# Patient Record
Sex: Female | Born: 1935 | Race: White | Hispanic: No | Smoking: Former smoker
Health system: Southern US, Community
[De-identification: ages and names within clinical notes are randomized; demographics above are authoritative.]

## PROBLEM LIST (undated history)

## (undated) DIAGNOSIS — J189 Pneumonia, unspecified organism: Secondary | ICD-10-CM

## (undated) DIAGNOSIS — T8859XA Other complications of anesthesia, initial encounter: Secondary | ICD-10-CM

## (undated) DIAGNOSIS — R112 Nausea with vomiting, unspecified: Secondary | ICD-10-CM

## (undated) DIAGNOSIS — E079 Disorder of thyroid, unspecified: Secondary | ICD-10-CM

## (undated) DIAGNOSIS — M51369 Other intervertebral disc degeneration, lumbar region without mention of lumbar back pain or lower extremity pain: Secondary | ICD-10-CM

## (undated) DIAGNOSIS — I499 Cardiac arrhythmia, unspecified: Secondary | ICD-10-CM

## (undated) DIAGNOSIS — J40 Bronchitis, not specified as acute or chronic: Secondary | ICD-10-CM

## (undated) DIAGNOSIS — R609 Edema, unspecified: Secondary | ICD-10-CM

## (undated) DIAGNOSIS — R351 Nocturia: Secondary | ICD-10-CM

## (undated) DIAGNOSIS — Z8719 Personal history of other diseases of the digestive system: Secondary | ICD-10-CM

## (undated) DIAGNOSIS — T4145XA Adverse effect of unspecified anesthetic, initial encounter: Secondary | ICD-10-CM

## (undated) DIAGNOSIS — Z9889 Other specified postprocedural states: Secondary | ICD-10-CM

## (undated) DIAGNOSIS — T7840XA Allergy, unspecified, initial encounter: Secondary | ICD-10-CM

## (undated) DIAGNOSIS — D649 Anemia, unspecified: Secondary | ICD-10-CM

## (undated) DIAGNOSIS — G709 Myoneural disorder, unspecified: Secondary | ICD-10-CM

## (undated) DIAGNOSIS — M62830 Muscle spasm of back: Secondary | ICD-10-CM

## (undated) DIAGNOSIS — I2510 Atherosclerotic heart disease of native coronary artery without angina pectoris: Secondary | ICD-10-CM

## (undated) DIAGNOSIS — E039 Hypothyroidism, unspecified: Secondary | ICD-10-CM

## (undated) DIAGNOSIS — F988 Other specified behavioral and emotional disorders with onset usually occurring in childhood and adolescence: Secondary | ICD-10-CM

## (undated) DIAGNOSIS — Z8489 Family history of other specified conditions: Secondary | ICD-10-CM

## (undated) DIAGNOSIS — M1990 Unspecified osteoarthritis, unspecified site: Secondary | ICD-10-CM

## (undated) DIAGNOSIS — E785 Hyperlipidemia, unspecified: Secondary | ICD-10-CM

## (undated) DIAGNOSIS — M5136 Other intervertebral disc degeneration, lumbar region: Secondary | ICD-10-CM

## (undated) HISTORY — DX: Nocturia: R35.1

## (undated) HISTORY — PX: ABDOMINAL HYSTERECTOMY: SHX81

## (undated) HISTORY — DX: Edema, unspecified: R60.9

## (undated) HISTORY — PX: JOINT REPLACEMENT: SHX530

## (undated) HISTORY — DX: Disorder of thyroid, unspecified: E07.9

## (undated) HISTORY — DX: Myoneural disorder, unspecified: G70.9

## (undated) HISTORY — DX: Allergy, unspecified, initial encounter: T78.40XA

## (undated) HISTORY — DX: Other specified behavioral and emotional disorders with onset usually occurring in childhood and adolescence: F98.8

## (undated) HISTORY — PX: CHOLECYSTECTOMY: SHX55

## (undated) HISTORY — PX: FOOT SURGERY: SHX648

## (undated) HISTORY — PX: PARTIAL HYSTERECTOMY: SHX80

## (undated) HISTORY — PX: EYE SURGERY: SHX253

## (undated) HISTORY — PX: COLONOSCOPY: SHX174

## (undated) HISTORY — PX: HAND SURGERY: SHX662

---

## 2011-02-19 ENCOUNTER — Encounter: Payer: Self-pay | Admitting: Podiatry

## 2011-02-19 DIAGNOSIS — E079 Disorder of thyroid, unspecified: Secondary | ICD-10-CM | POA: Insufficient documentation

## 2011-02-19 DIAGNOSIS — F988 Other specified behavioral and emotional disorders with onset usually occurring in childhood and adolescence: Secondary | ICD-10-CM

## 2011-02-19 DIAGNOSIS — L989 Disorder of the skin and subcutaneous tissue, unspecified: Secondary | ICD-10-CM

## 2011-02-19 DIAGNOSIS — H9190 Unspecified hearing loss, unspecified ear: Secondary | ICD-10-CM | POA: Insufficient documentation

## 2011-12-21 ENCOUNTER — Encounter (HOSPITAL_COMMUNITY): Payer: Self-pay

## 2011-12-21 ENCOUNTER — Emergency Department (HOSPITAL_COMMUNITY)
Admission: EM | Admit: 2011-12-21 | Discharge: 2011-12-21 | Disposition: A | Discharge status: Home / Self Care | Payer: Medicare Other | Attending: Emergency Medicine | Admitting: Emergency Medicine

## 2011-12-21 DIAGNOSIS — R21 Rash and other nonspecific skin eruption: Secondary | ICD-10-CM

## 2011-12-21 DIAGNOSIS — M2550 Pain in unspecified joint: Secondary | ICD-10-CM

## 2011-12-21 DIAGNOSIS — Z79899 Other long term (current) drug therapy: Secondary | ICD-10-CM | POA: Insufficient documentation

## 2011-12-21 DIAGNOSIS — F988 Other specified behavioral and emotional disorders with onset usually occurring in childhood and adolescence: Secondary | ICD-10-CM | POA: Insufficient documentation

## 2011-12-21 DIAGNOSIS — E079 Disorder of thyroid, unspecified: Secondary | ICD-10-CM | POA: Insufficient documentation

## 2011-12-21 DIAGNOSIS — H9190 Unspecified hearing loss, unspecified ear: Secondary | ICD-10-CM | POA: Insufficient documentation

## 2011-12-21 DIAGNOSIS — M25469 Effusion, unspecified knee: Secondary | ICD-10-CM | POA: Insufficient documentation

## 2011-12-21 LAB — DIFFERENTIAL
Basophils Relative: 1 % (ref 0–1)
Eosinophils Relative: 4 % (ref 0–5)
Lymphs Abs: 0.9 10*3/uL (ref 0.7–4.0)
Monocytes Absolute: 0.5 10*3/uL (ref 0.1–1.0)
Monocytes Relative: 9 % (ref 3–12)

## 2011-12-21 LAB — COMPREHENSIVE METABOLIC PANEL
ALT: 12 U/L (ref 0–35)
AST: 16 U/L (ref 0–37)
CO2: 22 mEq/L (ref 19–32)
Calcium: 9 mg/dL (ref 8.4–10.5)
Chloride: 106 mEq/L (ref 96–112)
Creatinine, Ser: 0.81 mg/dL (ref 0.50–1.10)
GFR calc Af Amer: 79 mL/min — ABNORMAL LOW (ref >90)
GFR calc non Af Amer: 68 mL/min — ABNORMAL LOW (ref >90)
Glucose, Bld: 91 mg/dL (ref 70–99)
Total Bilirubin: 0.3 mg/dL (ref 0.3–1.2)

## 2011-12-21 LAB — CBC
HCT: 33.9 % — ABNORMAL LOW (ref 36.0–46.0)
Hemoglobin: 12.2 g/dL (ref 12.0–15.0)
MCH: 31.9 pg (ref 26.0–34.0)
MCV: 88.7 fL (ref 78.0–100.0)
RBC: 3.82 MIL/uL — ABNORMAL LOW (ref 3.87–5.11)
WBC: 5.2 10*3/uL (ref 4.0–10.5)

## 2011-12-21 MED ORDER — DOXYCYCLINE HYCLATE 100 MG PO CAPS: 100.0000 mg | ORAL_CAPSULE | Freq: Two times a day (BID) | ORAL | Status: AC

## 2011-12-21 MED ORDER — HYDROCODONE-ACETAMINOPHEN 5-325 MG PO TABS: 1.0000 | ORAL_TABLET | ORAL | Status: AC | PRN

## 2011-12-21 MED ORDER — DOXYCYCLINE HYCLATE 100 MG IV SOLR
100.0000 mg | Freq: Once | INTRAVENOUS | Status: AC
  Administered 2011-12-21: 100 mg via INTRAVENOUS
  Filled 2011-12-21 (×2): qty 100

## 2011-12-21 MED ORDER — IBUPROFEN 200 MG PO TABS
ORAL_TABLET | ORAL | Status: AC
  Administered 2011-12-21: 600 mg
  Filled 2011-12-21: qty 3

## 2011-12-21 MED ORDER — SODIUM CHLORIDE 0.9 % IV BOLUS (SEPSIS)
500.0000 mL | Freq: Once | INTRAVENOUS | Status: AC
  Administered 2011-12-21: 500 mL via INTRAVENOUS

## 2011-12-21 MED ORDER — MORPHINE SULFATE 4 MG/ML IJ SOLN
4.0000 mg | Freq: Once | INTRAMUSCULAR | Status: DC
  Filled 2011-12-21: qty 1

## 2011-12-21 NOTE — Discharge Instructions (Signed)
FOLLOW UP WITH YOUR DOCTOR FOR RECHECK IN 1-2 DAYS. IF YOU DEVELOP ANY FEVER, HEADACHE, NAUSEA/VOMITING, OR IF THE KNEE BECOMES RED AND WARM TO TOUCH WITH CONTINUED OR WORSE PAIN, RETURN HERE IMMEDIATELY. tAKE MEDICATIONS AS PRESCRIBED.   Arthralgia Your caregiver has diagnosed you as suffering from an arthralgia. Arthralgia means there is pain in a joint. This can come from many reasons including:  Bruising the joint which causes soreness (inflammation) in the joint.   Wear and tear on the joints which occur as we grow older (osteoarthritis).   Overusing the joint.   Various forms of arthritis.   Infections of the joint.  Regardless of the cause of pain in your joint, most of these different pains respond to anti-inflammatory drugs and rest. The exception to this is when a joint is infected, and these cases are treated with antibiotics, if it is a bacterial infection. HOME CARE INSTRUCTIONS   Rest the injured area for as long as directed by your caregiver. Then slowly start using the joint as directed by your caregiver and as the pain allows. Crutches as directed may be useful if the ankles, knees or hips are involved. If the knee was splinted or casted, continue use and care as directed. If an stretchy or elastic wrapping bandage has been applied today, it should be removed and re-applied every 3 to 4 hours. It should not be applied tightly, but firmly enough to keep swelling down. Watch toes and feet for swelling, bluish discoloration, coldness, numbness or excessive pain. If any of these problems (symptoms) occur, remove the ace bandage and re-apply more loosely. If these symptoms persist, contact your caregiver or return to this location.   For the first 24 hours, keep the injured extremity elevated on pillows while lying down.   Apply ice for 15 to 20 minutes to the sore joint every couple hours while awake for the first half day. Then 3 to 4 times per day for the first 48 hours. Put the  ice in a plastic bag and place a towel between the bag of ice and your skin.   Wear any splinting, casting, elastic bandage applications, or slings as instructed.   Only take over-the-counter or prescription medicines for pain, discomfort, or fever as directed by your caregiver. Do not use aspirin immediately after the injury unless instructed by your physician. Aspirin can cause increased bleeding and bruising of the tissues.   If you were given crutches, continue to use them as instructed and do not resume weight bearing on the sore joint until instructed.  Persistent pain and inability to use the sore joint as directed for more than 2 to 3 days are warning signs indicating that you should see a caregiver for a follow-up visit as soon as possible. Initially, a hairline fracture (break in bone) may not be evident on X-rays. Persistent pain and swelling indicate that further evaluation, non-weight bearing or use of the joint (use of crutches or slings as instructed), or further X-rays are indicated. X-rays may sometimes not show a small fracture until a week or 10 days later. Make a follow-up appointment with your own caregiver or one to whom we have referred you. A radiologist (specialist in reading X-rays) may read your X-rays. Make sure you know how you are to obtain your X-ray results. Do not assume everything is normal if you do not hear from [place]. SEEK MEDICAL CARE IF: Bruising, swelling, or pain increases. SEEK IMMEDIATE MEDICAL CARE IF:   Your  fingers or toes are numb or blue.   The pain is not responding to medications and continues to stay the same or get worse.   The pain in your joint becomes severe.   You develop a fever over 102 F (38.9 C).   It becomes impossible to move or use the joint.  MAKE SURE YOU:   Understand these instructions.   Will watch your condition.   Will get help right away if you are not doing well or get worse.  Document Released: 07/08/2005 Document  Revised: 06/27/2011 Document Reviewed: 02/24/2008 [facility] Patient Information 2012 [place], [place].

## 2011-12-21 NOTE — Progress Notes (Signed)
Orthopedic Tech Progress Note Patient Details:  Cheryl Martin 1930-12-24 [id number]  Ortho Devices Type of Ortho Device: Crutches Ortho Device/Splint Location: (L) LE Ortho Device/Splint Interventions: Casandra Doffing 12/21/2011, 5:51 PM

## 2011-12-21 NOTE — ED Notes (Signed)
Pt sts she did take a new probiotic last pm.

## 2011-12-21 NOTE — ED Provider Notes (Signed)
Medical screening examination/treatment/procedure(s) were conducted as a shared visit with non-physician practitioner(s) and myself.  I personally evaluated the patient during the encounter    Nelia Shi, MD 12/21/11 [phone]

## 2011-12-21 NOTE — Progress Notes (Signed)
Orthopedic Tech Progress Note Patient Details:  Cheryl Martin [birth year] [id number]  Ortho Devices Type of Ortho Device: Knee Immobilizer Ortho Device/Splint Location: (L) LE Ortho Device/Splint Interventions: Application   Jennye Moccasin 12/21/2011, 4:38 PM

## 2011-12-21 NOTE — ED Notes (Signed)
Pt complains of joint swelling and raash all over since last pm.

## 2011-12-21 NOTE — ED Notes (Signed)
Pt was received to [facility] 1 with c/i swelling to BLE with rash onset yesterday. Pt  Denies any SOB, no chest pain, no dizziness. Pt denies having long travel.. Pt is also complaining of swelling to both hands. NAD

## 2011-12-21 NOTE — ED Provider Notes (Addendum)
History   This chart was scribed for Cheryl Quarry, MD by Toya Smothers. The patient was seen in room STRE1/STRE1. Patient's care was started at 1209.  CSN: [id number]  Arrival date & time 12/21/11  1209   First MD Initiated Contact with Patient 12/21/11 1221     Chief Complaint  Patient presents with  . Joint Swelling  . Rash   Patient is a 78 y.o. female presenting with rash. The history is provided by the patient. No language interpreter was used.  Rash  This is a new problem. The current episode started 12 to 24 hours ago. The problem has been gradually worsening. The problem is associated with nothing. There has been no fever. Associated symptoms comments: She developed rash "all over" and joint pain last night. No fever, vomiting, headache. She reports she does a lot of gardening but not known tick bites. Cheryl Martin is a 78 y.o. female who presents to the Emergency Department complaining of constant moderate severe sudden onset leg swelling onset 14 hours ago with associate rash denying fever. Pt states that it may be a possible tic bite.  Past Medical History  Diagnosis Date  . ADD (attention deficit disorder)   . Hearing loss   . Thyroid disease     Past Surgical History  Procedure Date  . Colonoscopy   . Eye surgery   . Partial hysterectomy   . Hand surgery    No family history on file.  History  Substance Use Topics  . Smoking status: Unknown If Ever Smoked  . Smokeless tobacco: Not on file  . Alcohol Use: No    Review of Systems  Constitutional: Negative for fever.  HENT: Negative for neck pain.   Respiratory: Negative for cough.   Cardiovascular: Negative for chest pain.  Gastrointestinal: Negative for vomiting and abdominal pain.  Musculoskeletal: Positive for joint swelling.  Skin: Positive for rash.  Neurological: Negative for headaches.  Psychiatric/Behavioral: Negative for confusion.    Allergies  Review of patient's allergies  indicates no known allergies.  Home Medications   Current Outpatient Rx  Name Route Sig Dispense Refill  . KRILL OIL PO Oral Take 1 tablet by mouth daily.    . METHYLPHENIDATE HCL 10 MG PO TABS Oral Take 10 mg by mouth 2 (two) times daily. Take along with 5mg  tablet to equal 15mg  total.    . METHYLPHENIDATE HCL 5 MG PO TABS Oral Take 5 mg by mouth 2 (two) times daily. Take with 10mg  tablet to equal 15mg  total dose.    . ADULT MULTIVITAMIN W/MINERALS CH Oral Take 1 tablet by mouth daily.    Marland Kitchen PROBIOTIC PO Oral Take 1 tablet by mouth daily.      BP 107/54  Pulse 79  Temp(Src) 97.7 F (36.5 C) (Oral)  Resp 16  SpO2 97%  Physical Exam  Constitutional: She appears well-developed and well-nourished. No distress.  HENT:  Head: Normocephalic.  Neck: Normal range of motion.  Cardiovascular: Normal rate.   No murmur heard. Pulmonary/Chest: Effort normal. She has no wheezes. She has no rales.  Abdominal: Soft. There is no tenderness.  Musculoskeletal: Normal range of motion.       Left knee swelling without redness or warmth. FROM. Tender.   Skin: Skin is warm and dry.       Macular rash that blanches and is generalized.   Psychiatric: She has a normal mood and affect.    ED Course  Procedures (including critical care time) Results for orders placed during the hospital encounter of 12/21/11  CBC      Component Value Range   WBC 5.2  4.0 - 10.5 (K/uL)   RBC 3.82 (*) 3.87 - 5.11 (MIL/uL)   Hemoglobin 12.2  12.0 - 15.0 (g/dL)   HCT 16.1 (*) 09.6 - 46.0 (%)   MCV 88.7  78.0 - 100.0 (fL)   MCH 31.9  26.0 - 34.0 (pg)   MCHC 36.0  30.0 - 36.0 (g/dL)   RDW 04.5  40.9 - 81.1 (%)   Platelets 212  150 - 400 (K/uL)  DIFFERENTIAL      Component Value Range   Neutrophils Relative 68  43 - 77 (%)   Lymphocytes Relative 18  12 - 46 (%)   Monocytes Relative 9  3 - 12 (%)   Eosinophils Relative 4  0 - 5 (%)   Basophils Relative 1  0 - 1 (%)   Neutro Abs 3.5  1.7 - 7.7 (K/uL)   Lymphs Abs  0.9  0.7 - 4.0 (K/uL)   Monocytes Absolute 0.5  0.1 - 1.0 (K/uL)   Eosinophils Absolute 0.2  0.0 - 0.7 (K/uL)   Basophils Absolute 0.1  0.0 - 0.1 (K/uL)   Smear Review MORPHOLOGY UNREMARKABLE    COMPREHENSIVE METABOLIC PANEL      Component Value Range   Sodium 138  135 - 145 (mEq/L)   Potassium 3.6  3.5 - 5.1 (mEq/L)   Chloride 106  96 - 112 (mEq/L)   CO2 22  19 - 32 (mEq/L)   Glucose, Bld 91  70 - 99 (mg/dL)   BUN 9  6 - 23 (mg/dL)   Creatinine, Ser 9.14  0.50 - 1.10 (mg/dL)   Calcium 9.0  8.4 - 78.2 (mg/dL)   Total Protein 5.9 (*) 6.0 - 8.3 (g/dL)   Albumin 3.6  3.5 - 5.2 (g/dL)   AST 16  0 - 37 (U/L)   ALT 12  0 - 35 (U/L)   Alkaline Phosphatase 86  39 - 117 (U/L)   Total Bilirubin 0.3  0.3 - 1.2 (mg/dL)   GFR calc non Af Amer 68 (*) >90 (mL/min)   GFR calc Af Amer 79 (*) >90 (mL/min)  .  DIAGNOSTIC STUDIES: Oxygen Saturation is 97% on room air, normal by my interpretation.    COORDINATION OF CARE:  Labs Reviewed - No data to display No results found.  No diagnosis found. 1. Arthralgia 2. Rash  MDM  The presentation of arthralgias with typical macular rash is concerning for tick borne illness, despite no fever, headache or vomiting history. Doxycycline given, pain medication. Lab work is unremarkable and Dr. Radford Pax has co-evaluated patient. Feel she is stable for discharge home and is encouraged to follow up with her doctor for recheck in 2 days.        Rodena Medin, PA-C 12/21/11 1507  Rodena Medin, PA-C 12/21/11 1615

## 2011-12-21 NOTE — ED Notes (Signed)
PA at bedside.

## 2012-06-24 ENCOUNTER — Other Ambulatory Visit: Payer: Self-pay | Admitting: Family Medicine

## 2012-06-24 ENCOUNTER — Ambulatory Visit
Admission: RE | Admit: 2012-06-24 | Discharge: 2012-06-24 | Disposition: A | Discharge status: Home / Self Care | Payer: 59 | Source: Ambulatory Visit | Attending: Family Medicine | Admitting: Family Medicine

## 2012-06-24 ENCOUNTER — Ambulatory Visit
Admission: RE | Admit: 2012-06-24 | Discharge: 2012-06-24 | Disposition: A | Discharge status: Home / Self Care | Payer: Medicare Other | Source: Ambulatory Visit | Attending: Family Medicine | Admitting: Family Medicine

## 2012-06-24 DIAGNOSIS — R102 Pelvic and perineal pain: Secondary | ICD-10-CM

## 2014-07-26 DIAGNOSIS — H9313 Tinnitus, bilateral: Secondary | ICD-10-CM | POA: Diagnosis not present

## 2014-07-26 DIAGNOSIS — L659 Nonscarring hair loss, unspecified: Secondary | ICD-10-CM | POA: Diagnosis not present

## 2014-07-26 DIAGNOSIS — R41840 Attention and concentration deficit: Secondary | ICD-10-CM | POA: Diagnosis not present

## 2014-08-03 DIAGNOSIS — J189 Pneumonia, unspecified organism: Secondary | ICD-10-CM | POA: Diagnosis not present

## 2014-08-03 DIAGNOSIS — F900 Attention-deficit hyperactivity disorder, predominantly inattentive type: Secondary | ICD-10-CM | POA: Diagnosis not present

## 2014-08-03 DIAGNOSIS — J13 Pneumonia due to Streptococcus pneumoniae: Secondary | ICD-10-CM | POA: Diagnosis not present

## 2014-08-03 DIAGNOSIS — R41840 Attention and concentration deficit: Secondary | ICD-10-CM | POA: Diagnosis not present

## 2014-09-02 DIAGNOSIS — H903 Sensorineural hearing loss, bilateral: Secondary | ICD-10-CM | POA: Diagnosis not present

## 2014-09-02 DIAGNOSIS — H9313 Tinnitus, bilateral: Secondary | ICD-10-CM | POA: Diagnosis not present

## 2014-09-02 DIAGNOSIS — H9113 Presbycusis, bilateral: Secondary | ICD-10-CM | POA: Diagnosis not present

## 2014-09-19 DIAGNOSIS — L821 Other seborrheic keratosis: Secondary | ICD-10-CM | POA: Diagnosis not present

## 2014-09-19 DIAGNOSIS — D1801 Hemangioma of skin and subcutaneous tissue: Secondary | ICD-10-CM | POA: Diagnosis not present

## 2014-09-19 DIAGNOSIS — L820 Inflamed seborrheic keratosis: Secondary | ICD-10-CM | POA: Diagnosis not present

## 2014-09-19 DIAGNOSIS — D2262 Melanocytic nevi of left upper limb, including shoulder: Secondary | ICD-10-CM | POA: Diagnosis not present

## 2014-10-24 DIAGNOSIS — H04123 Dry eye syndrome of bilateral lacrimal glands: Secondary | ICD-10-CM | POA: Diagnosis not present

## 2014-10-24 DIAGNOSIS — H3562 Retinal hemorrhage, left eye: Secondary | ICD-10-CM | POA: Diagnosis not present

## 2014-10-24 DIAGNOSIS — H1859 Other hereditary corneal dystrophies: Secondary | ICD-10-CM | POA: Diagnosis not present

## 2014-10-24 DIAGNOSIS — H35372 Puckering of macula, left eye: Secondary | ICD-10-CM | POA: Diagnosis not present

## 2014-11-01 DIAGNOSIS — H35372 Puckering of macula, left eye: Secondary | ICD-10-CM | POA: Diagnosis not present

## 2014-11-02 DIAGNOSIS — H35372 Puckering of macula, left eye: Secondary | ICD-10-CM | POA: Diagnosis not present

## 2014-11-02 DIAGNOSIS — H547 Unspecified visual loss: Secondary | ICD-10-CM | POA: Diagnosis not present

## 2014-11-10 DIAGNOSIS — H35372 Puckering of macula, left eye: Secondary | ICD-10-CM | POA: Diagnosis not present

## 2014-11-14 DIAGNOSIS — F900 Attention-deficit hyperactivity disorder, predominantly inattentive type: Secondary | ICD-10-CM | POA: Diagnosis not present

## 2014-12-22 DIAGNOSIS — H35372 Puckering of macula, left eye: Secondary | ICD-10-CM | POA: Diagnosis not present

## 2014-12-22 DIAGNOSIS — Z09 Encounter for follow-up examination after completed treatment for conditions other than malignant neoplasm: Secondary | ICD-10-CM | POA: Diagnosis not present

## 2015-02-13 DIAGNOSIS — F909 Attention-deficit hyperactivity disorder, unspecified type: Secondary | ICD-10-CM | POA: Diagnosis not present

## 2015-02-13 DIAGNOSIS — E782 Mixed hyperlipidemia: Secondary | ICD-10-CM | POA: Diagnosis not present

## 2015-02-13 DIAGNOSIS — Z5181 Encounter for therapeutic drug level monitoring: Secondary | ICD-10-CM | POA: Diagnosis not present

## 2015-02-13 DIAGNOSIS — I2510 Atherosclerotic heart disease of native coronary artery without angina pectoris: Secondary | ICD-10-CM | POA: Diagnosis not present

## 2015-02-13 DIAGNOSIS — G894 Chronic pain syndrome: Secondary | ICD-10-CM | POA: Diagnosis not present

## 2015-02-16 ENCOUNTER — Emergency Department (INDEPENDENT_AMBULATORY_CARE_PROVIDER_SITE_OTHER)
Admission: EM | Admit: 2015-02-16 | Discharge: 2015-02-16 | Disposition: A | Discharge status: Home / Self Care | Payer: Medicare Other | Source: Home / Self Care

## 2015-02-16 ENCOUNTER — Encounter (HOSPITAL_COMMUNITY): Payer: Self-pay | Admitting: Emergency Medicine

## 2015-02-16 DIAGNOSIS — M1712 Unilateral primary osteoarthritis, left knee: Secondary | ICD-10-CM | POA: Diagnosis not present

## 2015-02-16 MED ORDER — TRAMADOL HCL 50 MG PO TABS: 50.0000 mg | ORAL_TABLET | Freq: Four times a day (QID) | ORAL | Status: DC | PRN

## 2015-02-16 NOTE — ED Provider Notes (Signed)
CSN: [id number]     Arrival date & time 02/16/15  1427 History   None    Chief Complaint  Patient presents with  . Leg Pain   (Consider location/radiation/quality/duration/timing/severity/associated sxs/prior Treatment)  HPI   The patient is an 81 year old female presenting today with complaints of increased L knee pain for the past 2-3 days.  The patient states she has had knee trouble for "years", but that it has been increasingly uncomfortable over the past few days.  Patient denies any known injury. Denies any significant medical history. Her only medications are adderal. No known allergies.  Past Medical History  Diagnosis Date  . ADD (attention deficit disorder)   . Hearing loss   . Thyroid disease    Past Surgical History  Procedure Laterality Date  . Colonoscopy    . Eye surgery    . Partial hysterectomy    . Hand surgery     History reviewed. No pertinent family history. History  Substance Use Topics  . Smoking status: Unknown If Ever Smoked  . Smokeless tobacco: Not on file  . Alcohol Use: No   OB History    No data available     Review of Systems  Constitutional: Negative.  Negative for fever and chills.  HENT: Negative.  Negative for sinus pressure, sneezing and sore throat.   Eyes: Negative.   Respiratory: Negative for cough, chest tightness, shortness of breath and wheezing.   Cardiovascular: Negative.  Negative for chest pain.  Gastrointestinal: Negative.   Endocrine: Negative.   Genitourinary: Negative.   Musculoskeletal: Positive for gait problem. Negative for neck pain and neck stiffness.       L Knee pain.   Skin: Negative.  Negative for pallor and rash.  Allergic/Immunologic: Negative for environmental allergies, food allergies and immunocompromised state.  Neurological: Negative.   Hematological: Negative.   Psychiatric/Behavioral: Negative.     Allergies  Review of patient's allergies indicates no known allergies.  Home Medications    Prior to Admission medications   Medication Sig Start Date End Date Taking? Authorizing Provider  KRILL OIL PO Take 1 tablet by mouth daily.    Historical Provider, MD  methylphenidate (RITALIN) 10 MG tablet Take 10 mg by mouth 2 (two) times daily. Take along with 5mg  tablet to equal 15mg  total.    Historical Provider, MD  methylphenidate (RITALIN) 5 MG tablet Take 5 mg by mouth 2 (two) times daily. Take with 10mg  tablet to equal 15mg  total dose.    Historical Provider, MD  Multiple Vitamin (MULITIVITAMIN WITH MINERALS) TABS Take 1 tablet by mouth daily.    Historical Provider, MD  Probiotic Product (PROBIOTIC PO) Take 1 tablet by mouth daily.    Historical Provider, MD  traMADol (ULTRAM) 50 MG tablet Take 1 tablet (50 mg total) by mouth every 6 (six) hours as needed. 02/16/15   Nehemiah Settle, NP   BP 177/80 mmHg  Pulse 78  Temp(Src) 97.5 F (36.4 C) (Oral)  Resp 16  SpO2 98%   Physical Exam  Constitutional: She appears well-developed and well-nourished. No distress.  Eyes: Conjunctivae are normal. Pupils are equal, round, and reactive to light. Right eye exhibits no discharge. Left eye exhibits no discharge. No scleral icterus.  Neck: Normal range of motion. Neck supple. No thyromegaly present.  Cardiovascular: Normal rate, regular rhythm, normal heart sounds and intact distal pulses.  Exam reveals no gallop and no friction rub.   No murmur heard. Bilateral dependent/pedal edema present.  2+ pedal pulses bilaterally.  CMS intact and cap refill <3 seconds.   Pulmonary/Chest: Effort normal and breath sounds normal. No respiratory distress. She has no wheezes. She has no rales. She exhibits no tenderness.  Musculoskeletal: Normal range of motion. She exhibits edema and tenderness.       Left knee: She exhibits swelling and effusion. She exhibits normal range of motion, no ecchymosis, no deformity, no laceration, no erythema, normal alignment, no LCL laxity, normal patellar mobility and  no bony tenderness. Tenderness found.       Legs: Crepitus noted with flexion and extension.  States history of "popping" knee when she was younger.  Scar noted over knee from fall in the 1950s.  Skin: She is not diaphoretic.  Nursing note and vitals reviewed.   ED Course  Procedures (including critical care time) Labs Review Labs Reviewed - No data to display  Imaging Review No results found.   MDM   1. Primary osteoarthritis of left knee    Meds ordered this encounter  Medications  . traMADol (ULTRAM) 50 MG tablet    Sig: Take 1 tablet (50 mg total) by mouth every 6 (six) hours as needed.    Dispense:  10 tablet    Refill:  0    Plan of care was discussed with Dr. Juventino Slovak.  The patient placed in knee immobilizer and to follow up with [facility] for further evaluation. The patient verbalizes understanding and agrees to plan of care.      Nehemiah Settle, NP 02/16/15 1812

## 2015-02-16 NOTE — ED Notes (Signed)
C/o left leg pain States she has some swelling Denies any injury States she pcp two days ago and was told electrolytes was low to drink Gatorade Does have hx of arthritis

## 2015-02-16 NOTE — Discharge Instructions (Signed)
Arthritis, Nonspecific °Arthritis is inflammation of a joint. This usually means pain, redness, warmth or swelling are present. One or more joints may be involved. There are a number of types of arthritis. Your caregiver may not be able to tell what type of arthritis you have right away. °CAUSES  °The most common cause of arthritis is the wear and tear on the joint (osteoarthritis). This causes damage to the cartilage, which can break down over time. The knees, hips, back and neck are most often affected by this type of arthritis. °Other types of arthritis and common causes of joint pain include: °· Sprains and other injuries near the joint. Sometimes minor sprains and injuries cause pain and swelling that develop hours later. °· Rheumatoid arthritis. This affects hands, feet and knees. It usually affects both sides of your body at the same time. It is often associated with chronic ailments, fever, weight loss and general weakness. °· Crystal arthritis. Gout and pseudo gout can cause occasional acute severe pain, redness and swelling in the foot, ankle, or knee. °· Infectious arthritis. Bacteria can get into a joint through a break in overlying skin. This can cause infection of the joint. Bacteria and viruses can also spread through the blood and affect your joints. °· Drug, infectious and allergy reactions. Sometimes joints can become mildly painful and slightly swollen with these types of illnesses. °SYMPTOMS  °· Pain is the main symptom. °· Your joint or joints can also be red, swollen and warm or hot to the touch. °· You may have a fever with certain types of arthritis, or even feel overall ill. °· The joint with arthritis will hurt with movement. Stiffness is present with some types of arthritis. °DIAGNOSIS  °Your caregiver will suspect arthritis based on your description of your symptoms and on your exam. Testing may be needed to find the type of arthritis: °· Blood and sometimes urine tests. °· X-ray tests  and sometimes CT or MRI scans. °· Removal of fluid from the joint (arthrocentesis) is done to check for bacteria, crystals or other causes. Your caregiver (or a specialist) will numb the area over the joint with a local anesthetic, and use a needle to remove joint fluid for examination. This procedure is only minimally uncomfortable. °· Even with these tests, your caregiver may not be able to tell what kind of arthritis you have. Consultation with a specialist (rheumatologist) may be helpful. °TREATMENT  °Your caregiver will discuss with you treatment specific to your type of arthritis. If the specific type cannot be determined, then the following general recommendations may apply. °Treatment of severe joint pain includes: °· Rest. °· Elevation. °· Anti-inflammatory medication (for example, ibuprofen) may be prescribed. Avoiding activities that cause increased pain. °· Only take over-the-counter or prescription medicines for pain and discomfort as recommended by your caregiver. °· Cold packs over an inflamed joint may be used for 10 to 15 minutes every hour. Hot packs sometimes feel better, but do not use overnight. Do not use hot packs if you are diabetic without your caregiver's permission. °· A cortisone shot into arthritic joints may help reduce pain and swelling. °· Any acute arthritis that gets worse over the next 1 to 2 days needs to be looked at to be sure there is no joint infection. °Long-term arthritis treatment involves modifying activities and lifestyle to reduce joint stress jarring. This can include weight loss. Also, exercise is needed to nourish the joint cartilage and remove waste. This helps keep the muscles   around the joint strong. °HOME CARE INSTRUCTIONS  °· Do not take aspirin to relieve pain if gout is suspected. This elevates uric acid levels. °· Only take over-the-counter or prescription medicines for pain, discomfort or fever as directed by your caregiver. °· Rest the joint as much as  possible. °· If your joint is swollen, keep it elevated. °· Use crutches if the painful joint is in your leg. °· Drinking plenty of fluids may help for certain types of arthritis. °· Follow your caregiver's dietary instructions. °· Try low-impact exercise such as: °¨ Swimming. °¨ Water aerobics. °¨ Biking. °¨ Walking. °· Morning stiffness is often relieved by a warm shower. °· Put your joints through regular range-of-motion. °SEEK MEDICAL CARE IF:  °· You do not feel better in 24 hours or are getting worse. °· You have side effects to medications, or are not getting better with treatment. °SEEK IMMEDIATE MEDICAL CARE IF:  °· You have a fever. °· You develop severe joint pain, swelling or redness. °· Many joints are involved and become painful and swollen. °· There is severe back pain and/or leg weakness. °· You have loss of bowel or bladder control. °Document Released: 08/15/2004 Document Revised: 09/30/2011 Document Reviewed: 08/31/2008 °[employer]® Patient Information ©2015 [employer]. This information is not intended to replace advice given to you by your health care provider. Make sure you discuss any questions you have with your health care provider. ° °

## 2015-02-23 DIAGNOSIS — H35372 Puckering of macula, left eye: Secondary | ICD-10-CM | POA: Diagnosis not present

## 2015-02-23 DIAGNOSIS — M1712 Unilateral primary osteoarthritis, left knee: Secondary | ICD-10-CM | POA: Diagnosis not present

## 2015-03-14 ENCOUNTER — Other Ambulatory Visit: Payer: Self-pay | Admitting: *Deleted

## 2015-03-14 DIAGNOSIS — I872 Venous insufficiency (chronic) (peripheral): Secondary | ICD-10-CM

## 2015-03-14 DIAGNOSIS — I739 Peripheral vascular disease, unspecified: Secondary | ICD-10-CM

## 2015-04-06 DIAGNOSIS — M1712 Unilateral primary osteoarthritis, left knee: Secondary | ICD-10-CM | POA: Diagnosis not present

## 2015-04-17 ENCOUNTER — Encounter: Payer: Self-pay | Admitting: Vascular Surgery

## 2015-04-19 ENCOUNTER — Ambulatory Visit (INDEPENDENT_AMBULATORY_CARE_PROVIDER_SITE_OTHER)
Admission: RE | Admit: 2015-04-19 | Discharge: 2015-04-19 | Disposition: A | Discharge status: Home / Self Care | Payer: Medicare Other | Source: Ambulatory Visit | Attending: Vascular Surgery | Admitting: Vascular Surgery

## 2015-04-19 ENCOUNTER — Ambulatory Visit (HOSPITAL_COMMUNITY)
Admission: RE | Admit: 2015-04-19 | Discharge: 2015-04-19 | Disposition: A | Discharge status: Home / Self Care | Payer: Medicare Other | Source: Ambulatory Visit | Attending: Vascular Surgery | Admitting: Vascular Surgery

## 2015-04-19 ENCOUNTER — Encounter: Payer: Self-pay | Admitting: Vascular Surgery

## 2015-04-19 ENCOUNTER — Ambulatory Visit (INDEPENDENT_AMBULATORY_CARE_PROVIDER_SITE_OTHER): Payer: Medicare Other | Admitting: Vascular Surgery

## 2015-04-19 VITALS — BP 134/67 | HR 72 | Ht 64.0 in | Wt 143.7 lb

## 2015-04-19 DIAGNOSIS — I872 Venous insufficiency (chronic) (peripheral): Secondary | ICD-10-CM | POA: Insufficient documentation

## 2015-04-19 DIAGNOSIS — I739 Peripheral vascular disease, unspecified: Secondary | ICD-10-CM | POA: Insufficient documentation

## 2015-04-19 NOTE — Progress Notes (Signed)
[facility] [place]  Patient name: Cheryl Martin MRN: [id number] DOB: April 02, 1931 Sex: female  REASON FOR CONSULT: Bilateral lower extremity swelling. Referred by Dr. Noemi Chapel.  HPI: Cheryl Martin is a 81 y.o. female, who is Married, and who presents with bilateral lower extremity swelling. This has been present for many months. Her symptoms are aggravated by standing and relieved somewhat with elevation. She is unaware of any history of DVT or phlebitis. She does have osteoarthritis of her left knee and does have pain associated with this. There is no significant pain associated with her lower leg swelling.  I have reviewed the records that were sent from Dr. Archie Endo office. She presented with pain and swelling of her left knee with no injury associated with this. She was felt to have some left knee osteoarthritis. She also had changes bilaterally consistent with venous stasis and was sent for vascular consultation.  Past Medical History  Diagnosis Date  . ADD (attention deficit disorder)   . Hearing loss   . Thyroid disease    Family History  Problem Relation Age of Onset  . Cancer Mother    SOCIAL HISTORY: Social History   Social History  . Marital Status: Married    Spouse Name: N/A  . Number of Children: N/A  . Years of Education: N/A   Occupational History  . Not on file.   Social History Main Topics  . Smoking status: Former Smoker    Quit date: 07/23/1971  . Smokeless tobacco: Not on file  . Alcohol Use: No  . Drug Use: Not on file  . Sexual Activity: Not on file   Other Topics Concern  . Not on file   Social History Narrative   No Known Allergies Current Outpatient Prescriptions  Medication Sig Dispense Refill  . amphetamine-dextroamphetamine (ADDERALL) 20 MG tablet Take 20 mg by mouth daily.    . Ascorbic Acid (VITAMIN C) 1000 MG tablet Take 1,000 mg by mouth daily.    Marland Kitchen FISH OIL-KRILL OIL PO Take by mouth.    . Multiple  Vitamin (MULITIVITAMIN WITH MINERALS) TABS Take 1 tablet by mouth daily.    . VOLTAREN 1 % GEL     . methylphenidate (RITALIN) 10 MG tablet Take 10 mg by mouth 2 (two) times daily. Take along with 5mg  tablet to equal 15mg  total.    . methylphenidate (RITALIN) 5 MG tablet Take 5 mg by mouth 2 (two) times daily. Take with 10mg  tablet to equal 15mg  total dose.    . Probiotic Product (PROBIOTIC PO) Take 1 tablet by mouth daily.    . traMADol (ULTRAM) 50 MG tablet Take 1 tablet (50 mg total) by mouth every 6 (six) hours as needed. (Patient not taking: Reported on 04/19/2015) 10 tablet 0   No current facility-administered medications for this visit.   REVIEW OF SYSTEMS:  [X]  denotes positive finding, [ ]  denotes negative finding Cardiac  Comments:  Chest pain or chest pressure:    Shortness of breath upon exertion:    Short of breath when lying flat:    Irregular heart rhythm:        Vascular    Pain in calf, thigh, or hip brought on by ambulation:    Pain in feet at night that wakes you up from your sleep:   Cramping but no rest pain.  Blood clot in your veins:    Leg swelling:  X       Pulmonary  Oxygen at home:    Productive cough:     Wheezing:         Neurologic    Sudden weakness in arms or legs:     Sudden numbness in arms or legs:     Sudden onset of difficulty speaking or slurred speech:    Temporary loss of vision in one eye:     Problems with dizziness:         Gastrointestinal    Blood in stool:     Vomited blood:         Genitourinary    Burning when urinating:     Blood in urine:        Psychiatric    Major depression:         Hematologic    Bleeding problems:    Problems with blood clotting too easily:        Skin    Rashes or ulcers:        Constitutional    Fever or chills:      PHYSICAL EXAM: Filed Vitals:   04/19/15 1037 04/19/15 1045  BP: 142/67 134/67  Pulse: 72   Height: 5\' 4"  (1.626 m)   Weight: 143 lb 11.2 oz (65.182 kg)   SpO2: 100%     GENERAL: The patient is a well-nourished female, in no acute distress. The vital signs are documented above. CARDIAC: There is a regular rate and rhythm.  VASCULAR: I do not detect carotid bruits. She has palpable femoral pulses and diminished but palpable dorsalis pedis pulses bilaterally. Both feet are warm and well perfused. She has mild bilateral lower extremity swelling. There are no significant varicose veins. PULMONARY: There is good air exchange bilaterally without wheezing or rales. ABDOMEN: Soft and non-tender with normal pitched bowel sounds.  MUSCULOSKELETAL: There are no major deformities or cyanosis. NEUROLOGIC: No focal weakness or paresthesias are detected. SKIN: There are no ulcers or rashes noted. She has mild hyperpigmentation bilaterally consistent with chronic venous insufficiency. PSYCHIATRIC: The patient has a normal affect.  DATA:  LOWER EXTREMITY VENOUS DUPLEX: I have independently interpreted the lower extremely venous duplex scan today.  On the right side, there is no evidence of DVT. There is reflux in the right common femoral vein. There is no reflux in the right great saphenous vein.  On the left side. There is no evidence of DVT. There is reflux in the left common femoral vein and also in the saphenofemoral junction. There is no reflux in the left great saphenous vein.  LOWER EXTREMITY ARTERIAL DOPPLER: I have independently interpreted the lower history arterial Doppler study which was done today and shows triphasic Doppler signals in the posterior tibial and dorsalis pedis positions bilaterally. ABIs are 100% bilaterally.  MEDICAL ISSUES:  CHRONIC VENOUS INSUFFICIENCY: Based on her duplex and exam, she has evidence of mild chronic venous insufficiency bilaterally. We have discussed the importance of intermittent leg elevation in the proper positioning for this. In addition I have given her a prescription for knee-high compression stockings with a gradient of  15-20 mmHg. I've encouraged her to stay active and to avoid prolonged sitting and standing. I'll be happy to see her back at anytime if her swelling progresses or she develops any new vascular issues. I reassured her that she has excellent arterial flow.   Deitra Mayo [facility] [place]: [phone]

## 2015-05-03 DIAGNOSIS — H04123 Dry eye syndrome of bilateral lacrimal glands: Secondary | ICD-10-CM | POA: Diagnosis not present

## 2015-05-03 DIAGNOSIS — Z961 Presence of intraocular lens: Secondary | ICD-10-CM | POA: Diagnosis not present

## 2015-05-03 DIAGNOSIS — H1859 Other hereditary corneal dystrophies: Secondary | ICD-10-CM | POA: Diagnosis not present

## 2015-06-05 DIAGNOSIS — R634 Abnormal weight loss: Secondary | ICD-10-CM | POA: Diagnosis not present

## 2015-06-05 DIAGNOSIS — R05 Cough: Secondary | ICD-10-CM | POA: Diagnosis not present

## 2015-06-09 ENCOUNTER — Ambulatory Visit
Admission: RE | Admit: 2015-06-09 | Discharge: 2015-06-09 | Disposition: A | Discharge status: Home / Self Care | Payer: Medicare Other | Source: Ambulatory Visit | Attending: Family Medicine | Admitting: Family Medicine

## 2015-06-09 ENCOUNTER — Other Ambulatory Visit: Payer: Self-pay | Admitting: Family Medicine

## 2015-06-09 DIAGNOSIS — R634 Abnormal weight loss: Secondary | ICD-10-CM

## 2015-06-09 DIAGNOSIS — J40 Bronchitis, not specified as acute or chronic: Secondary | ICD-10-CM

## 2015-06-09 DIAGNOSIS — R079 Chest pain, unspecified: Secondary | ICD-10-CM | POA: Diagnosis not present

## 2015-06-09 DIAGNOSIS — R0789 Other chest pain: Secondary | ICD-10-CM

## 2015-06-28 DIAGNOSIS — R799 Abnormal finding of blood chemistry, unspecified: Secondary | ICD-10-CM | POA: Diagnosis not present

## 2015-06-28 DIAGNOSIS — K5900 Constipation, unspecified: Secondary | ICD-10-CM | POA: Diagnosis not present

## 2015-06-28 DIAGNOSIS — E039 Hypothyroidism, unspecified: Secondary | ICD-10-CM | POA: Diagnosis not present

## 2015-06-28 DIAGNOSIS — I10 Essential (primary) hypertension: Secondary | ICD-10-CM | POA: Diagnosis not present

## 2015-06-28 DIAGNOSIS — N3281 Overactive bladder: Secondary | ICD-10-CM | POA: Diagnosis not present

## 2015-07-03 DIAGNOSIS — H1859 Other hereditary corneal dystrophies: Secondary | ICD-10-CM | POA: Diagnosis not present

## 2015-07-03 DIAGNOSIS — Z961 Presence of intraocular lens: Secondary | ICD-10-CM | POA: Diagnosis not present

## 2015-07-03 DIAGNOSIS — H04123 Dry eye syndrome of bilateral lacrimal glands: Secondary | ICD-10-CM | POA: Diagnosis not present

## 2015-07-03 DIAGNOSIS — H53001 Unspecified amblyopia, right eye: Secondary | ICD-10-CM | POA: Diagnosis not present

## 2015-07-03 DIAGNOSIS — H35372 Puckering of macula, left eye: Secondary | ICD-10-CM | POA: Diagnosis not present

## 2015-09-18 DIAGNOSIS — H04123 Dry eye syndrome of bilateral lacrimal glands: Secondary | ICD-10-CM | POA: Diagnosis not present

## 2015-09-18 DIAGNOSIS — Z961 Presence of intraocular lens: Secondary | ICD-10-CM | POA: Diagnosis not present

## 2015-09-18 DIAGNOSIS — H1859 Other hereditary corneal dystrophies: Secondary | ICD-10-CM | POA: Diagnosis not present

## 2015-09-18 DIAGNOSIS — H16102 Unspecified superficial keratitis, left eye: Secondary | ICD-10-CM | POA: Diagnosis not present

## 2015-09-18 DIAGNOSIS — H10412 Chronic giant papillary conjunctivitis, left eye: Secondary | ICD-10-CM | POA: Diagnosis not present

## 2015-11-13 DIAGNOSIS — I2510 Atherosclerotic heart disease of native coronary artery without angina pectoris: Secondary | ICD-10-CM | POA: Diagnosis not present

## 2015-11-13 DIAGNOSIS — E782 Mixed hyperlipidemia: Secondary | ICD-10-CM | POA: Diagnosis not present

## 2015-11-13 DIAGNOSIS — F909 Attention-deficit hyperactivity disorder, unspecified type: Secondary | ICD-10-CM | POA: Diagnosis not present

## 2016-02-15 DIAGNOSIS — R41840 Attention and concentration deficit: Secondary | ICD-10-CM | POA: Diagnosis not present

## 2016-02-15 DIAGNOSIS — G459 Transient cerebral ischemic attack, unspecified: Secondary | ICD-10-CM | POA: Diagnosis not present

## 2016-02-17 ENCOUNTER — Other Ambulatory Visit: Payer: Self-pay | Admitting: Family Medicine

## 2016-02-17 DIAGNOSIS — G459 Transient cerebral ischemic attack, unspecified: Secondary | ICD-10-CM

## 2016-02-21 ENCOUNTER — Encounter: Payer: Self-pay | Admitting: Family Medicine

## 2016-02-21 ENCOUNTER — Ambulatory Visit (INDEPENDENT_AMBULATORY_CARE_PROVIDER_SITE_OTHER): Payer: Medicare Other | Admitting: Family Medicine

## 2016-02-21 DIAGNOSIS — F988 Other specified behavioral and emotional disorders with onset usually occurring in childhood and adolescence: Secondary | ICD-10-CM

## 2016-02-21 DIAGNOSIS — E559 Vitamin D deficiency, unspecified: Secondary | ICD-10-CM

## 2016-02-21 DIAGNOSIS — H9193 Unspecified hearing loss, bilateral: Secondary | ICD-10-CM

## 2016-02-21 DIAGNOSIS — F909 Attention-deficit hyperactivity disorder, unspecified type: Secondary | ICD-10-CM | POA: Diagnosis not present

## 2016-02-21 DIAGNOSIS — Z90710 Acquired absence of both cervix and uterus: Secondary | ICD-10-CM | POA: Diagnosis not present

## 2016-02-21 DIAGNOSIS — E785 Hyperlipidemia, unspecified: Secondary | ICD-10-CM

## 2016-02-21 DIAGNOSIS — E079 Disorder of thyroid, unspecified: Secondary | ICD-10-CM

## 2016-02-21 DIAGNOSIS — R5382 Chronic fatigue, unspecified: Secondary | ICD-10-CM

## 2016-02-21 DIAGNOSIS — E538 Deficiency of other specified B group vitamins: Secondary | ICD-10-CM

## 2016-02-21 NOTE — Progress Notes (Signed)
New patient office visit note:  Impression and Recommendations:    1. ADD (attention deficit disorder)   2. Status post hysterectomy    3. thyroid disease- at time did not warrant txmnt   4. Vitamin D deficiency   5. Decreased hearing, bilateral   6. h/o Hyperlipidemia, mild   7. h/o low B12- on supp   8. Chronic fatigue      Since medication is not having any detrimental effects,  and patient claims it makes a significant difference in her ability to focus, complete tasks at home, and just feels overall more in control of her life, explained patient I'm okay with refilling that as needed in the future  Due to age and past medical history, we discussed it is doubtful that she will need any more Pap smears in the future  Check thyroid levels at upcoming lab draw  We'll check vitamin D levels    patient declines referral for hearing eval.  We'll obtain yearly health maintenance blood work in the future and include vitamin D, B12.    Patient's Medications  New Prescriptions   No medications on file  Previous Medications   AMPHETAMINE-DEXTROAMPHETAMINE (ADDERALL) 20 MG TABLET    Take 20 mg by mouth 2 (two) times daily.   ASCORBIC ACID (VITAMIN C) 1000 MG TABLET    Take 1,000 mg by mouth daily.   CARNITINE 250 MG CAPS    Take 2 capsules by mouth daily.   CHOLECALCIFEROL (VITAMIN D3) 2000 UNITS TABS    Take 1 capsule by mouth daily.   FISH OIL-KRILL OIL PO    Take by mouth.   MISC NATURAL PRODUCTS (TART CHERRY ADVANCED PO)    Take 1,200 mg by mouth daily.   MULTIPLE VITAMIN (MULITIVITAMIN WITH MINERALS) TABS    Take 1 tablet by mouth daily.   TURMERIC 500 MG CAPS    Take 1 tablet by mouth daily.  Modified Medications   No medications on file  Discontinued Medications   AMPHETAMINE-DEXTROAMPHETAMINE (ADDERALL) 20 MG TABLET    Take 20 mg by mouth daily.   METHYLPHENIDATE (RITALIN) 10 MG TABLET    Take 10 mg by mouth 2 (two) times daily. Take along with 5mg  tablet to  equal 15mg  total.   METHYLPHENIDATE (RITALIN) 5 MG TABLET    Take 5 mg by mouth 2 (two) times daily. Take with 10mg  tablet to equal 15mg  total dose.   PROBIOTIC PRODUCT (PROBIOTIC PO)    Take 1 tablet by mouth daily.   TRAMADOL (ULTRAM) 50 MG TABLET    Take 1 tablet (50 mg total) by mouth every 6 (six) hours as needed.   VOLTAREN 1 % GEL        Return in about 4 weeks (around 03/20/2016) for Fasting blood work, For wellness exam & health maintenance evaluation.  The patient was counseled, risk factors were discussed, anticipatory guidance given.  Gross side effects, risk and benefits, and alternatives of medications discussed with patient.  Patient is aware that all medications have potential side effects and we are unable to predict every side effect or drug-drug interaction that may occur.  Expresses verbal understanding and consents to current therapy plan and treatment regimen.  Please see AVS handed out to patient at the end of our visit for further patient instructions/ counseling done pertaining to today's office visit.    Note: This document was prepared using Dragon voice recognition software and may include unintentional dictation errors.  ----------------------------------------------------------------------------------------------------------------------  Subjective:    No chief complaint on file.   HPI: Cheryl Martin is an 82 y.o. female who presents to [place] at [facility] today to review their medical history with me and establish care.     Pt of dr little- many yrs.  Looking for a change.    Dr Zadie Rhine- Optho- double vision L eye. Sx repair of "a macular wrinkle."    Last blood work was at least a yr ago.    2015 patient had a normal Pap smear and is not sexually active. Also she had a normal mammogram at that time. She denies any family history of breast cancer or female cancers of any kind.   Patient Active Problem List   Diagnosis  Date Noted  . h/o Hyperlipidemia, mild 02/24/2016  . h/o low B12- on supp 02/24/2016  . Gen chronic Fatigue 02/24/2016  . Vitamin D deficiency 02/22/2016  . Status post hysterectomy  02/21/2016  . ADD (attention deficit disorder) 02/19/2011  . Decreased hearing 02/19/2011  . thyroid disease- at time did not warrant txmnt 02/19/2011     Past Medical History:  Diagnosis Date  . ADD (attention deficit disorder)   . Allergy   . Edema   . Hearing loss   . Neuromuscular disorder ([place])   . Nocturia   . Thyroid disease      Past Surgical History:  Procedure Laterality Date  . ABDOMINAL HYSTERECTOMY    . CHOLECYSTECTOMY    . COLONOSCOPY    . EYE SURGERY    . HAND SURGERY    . PARTIAL HYSTERECTOMY       Family History  Problem Relation Age of Onset  . Cancer Brother     stomach, colon     History  Drug Use No    History  Alcohol Use No    History  Smoking Status  . Former Smoker  . Quit date: 07/23/1971  Smokeless Tobacco  . Never Used    Patient's Medications  New Prescriptions   No medications on file  Previous Medications   AMPHETAMINE-DEXTROAMPHETAMINE (ADDERALL) 20 MG TABLET    Take 20 mg by mouth 2 (two) times daily.   ASCORBIC ACID (VITAMIN C) 1000 MG TABLET    Take 1,000 mg by mouth daily.   CARNITINE 250 MG CAPS    Take 2 capsules by mouth daily.   CHOLECALCIFEROL (VITAMIN D3) 2000 UNITS TABS    Take 1 capsule by mouth daily.   FISH OIL-KRILL OIL PO    Take by mouth.   MISC NATURAL PRODUCTS (TART CHERRY ADVANCED PO)    Take 1,200 mg by mouth daily.   MULTIPLE VITAMIN (MULITIVITAMIN WITH MINERALS) TABS    Take 1 tablet by mouth daily.   TURMERIC 500 MG CAPS    Take 1 tablet by mouth daily.  Modified Medications   No medications on file  Discontinued Medications   AMPHETAMINE-DEXTROAMPHETAMINE (ADDERALL) 20 MG TABLET    Take 20 mg by mouth daily.   METHYLPHENIDATE (RITALIN) 10 MG TABLET    Take 10 mg by mouth 2 (two) times daily. Take along with  5mg  tablet to equal 15mg  total.   METHYLPHENIDATE (RITALIN) 5 MG TABLET    Take 5 mg by mouth 2 (two) times daily. Take with 10mg  tablet to equal 15mg  total dose.   PROBIOTIC PRODUCT (PROBIOTIC PO)    Take 1 tablet by mouth daily.   TRAMADOL (ULTRAM) 50 MG TABLET  Take 1 tablet (50 mg total) by mouth every 6 (six) hours as needed.   VOLTAREN 1 % GEL        Allergies: Review of patient's allergies indicates no known allergies.  Review of Systems:   ( Completed via Adult Medical History Intake form today ) General:  Denies fever, chills, appetite changes, unexplained weight loss.  Optho/Auditory:   Denies visual changes, blurred vision/LOV, ringing in ears/ diff hearing Respiratory:   Denies SOB, DOE, cough, wheezing.  Cardiovascular:   Denies chest pain, palpitations, new onset peripheral edema  Gastrointestinal:   Denies nausea, vomiting, diarrhea.  Genitourinary:    Denies dysuria, increased frequency, flank pain.  Endocrine:     Denies hot or cold intolerance, polyuria, polydipsia. Musculoskeletal:  Denies unexplained myalgias, joint swelling, arthralgias, gait problems.  Skin:  Denies rash, suspicious lesions or new/ changes in moles Neurological:    Denies dizziness, syncope, unexplained weakness, lightheadedness, numbness  Psychiatric/Behavioral:   Denies mood changes, suicidal or homicidal ideations, hallucinations    Objective:   Blood pressure 107/72, pulse 88, height 5' 3.75" (1.619 m), weight 140 lb 14.4 oz (63.9 kg). Body mass index is 24.38 kg/m.   General: Well Developed, well nourished, and in no acute distress.  Neuro: Alert and oriented x3, extra-ocular muscles intact, sensation grossly intact.  HEENT: Normocephalic, atraumatic, pupils equal round reactive to light, neck supple, no gross masses, no carotid bruits, no JVD apprec Skin: no gross suspicious lesions or rashes  Cardiac: Regular rate and rhythm, no murmurs rubs or gallops.  Respiratory: Essentially clear  to auscultation bilaterally. Not using accessory muscles, speaking in full sentences.  Abdominal: Soft, not grossly distended Musculoskeletal: Ambulates w/o diff, FROM * 4 ext.  Vasc: less 2 sec cap RF, warm and pink  Psych:  No HI/SI, judgement and insight good.

## 2016-02-21 NOTE — Patient Instructions (Signed)
  Try to walk to a ultimate goal of 30 minutes at least for 5 days a week or more.   Even if you start at 5 minutes twice daily and over a month's time worked her way up to 10 minutes twice daily etc.   Top Ten Foods for Health  1. Water Drink at least 8 to 12 cups of water daily. Consume half of your body weight in pounds, is the amount of water in ounces to drink daily.  Ie: a 200lb person = 100 oz water daily  2. Dark Green Vegetables Eat dark green vegetables at least three to four times a week. Good options include broccoli, peppers, brussel sprouts and leafy greens like kale and spinach.  3. Whole Grains Whole grains should be included in your diet at least two to three times daily. Look for whole wheat flour, rye, oatmeal, barley, amaranth, quinoa or a multigrain. A good source of fiber includes 3 to 4 grams of fiber per serving. A great source has 5 or more grams of fiber per serving.  4. Beans and Lentils Try to eat a bean-based meal at least once a week. Try to add legumes, including beans and lentils, to soups, stews, casseroles, salads and dips or eat them plain.  5. Fish Try to eat two to three serving of fish a week. A serving consists of 3 to 4 ounces of cooked fish. Good choices are salmon, trout, herring, bluefish, sardines and tuna.  6. Berries Include two to four servings of fruit in your diet each day. Try to eat berries such as raspberries, blueberries, blackberries and strawberries.  7. Winter Squash Eat butternut and acorn squash as well as other richly pigmented dark orange and green colored vegetables like sweet potato, cantaloupe and mango.  8. Soy 25 grams of soy protein a day is recommended as part of a low-fat diet to help lower cholesterol levels. Try tofu, soymilk, edamame soybeans, tempeh and texturized vegetable protein (TVP).  9. Flaxseed, Nuts and Seeds Add 1 to 2 tablespoons of ground flaxseed or other seeds to food each day or include a moderate  amount of nuts - 1/4 cup - in your daily diet.  10. Organic Yogurt Men and women between 82 and 45 years of age need 1000 milligrams of calcium a day and 1200 milligrams if 53 or older. Eat calcium-rich foods such as nonfat or low-fat dairy products three to four times a day. Include organic choices.

## 2016-02-22 ENCOUNTER — Ambulatory Visit
Admission: RE | Admit: 2016-02-22 | Discharge: 2016-02-22 | Disposition: A | Discharge status: Home / Self Care | Payer: Medicare Other | Source: Ambulatory Visit | Attending: Family Medicine | Admitting: Family Medicine

## 2016-02-22 ENCOUNTER — Encounter: Payer: Self-pay | Admitting: Family Medicine

## 2016-02-22 DIAGNOSIS — I6523 Occlusion and stenosis of bilateral carotid arteries: Secondary | ICD-10-CM | POA: Diagnosis not present

## 2016-02-22 DIAGNOSIS — G459 Transient cerebral ischemic attack, unspecified: Secondary | ICD-10-CM

## 2016-02-22 DIAGNOSIS — E559 Vitamin D deficiency, unspecified: Secondary | ICD-10-CM | POA: Insufficient documentation

## 2016-02-22 NOTE — Assessment & Plan Note (Signed)
Apparently diagnosed by Dr. Rex Kras 4 yrs ago.  Been on adderall since then and per patient has made a large difference in her life in terms of focus and energy levels.  Her daughter has ADD as well, and strongly encouraged her mother to seek treatment once she was diagnosed by her doctor.  Patient has tolerated the ED D medicines well. No problems with appetite

## 2016-02-22 NOTE — Assessment & Plan Note (Signed)
Has been on supplementation for many years now. We will check vitamin D at next blood draw.

## 2016-02-22 NOTE — Assessment & Plan Note (Addendum)
Patient was sent to an endocrinologist by her PCP several years ago. Per patient, she had brittle nails, thinning of her hair and was excessively cold. The endocrinologist she saw said it did not warrant treatment, thus patient has never been on any Synthroid or other medicines.  We will check her TSH in the near future

## 2016-02-22 NOTE — Assessment & Plan Note (Signed)
Patient states her daughter thinks that she has hearing difficulties. Patient denies this. She does occasionally have ringing in her ears. She denies it is bothersome and declines further workup.

## 2016-02-24 DIAGNOSIS — E785 Hyperlipidemia, unspecified: Secondary | ICD-10-CM | POA: Insufficient documentation

## 2016-02-24 DIAGNOSIS — R5383 Other fatigue: Secondary | ICD-10-CM | POA: Insufficient documentation

## 2016-02-24 DIAGNOSIS — E538 Deficiency of other specified B group vitamins: Secondary | ICD-10-CM | POA: Insufficient documentation

## 2016-03-04 DIAGNOSIS — H35372 Puckering of macula, left eye: Secondary | ICD-10-CM | POA: Diagnosis not present

## 2016-03-14 ENCOUNTER — Encounter: Payer: Self-pay | Admitting: Family Medicine

## 2016-03-14 ENCOUNTER — Ambulatory Visit (INDEPENDENT_AMBULATORY_CARE_PROVIDER_SITE_OTHER): Payer: Medicare Other | Admitting: Family Medicine

## 2016-03-14 VITALS — BP 127/79 | HR 81 | Ht 63.75 in | Wt 140.7 lb

## 2016-03-14 DIAGNOSIS — E559 Vitamin D deficiency, unspecified: Secondary | ICD-10-CM

## 2016-03-14 DIAGNOSIS — Z1211 Encounter for screening for malignant neoplasm of colon: Secondary | ICD-10-CM

## 2016-03-14 DIAGNOSIS — F988 Other specified behavioral and emotional disorders with onset usually occurring in childhood and adolescence: Secondary | ICD-10-CM

## 2016-03-14 DIAGNOSIS — H6980 Other specified disorders of Eustachian tube, unspecified ear: Secondary | ICD-10-CM | POA: Insufficient documentation

## 2016-03-14 DIAGNOSIS — H9193 Unspecified hearing loss, bilateral: Secondary | ICD-10-CM | POA: Diagnosis not present

## 2016-03-14 DIAGNOSIS — E785 Hyperlipidemia, unspecified: Secondary | ICD-10-CM | POA: Diagnosis not present

## 2016-03-14 DIAGNOSIS — X32XXXA Exposure to sunlight, initial encounter: Secondary | ICD-10-CM

## 2016-03-14 DIAGNOSIS — E538 Deficiency of other specified B group vitamins: Secondary | ICD-10-CM

## 2016-03-14 DIAGNOSIS — L570 Actinic keratosis: Secondary | ICD-10-CM | POA: Insufficient documentation

## 2016-03-14 DIAGNOSIS — Z23 Encounter for immunization: Secondary | ICD-10-CM | POA: Diagnosis not present

## 2016-03-14 DIAGNOSIS — E079 Disorder of thyroid, unspecified: Secondary | ICD-10-CM | POA: Diagnosis not present

## 2016-03-14 DIAGNOSIS — F909 Attention-deficit hyperactivity disorder, unspecified type: Secondary | ICD-10-CM | POA: Diagnosis not present

## 2016-03-14 DIAGNOSIS — R609 Edema, unspecified: Secondary | ICD-10-CM | POA: Diagnosis not present

## 2016-03-14 DIAGNOSIS — R5382 Chronic fatigue, unspecified: Secondary | ICD-10-CM | POA: Diagnosis not present

## 2016-03-14 DIAGNOSIS — R600 Localized edema: Secondary | ICD-10-CM | POA: Insufficient documentation

## 2016-03-14 NOTE — Assessment & Plan Note (Signed)
Again discussed with patient that I do not recommend starting Adderall on a 82 year old woman, and continuing it.     I recommend she have a neuropsychiatric evaluation.     We will discuss this next office visit

## 2016-03-14 NOTE — Assessment & Plan Note (Signed)
We'll obtain TSH level

## 2016-03-14 NOTE — Assessment & Plan Note (Signed)
We'll obtain labs today

## 2016-03-14 NOTE — Assessment & Plan Note (Signed)
Patient has seen [facility] dermatology-Dr. Elvera Lennox in recent past for skin lesions. I recommend she go for a yearly skin screening evaluation and treatment.

## 2016-03-14 NOTE — Assessment & Plan Note (Signed)
Explained to patient that starting an exercise regimen of walking even 10:15 minutes a day would be very helpful for her energy levels, concentration and cardiovascular health and well-being

## 2016-03-14 NOTE — Assessment & Plan Note (Signed)
-   Health counseling performed- re: condition, various txmnt options  - Dietary and lifestyle modifications discussed and strongly encouraged- low salt, as I reminded patient this is the first line treatment for most disease processes.     - AHA guidelines for exercise discussed.  Handouts provided.  Regular aerobic activity of walking Compression stockings  handouts provided

## 2016-03-14 NOTE — Progress Notes (Signed)
Impression and Recommendations:    1. Peripheral edema- bilateral lower extremities   2. h/o Hyperlipidemia, mild   3. ADD (attention deficit disorder)   4. Screening for colon cancer   5. thyroid disease- at time did not warrant txmnt   6. h/o low B12- on supp   7. Chronic fatigue   8. Vitamin D deficiency   9. Senile solar keratosis   10. Need for Tdap vaccination   11. ETD (eustachian tube dysfunction), unspecified laterality    ---> Asked patient to make a follow-up office visit to discuss her chronic concerns about her knee  ADD (attention deficit disorder) Again discussed with patient that I do not recommend starting Adderall on a 82 year old woman, and continuing it.     I recommend she have a neuropsychiatric evaluation.     We will discuss this next office visit    Gen chronic Fatigue Explained to patient that starting an exercise regimen of walking even 10:15 minutes a day would be very helpful for her energy levels, concentration and cardiovascular health and well-being  Senile solar keratosis Patient has seen [facility] dermatology-Dr. Elvera Lennox in recent past for skin lesions. I recommend she go for a yearly skin screening evaluation and treatment.  Peripheral edema- bilateral lower extremities  - Health counseling performed- re: condition, various txmnt options  - Dietary and lifestyle modifications discussed and strongly encouraged- low salt, as I reminded patient this is the first line treatment for most disease processes.     - AHA guidelines for exercise discussed.  Handouts provided.  Regular aerobic activity of walking Compression stockings  handouts provided   h/o Hyperlipidemia, mild We'll obtain labs today  Vitamin D deficiency We'll obtain labs today; patient recently had a DEXA scan\bone density scan from the commercially driven screening company [employer]. Asked her to get me those records.  thyroid disease- at time did not warrant  txmnt We'll obtain TSH level  h/o low B12- on supp We'll obtain labs today  ETD- Sx of L ear Encouraged patient to use sinus rinses such as neil med sinus rinse, twice daily.  -  follow-up 1 month approximately to see if symptoms improved. If not we can consider Flonase and the like   Please see orders section below for further details of actions taken during this office visit.  Gross side effects, risk and benefits, and alternatives of medications discussed with patient.  Patient is aware that all medications have potential side effects and we are unable to predict every side effect or drug-drug interaction that may occur.  Expresses verbal understanding and consents to current therapy plan and treatment regiment.  1) Anticipatory Guidance: Discussed sunscreen when outside along with yearly skin surveillance; eating a well balanced and modest diet; physical activity at least 25 minutes per day or 150 min/ week of moderate to intense activity strongly encouraged  2) Immunizations / Screenings / Labs:  All immunizations and screenings that patient agrees to, are up-to-date per recommendations or will be updated today.  Patient understands the needs for q 59mo dental and yearly vision screens which pt will schedule independently. Obtain fasting lab work today.   3) Weight:   BMI does indicate normal wt.  Improve nutrient density of diet through increasing intake of fruits and vegetables and decreasing saturated/trans fats, white flour products and refined sugar products.   F-up preventative CPE in 1 year. F/up sooner for chronic care management as discussed and/or prn.  Please see orders placed and  AVS handed out to patient at the end of our visit for further patient instructions/ counseling done pertaining to today's office visit.     Subjective:    Chief Complaint  Patient presents with  . Annual Exam   CC: Complains of Chronic left knee pain, Chronic gurgling in her left ear,  Chronic swelling in her bilateral lower extremities  HPI: Cheryl Martin is a 82 y.o. female who presents to [facility] today a yearly health maintenance exam.  Health Maintenance Summary Reviewed and updated, unless pt declines services.  History  Smoking Status  . Former Smoker  . Quit date: 07/23/1971  Smokeless Tobacco  . Never Used   Alcohol:        No concerns, no  use Exercise Habits:     Active lifestyle but no aerobic routine exercise STD concerns:     None PAP: Had 1 2015 which was normal because of acute complaints she was having but has always had normal Pap smears. [employer] does not recommend repeat greater than 62 years old. Drug Use:     None Birth control method:    none Menses regular:      n/a Lumps or breast concerns:      No  Patient's last mammogram was normal in 2015;   last colonoscopy was normal in 2008.   Had a DEXA scan and a carotid ultrasound of her bilateral carotids through "[facility]" recently- asked patient to bring me in copies of the results when she gets them.      Wt Readings from Last 3 Encounters:  03/14/16 140 lb 11.2 oz (63.8 kg)  02/21/16 140 lb 14.4 oz (63.9 kg)  04/19/15 143 lb 11.2 oz (65.2 kg)   BP Readings from Last 3 Encounters:  03/14/16 127/79  02/21/16 107/72  04/19/15 134/67   Pulse Readings from Last 3 Encounters:  03/14/16 81  02/21/16 88  04/19/15 72      Past Medical History:  Diagnosis Date  . ADD (attention deficit disorder)   . Allergy   . Edema   . Hearing loss   . Neuromuscular disorder ([place])   . Nocturia   . Thyroid disease       Past Surgical History:  Procedure Laterality Date  . ABDOMINAL HYSTERECTOMY    . CHOLECYSTECTOMY    . COLONOSCOPY    . EYE SURGERY    . HAND SURGERY    . PARTIAL HYSTERECTOMY        Family History  Problem Relation Age of Onset  . Cancer Brother     stomach, colon      History  Drug Use No  ,   History  Alcohol Use No   ,   History  Smoking Status  . Former Smoker  . Quit date: 07/23/1971  Smokeless Tobacco  . Never Used  ,   History  Sexual Activity  . Sexual activity: No    Current Outpatient Prescriptions on File Prior to Visit  Medication Sig Dispense Refill  . amphetamine-dextroamphetamine (ADDERALL) 20 MG tablet Take 20 mg by mouth 2 (two) times daily.    . Ascorbic Acid (VITAMIN C) 1000 MG tablet Take 1,000 mg by mouth daily.    . Carnitine 250 MG CAPS Take 2 capsules by mouth daily.    . Cholecalciferol (VITAMIN D3) 2000 units TABS Take 1 capsule by mouth daily.    Marland Kitchen FISH OIL-KRILL OIL PO Take by mouth.    Marland Kitchen  Misc Natural Products (TART CHERRY ADVANCED PO) Take 1,200 mg by mouth daily.    . Multiple Vitamin (MULITIVITAMIN WITH MINERALS) TABS Take 1 tablet by mouth daily.    . Turmeric 500 MG CAPS Take 1 tablet by mouth daily.     No current facility-administered medications on file prior to visit.      Allergies: Review of patient's allergies indicates no known allergies.    Review of Systems:  ( Completed via her adult medical history intake form today ) General:  Denies fever, chills, appetite changes, unexplained weight loss.  Respiratory: Denies SOB, DOE, cough, wheezing.  Cardiovascular: Denies chest pain, palpitations.  Gastrointestinal: Denies nausea, vomiting, diarrhea, abdominal pain.  Genitourinary: Denies dysuria, increased frequency, flank pain. Endocrine: Denies hot or cold intolerance, polyuria, polydipsia. Musculoskeletal: Denies myalgias, back pain, joint swelling, + arthralgias, - gait problems.  Skin: Denies pallor, rash, multiple age-related lesions- non-changing in size, colorand none  symptomatic.  Neurological: Denies dizziness, seizures, syncope, unexplained weakness, lightheadedness, numbness and headaches.  Psychiatric/Behavioral: Denies mood changes, suicidal or homicidal ideations, hallucinations, sleep disturbances.   Objective:    Blood pressure  127/79, pulse 81, height 5' 3.75" (1.619 m), weight 140 lb 11.2 oz (63.8 kg). Body mass index is 24.34 kg/m. General Appearance:    Alert, cooperative, no distress, appears stated age  Head:    Normocephalic, without obvious abnormality, atraumatic  Eyes:    PERRL, conjunctiva/corneas clear, EOM's intact both eyes  Ears:    Normal TM's and external ear canals, both ears; no dry or flaky skin of EAC. No bulging TM's. No erythema.   Nose:   Nares normal, septum midline, mucosa normal, no drainage    or sinus tenderness  Throat:   Lips w/o lesion, mucosa moist, and tongue normal; teeth and   gums - age-appropriate changes   Neck:   Supple, symmetrical, trachea midline, no adenopathy;    thyroid:  no enlargement/tenderness/nodules; no carotid   bruit or JVD  Back:     Symmetric, no curvature, ROM normal, no CVA tenderness  Lungs:     Clear to auscultation bilaterally, respirations unlabored, no       Wh/ R/ R  Chest Wall:    No tenderness or gross deformity; normal excursion   Heart:    Regular rate and rhythm, S1 and S2 normal, no murmur, rub   or gallop  Breast Exam:    No tenderness, masses, or nipple abnormality b/l; no d/c  Abdomen:     Soft, non-tender, bowel sounds active all four quadrants, NO   G/R/R, no masses, no organomegaly  Genitalia:  Deferred  Rectal:  Deferred   Extremities:   Extremities normal, atraumatic, no cyanosis, 2+ non-pit gross edema bilateral lower extremities with chronic venous stasis changes on medial aspects of bilateral lower legs.   Pulses:   2+ and symmetric all extremities  Skin:   Warm, dry, Skin color, texture, turgor normal, Many solar and actinic keratosis lesions over chest, shoulders and back  Neurologic:   CNII-XII intact, normal strength, sensation and reflexes    Throughout     No results found for this or any previous visit (from the past 2160 hour(s)).

## 2016-03-14 NOTE — Assessment & Plan Note (Addendum)
We'll obtain labs today; patient recently had a DEXA scan\bone density scan from the commercially driven screening company [employer]. Asked her to get me those records.

## 2016-03-14 NOTE — Assessment & Plan Note (Signed)
Encouraged patient to use sinus rinses such as neil med sinus rinse, twice daily.  -  follow-up 1 month approximately to see if symptoms improved. If not we can consider Flonase and the like

## 2016-03-14 NOTE — Patient Instructions (Signed)
Peripheral Edema °You have swelling in your legs (peripheral edema). This swelling is due to excess accumulation of salt and water in your body. Edema may be a sign of heart, kidney or liver disease, or a side effect of a medication. It may also be due to problems in the leg veins. Elevating your legs and using special support stockings may be very helpful, if the cause of the swelling is due to poor venous circulation. Avoid long periods of standing, whatever the cause. °Treatment of edema depends on identifying the cause. Chips, pretzels, pickles and other salty foods should be avoided. Restricting salt in your diet is almost always needed. Water pills (diuretics) are often used to remove the excess salt and water from your body via urine. These medicines prevent the kidney from reabsorbing sodium. This increases urine flow. °Diuretic treatment may also result in lowering of potassium levels in your body. Potassium supplements may be needed if you have to use diuretics daily. Daily weights can help you keep track of your progress in clearing your edema. You should call your caregiver for follow up care as recommended. °SEEK IMMEDIATE MEDICAL CARE IF:  °· You have increased swelling, pain, redness, or heat in your legs. °· You develop shortness of breath, especially when lying down. °· You develop chest or abdominal pain, weakness, or fainting. °· You have a fever. °  °This information is not intended to replace advice given to you by your health care provider. Make sure you discuss any questions you have with your health care provider. °  °Document Released: 08/15/2004 Document Revised: 09/30/2011 Document Reviewed: 01/18/2015 °Elsevier Interactive Patient Education ©2016 Elsevier Inc. ° °Osteoarthritis °Osteoarthritis is a disease that causes soreness and inflammation of a joint. It occurs when the cartilage at the affected joint wears down. Cartilage acts as a cushion, covering the ends of bones where they meet  to form a joint. Osteoarthritis is the most common form of arthritis. It often occurs in older people. The joints affected most often by this condition include those in the: °· Ends of the fingers. °· Thumbs. °· Neck. °· Lower back. °· Knees. °· Hips. °CAUSES  °Over time, the cartilage that covers the ends of bones begins to wear away. This causes bone to rub on bone, producing pain and stiffness in the affected joints.  °RISK FACTORS °Certain factors can increase your chances of having osteoarthritis, including: °· Older age. °· Excessive body weight. °· Overuse of joints. °· Previous joint injury. °SIGNS AND SYMPTOMS  °· Pain, swelling, and stiffness in the joint. °· Over time, the joint may lose its normal shape. °· Small deposits of bone (osteophytes) may grow on the edges of the joint. °· Bits of bone or cartilage can break off and float inside the joint space. This may cause more pain and damage. °DIAGNOSIS  °Your health care provider will do a physical exam and ask about your symptoms. Various tests may be ordered, such as: °· X-rays of the affected joint. °· Blood tests to rule out other types of arthritis. °Additional tests may be used to diagnose your condition. °TREATMENT  °Goals of treatment are to control pain and improve joint function. Treatment plans may include: °· A prescribed exercise program that allows for rest and joint relief. °· A weight control plan. °· Pain relief techniques, such as: °¨ Properly applied heat and cold. °¨ Electric pulses delivered to nerve endings under the skin (transcutaneous electrical nerve stimulation [TENS]). °¨ Massage. °¨ Certain nutritional   supplements. °· Medicines to control pain, such as: °¨ Acetaminophen. °¨ Nonsteroidal anti-inflammatory drugs (NSAIDs), such as naproxen. °¨ Narcotic or central-acting agents, such as tramadol. °¨ Corticosteroids. These can be given orally or as an injection. °· Surgery to reposition the bones and relieve pain (osteotomy) or to  remove loose pieces of bone and cartilage. Joint replacement may be needed in advanced states of osteoarthritis. °HOME CARE INSTRUCTIONS  °· Take medicines only as directed by your health care provider. °· Maintain a healthy weight. Follow your health care provider's instructions for weight control. This may include dietary instructions. °· Exercise as directed. Your health care provider can recommend specific types of exercise. These may include: °¨ Strengthening exercises. These are done to strengthen the muscles that support joints affected by arthritis. They can be performed with weights or with exercise bands to add resistance. °¨ Aerobic activities. These are exercises, such as brisk walking or low-impact aerobics, that get your heart pumping. °¨ Range-of-motion activities. These keep your joints limber. °¨ Balance and agility exercises. These help you maintain daily living skills. °· Rest your affected joints as directed by your health care provider. °· Keep all follow-up visits as directed by your health care provider. °SEEK MEDICAL CARE IF:  °· Your skin turns red. °· You develop a rash in addition to your joint pain. °· You have worsening joint pain. °· You have a fever along with joint or muscle aches. °SEEK IMMEDIATE MEDICAL CARE IF: °· You have a significant loss of weight or appetite. °· You have night sweats. °FOR MORE INFORMATION  °· National Institute of Arthritis and Musculoskeletal and Skin Diseases: www.niams.nih.gov °· National Institute on Aging: www.nia.nih.gov °· American College of Rheumatology: www.rheumatology.org °  °This information is not intended to replace advice given to you by your health care provider. Make sure you discuss any questions you have with your health care provider. °  °Document Released: 07/08/2005 Document Revised: 07/29/2014 Document Reviewed: 03/15/2013 °Elsevier Interactive Patient Education ©2016 Elsevier Inc. ° °

## 2016-03-15 LAB — LIPID PANEL
Cholesterol: 231 mg/dL — ABNORMAL HIGH (ref 125–200)
HDL: 80 mg/dL (ref >=46)
LDL CALC: 134 mg/dL — AB (ref <130)
TRIGLYCERIDES: 87 mg/dL (ref <150)
Total CHOL/HDL Ratio: 2.9 Ratio (ref <=5.0)
VLDL: 17 mg/dL (ref <30)

## 2016-03-15 LAB — COMPLETE METABOLIC PANEL WITH GFR
ALT: 17 U/L (ref 6–29)
AST: 19 U/L (ref 10–35)
Albumin: 4.2 g/dL (ref 3.6–5.1)
Alkaline Phosphatase: 99 U/L (ref 33–130)
BUN: 13 mg/dL (ref 7–25)
CHLORIDE: 106 mmol/L (ref 98–110)
CO2: 27 mmol/L (ref 20–31)
CREATININE: 0.84 mg/dL (ref 0.60–0.88)
Calcium: 9.6 mg/dL (ref 8.6–10.4)
GFR, EST AFRICAN AMERICAN: 75 mL/min (ref >=60)
GFR, EST NON AFRICAN AMERICAN: 65 mL/min (ref >=60)
GLUCOSE: 85 mg/dL (ref 65–99)
Potassium: 4.2 mmol/L (ref 3.5–5.3)
SODIUM: 140 mmol/L (ref 135–146)
Total Bilirubin: 0.8 mg/dL (ref 0.2–1.2)
Total Protein: 6.1 g/dL (ref 6.1–8.1)

## 2016-03-15 LAB — CBC WITH DIFFERENTIAL/PLATELET
BASOS PCT: 1 %
Basophils Absolute: 41 cells/uL (ref 0–200)
EOS ABS: 123 {cells}/uL (ref 15–500)
EOS PCT: 3 %
HCT: 41.6 % (ref 35.0–45.0)
Hemoglobin: 13.7 g/dL (ref 11.7–15.5)
LYMPHS PCT: 26 %
Lymphs Abs: 1066 cells/uL (ref 850–3900)
MCH: 30.1 pg (ref 27.0–33.0)
MCHC: 32.9 g/dL (ref 32.0–36.0)
MCV: 91.4 fL (ref 80.0–100.0)
MONOS PCT: 13 %
MPV: 9.8 fL (ref 7.5–12.5)
Monocytes Absolute: 533 cells/uL (ref 200–950)
NEUTROS ABS: 2337 {cells}/uL (ref 1500–7800)
Neutrophils Relative %: 57 %
PLATELETS: 250 10*3/uL (ref 140–400)
RBC: 4.55 MIL/uL (ref 3.80–5.10)
RDW: 13.3 % (ref 11.0–15.0)
WBC: 4.1 10*3/uL (ref 3.8–10.8)

## 2016-03-15 LAB — TSH: TSH: 1.76 mIU/L

## 2016-03-15 LAB — VITAMIN B12

## 2016-03-15 LAB — VITAMIN D 25 HYDROXY (VIT D DEFICIENCY, FRACTURES): Vit D, 25-Hydroxy: 47 ng/mL (ref 30–100)

## 2016-03-26 ENCOUNTER — Other Ambulatory Visit (INDEPENDENT_AMBULATORY_CARE_PROVIDER_SITE_OTHER): Payer: Medicare Other

## 2016-03-26 DIAGNOSIS — Z1211 Encounter for screening for malignant neoplasm of colon: Secondary | ICD-10-CM | POA: Diagnosis not present

## 2016-03-26 LAB — IFOBT (OCCULT BLOOD)
IFOBT: NEGATIVE
IMMUNOLOGICAL FECAL OCCULT BLOOD TEST: NEGATIVE
IMMUNOLOGICAL FECAL OCCULT BLOOD TEST: NEGATIVE

## 2016-04-11 ENCOUNTER — Encounter: Payer: Self-pay | Admitting: Family Medicine

## 2016-04-11 ENCOUNTER — Ambulatory Visit (INDEPENDENT_AMBULATORY_CARE_PROVIDER_SITE_OTHER): Payer: Medicare Other | Admitting: Family Medicine

## 2016-04-11 ENCOUNTER — Ambulatory Visit
Admission: RE | Admit: 2016-04-11 | Discharge: 2016-04-11 | Disposition: A | Discharge status: Home / Self Care | Payer: Medicare Other | Source: Ambulatory Visit | Attending: Family Medicine | Admitting: Family Medicine

## 2016-04-11 DIAGNOSIS — M179 Osteoarthritis of knee, unspecified: Secondary | ICD-10-CM | POA: Diagnosis not present

## 2016-04-11 DIAGNOSIS — G8929 Other chronic pain: Secondary | ICD-10-CM | POA: Insufficient documentation

## 2016-04-11 DIAGNOSIS — M25562 Pain in left knee: Principal | ICD-10-CM

## 2016-04-11 MED ORDER — DICLOFENAC SODIUM 1 % TD GEL: 2.0000 g | Freq: Four times a day (QID) | TRANSDERMAL | 11 refills | Status: DC

## 2016-04-11 NOTE — Progress Notes (Signed)
Impression and Recommendations:    The patient was counselled, risk factors were discussed, anticipatory guidance given.   1. Chronic pain of left knee    L KNEE PAIN:  - PT referral placed. Discussed the risks benefits of this with patient and importance of strengthening muscles around the knee area regardless of pathology. - X-ray R knee- orders placed and instructions given to patient - NSAIDS ( Voltaren gel or icey hot - which pt prefers)  when necessary; risk benefits discussed with patient. - ICE for 15-20 minutes, 3-4 times per day. - pt understands may need MRI if knee continues to be unstable/ does not improve with our conservative measures   Patient's Medications  New Prescriptions   DICLOFENAC SODIUM (VOLTAREN) 1 % GEL    Apply 2 g topically 4 (four) times daily. To affected joint.  Previous Medications   AMINO ACIDS (AMINO ACID PO)    Take 2 tablets by mouth daily.   ASCORBIC ACID (VITAMIN C) 1000 MG TABLET    Take 1,000 mg by mouth daily.   ASTAXANTHIN 4 MG CAPS    Take 2 tablets by mouth daily.   CARNITINE 250 MG CAPS    Take 2 capsules by mouth daily.   CHOLECALCIFEROL (VITAMIN D3) 2000 UNITS TABS    Take 1 capsule by mouth daily.   CYANOCOBALAMIN ([facility] B-12) 1500 MCG TBDP    Take 1 tablet by mouth daily.   FISH OIL-KRILL OIL PO    Take by mouth.   FOLIC ACID (FOLVITE) A999333 MCG TABLET    Take 400 mcg by mouth daily.   MISC NATURAL PRODUCTS (TART CHERRY ADVANCED PO)    Take 1,200 mg by mouth daily.   MULTIPLE VITAMIN (MULITIVITAMIN WITH MINERALS) TABS    Take 1 tablet by mouth daily.   TURMERIC 500 MG CAPS    Take 1 tablet by mouth daily.  Modified Medications   No medications on file  Discontinued Medications   AMPHETAMINE-DEXTROAMPHETAMINE (ADDERALL) 20 MG TABLET    Take 20 mg by mouth 2 (two) times daily.    New Prescriptions   DICLOFENAC SODIUM (VOLTAREN) 1 % GEL    Apply 2 g topically 4 (four) times daily. To affected joint.    Return in about 5  days (around 04/16/2016) for Follow-up of x-ray of her knee for possible injection to alleviate pain.Marland Kitchen  Please see AVS handed out to patient at the end of our visit for further patient instructions/ counseling done pertaining to today's office visit.  Note: This document was prepared using Dragon voice recognition software and may include unintentional dictation errors.    Subjective: Cheryl Martin is a very pleasant 82 y.o. female who complains of left knee pain and swelling.       hurting for yrs and yrs now W more recently,   can't bend it or kneel on it- and patient loves to garden.    Swells easily- esp with activity. Very stiff after prolonged rest and then she goes to move    Mechanism of injury: N/A.     Symptoms worsening with time.   Pain all over and behind knee- more swelling on front per pt.    Had a sensation it will give out due to sharp pain- with squatting esp with weeding garden.   Dr Scot Dock vasc guy told her no blood clots etc which patient is primarily concerned about today.  Tried- elevated it, iced it in past.  No other txmnts     Past Medical History:  Diagnosis Date  . ADD (attention deficit disorder)   . Allergy   . Edema   . Hearing loss   . Neuromuscular disorder ([place])   . Nocturia   . Thyroid disease     Past Surgical History:  Procedure Laterality Date  . ABDOMINAL HYSTERECTOMY    . CHOLECYSTECTOMY    . COLONOSCOPY    . EYE SURGERY    . HAND SURGERY    . PARTIAL HYSTERECTOMY      History  Drug Use No  ,  History  Alcohol Use No  ,  History  Smoking Status  . Former Smoker  . Quit date: 07/23/1971  Smokeless Tobacco  . Never Used  ,  History  Sexual Activity  . Sexual activity: No    Patient's Medications  New Prescriptions   DICLOFENAC SODIUM (VOLTAREN) 1 % GEL    Apply 2 g topically 4 (four) times daily. To affected joint.  Previous Medications   AMINO ACIDS (AMINO ACID PO)    Take 2 tablets by mouth daily.    ASCORBIC ACID (VITAMIN C) 1000 MG TABLET    Take 1,000 mg by mouth daily.   ASTAXANTHIN 4 MG CAPS    Take 2 tablets by mouth daily.   CARNITINE 250 MG CAPS    Take 2 capsules by mouth daily.   CHOLECALCIFEROL (VITAMIN D3) 2000 UNITS TABS    Take 1 capsule by mouth daily.   CYANOCOBALAMIN ([employer] B-12) 1500 MCG TBDP    Take 1 tablet by mouth daily.   FISH OIL-KRILL OIL PO    Take by mouth.   FOLIC ACID (FOLVITE) A999333 MCG TABLET    Take 400 mcg by mouth daily.   MISC NATURAL PRODUCTS (TART CHERRY ADVANCED PO)    Take 1,200 mg by mouth daily.   MULTIPLE VITAMIN (MULITIVITAMIN WITH MINERALS) TABS    Take 1 tablet by mouth daily.   TURMERIC 500 MG CAPS    Take 1 tablet by mouth daily.  Modified Medications   No medications on file  Discontinued Medications   AMPHETAMINE-DEXTROAMPHETAMINE (ADDERALL) 20 MG TABLET    Take 20 mg by mouth 2 (two) times daily.    Review of patient's allergies indicates no known allergies.  Review of Systems  Constitutional: Negative.  Negative for chills and fever.  HENT: Negative.   Eyes: Negative.   Respiratory: Negative.   Cardiovascular: Negative.   Gastrointestinal: Negative.   Genitourinary: Negative.   Musculoskeletal: Positive for back pain and joint pain. Negative for falls and myalgias.       L Knee pain x MANY yrs  Skin: Negative.  Negative for rash.  Neurological: Negative.  Negative for dizziness, sensory change and focal weakness.  Endo/Heme/Allergies: Negative.  Does not bruise/bleed easily.     Objective:    Blood pressure 124/72, pulse 67, height 5' 3.75" (1.619 m), weight 143 lb 11.2 oz (65.2 kg).  Body mass index is 24.86 kg/m. General: Well Developed, well nourished, and in no acute distress.  Neuro: Alert and oriented x3  HEENT: Normocephalic, atraumatic Skin: no gross suspicious lesions or rashes, No ecchymosis Respiratory: Not using accessory muscles, speaking in full sentences.  Abdominal: Soft, not grossly  distended Musculoskeletal: Ambulates w/o diff, FROM * 4 ext.  Knee:  Scant amount peripatellar swelling L knee; no gross instability to posterior or anterior drawer test; negative varus stress test;  discomfort on valgus stress test. Valgus  deformity of left leg, Pain with end ranges of flexion; Ess FROM with mild crepitus, mild soft tissue swelling posterior knee, no ecchymosis, normal contralateral knee exam. Neurovascularly intact distally. No pain upon quick palpation of the ipsilateral hip and ankle joints. Vasc: less 2 sec cap RF, warm and pink  Psych: No HI/SI, judgement and insight good, Euthymic mood. Full Affect.

## 2016-04-11 NOTE — Patient Instructions (Addendum)
Use something like this to do your gardening -  "[employer] ".  Printed off picture from website for patient.   No squatting or kneeling or deep bending. Try to avoid going from sitting to standing as much as possible   Knee Pain Knee pain is a very common symptom and can have many causes. Knee pain often goes away when you follow your health care provider's instructions for relieving pain and discomfort at home. However, knee pain can develop into a condition that needs treatment. Some conditions may include:  Arthritis caused by wear and tear (osteoarthritis).  Arthritis caused by swelling and irritation (rheumatoid arthritis or gout).  A cyst or growth in your knee.  An infection in your knee joint.  An injury that will not heal.  Damage, swelling, or irritation of the tissues that support your knee (torn ligaments or tendinitis). If your knee pain continues, additional tests may be ordered to diagnose your condition. Tests may include X-rays or other imaging studies of your knee. You may also need to have fluid removed from your knee. Treatment for ongoing knee pain depends on the cause, but treatment may include:  Medicines to relieve pain or swelling.  Steroid injections in your knee.  Physical therapy.  Surgery. HOME CARE INSTRUCTIONS  Take medicines only as directed by your health care provider.  Rest your knee and keep it raised (elevated) while you are resting.  Do not do things that cause or worsen pain.  Avoid high-impact activities or exercises, such as running, jumping rope, or doing jumping jacks.  Apply ice to the knee area:  Put ice in a plastic bag.  Place a towel between your skin and the bag.  Leave the ice on for 20 minutes, 2-3 times a day.  Ask your health care provider if you should wear an elastic knee support.  Keep a pillow under your knee when you  sleep.  Lose weight if you are overweight. Extra weight can put pressure on your knee.  Do not use any tobacco products, including cigarettes, chewing tobacco, or electronic cigarettes. If you need help quitting, ask your health care provider. Smoking may slow the healing of any bone and joint problems that you may have. SEEK MEDICAL CARE IF:  Your knee pain continues, changes, or gets worse.  You have a fever along with knee pain.  Your knee buckles or locks up.  Your knee becomes more swollen. SEEK IMMEDIATE MEDICAL CARE IF:   Your knee joint feels hot to the touch.  You have chest pain or trouble breathing.   This information is not intended to replace advice given to you by your health care provider. Make sure you discuss any questions you have with your health care provider.   Document Released: 05/05/2007 Document Revised: 07/29/2014 Document Reviewed: 02/21/2014 Elsevier Interactive Patient Education [employer].

## 2016-04-12 ENCOUNTER — Telehealth: Payer: Self-pay

## 2016-04-12 NOTE — Telephone Encounter (Signed)
Received fax from pharmacy stating that diclofenac required PA.  Per Dr. Raliegh Scarlet, LVM informing pt to use Aspercreme instead of diclofenac.  Charyl Bigger, CMA

## 2016-04-15 ENCOUNTER — Ambulatory Visit (INDEPENDENT_AMBULATORY_CARE_PROVIDER_SITE_OTHER): Payer: Medicare Other | Admitting: Family Medicine

## 2016-04-15 ENCOUNTER — Encounter: Payer: Self-pay | Admitting: Family Medicine

## 2016-04-15 VITALS — Ht 63.75 in | Wt 145.9 lb

## 2016-04-15 DIAGNOSIS — G8929 Other chronic pain: Secondary | ICD-10-CM

## 2016-04-15 DIAGNOSIS — M25562 Pain in left knee: Secondary | ICD-10-CM | POA: Diagnosis not present

## 2016-04-15 DIAGNOSIS — R350 Frequency of micturition: Secondary | ICD-10-CM | POA: Diagnosis not present

## 2016-04-15 DIAGNOSIS — R3915 Urgency of urination: Secondary | ICD-10-CM | POA: Diagnosis not present

## 2016-04-15 DIAGNOSIS — M1710 Unilateral primary osteoarthritis, unspecified knee: Secondary | ICD-10-CM | POA: Insufficient documentation

## 2016-04-15 DIAGNOSIS — M179 Osteoarthritis of knee, unspecified: Secondary | ICD-10-CM | POA: Insufficient documentation

## 2016-04-15 DIAGNOSIS — M1712 Unilateral primary osteoarthritis, left knee: Secondary | ICD-10-CM | POA: Diagnosis not present

## 2016-04-15 LAB — POCT URINALYSIS DIPSTICK
Bilirubin, UA: NEGATIVE
GLUCOSE UA: NEGATIVE
Leukocytes, UA: NEGATIVE
NITRITE UA: NEGATIVE
Protein, UA: NEGATIVE
RBC UA: NEGATIVE
Spec Grav, UA: 1.025
UROBILINOGEN UA: 0.2
pH, UA: 7

## 2016-04-15 NOTE — Patient Instructions (Addendum)
15 min - 20 every couple hours- ice knee.  Let us know if you have any questions/ concerns  If you haven't heard from anyone for over a week about the urology referral, please call us.   Knee Pain Knee pain is a very common symptom and can have many causes. Knee pain often goes away when you follow your health care provider's instructions for relieving pain and discomfort at home. However, knee pain can develop into a condition that needs treatment. Some conditions may include:  Arthritis caused by wear and tear (osteoarthritis).  Arthritis caused by swelling and irritation (rheumatoid arthritis or gout).  A cyst or growth in your knee.  An infection in your knee joint.  An injury that will not heal.  Damage, swelling, or irritation of the tissues that support your knee (torn ligaments or tendinitis). If your knee pain continues, additional tests may be ordered to diagnose your condition. Tests may include X-rays or other imaging studies of your knee. You may also need to have fluid removed from your knee. Treatment for ongoing knee pain depends on the cause, but treatment may include:  Medicines to relieve pain or swelling.  Steroid injections in your knee.  Physical therapy.  Surgery. HOME CARE INSTRUCTIONS  Take medicines only as directed by your health care provider.  Rest your knee and keep it raised (elevated) while you are resting.  Do not do things that cause or worsen pain.  Avoid high-impact activities or exercises, such as running, jumping rope, or doing jumping jacks.  Apply ice to the knee area:  Put ice in a plastic bag.  Place a towel between your skin and the bag.  Leave the ice on for 20 minutes, 2-3 times a day.  Ask your health care provider if you should wear an elastic knee support.  Keep a pillow under your knee when you sleep.  Lose weight if you are overweight. Extra weight can put pressure on your knee.  Do not use any tobacco products,  including cigarettes, chewing tobacco, or electronic cigarettes. If you need help quitting, ask your health care provider. Smoking may slow the healing of any bone and joint problems that you may have. SEEK MEDICAL CARE IF:  Your knee pain continues, changes, or gets worse.  You have a fever along with knee pain.  Your knee buckles or locks up.  Your knee becomes more swollen. SEEK IMMEDIATE MEDICAL CARE IF:   Your knee joint feels hot to the touch.  You have chest pain or trouble breathing.   This information is not intended to replace advice given to you by your health care provider. Make sure you discuss any questions you have with your health care provider.   Document Released: 05/05/2007 Document Revised: 07/29/2014 Document Reviewed: 02/21/2014 Elsevier Interactive Patient Education [employer].

## 2016-04-15 NOTE — Progress Notes (Signed)
Subjective:    HPI: Cheryl Martin is a 82 y.o. female who presents to [facility] today for  1)  c/o dysuria. 2) Left knee pain and swelling  1)  Sx for 58months- leaking urine-  Now worse    C/O: no dysuria y - increased frequency y - increased urgency y- couple months malodorous urination n - past couple days-  Nausea No -just chilled past couple days Fever/ Chills No -new Back Pain  + vaginal discharge no ABX usage past 30 d   2)  complains of left knee pain and swelling on and off for yrs and yrs,now W  more recently, can't bend it or kneel on it- and patient loves to garden.    Swells easily- esp with activity. Very stiff after prolonged rest and then she goes to move- takes a while to get moving in AM's   Mechanism of injury: N/A.     Symptoms worsening with time.   Pain all over and behind knee- more swelling on front per pt.    Had a sensation it will give out due to sharp pain- with squatting esp with weeding garden.    Dr Scot Dock vasc guy told her no blood clots etc which patient is primarily concerned about   Tried- elevated it, iced it in past. No other txmnts/ no previous OV's PAIN LEVEL:  4-5/10 TODAY pre-procedure    Urinalysis    Component Value Date/Time   BILIRUBINUR negative 04/15/2016 1523   PROTEINUR negative 04/15/2016 1523   UROBILINOGEN 0.2 04/15/2016 1523   NITRITE negative 04/15/2016 1523   LEUKOCYTESUR Negative 04/15/2016 1523    Wt Readings from Last 3 Encounters:  04/15/16 145 lb 14.4 oz (66.2 kg)  04/11/16 143 lb 11.2 oz (65.2 kg)  03/14/16 140 lb 11.2 oz (63.8 kg)   BP Readings from Last 3 Encounters:  04/11/16 124/72  03/14/16 127/79  02/21/16 107/72   Pulse Readings from Last 3 Encounters:  04/11/16 67  03/14/16 81  02/21/16 88   BMI Readings from Last 3 Encounters:  04/15/16 25.24 kg/m  04/11/16 24.86 kg/m  03/14/16 24.34 kg/m     Patient Active Problem List   Diagnosis  Date Noted  . OA (osteoarthritis) of knee 04/15/2016  . Chronic pain of left knee 04/11/2016  . Senile solar keratosis 03/14/2016  . Peripheral edema- bilateral lower extremities 03/14/2016  . ETD- Sx of L ear 03/14/2016  . h/o Hyperlipidemia, mild 02/24/2016  . h/o low B12- on supp 02/24/2016  . Gen chronic Fatigue 02/24/2016  . Vitamin D deficiency 02/22/2016  . Status post hysterectomy  02/21/2016  . ADD (attention deficit disorder) 02/19/2011  . Decreased hearing 02/19/2011  . thyroid disease- at time did not warrant txmnt 02/19/2011    Past Surgical History:  Procedure Laterality Date  . ABDOMINAL HYSTERECTOMY    . CHOLECYSTECTOMY    . COLONOSCOPY    . EYE SURGERY    . HAND SURGERY    . PARTIAL HYSTERECTOMY      Family History  Problem Relation Age of Onset  . Cancer Brother     stomach, colon    History  Drug Use No  ,  History  Alcohol Use No  ,  History  Smoking Status  . Former Smoker  . Quit date: 07/23/1971  Smokeless Tobacco  . Never Used  ,  History  Sexual Activity  . Sexual activity: No  Patient's Medications  New Prescriptions   No medications on file  Previous Medications   AMINO ACIDS (AMINO ACID PO)    Take 2 tablets by mouth daily.   ASCORBIC ACID (VITAMIN C) 1000 MG TABLET    Take 1,000 mg by mouth daily.   ASTAXANTHIN 4 MG CAPS    Take 2 tablets by mouth daily.   CARNITINE 250 MG CAPS    Take 2 capsules by mouth daily.   CHOLECALCIFEROL (VITAMIN D3) 2000 UNITS TABS    Take 1 capsule by mouth daily.   CYANOCOBALAMIN ([facility] B-12) 1500 MCG TBDP    Take 1 tablet by mouth daily.   DICLOFENAC SODIUM (VOLTAREN) 1 % GEL    Apply 2 g topically 4 (four) times daily. To affected joint.   FISH OIL-KRILL OIL PO    Take by mouth.   FOLIC ACID (FOLVITE) A999333 MCG TABLET    Take 400 mcg by mouth daily.   MISC NATURAL PRODUCTS (TART CHERRY ADVANCED PO)    Take 1,200 mg by mouth daily.   MULTIPLE VITAMIN (MULITIVITAMIN WITH MINERALS) TABS    Take 1  tablet by mouth daily.   TURMERIC 500 MG CAPS    Take 1 tablet by mouth daily.  Modified Medications   No medications on file  Discontinued Medications   No medications on file    Review of patient's allergies indicates no known allergies.  Current Meds  Medication Sig  . Amino Acids (AMINO ACID PO) Take 2 tablets by mouth daily.  . Ascorbic Acid (VITAMIN C) 1000 MG tablet Take 1,000 mg by mouth daily.  . Astaxanthin 4 MG CAPS Take 2 tablets by mouth daily.  . Carnitine 250 MG CAPS Take 2 capsules by mouth daily.  . Cholecalciferol (VITAMIN D3) 2000 units TABS Take 1 capsule by mouth daily.  . Cyanocobalamin ([facility] B-12) 1500 MCG TBDP Take 1 tablet by mouth daily.  . diclofenac sodium (VOLTAREN) 1 % GEL Apply 2 g topically 4 (four) times daily. To affected joint.  Marland Kitchen FISH OIL-KRILL OIL PO Take by mouth.  . folic acid (FOLVITE) A999333 MCG tablet Take 400 mcg by mouth daily.  . Misc Natural Products (TART CHERRY ADVANCED PO) Take 1,200 mg by mouth daily.  . Multiple Vitamin (MULITIVITAMIN WITH MINERALS) TABS Take 1 tablet by mouth daily.  . Turmeric 500 MG CAPS Take 1 tablet by mouth daily.   Review of Systems  Constitutional: Positive for malaise/fatigue.  HENT: Positive for tinnitus.        Chronic problem  Eyes: Negative.   Respiratory: Negative.   Cardiovascular: Negative.   Gastrointestinal: Negative.   Genitourinary: Positive for frequency and urgency.  Musculoskeletal: Positive for joint pain.        Left knee pain, possible joint injection today  Skin: Negative.   Neurological: Negative.   Endo/Heme/Allergies: Negative.   Psychiatric/Behavioral: Negative.     Objective:  Height 5' 3.75" (1.619 m), weight 145 lb 14.4 oz (66.2 kg). Body mass index is 25.24 kg/m.  General: Well Developed, well nourished, and in no acute distress.  HEENT: Normocephalic, atraumatic Skin: Warm and dry, cap RF less 2 sec, good turgor CV: +S1, S2 Respiratory: ECTA B/L; speaking in full  sentences, no conversational dyspnea Abd: Soft, NT, ND, No G/R/R, no SPT, No flank pain Neuro:  A and O *3 M-Sk:  Ambulates w/o assistance, L Knee: Normal to inspection with no erythema or effusion + significant valgus deformity L leg only.  No inc  warmth, swelling vs R appreciated.   + joint line tenderness- lat and medial,  NO patellar tenderness-neg compression test and glide without crepitus, no condyle tenderness. - Full range of motion with pain at end degrees of flexion Ligaments with solid endpoint * 4 + Mcmurray's Psych: mood/ affect appropriate.    Impression and Recommendations:    1. Urinary frequency   2. Urinary urgency   3. Primary osteoarthritis of left knee     1. UA reassuring and no signs UTI; supportive care d./c pt.   - Needs urology eval; referral placed.  2.  Procedure:   -Injection of L knee -Performed by [name], D.O. -Consent obtained after discussion of risks benefits and alternatives of procedure; patient wished to proceed. -Skin prepped in a sterile fashion. -Topical analgesic spray: Ethyl chloride/ FreezEase. - Medial anterior approach used Completed without complication or difficulty. Patient tolerated well. EBL: Less than 1 mL Meds: 2cc 2% lidocaine, 2 cc 0.5% Marcaine ;1 mL Depo-Medrol 40 mg per mL Injection site dressed with sterile bandage. -Pain immediately improved after injection. 0\10 pain scale, suggesting accurate placement of the medication. - Ice 15-20 minutes 3-4 times per day for the next 48 hours and any time after repetitive activity thereafter Advised to call if fevers/chills, erythema, induration, drainage, or bleeding.   Follow-up as discussed  The patient was counseled, risk factors were discussed, anticipatory guidance given.  Gross side effects, risk and benefits, and alternatives of medications discussed with patient.  Patient is aware that all medications have potential side effects and we are unable to  predict every side effect or drug-drug interaction that may occur.  Expresses verbal understanding and consents to current therapy plan and treatment regimen.  Orders Placed This Encounter  Procedures  . Ambulatory referral to Urology  . POCT urinalysis dipstick   New Prescriptions   No medications on file    No Follow-up on file.

## 2016-04-17 DIAGNOSIS — M1712 Unilateral primary osteoarthritis, left knee: Secondary | ICD-10-CM | POA: Diagnosis not present

## 2016-05-01 DIAGNOSIS — M1712 Unilateral primary osteoarthritis, left knee: Secondary | ICD-10-CM | POA: Diagnosis not present

## 2016-05-02 ENCOUNTER — Ambulatory Visit (INDEPENDENT_AMBULATORY_CARE_PROVIDER_SITE_OTHER): Payer: Medicare Other | Admitting: Family Medicine

## 2016-05-02 ENCOUNTER — Encounter: Payer: Self-pay | Admitting: Family Medicine

## 2016-05-02 VITALS — BP 122/76 | HR 66 | Temp 98.2°F | Ht 63.75 in | Wt 147.6 lb

## 2016-05-02 DIAGNOSIS — R0982 Postnasal drip: Secondary | ICD-10-CM

## 2016-05-02 DIAGNOSIS — R05 Cough: Secondary | ICD-10-CM

## 2016-05-02 DIAGNOSIS — B9689 Other specified bacterial agents as the cause of diseases classified elsewhere: Secondary | ICD-10-CM

## 2016-05-02 DIAGNOSIS — J209 Acute bronchitis, unspecified: Secondary | ICD-10-CM | POA: Diagnosis not present

## 2016-05-02 DIAGNOSIS — R059 Cough, unspecified: Secondary | ICD-10-CM

## 2016-05-02 DIAGNOSIS — J019 Acute sinusitis, unspecified: Secondary | ICD-10-CM | POA: Diagnosis not present

## 2016-05-02 MED ORDER — AMOXICILLIN-POT CLAVULANATE 875-125 MG PO TABS: 1.0000 | ORAL_TABLET | Freq: Two times a day (BID) | ORAL | 0 refills | Status: AC

## 2016-05-02 MED ORDER — FLUCONAZOLE 150 MG PO TABS: ORAL_TABLET | ORAL | 1 refills | Status: DC

## 2016-05-02 NOTE — Patient Instructions (Addendum)
AYR or Milta Deiters med sinus rinses twice daily and as needed--- use distilled water  You can use over-the-counter afrin nasal spray for up to 3 days (NO longer than that) which will help acutely with nasal drainage/congestion short term.   Also, sterile saline nasal rinses, such as Milta Deiters med or AYR sinus rinses, can be very helpful and should be done twice daily- even throughout the allergy season.  I would also like you to use an over the counter cold and flu medication such as Tylenol Severe Cold and Sinus or Dayquil/ Nyquil and the like which will help with cough, congestion, pain, and fevers/chills etc.   Wash your hands frequently, as you did not want to get those around you sick as well. Never sneeze or cough on others.  Drink plenty of fluids and stay hydrated, especially if you are running fevers.  We don't know why, but chicken soup also helps, try it! :)

## 2016-05-02 NOTE — Progress Notes (Signed)
Assessment and plan:  1. Acute bacterial rhinosinusitis   2. Early Acute bronchitis   3. Cough   4. Post-nasal drainage     1.  Anticipatory guidance and routine counseling done re: condition, txmnt options and need for follow up. All questions of patient's were answered. - Viral vs Allergic vs Bacterial causes for pt's symptoms reveiwed.    - Supportive care and various OTC medications discussed in addition to any prescribed. - Call or RTC if new symptoms, or if no improvement or worse over next couple days.   - Consider starting ABX at that time if sx continue past 5-7 days and worsening.    New Prescriptions   AMOXICILLIN-CLAVULANATE (AUGMENTIN) 875-125 MG TABLET    Take 1 tablet by mouth 2 (two) times daily.   FLUCONAZOLE (DIFLUCAN) 150 MG TABLET    Take 1 tablet 5 days into treatment then the second tablet at the end of antibiotic treatment   Gross side effects, risk and benefits, and alternatives of medications discussed with patient.  Patient is aware that all medications have potential side effects and we are unable to predict every sideeffect or drug-drug interaction that may occur.  Expresses verbal understanding and consents to current therapy plan and treatment regiment.  Return if symptoms worsen or fail to improve.  Please see AVS handed out to patient at the end of our visit for additional patient instructions/ counseling done pertaining to today's office visit.  Note: This document was prepared using Dragon voice recognition software and may include unintentional dictation errors.    Subjective:    CC: URI  HPI:   "I have bronchitis."  Had cold--> congestion.    Sx started 5 days ago.  [place] and slept in bad conditions- not well rested and got run down.  Chills, no F, occ productive cough, mild sob/ wh but not too bad.  Sx worse in the AM- coughing due to PND. No Pleuritic chest pain..    Patient Active Problem List   Diagnosis Date Noted  . OA  (osteoarthritis) of knee 04/15/2016  . Chronic pain of left knee 04/11/2016  . Senile solar keratosis 03/14/2016  . Peripheral edema- bilateral lower extremities 03/14/2016  . ETD- Sx of L ear 03/14/2016  . h/o Hyperlipidemia, mild 02/24/2016  . h/o low B12- on supp 02/24/2016  . Gen chronic Fatigue 02/24/2016  . Vitamin D deficiency 02/22/2016  . Status post hysterectomy  02/21/2016  . ADD (attention deficit disorder) 02/19/2011  . Decreased hearing 02/19/2011  . thyroid disease- at time did not warrant txmnt 02/19/2011    Past medical history, Surgical history, Family history reviewed and noted below, Social history, Allergies, and Medications have been entered into the medical record, reviewed and changed as needed.   No Known Allergies  Review of Systems  Constitutional: Positive for chills. Negative for diaphoresis, fever, malaise/fatigue and weight loss.  HENT: Positive for congestion and tinnitus.   Eyes: Negative.  Negative for blurred vision, double vision and photophobia.  Respiratory: Positive for cough, sputum production, shortness of breath and wheezing.   Cardiovascular: Negative.  Negative for chest pain and palpitations.  Gastrointestinal: Positive for constipation. Negative for blood in stool, diarrhea, nausea and vomiting.  Genitourinary: Negative.  Negative for dysuria, frequency and urgency.  Musculoskeletal: Negative.  Negative for joint pain and myalgias.  Skin: Negative.  Negative for itching and rash.  Neurological: Negative.  Negative for dizziness, focal weakness, weakness and headaches.  Endo/Heme/Allergies: Positive  for environmental allergies. Negative for polydipsia. Does not bruise/bleed easily.  Psychiatric/Behavioral: Negative for depression and memory loss. The patient has insomnia. The patient is not nervous/anxious.     Objective:   Blood pressure 122/76, pulse 66, temperature 98.2 F (36.8 C), temperature source Oral, height 5' 3.75" (1.619 m),  weight 147 lb 9.6 oz (67 kg). Body mass index is 25.53 kg/m. General: Well Developed, well nourished, appropriate for stated age.  Neuro: Alert and oriented x3, extra-ocular muscles intact, sensation grossly intact.  HEENT: Normocephalic, atraumatic, pupils equal round reactive to light, neck supple, no masses, no painful lymphadenopathy, TM's intact B/L, no acute findings. Nares- patent, clear d/c, OP- clear, mild erythema, No TTP sinuses Skin: Warm and dry, no gross rash. Cardiac: RRR, S1 S2,  no murmurs rubs or gallops.  Respiratory: ECTA B/L and A/P, Not using accessory muscles, speaking in full sentences- unlabored. Vascular:  No gross lower ext edema, cap RF less 2 sec. Psych: No HI/SI, judgement and insight good, Euthymic mood. Full Affect.   Patient Care Team    Relationship Specialty Notifications Start End  [name], DO PCP - General Family Medicine  02/20/16   Elsie Saas, MD Consulting Physician Orthopedic Surgery  04/19/15   Foye Spurling, MD Consulting Physician Endocrinology  02/21/16   Hurman Horn, MD Consulting Physician Ophthalmology  02/21/16   Harriett Sine, MD Consulting Physician Dermatology  03/14/16   Angelia Mould, MD Consulting Physician Vascular Surgery  04/11/16

## 2016-05-22 DIAGNOSIS — N3941 Urge incontinence: Secondary | ICD-10-CM | POA: Diagnosis not present

## 2016-05-22 DIAGNOSIS — R350 Frequency of micturition: Secondary | ICD-10-CM | POA: Diagnosis not present

## 2016-05-22 DIAGNOSIS — R351 Nocturia: Secondary | ICD-10-CM | POA: Diagnosis not present

## 2016-05-24 ENCOUNTER — Encounter: Payer: Self-pay | Admitting: Family Medicine

## 2016-05-24 DIAGNOSIS — R32 Unspecified urinary incontinence: Secondary | ICD-10-CM | POA: Insufficient documentation

## 2016-05-24 DIAGNOSIS — N3941 Urge incontinence: Secondary | ICD-10-CM | POA: Insufficient documentation

## 2016-06-17 DIAGNOSIS — N3941 Urge incontinence: Secondary | ICD-10-CM | POA: Diagnosis not present

## 2016-06-17 DIAGNOSIS — R351 Nocturia: Secondary | ICD-10-CM | POA: Diagnosis not present

## 2016-06-20 DIAGNOSIS — M1712 Unilateral primary osteoarthritis, left knee: Secondary | ICD-10-CM | POA: Diagnosis not present

## 2016-06-24 DIAGNOSIS — S92515A Nondisplaced fracture of proximal phalanx of left lesser toe(s), initial encounter for closed fracture: Secondary | ICD-10-CM | POA: Diagnosis not present

## 2016-06-26 DIAGNOSIS — E039 Hypothyroidism, unspecified: Secondary | ICD-10-CM | POA: Diagnosis not present

## 2016-07-08 DIAGNOSIS — H52213 Irregular astigmatism, bilateral: Secondary | ICD-10-CM | POA: Diagnosis not present

## 2016-07-08 DIAGNOSIS — H1859 Other hereditary corneal dystrophies: Secondary | ICD-10-CM | POA: Diagnosis not present

## 2016-07-08 DIAGNOSIS — H04123 Dry eye syndrome of bilateral lacrimal glands: Secondary | ICD-10-CM | POA: Diagnosis not present

## 2016-07-08 DIAGNOSIS — H35371 Puckering of macula, right eye: Secondary | ICD-10-CM | POA: Diagnosis not present

## 2016-07-08 DIAGNOSIS — Z961 Presence of intraocular lens: Secondary | ICD-10-CM | POA: Diagnosis not present

## 2016-07-17 DIAGNOSIS — S92515D Nondisplaced fracture of proximal phalanx of left lesser toe(s), subsequent encounter for fracture with routine healing: Secondary | ICD-10-CM | POA: Diagnosis not present

## 2016-07-19 ENCOUNTER — Encounter: Payer: Self-pay | Admitting: Family Medicine

## 2016-07-19 ENCOUNTER — Ambulatory Visit (INDEPENDENT_AMBULATORY_CARE_PROVIDER_SITE_OTHER): Payer: Medicare Other | Admitting: Family Medicine

## 2016-07-19 VITALS — BP 127/73 | HR 65 | Ht 63.75 in | Wt 149.3 lb

## 2016-07-19 DIAGNOSIS — Z01810 Encounter for preprocedural cardiovascular examination: Secondary | ICD-10-CM

## 2016-07-19 DIAGNOSIS — Z01818 Encounter for other preprocedural examination: Secondary | ICD-10-CM

## 2016-07-19 DIAGNOSIS — Z789 Other specified health status: Secondary | ICD-10-CM | POA: Diagnosis not present

## 2016-07-19 DIAGNOSIS — Z01811 Encounter for preprocedural respiratory examination: Secondary | ICD-10-CM | POA: Diagnosis not present

## 2016-07-19 DIAGNOSIS — E538 Deficiency of other specified B group vitamins: Secondary | ICD-10-CM

## 2016-07-19 DIAGNOSIS — E559 Vitamin D deficiency, unspecified: Secondary | ICD-10-CM

## 2016-07-19 NOTE — Progress Notes (Signed)
Pre-Op clearance:   - A copy of this will be sent to pt's surgeon Dr. Hector Shade.     - Thank you so much Dr Maureen Ralphs for your consultation and allowing me to participate in the care of this patient.  I would be happy to provide my expertise in the future for similar situations.     Impression and Recommendations:    1. Preoperative general physical examination   2. Pre-operative cardiovascular examination   3. Pre-operative respiratory examination   4. Patient has healthcare proxy and living will   5. h/o low B12- on supp   6. Vitamin D deficiency     Preoperative general physical examination A)  Healthy than average 82 yo caucasian female- w/o h/o HTN, CV dx, smoking/ copd/ breathing issues.  Good exercise tolerance.  No recent pulm issues/ infections etc.  No recent CV issues. Lower risk surgery w/o major fluid shifts anticipated.   P)   -EKG done WNL's  -advanced directives d/c pt.  Bring [place] a copy of them in future for her records.   Did d/c pt that when she goes for procedure they will ask her DNI/ DNR status.  We discussed this today.  - labs Ess WNL's- reviewed today, UTD   - other tests recently done including CXR and Carotid US reviewed- see below  Please follow recommendations put on your preop clearance sheet and sent to your surgeon as well. This includes: 1)  taking adequate amounts of daily water at least 5 bottles daily. Adequate hydration is important peri-operatively 2)  increase cardiovascular fitness to a goal of 30 minutes of moderate intensity aerobic activity daily-walking. Ensure you slowly work your way up to this.  ( Told pt to use a level, forgiving ground like an indoor track only. ) 3) tell anesthesiologist about your "delayed awakening" or "prolonged effects of anestheia" - that "it takes a while for it to wear off".   They can use a short acting Flourane for the general anesthesia and take other precautions.     CHEST  2  VIEW  COMPARISON:  06/24/2012  FINDINGS: Lungs are clear.  No pleural effusion or pneumothorax. The heart is normal in size. Visualized osseous structures are within normal limits. IMPRESSION: Normal chest radiographs.  Electronically Signed   By: Julian Hy M.D.   On: 06/09/2015  CAROTID US:   RIGHT CAROTID ARTERY: Minor echogenic shadowing plaque formation. No hemodynamically significant right ICA stenosis, velocity elevation, or turbulent flow. Degree of narrowing less than 50%. RIGHT VERTEBRAL ARTERY:  Antegrade LEFT CAROTID ARTERY: Similar scattered minor echogenic plaque formation. No hemodynamically significant left ICA stenosis, velocity elevation, or turbulent flow. LEFT VERTEBRAL ARTERY:  Antegrade  IMPRESSION: Minor carotid atherosclerosis. No hemodynamically significant ICA stenosis. Degree of narrowing less than 50% bilaterally. Patent antegrade vertebral flow bilaterally.  Electronically Signed   By: Jerilynn Mages.  Shick M.D.   On: 02/22/2016    - A copy of this will be sent to her physician Dr. Hector Shade.   Thank you so much Dr Maureen Ralphs for your consultation and allowing me to participate in the care of our mutual patient.  I would be happy to provide my expertise in the future for similar situations.     New Prescriptions   No medications on file    Modified Medications   No medications on file    Discontinued Medications   FLUCONAZOLE (DIFLUCAN) 150 MG TABLET    Take 1 tablet 5 days  into treatment then the second tablet at the end of antibiotic treatment   MISC NATURAL PRODUCTS (TART CHERRY ADVANCED PO)    Take 1,200 mg by mouth daily.    The patient was counseled, risk factors were discussed, anticipatory guidance given.  Gross side effects, risk and benefits, and alternatives of medications and treatment plan in general discussed with patient.  Patient is aware that all medications have potential side effects and we are unable to predict  every side effect or drug-drug interaction that may occur.   Patient will call with any questions prior to using medication if they have concerns.  Expresses verbal understanding and consents to current therapy and treatment regimen.  No barriers to understanding were identified.  Red flag symptoms and signs discussed in detail.  Patient expressed understanding regarding what to do in case of emergency\urgent symptoms  Return for as discussed prior for chronic medicla care - q 4-28months. Reminded pt she needs CPE also.  Please see AVS handed out to patient at the end of our visit for further patient instructions/ counseling done pertaining to today's office visit.    Note: This document was prepared using Dragon voice recognition software and may include unintentional dictation errors.   --------------------------------------------------------------------------------------------------------------------------------------------------------------------------------------------------------------------------------------------    Subjective:    CC:  Chief Complaint  Patient presents with  . Pre-op Exam    Seen in consultation for preop clearance at the written request of Dr. Maureen Ralphs- [place] [facility]    HPI: Cheryl Martin is a 82 y.o. healthy female with a history of OA/ degerative jt dx;  without a history of smoking, hypertension, diabetes, or COPD who presents to [facility] today for issues as discussed below.    -  Patient is here today for preop clearance AT THE WRITTEN REQUEST of DR Maureen Ralphs of [facility] orthopedics.  The surgery date is scheduled for 3\5\18.  Having left TKA-medial and lateral with\without patellar resurfacing.   -  EKG was done today and was within normal limits with no acute abnormalities or ST changes rate was around 63 bpm. Normal sinus rhythm.   -  METS:   Pt has good exercise tolerance and can walk up and down stairs without chest pain,  shortness of breath etc.   She can carry grocery bags in and out of the house without CP, SOB, DOE or symptoms.  The only limiting factor per patient is her arthritis in her knee.     - She is a vegetarian and has been for many yrs, eats and lives very healthy; never been on chol or BP meds etc.    - she has her Healthcare POA and living will papers completed and I asked her to bring in a copy for our records.     SHe introduced me to her daughter- who lives in [place] actually but who is here visiting for the holidays who will be her healthcare POA.   Chantra made it very clear to me that her wishes in her living will is that the healthcare providers decide if there is hope for her or not, and we will decide end of life issues for her.    She told me she did not want to leave this burden on the shoulders of her daughter.   Recent blood work end of August showed:  CBC    Component Value Date/Time   WBC 4.1 03/14/2016 1020   RBC 4.55 03/14/2016 1020   HGB 13.7 03/14/2016  1020   HCT 41.6 03/14/2016 1020   PLT 250 03/14/2016 1020   MCV 91.4 03/14/2016 1020   MCH 30.1 03/14/2016 1020   MCHC 32.9 03/14/2016 1020   RDW 13.3 03/14/2016 1020   LYMPHSABS 1,066 03/14/2016 1020   MONOABS 533 03/14/2016 1020   EOSABS 123 03/14/2016 1020   BASOSABS 41 03/14/2016 1020   CMP     Component Value Date/Time   NA 140 03/14/2016 1020   K 4.2 03/14/2016 1020   CL 106 03/14/2016 1020   CO2 27 03/14/2016 1020   GLUCOSE 85 03/14/2016 1020   BUN 13 03/14/2016 1020   CREATININE 0.84 03/14/2016 1020   CALCIUM 9.6 03/14/2016 1020   PROT 6.1 03/14/2016 1020   ALBUMIN 4.2 03/14/2016 1020   AST 19 03/14/2016 1020   ALT 17 03/14/2016 1020   ALKPHOS 99 03/14/2016 1020   BILITOT 0.8 03/14/2016 1020   GFRNONAA 65 03/14/2016 1020   GFRAA 75 03/14/2016 1020   Lipid Panel     Component Value Date/Time   CHOL 231 (H) 03/14/2016 1020   TRIG 87 03/14/2016 1020   HDL 80 03/14/2016 1020   CHOLHDL 2.9  03/14/2016 1020   VLDL 17 03/14/2016 1020   LDLCALC 134 (H) 03/14/2016 1020   Also on 03/14/16:  Vit D:  47  TSH:   1.76  Vit B-12:   >2000, which she has cut back on her supplements/ B12 dose since then be at least half      Wt Readings from Last 3 Encounters:  07/19/16 149 lb 4.8 oz (67.7 kg)  05/02/16 147 lb 9.6 oz (67 kg)  04/15/16 145 lb 14.4 oz (66.2 kg)   BP Readings from Last 3 Encounters:  07/19/16 127/73  05/02/16 122/76  04/11/16 124/72   Pulse Readings from Last 3 Encounters:  07/19/16 65  05/02/16 66  04/11/16 67   BMI Readings from Last 3 Encounters:  07/19/16 25.83 kg/m  05/02/16 25.53 kg/m  04/15/16 25.24 kg/m     Patient Care Team    Relationship Specialty Notifications Start End  [name], DO PCP - General Family Medicine  02/20/16   Elsie Saas, MD Consulting Physician Orthopedic Surgery  04/19/15   Foye Spurling, MD Consulting Physician Endocrinology  02/21/16   Hurman Horn, MD Consulting Physician Ophthalmology  02/21/16   Harriett Sine, MD Consulting Physician Dermatology  03/14/16   Angelia Mould, MD Consulting Physician Vascular Surgery  04/11/16   [name], MD Consulting Physician Orthopedic Surgery  05/20/16     Patient Active Problem List   Diagnosis Date Noted  . Urgency incontinence 05/24/2016    Priority: High  . h/o Hyperlipidemia, mild 02/24/2016    Priority: High  . Vitamin D deficiency 02/22/2016    Priority: High  . ADD (attention deficit disorder) 02/19/2011    Priority: High  . h/o low B12- on supp 02/24/2016    Priority: Medium  . thyroid disease- at time did not warrant txmnt 02/19/2011    Priority: Medium  . Patient has healthcare proxy and living will 07/20/2016    Priority: Low  . OA (osteoarthritis) of knee 04/15/2016    Priority: Low  . Status post hysterectomy  02/21/2016    Priority: Low  . Preoperative general physical examination 07/20/2016  . Chronic pain of left knee  04/11/2016  . Senile solar keratosis 03/14/2016  . Peripheral edema- bilateral lower extremities 03/14/2016  . ETD- Sx of L ear 03/14/2016  .  Gen chronic Fatigue 02/24/2016  . Decreased hearing 02/19/2011    Past Medical history, Surgical history, Family history, Social history, Allergies and Medications have been entered into the medical record, reviewed and changed as needed.   Allergies:  No Known Allergies  Review of Systems  Constitutional: Negative for chills and fever.  HENT: Negative for congestion and sinus pain.   Eyes: Negative for blurred vision and double vision.  Respiratory: Negative for shortness of breath and wheezing.   Cardiovascular: Negative for chest pain, palpitations and orthopnea.  Gastrointestinal: Positive for constipation. Negative for diarrhea, heartburn, nausea and vomiting.       Chronic problem   Genitourinary: Negative for frequency and hematuria.  Musculoskeletal: Positive for joint pain. Negative for falls.       Chronic jt pains in left knee  Skin: Negative for rash.  Neurological: Negative for dizziness and focal weakness.  Endo/Heme/Allergies: Positive for environmental allergies. Negative for polydipsia.  Psychiatric/Behavioral: The patient is not nervous/anxious.      Objective:   Blood pressure 127/73, pulse 65, height 5' 3.75" (1.619 m), weight 149 lb 4.8 oz (67.7 kg). Body mass index is 25.83 kg/m.  Comprehensive physical exam: General: Well Developed, well nourished, appropriate for stated age.  Neuro: Alert and oriented x3, extra-ocular muscles intact, sensation grossly intact.  HEENT: Normocephalic, atraumatic, neck supple, no carotid bruits or JVD appreciated  Skin: no gross rash. Cardiac: RRR, S1 S2, No m Respiratory: ECTA B/L, Not using accessory muscles, speaking in full sentences-unlabored. Vascular:  Ext warm, dry, pink; cap RF less 2 sec.  Non-pit edema 2+b/l lext- equal b/l Psych: No HI/SI, judgement and insight good,  Euthymic mood. Full Affect.    No results found for this or any previous visit (from the past 2160 hour(s)).

## 2016-07-19 NOTE — Patient Instructions (Signed)
Please follow recommendations put on your preop clearance sheet. This includes one taking adequate amounts of daily water at least 5 bottles daily number to increase cardio vascular fitness to a goal of 30 minutes of moderate intensity aerobic activity daily-he slowly work her way up to this. And #3 tell anesthesiologist you're about your delayed awakening so they can use a short acting Florien for the general anesthesia.

## 2016-07-20 DIAGNOSIS — Z01818 Encounter for other preprocedural examination: Secondary | ICD-10-CM | POA: Insufficient documentation

## 2016-07-20 DIAGNOSIS — Z789 Other specified health status: Secondary | ICD-10-CM | POA: Insufficient documentation

## 2016-07-20 NOTE — Assessment & Plan Note (Signed)
   On 03/14/16:  Vit D:  47  - Patient also recently had a bone scan through a commercially driven [place]. Reminded to get me results of this for our records.

## 2016-07-20 NOTE — Assessment & Plan Note (Signed)
   Also on 03/14/16:  Vit B-12:   >2000, which she has cut back on her supplements/ B12 dose since then be at least half

## 2016-07-20 NOTE — Assessment & Plan Note (Addendum)
A)  Healthy than average 82 yo caucasian female- w/o h/o HTN, CV dx, smoking/ copd/ breathing issues.  Good exercise tolerance.  No recent pulm issues/ infections etc.  No recent CV issues. Lower risk surgery w/o major fluid shifts anticipated.   P)   -EKG done WNL's  -advanced directives d/c pt.  Bring [place] a copy of them in future for her records.   Did d/c pt that when she goes for procedure they will ask her DNI/ DNR status.  We discussed this today.  - labs Ess WNL's- reviewed today, UTD   - other tests recently done including CXR and Carotid US reviewed- see below  - Patient was also seen more recently in consultation for her mild venous insufficiency of her bilateral lower extremities on 9\28\16 by Dr. Gae Gallop of vascular surgery.  These were his recommendations: Based on her duplex and exam, she has evidence of mild chronic venous insufficiency bilaterally. We have discussed the importance of intermittent leg elevation in the proper positioning for this. In addition I have given her a prescription for knee-high compression stockings with a gradient of 15-20 mmHg. I've encouraged her to stay active and to avoid prolonged sitting and standing. I'll be happy to see her back at anytime if her swelling progresses or she develops any new vascular issues. I reassured her that she has excellent arterial flow.  Please follow recommendations put on your preop clearance sheet and sent to your surgeon as well. This includes: 1)  taking adequate amounts of daily water at least 5 bottles daily. Adequate hydration is important peri-operatively 2)  increase cardiovascular fitness to a goal of 30 minutes of moderate intensity aerobic activity daily-walking. Ensure you slowly work your way up to this.  ( Told pt to use a level, forgiving ground like an indoor track only. ) 3) tell anesthesiologist about your "delayed awakening" or "prolonged effects of anestheia" - that "it takes a while for it to wear  off".   They can use a short acting Flourane for the general anesthesia and take other precautions.     CHEST  2 VIEW  COMPARISON:  06/24/2012  FINDINGS: Lungs are clear.  No pleural effusion or pneumothorax. The heart is normal in size. Visualized osseous structures are within normal limits. IMPRESSION: Normal chest radiographs.  Electronically Signed   By: Julian Hy M.D.   On: 06/09/2015  CAROTID US:   RIGHT CAROTID ARTERY: Minor echogenic shadowing plaque formation. No hemodynamically significant right ICA stenosis, velocity elevation, or turbulent flow. Degree of narrowing less than 50%. RIGHT VERTEBRAL ARTERY:  Antegrade LEFT CAROTID ARTERY: Similar scattered minor echogenic plaque formation. No hemodynamically significant left ICA stenosis, velocity elevation, or turbulent flow. LEFT VERTEBRAL ARTERY:  Antegrade  IMPRESSION: Minor carotid atherosclerosis. No hemodynamically significant ICA stenosis. Degree of narrowing less than 50% bilaterally. Patent antegrade vertebral flow bilaterally.  Electronically Signed   By: Jerilynn Mages.  Shick M.D.   On: 02/22/2016    - A copy of this will be sent to her physician Dr. Hector Shade.   Thank you so much Dr Maureen Ralphs for your consultation and allowing me to participate in the care of our mutual patient.  I would be happy to provide my expertise in the future for similar situations.

## 2016-08-12 DIAGNOSIS — N3941 Urge incontinence: Secondary | ICD-10-CM | POA: Diagnosis not present

## 2016-08-12 DIAGNOSIS — R351 Nocturia: Secondary | ICD-10-CM | POA: Diagnosis not present

## 2016-08-27 DIAGNOSIS — M1712 Unilateral primary osteoarthritis, left knee: Secondary | ICD-10-CM | POA: Diagnosis not present

## 2016-08-29 ENCOUNTER — Ambulatory Visit: Payer: Self-pay | Admitting: Orthopedic Surgery

## 2016-08-29 NOTE — H&P (Signed)
Cheryl Martin DOB: 08-21-1930 Widowed / Language: Cleophus Molt / Race: White Female Date of Admission:  09/16/2016 CC:  Left Knee Pain History of Present Illness The patient is a 83 year old female who comes in for a preoperative History and Physical. The patient is scheduled for a left total knee arthroplasty to be performed by Dr. Dione Plover. [name], MD at [facility] on 09-16-2016. The patient is a 83 year old female who presented with knee complaints. The patient reports left knee symptoms including: pain (constant), giving way, weakness, stiffness, soreness, grinding and cramping which began 6 month(s) ago without any known injury. The patient describes their pain as aching.The patient feels that the symptoms are worsening. The patient has the current diagnosis of knee osteoarthritis. Previous work-up for this problem has included knee x-rays. Past treatment for this problem has included intra-articular injection of corticosteroids (cortisone injection Aug. 2017 relief for 2 days) and physical therapy (2 days worth of PT GOC w/ Selinda Eon: did help). Symptoms are reported to be located in the left knee and include knee pain and decreased range of motion. Current treatment includes application of heat and application of ice. Cheryl Martin had problems with her knee for quite a while now. It has gotten progressively worse in the past six months to year. Initially, she would have just activity related pain, but now she has got pain in activity and at rest. She has got a progressive valgus deformity and now the knee is starting to give out on her at times. She said the knee has essentially taken over her life and she is at a stage where she needs to do something about it. She is ready to proceed with surgery. They have been treated conservatively in the past for the above stated problem and despite conservative measures, they continue to have progressive pain and severe functional limitations and  dysfunction. They have failed non-operative management including home exercise, medications, and injections. It is felt that they would benefit from undergoing total joint replacement. Risks and benefits of the procedure have been discussed with the patient and they elect to proceed with surgery. There are no active contraindications to surgery such as ongoing infection or rapidly progressive neurological disease.  Problem List/Past Medical Chronic Pain  Hypothyroidism  Ulcer disease  Primary osteoarthritis of left knee (M17.12)  Tinnitus  Bronchitis  Pneumonia  Hypercholesterolemia  Hiatal Hernia  Gastric Ulcer  Hemorrhoids  Urinary Incontinence  Urinary Tract Infection  Degenerative Disc Disease  Measles  Mumps  Menopause  No Known Drug Allergies  Adhesive Tape  She is able to use Paper Tape.  Family History  Cancer  Brother. child Cerebrovascular Accident  Maternal Grandmother. First Degree Relatives  reported Heart Disease  Father.  Social History  Children  5 or more Current work status  retired Furniture conservator/restorer weekly; does other Former drinker  06/20/2016: In the past drank wine Living situation  live with caregiver -Daughter, Arbie Cookey Marital status  widowed No history of drug/alcohol rehab  Not under pain contract  Number of flights of stairs before winded  2-3 Tobacco / smoke exposure  06/20/2016: no Tobacco use  Former smoker. 06/20/2016: smoke(d) less than 1/2 pack(s) per day  Medication History  Oxybutynin Chloride (5MG  Tablet, Oral) Active. Vitamin D3 (1000UNIT Tablet, Oral) Active. Vitamin C (100MG  Tablet, Oral) Active. Vitamin B-12 (1000MCG Tablet, Oral) Active. Multivitamin Active. Omega Q Plus Active. Resveratrol Active. Vitamin B6 Active. Folic Acid Active. I-Cap Active. Beet Root Active.  Turmeric Active. Chloroxygen Active. Palmitoleic Acid Active. Stool Softner Active. Myrbetrig  Active. Miralax Active.   Past Surgical History Appendectomy  Cataract Surgery  bilateral Dilation and Curettage of Uterus  Gallbladder Surgery  Date: 1988. laporoscopic Hysterectomy  Date: 77. partial (non-cancerous) Tonsillectomy  Bladder Tack Procedure  Date: 1995. Hand Surgery  Date: 38.   Review of Systems  General Not Present- Chills, Fatigue, Fever, Memory Loss, Night Sweats, Weight Gain and Weight Loss. Skin Not Present- Eczema, Hives, Itching, Lesions and Rash. HEENT Present- Tinnitus. Not Present- Dentures, Double Vision, Headache, Hearing Loss and Visual Loss. Respiratory Not Present- Allergies, Chronic Cough, Coughing up blood, Shortness of breath at rest and Shortness of breath with exertion. Cardiovascular Present- Swelling. Not Present- Chest Pain, Difficulty Breathing Lying Down, Murmur, Palpitations and Racing/skipping heartbeats. Gastrointestinal Present- Constipation. Not Present- Abdominal Pain, Bloody Stool, Diarrhea, Difficulty Swallowing, Heartburn, Jaundice, Loss of appetitie, Nausea and Vomiting. Female Genitourinary Present- Urinary frequency and Urinating at Night. Not Present- Blood in Urine, Discharge, Flank Pain, Incontinence, Painful Urination, Urgency, Urinary Retention and Weak urinary stream. Musculoskeletal Present- Back Pain, Joint Pain and Joint Swelling. Not Present- Morning Stiffness, Muscle Pain, Muscle Weakness and Spasms. Neurological Not Present- Blackout spells, Difficulty with balance, Dizziness, Paralysis, Tremor and Weakness. Psychiatric Not Present- Insomnia.  Vitals  Weight: 145 lb Height: 63.75in Weight was reported by patient. Height was reported by patient. Body Surface Area: 1.7 m Body Mass Index: 25.08 kg/m  Pulse: 68 (Regular)  BP: 126/62 (Sitting, Right Arm, Standard)   Physical Exam  General Mental Status -Alert, cooperative and good historian. General Appearance-pleasant, Not in acute  distress. Orientation-Oriented X3. Build & Nutrition-Well nourished and Well developed.  Head and Neck Head-normocephalic, atraumatic . Neck Global Assessment - supple, no bruit auscultated on the right, no bruit auscultated on the left.  Eye Vision-Wears corrective lenses. Pupil - Bilateral-Regular and Round. Motion - Bilateral-EOMI.  ENMT Note: upper and lower denture plates   Chest and Lung Exam Auscultation Breath sounds - clear at anterior chest wall and clear at posterior chest wall. Adventitious sounds - No Adventitious sounds.  Cardiovascular Auscultation Rhythm - Regular rate and rhythm. Heart Sounds - S1 WNL and S2 WNL. Murmurs & Other Heart Sounds - Auscultation of the heart reveals - No Murmurs.  Abdomen Palpation/Percussion Tenderness - Abdomen is non-tender to palpation. Rigidity (guarding) - Abdomen is soft. Auscultation Auscultation of the abdomen reveals - Bowel sounds normal.  Female Genitourinary Note: Not done, not pertinent to present illness   Musculoskeletal Note: On exam, she is alert and oriented, in no apparent distress. Evaluation of her hip show normal range of motion with no discomfort. Evaluation of her right knee shows no effusion. Her range of motion is 0 to 130. She has slight crepitus on range of motion. No tenderness or instability. Left knee valgus deformity. Range about 5 to 125. Marked crepitus on range of motion with tenderness lateral greater than medial, no instability noted. Pulse, sensation, motor intact both lower extremities.  RADIOGRAPHS AP and lateral of both knees show that she has got bone-on-bone arthritis, lateral and patellofemoral compartments of the left knee with valgus deformity of about 15 degrees. Right knee shows some slight degenerative change.   Assessment & Plan Primary osteoarthritis of left knee (M17.12)  Note:Surgical Plans: Left Total Knee Replacement  Disposition: Home with Daughter,  [facility]Home [facility]  PCP: Dr. Raliegh Scarlet - Patient has been seen preoperatively and felt to be stable for surgery. "Drink adequate water daily 5  bottles QD, Increase CV fitness to goal of 30 daily, work slowly up to this, tell anesthesiologist about your delayed awakening so they can use short acting flouranes for their gen. anesthesia."  IV TXA  Anesthesia Issues: None excpet slow to wake up and sensitive  Patient was instructed on what medications to stop prior to surgery.  Signed electronically by Ok Edwards, III PA-C

## 2016-08-30 ENCOUNTER — Ambulatory Visit: Payer: Self-pay | Admitting: Orthopedic Surgery

## 2016-09-02 DIAGNOSIS — N3941 Urge incontinence: Secondary | ICD-10-CM | POA: Diagnosis not present

## 2016-09-02 DIAGNOSIS — R351 Nocturia: Secondary | ICD-10-CM | POA: Diagnosis not present

## 2016-09-05 NOTE — Patient Instructions (Signed)
Cheryl Martin  09/05/2016   Your procedure is scheduled on: 09/16/16  Report to [facility] Main  Entrance take [place]  elevators to 3rd floor to  [place] at    [phone] AM.  Call this number if you have problems the morning of surgery [phone]   Remember: ONLY 1 PERSON MAY GO WITH YOU TO SHORT STAY TO GET  READY MORNING OF [place].  Do not eat food or drink liquids :After Midnight.     Take these medicines the morning of surgery with A SIP OF WATER: NONE                                You may not have any metal on your body including hair pins and              piercings  Do not wear jewelry, make-up, lotions, powders or perfumes, deodorant             Do not wear nail polish.  Do not shave  48 hours prior to surgery.                Do not bring valuables to the hospital. [place].  Contacts, dentures or bridgework may not be worn into surgery.  Leave suitcase in the car. After surgery it may be brought to your room.                  Please read over the following fact sheets you were given: _____________________________________________________________________             [facility] - Preparing for Surgery Before surgery, you can play an important role.  Because skin is not sterile, your skin needs to be as free of germs as possible.  You can reduce the number of germs on your skin by washing with CHG (chlorahexidine gluconate) soap before surgery.  CHG is an antiseptic cleaner which kills germs and bonds with the skin to continue killing germs even after washing. Please DO NOT use if you have an allergy to CHG or antibacterial soaps.  If your skin becomes reddened/irritated stop using the CHG and inform your nurse when you arrive at Short Stay. Do not shave (including legs and underarms) for at least 48 hours prior to the first CHG shower.  You may shave your face/neck. Please follow these  instructions carefully:  1.  Shower with CHG Soap the night before surgery and the  morning of Surgery.  2.  If you choose to wash your hair, wash your hair first as usual with your  normal  shampoo.  3.  After you shampoo, rinse your hair and body thoroughly to remove the  shampoo.                           4.  Use CHG as you would any other liquid soap.  You can apply chg directly  to the skin and wash                       Gently with a scrungie or clean washcloth.  5.  Apply the CHG Soap to your body  ONLY FROM THE NECK DOWN.   Do not use on face/ open                           Wound or open sores. Avoid contact with eyes, ears mouth and genitals (private parts).                       Wash face,  Genitals (private parts) with your normal soap.             6.  Wash thoroughly, paying special attention to the area where your surgery  will be performed.  7.  Thoroughly rinse your body with warm water from the neck down.  8.  DO NOT shower/wash with your normal soap after using and rinsing off  the CHG Soap.                9.  Pat yourself dry with a clean towel.            10.  Wear clean pajamas.            11.  Place clean sheets on your bed the night of your first shower and do not  sleep with pets. Day of Surgery : Do not apply any lotions/deodorants the morning of surgery.  Please wear clean clothes to the hospital/surgery center.  FAILURE TO FOLLOW THESE INSTRUCTIONS MAY RESULT IN THE CANCELLATION OF YOUR SURGERY PATIENT SIGNATURE_________________________________  NURSE SIGNATURE__________________________________  ________________________________________________________________________  WHAT IS A BLOOD TRANSFUSION? Blood Transfusion Information  A transfusion is the replacement of blood or some of its parts. Blood is made up of multiple cells which provide different functions.  Red blood cells carry oxygen and are used for blood loss replacement.  White blood cells fight against  infection.  Platelets control bleeding.  Plasma helps clot blood.  Other blood products are available for specialized needs, such as hemophilia or other clotting disorders. BEFORE THE TRANSFUSION  Who gives blood for transfusions?   Healthy volunteers who are fully evaluated to make sure their blood is safe. This is blood bank blood. Transfusion therapy is the safest it has ever been in the practice of medicine. Before blood is taken from a donor, a complete history is taken to make sure that person has no history of diseases nor engages in risky social behavior (examples are intravenous drug use or sexual activity with multiple partners). The donor's travel history is screened to minimize risk of transmitting infections, such as malaria. The donated blood is tested for signs of infectious diseases, such as HIV and hepatitis. The blood is then tested to be sure it is compatible with you in order to minimize the chance of a transfusion reaction. If you or a relative donates blood, this is often done in anticipation of surgery and is not appropriate for emergency situations. It takes many days to process the donated blood. RISKS AND COMPLICATIONS Although transfusion therapy is very safe and saves many lives, the main dangers of transfusion include:   Getting an infectious disease.  Developing a transfusion reaction. This is an allergic reaction to something in the blood you were given. Every precaution is taken to prevent this. The decision to have a blood transfusion has been considered carefully by your caregiver before blood is given. Blood is not given unless the benefits outweigh the risks. AFTER THE TRANSFUSION  Right after receiving a blood transfusion, you will usually feel much  better and more energetic. This is especially true if your red blood cells have gotten low (anemic). The transfusion raises the level of the red blood cells which carry oxygen, and this usually causes an energy  increase.  The nurse administering the transfusion will monitor you carefully for complications. HOME CARE INSTRUCTIONS  No special instructions are needed after a transfusion. You may find your energy is better. Speak with your caregiver about any limitations on activity for underlying diseases you may have. SEEK MEDICAL CARE IF:   Your condition is not improving after your transfusion.  You develop redness or irritation at the intravenous (IV) site. SEEK IMMEDIATE MEDICAL CARE IF:  Any of the following symptoms occur over the next 12 hours:  Shaking chills.  You have a temperature by mouth above 102 F (38.9 C), not controlled by medicine.  Chest, back, or muscle pain.  People around you feel you are not acting correctly or are confused.  Shortness of breath or difficulty breathing.  Dizziness and fainting.  You get a rash or develop hives.  You have a decrease in urine output.  Your urine turns a dark color or changes to pink, red, or brown. Any of the following symptoms occur over the next 10 days:  You have a temperature by mouth above 102 F (38.9 C), not controlled by medicine.  Shortness of breath.  Weakness after normal activity.  The white part of the eye turns yellow (jaundice).  You have a decrease in the amount of urine or are urinating less often.  Your urine turns a dark color or changes to pink, red, or brown. Document Released: 07/05/2000 Document Revised: 09/30/2011 Document Reviewed: 02/22/2008 ExitCare Patient Information 2014 [place].  _______________________________________________________________________  Incentive Spirometer  An incentive spirometer is a tool that can help keep your lungs clear and active. This tool measures how well you are filling your lungs with each breath. Taking long deep breaths may help reverse or decrease the chance of developing breathing (pulmonary) problems (especially infection) following:  A long  period of time when you are unable to move or be active. BEFORE THE PROCEDURE   If the spirometer includes an indicator to show your best effort, your nurse or respiratory therapist will set it to a desired goal.  If possible, sit up straight or lean slightly forward. Try not to slouch.  Hold the incentive spirometer in an upright position. INSTRUCTIONS FOR USE  1. Sit on the edge of your bed if possible, or sit up as far as you can in bed or on a chair. 2. Hold the incentive spirometer in an upright position. 3. Breathe out normally. 4. Place the mouthpiece in your mouth and seal your lips tightly around it. 5. Breathe in slowly and as deeply as possible, raising the piston or the ball toward the top of the column. 6. Hold your breath for 3-5 seconds or for as long as possible. Allow the piston or ball to fall to the bottom of the column. 7. Remove the mouthpiece from your mouth and breathe out normally. 8. Rest for a few seconds and repeat Steps 1 through 7 at least 10 times every 1-2 hours when you are awake. Take your time and take a few normal breaths between deep breaths. 9. The spirometer may include an indicator to show your best effort. Use the indicator as a goal to work toward during each repetition. 10. After each set of 10 deep breaths, practice coughing to be sure  your lungs are clear. If you have an incision (the cut made at the time of surgery), support your incision when coughing by placing a pillow or rolled up towels firmly against it. Once you are able to get out of bed, walk around indoors and cough well. You may stop using the incentive spirometer when instructed by your caregiver.  RISKS AND COMPLICATIONS  Take your time so you do not get dizzy or light-headed.  If you are in pain, you may need to take or ask for pain medication before doing incentive spirometry. It is harder to take a deep breath if you are having pain. AFTER USE  Rest and breathe slowly and  easily.  It can be helpful to keep track of a log of your progress. Your caregiver can provide you with a simple table to help with this. If you are using the spirometer at home, follow these instructions: [place] IF:   You are having difficultly using the spirometer.  You have trouble using the spirometer as often as instructed.  Your pain medication is not giving enough relief while using the spirometer.  You develop fever of 100.5 F (38.1 C) or higher. SEEK IMMEDIATE MEDICAL CARE IF:   You cough up bloody sputum that had not been present before.  You develop fever of 102 F (38.9 C) or greater.  You develop worsening pain at or near the incision site. MAKE SURE YOU:   Understand these instructions.  Will watch your condition.  Will get help right away if you are not doing well or get worse. Document Released: 11/18/2006 Document Revised: 09/30/2011 Document Reviewed: 01/19/2007 [facility] : [facility] Patient Information 2014 [place], [place].   ________________________________________________________________________

## 2016-09-09 ENCOUNTER — Encounter (HOSPITAL_COMMUNITY): Payer: Self-pay

## 2016-09-09 ENCOUNTER — Encounter (HOSPITAL_COMMUNITY)
Admission: RE | Admit: 2016-09-09 | Discharge: 2016-09-09 | Disposition: A | Discharge status: Home / Self Care | Payer: Medicare Other | Source: Ambulatory Visit | Attending: Orthopedic Surgery | Admitting: Orthopedic Surgery

## 2016-09-09 DIAGNOSIS — M1712 Unilateral primary osteoarthritis, left knee: Secondary | ICD-10-CM | POA: Diagnosis not present

## 2016-09-09 DIAGNOSIS — Z0183 Encounter for blood typing: Secondary | ICD-10-CM | POA: Insufficient documentation

## 2016-09-09 DIAGNOSIS — Z01812 Encounter for preprocedural laboratory examination: Secondary | ICD-10-CM | POA: Insufficient documentation

## 2016-09-09 HISTORY — DX: Other complications of anesthesia, initial encounter: T88.59XA

## 2016-09-09 HISTORY — DX: Anemia, unspecified: D64.9

## 2016-09-09 HISTORY — DX: Hypothyroidism, unspecified: E03.9

## 2016-09-09 HISTORY — DX: Personal history of other diseases of the digestive system: Z87.19

## 2016-09-09 HISTORY — DX: Nausea with vomiting, unspecified: Z98.890

## 2016-09-09 HISTORY — DX: Nausea with vomiting, unspecified: R11.2

## 2016-09-09 HISTORY — DX: Unspecified osteoarthritis, unspecified site: M19.90

## 2016-09-09 HISTORY — DX: Other specified postprocedural states: Z98.890

## 2016-09-09 HISTORY — DX: Pneumonia, unspecified organism: J18.9

## 2016-09-09 HISTORY — DX: Adverse effect of unspecified anesthetic, initial encounter: T41.45XA

## 2016-09-09 LAB — COMPREHENSIVE METABOLIC PANEL
ALT: 17 U/L (ref 14–54)
AST: 22 U/L (ref 15–41)
Albumin: 4.1 g/dL (ref 3.5–5.0)
Alkaline Phosphatase: 79 U/L (ref 38–126)
Anion gap: 5 (ref 5–15)
BUN: 24 mg/dL — AB (ref 6–20)
CHLORIDE: 108 mmol/L (ref 101–111)
CO2: 27 mmol/L (ref 22–32)
CREATININE: 0.74 mg/dL (ref 0.44–1.00)
Calcium: 9.6 mg/dL (ref 8.9–10.3)
GFR calc Af Amer: >60 mL/min (ref >60)
Glucose, Bld: 90 mg/dL (ref 65–99)
Potassium: 4.9 mmol/L (ref 3.5–5.1)
SODIUM: 140 mmol/L (ref 135–145)
Total Bilirubin: 0.3 mg/dL (ref 0.3–1.2)
Total Protein: 6.7 g/dL (ref 6.5–8.1)

## 2016-09-09 LAB — PROTIME-INR
INR: 1.02
Prothrombin Time: 13.4 seconds (ref 11.4–15.2)

## 2016-09-09 LAB — SURGICAL PCR SCREEN
MRSA, PCR: NEGATIVE
STAPHYLOCOCCUS AUREUS: NEGATIVE

## 2016-09-09 LAB — CBC
HCT: 38.3 % (ref 36.0–46.0)
Hemoglobin: 13.2 g/dL (ref 12.0–15.0)
MCH: 31.1 pg (ref 26.0–34.0)
MCHC: 34.5 g/dL (ref 30.0–36.0)
MCV: 90.1 fL (ref 78.0–100.0)
Platelets: 209 10*3/uL (ref 150–400)
RBC: 4.25 MIL/uL (ref 3.87–5.11)
RDW: 12.5 % (ref 11.5–15.5)
WBC: 5 10*3/uL (ref 4.0–10.5)

## 2016-09-09 LAB — ABO/RH: ABO/RH(D): A POS

## 2016-09-09 LAB — APTT: aPTT: 28 seconds (ref 24–36)

## 2016-09-09 NOTE — Progress Notes (Signed)
CMP done 09/09/16 routed to Dr. Alvan Dame via epic

## 2016-09-09 NOTE — Progress Notes (Signed)
EKG 06/29/16  Carotids 02/22/16 LOV 07/20/16  All in epic Clearance 07/19/16 on chart  Living will HCPOA on chart

## 2016-09-16 ENCOUNTER — Inpatient Hospital Stay (HOSPITAL_COMMUNITY)
Admission: RE | Admit: 2016-09-16 | Discharge: 2016-09-18 | DRG: 470 | Disposition: A | Discharge status: Home / Self Care | Payer: Medicare Other | Source: Ambulatory Visit | Attending: Orthopedic Surgery | Admitting: Orthopedic Surgery

## 2016-09-16 ENCOUNTER — Inpatient Hospital Stay (HOSPITAL_COMMUNITY): Payer: Medicare Other | Admitting: Registered Nurse

## 2016-09-16 ENCOUNTER — Encounter (HOSPITAL_COMMUNITY)
Admission: RE | Disposition: A | Discharge status: Home / Self Care | Payer: Self-pay | Source: Ambulatory Visit | Attending: Orthopedic Surgery

## 2016-09-16 ENCOUNTER — Encounter (HOSPITAL_COMMUNITY): Payer: Self-pay | Admitting: *Deleted

## 2016-09-16 DIAGNOSIS — F909 Attention-deficit hyperactivity disorder, unspecified type: Secondary | ICD-10-CM | POA: Diagnosis not present

## 2016-09-16 DIAGNOSIS — M179 Osteoarthritis of knee, unspecified: Secondary | ICD-10-CM

## 2016-09-16 DIAGNOSIS — Z87891 Personal history of nicotine dependence: Secondary | ICD-10-CM | POA: Diagnosis not present

## 2016-09-16 DIAGNOSIS — G8918 Other acute postprocedural pain: Secondary | ICD-10-CM | POA: Diagnosis not present

## 2016-09-16 DIAGNOSIS — Z91048 Other nonmedicinal substance allergy status: Secondary | ICD-10-CM | POA: Diagnosis not present

## 2016-09-16 DIAGNOSIS — M1712 Unilateral primary osteoarthritis, left knee: Secondary | ICD-10-CM | POA: Diagnosis not present

## 2016-09-16 DIAGNOSIS — E7800 Pure hypercholesterolemia, unspecified: Secondary | ICD-10-CM | POA: Diagnosis not present

## 2016-09-16 DIAGNOSIS — M25562 Pain in left knee: Secondary | ICD-10-CM | POA: Diagnosis not present

## 2016-09-16 DIAGNOSIS — M1710 Unilateral primary osteoarthritis, unspecified knee: Secondary | ICD-10-CM

## 2016-09-16 HISTORY — PX: TOTAL KNEE ARTHROPLASTY: SHX125

## 2016-09-16 HISTORY — PX: JOINT REPLACEMENT: SHX530

## 2016-09-16 LAB — TYPE AND SCREEN
ABO/RH(D): A POS
Antibody Screen: NEGATIVE

## 2016-09-16 SURGERY — ARTHROPLASTY, KNEE, TOTAL
Anesthesia: Spinal | Site: Knee | Laterality: Left

## 2016-09-16 MED ORDER — ACETAMINOPHEN 10 MG/ML IV SOLN
1000.0000 mg | Freq: Once | INTRAVENOUS | Status: AC
  Administered 2016-09-16: 1000 mg via INTRAVENOUS
  Filled 2016-09-16: qty 100

## 2016-09-16 MED ORDER — LIDOCAINE 2% (20 MG/ML) 5 ML SYRINGE
INTRAMUSCULAR | Status: DC | PRN
  Administered 2016-09-16: 100 mg via INTRAVENOUS

## 2016-09-16 MED ORDER — LIDOCAINE 2% (20 MG/ML) 5 ML SYRINGE
INTRAMUSCULAR | Status: AC
  Filled 2016-09-16: qty 5

## 2016-09-16 MED ORDER — ONDANSETRON HCL 4 MG/2ML IJ SOLN
INTRAMUSCULAR | Status: DC | PRN
  Administered 2016-09-16: 4 mg via INTRAVENOUS

## 2016-09-16 MED ORDER — CEFAZOLIN SODIUM-DEXTROSE 2-4 GM/100ML-% IV SOLN: 2.0000 g | INTRAVENOUS | Status: DC

## 2016-09-16 MED ORDER — ACETAMINOPHEN 650 MG RE SUPP: 650.0000 mg | Freq: Four times a day (QID) | RECTAL | Status: DC | PRN

## 2016-09-16 MED ORDER — LACTATED RINGERS IV SOLN: INTRAVENOUS | Status: DC

## 2016-09-16 MED ORDER — ONDANSETRON HCL 4 MG PO TABS
4.0000 mg | ORAL_TABLET | Freq: Four times a day (QID) | ORAL | Status: DC | PRN
  Filled 2016-09-16: qty 1

## 2016-09-16 MED ORDER — OXYCODONE HCL 5 MG PO TABS
5.0000 mg | ORAL_TABLET | ORAL | Status: DC | PRN
  Administered 2016-09-16: 5 mg via ORAL
  Administered 2016-09-16 – 2016-09-17 (×7): 10 mg via ORAL
  Administered 2016-09-18: 5 mg via ORAL
  Administered 2016-09-18 (×2): 10 mg via ORAL
  Filled 2016-09-16 (×5): qty 2
  Filled 2016-09-16 (×2): qty 1
  Filled 2016-09-16 (×5): qty 2

## 2016-09-16 MED ORDER — METOCLOPRAMIDE HCL 5 MG/ML IJ SOLN: 5.0000 mg | Freq: Three times a day (TID) | INTRAMUSCULAR | Status: DC | PRN

## 2016-09-16 MED ORDER — METHOCARBAMOL 1000 MG/10ML IJ SOLN
500.0000 mg | Freq: Four times a day (QID) | INTRAVENOUS | Status: DC | PRN
  Administered 2016-09-16: 500 mg via INTRAVENOUS
  Filled 2016-09-16: qty 5
  Filled 2016-09-16: qty 550

## 2016-09-16 MED ORDER — DEXAMETHASONE SODIUM PHOSPHATE 10 MG/ML IJ SOLN
10.0000 mg | Freq: Once | INTRAMUSCULAR | Status: AC
  Administered 2016-09-16: 10 mg via INTRAVENOUS

## 2016-09-16 MED ORDER — TRANEXAMIC ACID 1000 MG/10ML IV SOLN
1000.0000 mg | Freq: Once | INTRAVENOUS | Status: AC
  Administered 2016-09-16: 1000 mg via INTRAVENOUS
  Filled 2016-09-16: qty 1100

## 2016-09-16 MED ORDER — BUPIVACAINE IN DEXTROSE 0.75-8.25 % IT SOLN
INTRATHECAL | Status: DC | PRN
  Administered 2016-09-16: 1.6 mL via INTRATHECAL

## 2016-09-16 MED ORDER — DEXAMETHASONE SODIUM PHOSPHATE 10 MG/ML IJ SOLN: 10.0000 mg | Freq: Once | INTRAMUSCULAR | Status: DC

## 2016-09-16 MED ORDER — EPHEDRINE SULFATE-NACL 50-0.9 MG/10ML-% IV SOSY
PREFILLED_SYRINGE | INTRAVENOUS | Status: DC | PRN
  Administered 2016-09-16 (×3): 5 mg via INTRAVENOUS

## 2016-09-16 MED ORDER — ONDANSETRON HCL 4 MG/2ML IJ SOLN
INTRAMUSCULAR | Status: AC
Start: 2016-09-16 — End: 2016-09-16
  Filled 2016-09-16: qty 2

## 2016-09-16 MED ORDER — DEXAMETHASONE SODIUM PHOSPHATE 10 MG/ML IJ SOLN
INTRAMUSCULAR | Status: AC
  Filled 2016-09-16: qty 1

## 2016-09-16 MED ORDER — MIDAZOLAM HCL 2 MG/2ML IJ SOLN
INTRAMUSCULAR | Status: AC
  Filled 2016-09-16: qty 2

## 2016-09-16 MED ORDER — SODIUM CHLORIDE 0.9 % IJ SOLN
INTRAMUSCULAR | Status: DC | PRN
  Administered 2016-09-16: 30 mL

## 2016-09-16 MED ORDER — ACETAMINOPHEN 500 MG PO TABS
1000.0000 mg | ORAL_TABLET | Freq: Four times a day (QID) | ORAL | Status: AC
  Administered 2016-09-16 – 2016-09-17 (×4): 1000 mg via ORAL
  Filled 2016-09-16 (×4): qty 2

## 2016-09-16 MED ORDER — CEFAZOLIN SODIUM-DEXTROSE 2-4 GM/100ML-% IV SOLN
2.0000 g | INTRAVENOUS | Status: AC
  Administered 2016-09-16: 2 g via INTRAVENOUS
  Filled 2016-09-16: qty 100

## 2016-09-16 MED ORDER — BUPIVACAINE LIPOSOME 1.3 % IJ SUSP: 20.0000 mL | Freq: Once | INTRAMUSCULAR | Status: DC

## 2016-09-16 MED ORDER — CHLORHEXIDINE GLUCONATE 4 % EX LIQD: 60.0000 mL | Freq: Once | CUTANEOUS | Status: DC

## 2016-09-16 MED ORDER — PROPOFOL 500 MG/50ML IV EMUL
INTRAVENOUS | Status: DC | PRN
  Administered 2016-09-16: 35 ug/kg/min via INTRAVENOUS

## 2016-09-16 MED ORDER — MIDAZOLAM HCL 2 MG/2ML IJ SOLN
0.5000 mg | Freq: Once | INTRAMUSCULAR | Status: AC
Start: 2016-09-16 — End: 2016-09-16
  Administered 2016-09-16: 0.5 mg via INTRAVENOUS

## 2016-09-16 MED ORDER — MIRABEGRON ER 50 MG PO TB24
50.0000 mg | ORAL_TABLET | Freq: Every day | ORAL | Status: DC
  Administered 2016-09-17 – 2016-09-18 (×2): 50 mg via ORAL
  Filled 2016-09-16 (×2): qty 1

## 2016-09-16 MED ORDER — DIPHENHYDRAMINE HCL 12.5 MG/5ML PO ELIX
12.5000 mg | ORAL_SOLUTION | ORAL | Status: DC | PRN
  Administered 2016-09-17 – 2016-09-18 (×3): 12.5 mg via ORAL
  Filled 2016-09-16 (×3): qty 5

## 2016-09-16 MED ORDER — ROPIVACAINE HCL 7.5 MG/ML IJ SOLN
INTRAMUSCULAR | Status: DC | PRN
  Administered 2016-09-16: 20 mL via PERINEURAL

## 2016-09-16 MED ORDER — RIVAROXABAN 10 MG PO TABS
10.0000 mg | ORAL_TABLET | Freq: Every day | ORAL | Status: DC
  Administered 2016-09-17 – 2016-09-18 (×2): 10 mg via ORAL
  Filled 2016-09-16 (×2): qty 1

## 2016-09-16 MED ORDER — PROPOFOL 10 MG/ML IV BOLUS
INTRAVENOUS | Status: AC
  Filled 2016-09-16: qty 40

## 2016-09-16 MED ORDER — DEXAMETHASONE SODIUM PHOSPHATE 10 MG/ML IJ SOLN
10.0000 mg | Freq: Once | INTRAMUSCULAR | Status: AC
  Administered 2016-09-17: 10 mg via INTRAVENOUS
  Filled 2016-09-16: qty 1

## 2016-09-16 MED ORDER — HYDROMORPHONE HCL 1 MG/ML IJ SOLN: 0.2500 mg | INTRAMUSCULAR | Status: DC | PRN

## 2016-09-16 MED ORDER — SODIUM CHLORIDE 0.9 % IJ SOLN
INTRAMUSCULAR | Status: AC
  Filled 2016-09-16: qty 50

## 2016-09-16 MED ORDER — FLEET ENEMA 7-19 GM/118ML RE ENEM: 1.0000 | ENEMA | Freq: Once | RECTAL | Status: DC | PRN

## 2016-09-16 MED ORDER — MORPHINE SULFATE (PF) 4 MG/ML IV SOLN
1.0000 mg | INTRAVENOUS | Status: DC | PRN
  Administered 2016-09-16 (×2): 1 mg via INTRAVENOUS
  Filled 2016-09-16 (×2): qty 1

## 2016-09-16 MED ORDER — BISACODYL 10 MG RE SUPP
10.0000 mg | Freq: Every day | RECTAL | Status: DC | PRN
  Filled 2016-09-16: qty 1

## 2016-09-16 MED ORDER — LACTATED RINGERS IV SOLN
INTRAVENOUS | Status: DC
  Administered 2016-09-16: 08:00:00 via INTRAVENOUS

## 2016-09-16 MED ORDER — METOCLOPRAMIDE HCL 5 MG PO TABS: 5.0000 mg | ORAL_TABLET | Freq: Three times a day (TID) | ORAL | Status: DC | PRN

## 2016-09-16 MED ORDER — ACETAMINOPHEN 325 MG PO TABS: 650.0000 mg | ORAL_TABLET | Freq: Four times a day (QID) | ORAL | Status: DC | PRN

## 2016-09-16 MED ORDER — METHOCARBAMOL 500 MG PO TABS
500.0000 mg | ORAL_TABLET | Freq: Four times a day (QID) | ORAL | Status: DC | PRN
  Administered 2016-09-16 – 2016-09-17 (×3): 500 mg via ORAL
  Filled 2016-09-16 (×3): qty 1

## 2016-09-16 MED ORDER — TRANEXAMIC ACID 1000 MG/10ML IV SOLN
1000.0000 mg | INTRAVENOUS | Status: AC
  Administered 2016-09-16: 1000 mg via INTRAVENOUS
  Filled 2016-09-16: qty 1100

## 2016-09-16 MED ORDER — SODIUM CHLORIDE 0.9 % IV SOLN
INTRAVENOUS | Status: DC
  Administered 2016-09-16 – 2016-09-17 (×2): via INTRAVENOUS

## 2016-09-16 MED ORDER — BUPIVACAINE LIPOSOME 1.3 % IJ SUSP
20.0000 mL | Freq: Once | INTRAMUSCULAR | Status: DC
  Filled 2016-09-16: qty 20

## 2016-09-16 MED ORDER — CEFAZOLIN SODIUM-DEXTROSE 2-4 GM/100ML-% IV SOLN
2.0000 g | Freq: Four times a day (QID) | INTRAVENOUS | Status: AC
  Administered 2016-09-16 (×2): 2 g via INTRAVENOUS
  Filled 2016-09-16 (×2): qty 100

## 2016-09-16 MED ORDER — PHENOL 1.4 % MT LIQD
1.0000 | OROMUCOSAL | Status: DC | PRN
  Filled 2016-09-16: qty 177

## 2016-09-16 MED ORDER — POLYETHYLENE GLYCOL 3350 17 G PO PACK
17.0000 g | PACK | Freq: Every day | ORAL | Status: DC | PRN
  Administered 2016-09-16 – 2016-09-18 (×2): 17 g via ORAL
  Filled 2016-09-16 (×2): qty 1

## 2016-09-16 MED ORDER — EPHEDRINE 5 MG/ML INJ
INTRAVENOUS | Status: AC
  Filled 2016-09-16: qty 10

## 2016-09-16 MED ORDER — DOCUSATE SODIUM 100 MG PO CAPS
100.0000 mg | ORAL_CAPSULE | Freq: Two times a day (BID) | ORAL | Status: DC
  Administered 2016-09-16 – 2016-09-18 (×4): 100 mg via ORAL
  Filled 2016-09-16 (×4): qty 1

## 2016-09-16 MED ORDER — BUPIVACAINE LIPOSOME 1.3 % IJ SUSP
INTRAMUSCULAR | Status: DC | PRN
  Administered 2016-09-16: 20 mL

## 2016-09-16 MED ORDER — TRAMADOL HCL 50 MG PO TABS: 50.0000 mg | ORAL_TABLET | Freq: Four times a day (QID) | ORAL | Status: DC | PRN

## 2016-09-16 MED ORDER — MENTHOL 3 MG MT LOZG: 1.0000 | LOZENGE | OROMUCOSAL | Status: DC | PRN

## 2016-09-16 MED ORDER — ACETAMINOPHEN 10 MG/ML IV SOLN: 1000.0000 mg | Freq: Once | INTRAVENOUS | Status: DC

## 2016-09-16 MED ORDER — PROMETHAZINE HCL 25 MG/ML IJ SOLN: 6.2500 mg | INTRAMUSCULAR | Status: DC | PRN

## 2016-09-16 MED ORDER — ONDANSETRON HCL 4 MG/2ML IJ SOLN
4.0000 mg | Freq: Four times a day (QID) | INTRAMUSCULAR | Status: DC | PRN
  Administered 2016-09-17: 4 mg via INTRAVENOUS
  Filled 2016-09-16: qty 2

## 2016-09-16 SURGICAL SUPPLY — 54 items
BAG DECANTER FOR FLEXI CONT (MISCELLANEOUS) ×2 IMPLANT
BAG SPEC THK2 15X12 ZIP CLS (MISCELLANEOUS) ×1
BAG ZIPLOCK 12X15 (MISCELLANEOUS) ×2 IMPLANT
BANDAGE ACE 6X5 VEL STRL LF (GAUZE/BANDAGES/DRESSINGS) ×2 IMPLANT
BANDAGE ELASTIC 6 VELCRO ST LF (GAUZE/BANDAGES/DRESSINGS) ×1 IMPLANT
BLADE SAG 18X100X1.27 (BLADE) ×2 IMPLANT
BLADE SAW SGTL 11.0X1.19X90.0M (BLADE) ×2 IMPLANT
BOWL SMART MIX CTS (DISPOSABLE) ×2 IMPLANT
CAP KNEE TOTAL 3 SIGMA ×1 IMPLANT
CEMENT HV SMART SET (Cement) ×4 IMPLANT
CLOTH BEACON ORANGE TIMEOUT ST (SAFETY) ×2 IMPLANT
CUFF TOURN SGL QUICK 34 (TOURNIQUET CUFF) ×2
CUFF TRNQT CYL 34X4X40X1 (TOURNIQUET CUFF) ×1 IMPLANT
DECANTER SPIKE VIAL GLASS SM (MISCELLANEOUS) ×2 IMPLANT
DRAPE U-SHAPE 47X51 STRL (DRAPES) ×2 IMPLANT
DRSG ADAPTIC 3X8 NADH LF (GAUZE/BANDAGES/DRESSINGS) ×2 IMPLANT
DRSG PAD ABDOMINAL 8X10 ST (GAUZE/BANDAGES/DRESSINGS) ×2 IMPLANT
DURAPREP 26ML APPLICATOR (WOUND CARE) ×2 IMPLANT
ELECT REM PT RETURN 9FT ADLT (ELECTROSURGICAL) ×2
ELECTRODE REM PT RTRN 9FT ADLT (ELECTROSURGICAL) ×1 IMPLANT
EVACUATOR 1/8 PVC DRAIN (DRAIN) ×2 IMPLANT
GAUZE SPONGE 4X4 12PLY STRL (GAUZE/BANDAGES/DRESSINGS) ×2 IMPLANT
GLOVE BIO SURGEON STRL SZ7.5 (GLOVE) IMPLANT
GLOVE BIO SURGEON STRL SZ8 (GLOVE) ×2 IMPLANT
GLOVE BIOGEL PI IND STRL 6.5 (GLOVE) IMPLANT
GLOVE BIOGEL PI IND STRL 8 (GLOVE) ×1 IMPLANT
GLOVE BIOGEL PI INDICATOR 6.5 (GLOVE)
GLOVE BIOGEL PI INDICATOR 8 (GLOVE) ×1
GLOVE SURG SS PI 6.5 STRL IVOR (GLOVE) IMPLANT
GOWN STRL REUS W/TWL LRG LVL3 (GOWN DISPOSABLE) ×2 IMPLANT
GOWN STRL REUS W/TWL XL LVL3 (GOWN DISPOSABLE) IMPLANT
HANDPIECE INTERPULSE COAX TIP (DISPOSABLE) ×2
IMMOBILIZER KNEE 20 (SOFTGOODS) ×2
IMMOBILIZER KNEE 20 THIGH 36 (SOFTGOODS) ×1 IMPLANT
MANIFOLD NEPTUNE II (INSTRUMENTS) ×2 IMPLANT
NS IRRIG 1000ML POUR BTL (IV SOLUTION) ×2 IMPLANT
PACK TOTAL KNEE CUSTOM (KITS) ×2 IMPLANT
PAD ABD 8X10 STRL (GAUZE/BANDAGES/DRESSINGS) ×1 IMPLANT
PADDING CAST COTTON 6X4 STRL (CAST SUPPLIES) ×4 IMPLANT
POSITIONER SURGICAL ARM (MISCELLANEOUS) ×2 IMPLANT
SET HNDPC FAN SPRY TIP SCT (DISPOSABLE) ×1 IMPLANT
STRIP CLOSURE SKIN 1/2X4 (GAUZE/BANDAGES/DRESSINGS) ×3 IMPLANT
SUT MNCRL AB 4-0 PS2 18 (SUTURE) ×2 IMPLANT
SUT STRATAFIX 0 PDS 27 VIOLET (SUTURE) ×2
SUT VIC AB 2-0 CT1 27 (SUTURE) ×6
SUT VIC AB 2-0 CT1 TAPERPNT 27 (SUTURE) ×3 IMPLANT
SUT VLOC 180 0 24IN GS25 (SUTURE) IMPLANT
SUTURE STRATFX 0 PDS 27 VIOLET (SUTURE) ×1 IMPLANT
SYR 50ML LL SCALE MARK (SYRINGE) ×2 IMPLANT
TRAY FOLEY W/METER SILVER 14FR (SET/KITS/TRAYS/PACK) ×1 IMPLANT
TRAY FOLEY W/METER SILVER 16FR (SET/KITS/TRAYS/PACK) ×1 IMPLANT
WATER STERILE IRR 1000ML POUR (IV SOLUTION) ×4 IMPLANT
WRAP KNEE MAXI GEL POST OP (GAUZE/BANDAGES/DRESSINGS) ×2 IMPLANT
YANKAUER SUCT BULB TIP 10FT TU (MISCELLANEOUS) ×2 IMPLANT

## 2016-09-16 NOTE — Anesthesia Postprocedure Evaluation (Signed)
Anesthesia Post Note  Patient: Cheryl Martin  Procedure(s) Performed: Procedure(s) (LRB): LEFT TOTAL KNEE ARTHROPLASTY (Left)  Patient location during evaluation: PACU Anesthesia Type: Spinal Level of consciousness: oriented and awake and alert Pain management: pain level controlled Vital Signs Assessment: post-procedure vital signs reviewed and stable Respiratory status: spontaneous breathing, respiratory function stable and patient connected to nasal cannula oxygen Cardiovascular status: blood pressure returned to baseline and stable Postop Assessment: no headache and no backache Anesthetic complications: no       Last Vitals:  Vitals:   09/16/16 1030 09/16/16 1031  BP:  (!) 112/54  Pulse: 68 63  Resp: 14 14  Temp:      Last Pain:  Vitals:   09/16/16 1031  TempSrc:   PainSc: 0-No pain                 Llewelyn Sheaffer S

## 2016-09-16 NOTE — Interval H&P Note (Signed)
History and Physical Interval Note:  09/16/2016 6:53 AM  Cheryl Martin  has presented today for surgery, with the diagnosis of OA Left knee   The various methods of treatment have been discussed with the patient and family. After consideration of risks, benefits and other options for treatment, the patient has consented to  Procedure(s): LEFT TOTAL KNEE ARTHROPLASTY (Left) as a surgical intervention .  The patient's history has been reviewed, patient examined, no change in status, stable for surgery.  I have reviewed the patient's chart and labs.  Questions were answered to the patient's satisfaction.     [name]

## 2016-09-16 NOTE — Anesthesia Preprocedure Evaluation (Addendum)
Anesthesia Evaluation  Patient identified by MRN, date of birth, ID band Patient awake    Reviewed: Allergy & Precautions, NPO status , Patient's Chart, lab work & pertinent test results  Airway Mallampati: II  TM Distance: >3 FB Neck ROM: Full    Dental no notable dental hx.    Pulmonary neg pulmonary ROS, former smoker,    Pulmonary exam normal breath sounds clear to auscultation       Cardiovascular negative cardio ROS Normal cardiovascular exam Rhythm:Regular Rate:Normal     Neuro/Psych negative neurological ROS  negative psych ROS   GI/Hepatic negative GI ROS, Neg liver ROS,   Endo/Other  negative endocrine ROS  Renal/GU negative Renal ROS  negative genitourinary   Musculoskeletal negative musculoskeletal ROS (+)   Abdominal   Peds negative pediatric ROS (+)  Hematology negative hematology ROS (+)   Anesthesia Other Findings   Reproductive/Obstetrics negative OB ROS                             Anesthesia Physical Anesthesia Plan  ASA: II  Anesthesia Plan: Spinal   Post-op Pain Management:    Induction: Intravenous  Airway Management Planned: Nasal Cannula  Additional Equipment:   Intra-op Plan:   Post-operative Plan:   Informed Consent: I have reviewed the patients History and Physical, chart, labs and discussed the procedure including the risks, benefits and alternatives for the proposed anesthesia with the patient or authorized representative who has indicated his/her understanding and acceptance.   Dental advisory given  Plan Discussed with: CRNA and Surgeon  Anesthesia Plan Comments:         Anesthesia Quick Evaluation

## 2016-09-16 NOTE — Anesthesia Procedure Notes (Signed)
Anesthesia Regional Block: Adductor canal block   Pre-Anesthetic Checklist: ,, timeout performed, Correct Patient, Correct Site, Correct Laterality, Correct Procedure, Correct Position, site marked, Risks and benefits discussed,  Surgical consent,  Pre-op evaluation,  At surgeon's request and post-op pain management  Laterality: Left  Prep: chloraprep       Needles:  Injection technique: Single-shot  Needle Type: Echogenic Needle     Needle Length: 9cm      Additional Needles:   Procedures: ultrasound guided,,,,,,,,  Narrative:  Start time: 09/16/2016 8:35 AM End time: 09/16/2016 8:46 AM Injection made incrementally with aspirations every 5 mL.  Performed by: Personally  Anesthesiologist: Chayce Rullo  Additional Notes: Patient tolerated the procedure well without complications

## 2016-09-16 NOTE — Evaluation (Signed)
Physical Therapy Evaluation Patient Details Name: Cheryl Martin MRN: [id number] DOB: 09-29-1930 Today's Date: 09/16/2016   History of Present Illness  Pt s/p L TKR  and with hx of ADD and hearing deficits  Clinical Impression  Pt s/p L TKR and presents with decreased L LE strength/ROM and post op pain limiting functional mobility.  Pt should progress to dc home with assist of family/friends and virtual HHPT.    Follow Up Recommendations Home health PT (Pt reports having virtual PT at home)    Equipment Recommendations  None recommended by PT    Recommendations for Other Services OT consult     Precautions / Restrictions Precautions Precautions: Fall;Knee Required Braces or Orthoses: Knee Immobilizer - Left Knee Immobilizer - Left: Discontinue once straight leg raise with < 10 degree lag Restrictions Weight Bearing Restrictions: No Other Position/Activity Restrictions: WBAT      Mobility  Bed Mobility Overal bed mobility: Needs Assistance Bed Mobility: Supine to Sit     Supine to sit: Min assist     General bed mobility comments: cues for sequence and use of R LE to self assist  Transfers Overall transfer level: Needs assistance Equipment used: Rolling walker (2 wheeled) Transfers: Sit to/from Stand Sit to Stand: Min assist         General transfer comment: cues for LE management and use of UEs to self assist  Ambulation/Gait Ambulation/Gait assistance: Min assist Ambulation Distance (Feet): 18 Feet Assistive device: Rolling walker (2 wheeled) Gait Pattern/deviations: Step-to pattern;Decreased step length - right;Decreased step length - left;Shuffle;Trunk flexed Gait velocity: decr Gait velocity interpretation: Below normal speed for age/gender General Gait Details: cues for sequence, posture and position from [employer]            Wheelchair Mobility    Modified Rankin (Stroke Patients Only)       Balance                                              Pertinent Vitals/Pain Pain Assessment: 0-10 Pain Score: 8  Pain Location: L knee Pain Descriptors / Indicators: Aching;Sore Pain Intervention(s): Limited activity within patient's tolerance;Monitored during session;Premedicated before session;Patient requesting pain meds-RN notified;Ice applied    Home Living Family/patient expects to be discharged to:: Private residence Living Arrangements: Alone Available Help at Discharge: Family;Friend(s);Available 24 hours/day Type of Home: House Home Access: Stairs to enter Entrance Stairs-Rails: Right Entrance Stairs-Number of Steps: 3 Home Layout: One level Home Equipment: Walker - 2 wheels      Prior Function Level of Independence: Independent               Hand Dominance        Extremity/Trunk Assessment   Upper Extremity Assessment Upper Extremity Assessment: Overall WFL for tasks assessed    Lower Extremity Assessment Lower Extremity Assessment: LLE deficits/detail    Cervical / Trunk Assessment Cervical / Trunk Assessment: Normal  Communication   Communication: No difficulties  Cognition Arousal/Alertness: Awake/alert Behavior During Therapy: WFL for tasks assessed/performed Overall Cognitive Status: Within Functional Limits for tasks assessed                      General Comments      Exercises Total Joint Exercises Ankle Circles/Pumps: AROM;Both;15 reps;Supine   Assessment/Plan    PT Assessment Patient needs continued PT services  PT  Problem List Decreased strength;Decreased range of motion;Decreased activity tolerance;Decreased mobility;Decreased knowledge of use of DME;Pain       PT Treatment Interventions DME instruction;Gait training;Stair training;Functional mobility training;Therapeutic activities;Therapeutic exercise;Patient/family education    PT Goals (Current goals can be found in the Care Plan section)  Acute Rehab PT Goals Patient Stated Goal:  Regain IND  PT Goal Formulation: With patient Time For Goal Achievement: 09/21/16 Potential to Achieve Goals: Good    Frequency 7X/week   Barriers to discharge        Co-evaluation               End of Session Equipment Utilized During Treatment: Gait belt;Left knee immobilizer Activity Tolerance: Patient tolerated treatment well;Patient limited by pain Patient left: in chair;with call bell/phone within reach;with family/visitor present;with chair alarm set Nurse Communication: Mobility status PT Visit Diagnosis: Difficulty in walking, not elsewhere classified (R26.2)         Time: [id number] PT Time Calculation (min) (ACUTE ONLY): 30 min   Charges:   PT Evaluation $PT Eval Low Complexity: 1 Procedure PT Treatments $Gait Training: 8-22 mins   PT G Codes:         Alexius Hangartner 09/16/2016, 4:49 PM

## 2016-09-16 NOTE — Transfer of Care (Signed)
Immediate Anesthesia Transfer of Care Note  Patient: Cheryl Martin  Procedure(s) Performed: Procedure(s): LEFT TOTAL KNEE ARTHROPLASTY (Left)  Patient Location: PACU  Anesthesia Type:Spinal  Level of Consciousness: awake, alert , oriented and patient cooperative  Airway & Oxygen Therapy: Patient Spontanous Breathing and Patient connected to face mask oxygen  Post-op Assessment: Report given to RN and Post -op Vital signs reviewed and stable  Post vital signs: Reviewed and stable  Last Vitals:  Vitals:   09/16/16 0844 09/16/16 0845  BP:    Pulse: (!) 58 64  Resp: 14 11  Temp:      Last Pain:  Vitals:   09/16/16 0635  TempSrc: Oral         Complications: No apparent anesthesia complications

## 2016-09-16 NOTE — H&P (View-Only) (Signed)
[name] DOB: [birth year] Widowed / Language: Cleophus Molt / Race: White Female Date of Admission:  09/16/2016 CC:  Left Knee Pain History of Present Illness The patient is a 83 year old female who comes in for a preoperative History and Physical. The patient is scheduled for a left total knee arthroplasty to be performed by Dr. Dione Plover. [name], MD at [facility] on 09-16-2016. The patient is a 83 year old female who presented with knee complaints. The patient reports left knee symptoms including: pain (constant), giving way, weakness, stiffness, soreness, grinding and cramping which began 6 month(s) ago without any known injury. The patient describes their pain as aching.The patient feels that the symptoms are worsening. The patient has the current diagnosis of knee osteoarthritis. Previous work-up for this problem has included knee x-rays. Past treatment for this problem has included intra-articular injection of corticosteroids (cortisone injection Aug. 2017 relief for 2 days) and physical therapy (2 days worth of PT GOC w/ Selinda Eon: did help). Symptoms are reported to be located in the left knee and include knee pain and decreased range of motion. Current treatment includes application of heat and application of ice. Ms. Ducre had problems with her knee for quite a while now. It has gotten progressively worse in the past six months to year. Initially, she would have just activity related pain, but now she has got pain in activity and at rest. She has got a progressive valgus deformity and now the knee is starting to give out on her at times. She said the knee has essentially taken over her life and she is at a stage where she needs to do something about it. She is ready to proceed with surgery. They have been treated conservatively in the past for the above stated problem and despite conservative measures, they continue to have progressive pain and severe functional limitations and  dysfunction. They have failed non-operative management including home exercise, medications, and injections. It is felt that they would benefit from undergoing total joint replacement. Risks and benefits of the procedure have been discussed with the patient and they elect to proceed with surgery. There are no active contraindications to surgery such as ongoing infection or rapidly progressive neurological disease.  Problem List/Past Medical Chronic Pain  Hypothyroidism  Ulcer disease  Primary osteoarthritis of left knee (M17.12)  Tinnitus  Bronchitis  Pneumonia  Hypercholesterolemia  Hiatal Hernia  Gastric Ulcer  Hemorrhoids  Urinary Incontinence  Urinary Tract Infection  Degenerative Disc Disease  Measles  Mumps  Menopause  No Known Drug Allergies  Adhesive Tape  She is able to use Paper Tape.  Family History  Cancer  Brother. child Cerebrovascular Accident  Maternal Grandmother. First Degree Relatives  reported Heart Disease  Father.  Social History  Children  5 or more Current work status  retired Furniture conservator/restorer weekly; does other Former drinker  06/20/2016: In the past drank wine Living situation  live with caregiver -Daughter, Arbie Cookey Marital status  widowed No history of drug/alcohol rehab  Not under pain contract  Number of flights of stairs before winded  2-3 Tobacco / smoke exposure  06/20/2016: no Tobacco use  Former smoker. 06/20/2016: smoke(d) less than 1/2 pack(s) per day  Medication History  Oxybutynin Chloride (5MG  Tablet, Oral) Active. Vitamin D3 (1000UNIT Tablet, Oral) Active. Vitamin C (100MG  Tablet, Oral) Active. Vitamin B-12 (1000MCG Tablet, Oral) Active. Multivitamin Active. Omega Q Plus Active. Resveratrol Active. Vitamin B6 Active. Folic Acid Active. I-Cap Active. Beet Root Active.  Turmeric Active. Chloroxygen Active. Palmitoleic Acid Active. Stool Softner Active. Myrbetrig  Active. Miralax Active.   Past Surgical History Appendectomy  Cataract Surgery  bilateral Dilation and Curettage of Uterus  Gallbladder Surgery  Date: 1988. laporoscopic Hysterectomy  Date: 62. partial (non-cancerous) Tonsillectomy  Bladder Tack Procedure  Date: 1995. Hand Surgery  Date: 57.   Review of Systems  General Not Present- Chills, Fatigue, Fever, Memory Loss, Night Sweats, Weight Gain and Weight Loss. Skin Not Present- Eczema, Hives, Itching, Lesions and Rash. HEENT Present- Tinnitus. Not Present- Dentures, Double Vision, Headache, Hearing Loss and Visual Loss. Respiratory Not Present- Allergies, Chronic Cough, Coughing up blood, Shortness of breath at rest and Shortness of breath with exertion. Cardiovascular Present- Swelling. Not Present- Chest Pain, Difficulty Breathing Lying Down, Murmur, Palpitations and Racing/skipping heartbeats. Gastrointestinal Present- Constipation. Not Present- Abdominal Pain, Bloody Stool, Diarrhea, Difficulty Swallowing, Heartburn, Jaundice, Loss of appetitie, Nausea and Vomiting. Female Genitourinary Present- Urinary frequency and Urinating at Night. Not Present- Blood in Urine, Discharge, Flank Pain, Incontinence, Painful Urination, Urgency, Urinary Retention and Weak urinary stream. Musculoskeletal Present- Back Pain, Joint Pain and Joint Swelling. Not Present- Morning Stiffness, Muscle Pain, Muscle Weakness and Spasms. Neurological Not Present- Blackout spells, Difficulty with balance, Dizziness, Paralysis, Tremor and Weakness. Psychiatric Not Present- Insomnia.  Vitals  Weight: 145 lb Height: 63.75in Weight was reported by patient. Height was reported by patient. Body Surface Area: 1.7 m Body Mass Index: 25.08 kg/m  Pulse: 68 (Regular)  BP: 126/62 (Sitting, Right Arm, Standard)   Physical Exam  General Mental Status -Alert, cooperative and good historian. General Appearance-pleasant, Not in acute  distress. Orientation-Oriented X3. Build & Nutrition-Well nourished and Well developed.  Head and Neck Head-normocephalic, atraumatic . Neck Global Assessment - supple, no bruit auscultated on the right, no bruit auscultated on the left.  Eye Vision-Wears corrective lenses. Pupil - Bilateral-Regular and Round. Motion - Bilateral-EOMI.  ENMT Note: upper and lower denture plates   Chest and Lung Exam Auscultation Breath sounds - clear at anterior chest wall and clear at posterior chest wall. Adventitious sounds - No Adventitious sounds.  Cardiovascular Auscultation Rhythm - Regular rate and rhythm. Heart Sounds - S1 WNL and S2 WNL. Murmurs & Other Heart Sounds - Auscultation of the heart reveals - No Murmurs.  Abdomen Palpation/Percussion Tenderness - Abdomen is non-tender to palpation. Rigidity (guarding) - Abdomen is soft. Auscultation Auscultation of the abdomen reveals - Bowel sounds normal.  Female Genitourinary Note: Not done, not pertinent to present illness   Musculoskeletal Note: On exam, she is alert and oriented, in no apparent distress. Evaluation of her hip show normal range of motion with no discomfort. Evaluation of her right knee shows no effusion. Her range of motion is 0 to 130. She has slight crepitus on range of motion. No tenderness or instability. Left knee valgus deformity. Range about 5 to 125. Marked crepitus on range of motion with tenderness lateral greater than medial, no instability noted. Pulse, sensation, motor intact both lower extremities.  RADIOGRAPHS AP and lateral of both knees show that she has got bone-on-bone arthritis, lateral and patellofemoral compartments of the left knee with valgus deformity of about 15 degrees. Right knee shows some slight degenerative change.   Assessment & Plan Primary osteoarthritis of left knee (M17.12)  Note:Surgical Plans: Left Total Knee Replacement  Disposition: Home with Daughter,  [facility]-Home Therapy  PCP: Dr. Raliegh Scarlet - Patient has been seen preoperatively and felt to be stable for surgery. "Drink adequate water daily 5  bottles QD, Increase CV fitness to goal of 30 daily, work slowly up to this, tell anesthesiologist about your delayed awakening so they can use short acting flouranes for their gen. anesthesia."  IV TXA  Anesthesia Issues: None excpet slow to wake up and sensitive  Patient was instructed on what medications to stop prior to surgery.  Signed electronically by Ok Edwards, III PA-C

## 2016-09-16 NOTE — Op Note (Signed)
OPERATIVE REPORT-TOTAL KNEE ARTHROPLASTY   Pre-operative diagnosis- Osteoarthritis  Left knee(s)  Post-operative diagnosis- Osteoarthritis Left knee(s)  Procedure-  Left  Total Knee Arthroplasty  Surgeon- Cheryl Plover. Reeder Brisby, MD  Assistant- Ardeen Jourdain, PA-C   Anesthesia-  Adductor canal block and spinal  EBL-* No blood loss amount entered *   Drains Hemovac  Tourniquet time-  Total Tourniquet Time Documented: Thigh (Right) - 29 minutes Total: Thigh (Right) - 29 minutes     Complications- None  Condition-PACU - hemodynamically stable.   Brief Clinical Note  Cheryl Martin is a 83 y.o. year old female with end stage OA of her left knee with progressively worsening pain and dysfunction. She has constant pain, with activity and at rest and significant functional deficits with difficulties even with ADLs. She has had extensive non-op management including analgesics, injections of cortisone and viscosupplements, and home exercise program, but remains in significant pain with significant dysfunction. Radiographs show bone on bone arthritis lateral and patellofemoral with valgus deformity. She presents now for left Total Knee Arthroplasty.    Procedure in detail---   The patient is brought into the operating room and positioned supine on the operating table. After successful administration of  Adductor canal block and spinal,   a tourniquet is placed high on the  Left thigh(s) and the lower extremity is prepped and draped in the usual sterile fashion. Time out is performed by the operating team and then the  Left lower extremity is wrapped in Esmarch, knee flexed and the tourniquet inflated to 300 mmHg.       A midline incision is made with a ten blade through the subcutaneous tissue to the level of the extensor mechanism. A fresh blade is used to make a medial parapatellar arthrotomy. Soft tissue over the proximal medial tibia is subperiosteally elevated to the joint line with a  knife and into the semimembranosus bursa with a Cobb elevator. Soft tissue over the proximal lateral tibia is elevated with attention being paid to avoiding the patellar tendon on the tibial tubercle. The patella is everted, knee flexed 90 degrees and the ACL and PCL are removed. Findings are bone on bone lateral and patellofemoral with large lateral and patellar osteophytes.        The drill is used to create a starting hole in the distal femur and the canal is thoroughly irrigated with sterile saline to remove the fatty contents. The 5 degree Left  valgus alignment guide is placed into the femoral canal and the distal femoral cutting block is pinned to remove 10 mm off the distal femur. Resection is made with an oscillating saw.      The tibia is subluxed forward and the menisci are removed. The extramedullary alignment guide is placed referencing proximally at the medial aspect of the tibial tubercle and distally along the second metatarsal axis and tibial crest. The block is pinned to remove 80mm off the more deficient lateral  side. Resection is made with an oscillating saw. Size 2.5is the most appropriate size for the tibia and the proximal tibia is prepared with the modular drill and keel punch for that size.      The femoral sizing guide is placed and size 3 is most appropriate. Rotation is marked off the epicondylar axis and confirmed by creating a rectangular flexion gap at 90 degrees. The size 3 cutting block is pinned in this rotation and the anterior, posterior and chamfer cuts are made with the oscillating saw. The intercondylar  block is then placed and that cut is made.      Trial size 2.5 tibial component, trial size 3 posterior stabilized femur and a 12.5  mm posterior stabilized rotating platform insert trial is placed. Full extension is achieved with excellent varus/valgus and anterior/posterior balance throughout full range of motion. The patella is everted and thickness measured to be 22  mm.  Free hand resection is taken to 12 mm, a 35 template is placed, lug holes are drilled, trial patella is placed, and it tracks normally. Osteophytes are removed off the posterior femur with the trial in place. All trials are removed and the cut bone surfaces prepared with pulsatile lavage. Cement is mixed and once ready for implantation, the size 2.5 tibial implant, size  3 posterior stabilized femoral component, and the size 35 patella are cemented in place and the patella is held with the clamp. The trial insert is placed and the knee held in full extension. The Exparel (20 ml mixed with 30 ml saline) and .25% Bupivicaine, are injected into the extensor mechanism, posterior capsule, medial and lateral gutters and subcutaneous tissues.  All extruded cement is removed and once the cement is hard the permanent 12.5 mm posterior stabilized rotating platform insert is placed into the tibial tray.      The wound is copiously irrigated with saline solution and the extensor mechanism closed over a hemovac drain with #1 V-loc suture. The tourniquet is released for a total tourniquet time of 29  minutes. Flexion against gravity is 140 degrees and the patella tracks normally. Subcutaneous tissue is closed with 2.0 vicryl and subcuticular with running 4.0 Monocryl. The incision is cleaned and dried and steri-strips and a bulky sterile dressing are applied. The limb is placed into a knee immobilizer and the patient is awakened and transported to recovery in stable condition.      Please note that a surgical assistant was a medical necessity for this procedure in order to perform it in a safe and expeditious manner. Surgical assistant was necessary to retract the ligaments and vital neurovascular structures to prevent injury to them and also necessary for proper positioning of the limb to allow for anatomic placement of the prosthesis.   Cheryl Plover Shannen Flansburg, MD    09/16/2016, 10:17 AM

## 2016-09-16 NOTE — Anesthesia Procedure Notes (Signed)
Spinal  Patient location during procedure: OR Staffing Anesthesiologist: Casee Knepp Performed: anesthesiologist  Preanesthetic Checklist Completed: patient identified, site marked, surgical consent, pre-op evaluation, timeout performed, IV checked, risks and benefits discussed and monitors and equipment checked Spinal Block Patient position: sitting Prep: Betadine Patient monitoring: heart rate, continuous pulse ox and blood pressure Injection technique: single-shot Needle Needle type: Sprotte  Needle gauge: 24 G Needle length: 9 cm Additional Notes Expiration date of kit checked and confirmed. Patient tolerated procedure well, without complications.       

## 2016-09-16 NOTE — Progress Notes (Signed)
Assisted Dr. Rose with left, ultrasound guided, adductor canal block. Side rails up, monitors on throughout procedure. See vital signs in flow sheet. Tolerated Procedure well.  

## 2016-09-17 LAB — BASIC METABOLIC PANEL
ANION GAP: 5 (ref 5–15)
BUN: 13 mg/dL (ref 6–20)
CO2: 26 mmol/L (ref 22–32)
Calcium: 8.8 mg/dL — ABNORMAL LOW (ref 8.9–10.3)
Chloride: 108 mmol/L (ref 101–111)
Creatinine, Ser: 0.82 mg/dL (ref 0.44–1.00)
GLUCOSE: 113 mg/dL — AB (ref 65–99)
POTASSIUM: 3.9 mmol/L (ref 3.5–5.1)
SODIUM: 139 mmol/L (ref 135–145)

## 2016-09-17 LAB — CBC
HCT: 31.5 % — ABNORMAL LOW (ref 36.0–46.0)
Hemoglobin: 10.9 g/dL — ABNORMAL LOW (ref 12.0–15.0)
MCH: 31.1 pg (ref 26.0–34.0)
MCHC: 34.6 g/dL (ref 30.0–36.0)
MCV: 89.7 fL (ref 78.0–100.0)
PLATELETS: 149 10*3/uL — AB (ref 150–400)
RBC: 3.51 MIL/uL — AB (ref 3.87–5.11)
RDW: 12.6 % (ref 11.5–15.5)
WBC: 9.2 10*3/uL (ref 4.0–10.5)

## 2016-09-17 MED ORDER — OXYCODONE HCL 5 MG PO TABS: 5.0000 mg | ORAL_TABLET | ORAL | 0 refills | Status: DC | PRN

## 2016-09-17 MED ORDER — TRAMADOL HCL 50 MG PO TABS: 50.0000 mg | ORAL_TABLET | Freq: Four times a day (QID) | ORAL | 0 refills | Status: DC | PRN

## 2016-09-17 MED ORDER — RIVAROXABAN 10 MG PO TABS: 10.0000 mg | ORAL_TABLET | Freq: Every day | ORAL | 0 refills | Status: DC

## 2016-09-17 MED ORDER — METHOCARBAMOL 500 MG PO TABS: 500.0000 mg | ORAL_TABLET | Freq: Four times a day (QID) | ORAL | 0 refills | Status: DC | PRN

## 2016-09-17 NOTE — Progress Notes (Signed)
Physical Therapy Treatment Patient Details Name: Cheryl Martin MRN: [id number] DOB: 1930/12/09 Today's Date: 09/17/2016    History of Present Illness Pt s/p L TKR  and with hx of ADD and hearing deficits    PT Comments    POD # 1 am session  Applied KI and assisted OOB to amb a limited distance then performed some TE's due to increased pain level.     Follow Up Recommendations  Other (comment) (virtual)     Equipment Recommendations  None recommended by PT    Recommendations for Other Services       Precautions / Restrictions Precautions Precautions: Fall;Knee Precaution Comments: instructed on KI use for amb and proper application Required Braces or Orthoses: Knee Immobilizer - Left Knee Immobilizer - Left: Discontinue once straight leg raise with < 10 degree lag Restrictions Weight Bearing Restrictions: No Other Position/Activity Restrictions: WBAT    Mobility  Bed Mobility Overal bed mobility: Needs Assistance Bed Mobility: Supine to Sit     Supine to sit: Min assist;Min guard     General bed mobility comments: OOB in recliner  Transfers Overall transfer level: Needs assistance Equipment used: Rolling walker (2 wheeled) Transfers: Sit to/from Stand Sit to Stand: Min assist;Min guard         General transfer comment: 25% VC for hand placement  Ambulation/Gait Ambulation/Gait assistance: Min assist Ambulation Distance (Feet): 20 Feet Assistive device: Rolling walker (2 wheeled) Gait Pattern/deviations: Step-to pattern;Decreased step length - right;Decreased step length - left;Shuffle;Trunk flexed Gait velocity: decreased   General Gait Details: 25% cues for sequence, posture and position from [employer]            Wheelchair Mobility    Modified Rankin (Stroke Patients Only)       Balance                                    Cognition Arousal/Alertness: Awake/alert Behavior During Therapy: WFL for tasks  assessed/performed Overall Cognitive Status: Within Functional Limits for tasks assessed                      Exercises   Total Knee Replacement TE's 10 reps B LE ankle pumps 10 reps towel squeezes 10 reps knee presses  5 reps heel slides   5 reps SAQ's 10 reps SLR's 10 reps ABD Followed by ICE    General Comments        Pertinent Vitals/Pain Pain Assessment: 0-10 Pain Score: 4  Pain Location: L knee with TE's Pain Descriptors / Indicators: Aching;Sore;Operative site guarding Pain Intervention(s): Monitored during session;Repositioned;Ice applied    Home Living Family/patient expects to be discharged to:: Private residence Living Arrangements: Alone Available Help at Discharge: Family;Friend(s);Available 24 hours/day Type of Home: House Home Access: Stairs to enter Entrance Stairs-Rails: Right Home Layout: One level Home Equipment: Environmental consultant - 2 wheels      Prior Function Level of Independence: Independent          PT Goals (current goals can now be found in the care plan section) Acute Rehab PT Goals Patient Stated Goal: Regain IND  Progress towards PT goals: Progressing toward goals    Frequency    7X/week      PT Plan Current plan remains appropriate    Co-evaluation             End of Session Equipment Utilized During Treatment:  Gait belt;Left knee immobilizer Activity Tolerance: Patient tolerated treatment well;Patient limited by pain Patient left: in chair;with call bell/phone within reach;with family/visitor present;with chair alarm set Nurse Communication: Mobility status PT Visit Diagnosis: Difficulty in walking, not elsewhere classified (R26.2)     Time: [id number] PT Time Calculation (min) (ACUTE ONLY): 21 min  Charges:  $Gait Training: 8-22 mins                    G Codes:       Rica Koyanagi  PTA [facility]  Acute  Rehab Pager      [phone]

## 2016-09-17 NOTE — Progress Notes (Signed)
   Patient Details Name: Cheryl Martin MRN: [id number] DOB: [birth year]    pm session pt just got back to bed from bathroom and is getting into the CPM.  Moving well with staff.  Will see in am prior to D/C.    Rica Koyanagi  PTA [facility]  Acute  Rehab Pager      [phone]

## 2016-09-17 NOTE — Discharge Summary (Signed)
Physician Discharge Summary   Patient ID: Cheryl Martin MRN: [id number] DOB/AGE: [birth year] 83 y.o.  Admit date: 09/16/2016 Discharge date: 09/18/2016  Primary Diagnosis:  Osteoarthritis  Left knee(s) Admission Diagnoses:  Past Medical History:  Diagnosis Date  . ADD (attention deficit disorder)   . Allergy   . Anemia    as a teenager  . Arthritis   . Complication of anesthesia    slow to wake up after colonoscopy  . Edema   . Hearing loss   . History of hiatal hernia    hx of  . Hypothyroidism    hx of as child and during pregnancy  . Neuromuscular disorder ([place])   . Nocturia   . Pneumonia    hx of after bronchitis  . PONV (postoperative nausea and vomiting)   . Thyroid disease    Discharge Diagnoses:   Principal Problem:   OA (osteoarthritis) of knee  Estimated body mass index is 25.92 kg/m as calculated from the following:   Height as of this encounter: 5' 4"  (1.626 m).   Weight as of this encounter: 68.5 kg (151 lb).  Procedure:  Procedure(s) (LRB): LEFT TOTAL KNEE ARTHROPLASTY (Left)   Consults: None  HPI: Martin Cheryl is a 83 y.o. year old female with end stage OA of her left knee with progressively worsening pain and dysfunction. She has constant pain, with activity and at rest and significant functional deficits with difficulties even with ADLs. She has had extensive non-op management including analgesics, injections of cortisone and viscosupplements, and home exercise program, but remains in significant pain with significant dysfunction. Radiographs show bone on bone arthritis lateral and patellofemoral with valgus deformity. She presents now for left Total Knee Arthroplasty.    Laboratory Data: Admission on 09/16/2016  Component Date Value Ref Range Status  . WBC 09/17/2016 9.2  4.0 - 10.5 K/uL Final  . RBC 09/17/2016 3.51* 3.87 - 5.11 MIL/uL Final  . Hemoglobin 09/17/2016 10.9* 12.0 - 15.0 g/dL Final  . HCT 09/17/2016 31.5* 36.0 - 46.0 % Final   . MCV 09/17/2016 89.7  78.0 - 100.0 fL Final  . MCH 09/17/2016 31.1  26.0 - 34.0 pg Final  . MCHC 09/17/2016 34.6  30.0 - 36.0 g/dL Final  . RDW 09/17/2016 12.6  11.5 - 15.5 % Final  . Platelets 09/17/2016 149* 150 - 400 K/uL Final  . Sodium 09/17/2016 139  135 - 145 mmol/L Final  . Potassium 09/17/2016 3.9  3.5 - 5.1 mmol/L Final  . Chloride 09/17/2016 108  101 - 111 mmol/L Final  . CO2 09/17/2016 26  22 - 32 mmol/L Final  . Glucose, Bld 09/17/2016 113* 65 - 99 mg/dL Final  . BUN 09/17/2016 13  6 - 20 mg/dL Final  . Creatinine, Ser 09/17/2016 0.82  0.44 - 1.00 mg/dL Final  . Calcium 09/17/2016 8.8* 8.9 - 10.3 mg/dL Final  . GFR calc non Af Amer 09/17/2016 >60  >60 mL/min Final  . GFR calc Af Amer 09/17/2016 >60  >60 mL/min Final   Comment: (NOTE) The eGFR has been calculated using the CKD EPI equation. This calculation has not been validated in all clinical situations. eGFR's persistently <60 mL/min signify possible Chronic Kidney Disease.   Georgiann Hahn gap 09/17/2016 5  5 - 15 Final  Hospital Outpatient Visit on 09/09/2016  Component Date Value Ref Range Status  . aPTT 09/09/2016 28  24 - 36 seconds Final  . WBC 09/09/2016 5.0  4.0 - 10.5 K/uL Final  .  RBC 09/09/2016 4.25  3.87 - 5.11 MIL/uL Final  . Hemoglobin 09/09/2016 13.2  12.0 - 15.0 g/dL Final  . HCT 09/09/2016 38.3  36.0 - 46.0 % Final  . MCV 09/09/2016 90.1  78.0 - 100.0 fL Final  . MCH 09/09/2016 31.1  26.0 - 34.0 pg Final  . MCHC 09/09/2016 34.5  30.0 - 36.0 g/dL Final  . RDW 09/09/2016 12.5  11.5 - 15.5 % Final  . Platelets 09/09/2016 209  150 - 400 K/uL Final  . Sodium 09/09/2016 140  135 - 145 mmol/L Final  . Potassium 09/09/2016 4.9  3.5 - 5.1 mmol/L Final  . Chloride 09/09/2016 108  101 - 111 mmol/L Final  . CO2 09/09/2016 27  22 - 32 mmol/L Final  . Glucose, Bld 09/09/2016 90  65 - 99 mg/dL Final  . BUN 09/09/2016 24* 6 - 20 mg/dL Final  . Creatinine, Ser 09/09/2016 0.74  0.44 - 1.00 mg/dL Final  . Calcium  09/09/2016 9.6  8.9 - 10.3 mg/dL Final  . Total Protein 09/09/2016 6.7  6.5 - 8.1 g/dL Final  . Albumin 09/09/2016 4.1  3.5 - 5.0 g/dL Final  . AST 09/09/2016 22  15 - 41 U/L Final  . ALT 09/09/2016 17  14 - 54 U/L Final  . Alkaline Phosphatase 09/09/2016 79  38 - 126 U/L Final  . Total Bilirubin 09/09/2016 0.3  0.3 - 1.2 mg/dL Final  . GFR calc non Af Amer 09/09/2016 >60  >60 mL/min Final  . GFR calc Af Amer 09/09/2016 >60  >60 mL/min Final   Comment: (NOTE) The eGFR has been calculated using the CKD EPI equation. This calculation has not been validated in all clinical situations. eGFR's persistently <60 mL/min signify possible Chronic Kidney Disease.   . Anion gap 09/09/2016 5  5 - 15 Final  . Prothrombin Time 09/09/2016 13.4  11.4 - 15.2 seconds Final  . INR 09/09/2016 1.02   Final  . ABO/RH(D) 09/09/2016 A POS   Final  . Antibody Screen 09/09/2016 NEG   Final  . Sample Expiration 09/09/2016 09/19/2016   Final  . Extend sample reason 09/09/2016 NO TRANSFUSIONS OR PREGNANCY IN THE PAST 3 MONTHS   Final  . MRSA, PCR 09/09/2016 NEGATIVE  NEGATIVE Final  . Staphylococcus aureus 09/09/2016 NEGATIVE  NEGATIVE Final   Comment:        The Xpert SA Assay (FDA approved for NASAL specimens in patients over 27 years of age), is one component of a comprehensive surveillance program.  Test performance has been validated by [facility] for patients greater than or equal to 29 year old. It is not intended to diagnose infection nor to guide or monitor treatment.   . ABO/RH(D) 09/09/2016 A POS   Final     X-Rays:No results found.  EKG: Orders placed or performed in visit on 07/19/16  . EKG 12-Lead     Hospital Course: [name] is a 83 y.o. who was admitted to [facility]. They were brought to the operating room on 09/16/2016 and underwent Procedure(s): LEFT TOTAL KNEE ARTHROPLASTY.  Patient tolerated the procedure well and was later transferred to the recovery room  and then to the orthopaedic floor for postoperative care.  They were given PO and IV analgesics for pain control following their surgery.  They were given 24 hours of postoperative antibiotics of  Anti-infectives    Start     Dose/Rate Route Frequency Ordered Stop   09/16/16 1530  ceFAZolin (  ANCEF) IVPB 2g/100 mL premix     2 g 200 mL/hr over 30 Minutes Intravenous Every 6 hours 09/16/16 1159 09/16/16 2107   09/16/16 0618  ceFAZolin (ANCEF) IVPB 2g/100 mL premix     2 g 200 mL/hr over 30 Minutes Intravenous On call to O.R. 09/16/16 4098 09/16/16 0916   09/16/16 0618  ceFAZolin (ANCEF) IVPB 2g/100 mL premix  Status:  Discontinued     2 g 200 mL/hr over 30 Minutes Intravenous On call to O.R. 09/16/16 1191 09/16/16 4782     and started on DVT prophylaxis in the form of Xarelto.   PT and OT were ordered for total joint protocol.  Discharge planning consulted to help with postop disposition and equipment needs.  Patient had a decent night on the evening of surgery.  They started to get up OOB with therapy on day one. Hemovac drain was pulled without difficulty.   POD 2- Continued to work with therapy into day two.  Dressing was changed on day two and the incision was healing well.  Patient was seen in rounds and was ready to go home later that day.  No HHPT - In-Home VERA Therapy Diet: Regular diet Activity:WBAT Follow-up:in 2 weeks Disposition - Home Discharged Condition: good   Discharge Instructions    Call MD / Call 911    Complete by:  As directed    If you experience chest pain or shortness of breath, CALL 911 and be transported to the hospital emergency room.  If you develope a fever above 101 F, pus (white drainage) or increased drainage or redness at the wound, or calf pain, call your surgeon's office.   Change dressing    Complete by:  As directed    Change dressing daily with sterile 4 x 4 inch gauze dressing and apply TED hose. Do not submerge the incision under water.    Constipation Prevention    Complete by:  As directed    Drink plenty of fluids.  Prune juice may be helpful.  You may use a stool softener, such as Colace (over the counter) 100 mg twice a day.  Use MiraLax (over the counter) for constipation as needed.   Diet - low sodium heart healthy    Complete by:  As directed    Discharge instructions    Complete by:  As directed    Pick up stool softner and laxative for home use following surgery while on pain medications. Do not submerge incision under water. Please use good hand washing techniques while changing dressing each day. May shower starting three days after surgery. Please use a clean towel to pat the incision dry following showers. Continue to use ice for pain and swelling after surgery. Do not use any lotions or creams on the incision until instructed by your surgeon.  Wear both TED hose on both legs during the day every day for three weeks, but may have off at night at home.  Postoperative Constipation Protocol  Constipation - defined medically as fewer than three stools per week and severe constipation as less than one stool per week.  One of the most common issues patients have following surgery is constipation.  Even if you have a regular bowel pattern at home, your normal regimen is likely to be disrupted due to multiple reasons following surgery.  Combination of anesthesia, postoperative narcotics, change in appetite and fluid intake all can affect your bowels.  In order to avoid complications following surgery, here are some  recommendations in order to help you during your recovery period.  Colace (docusate) - Pick up an over-the-counter form of Colace or another stool softener and take twice a day as long as you are requiring postoperative pain medications.  Take with a full glass of water daily.  If you experience loose stools or diarrhea, hold the colace until you stool forms back up.  If your symptoms do not get better within 1  week or if they get worse, check with your doctor.  Dulcolax (bisacodyl) - Pick up over-the-counter and take as directed by the product packaging as needed to assist with the movement of your bowels.  Take with a full glass of water.  Use this product as needed if not relieved by Colace only.   MiraLax (polyethylene glycol) - Pick up over-the-counter to have on hand.  MiraLax is a solution that will increase the amount of water in your bowels to assist with bowel movements.  Take as directed and can mix with a glass of water, juice, soda, coffee, or tea.  Take if you go more than two days without a movement. Do not use MiraLax more than once per day. Call your doctor if you are still constipated or irregular after using this medication for 7 days in a row.  If you continue to have problems with postoperative constipation, please contact the office for further assistance and recommendations.  If you experience "the worst abdominal pain ever" or develop nausea or vomiting, please contact the office immediatly for further recommendations for treatment.   Take Xarelto for two and a half more weeks, then discontinue Xarelto. Once the patient has completed the blood thinner regimen, then take a Baby 81 mg Aspirin daily for three more weeks.   Do not put a pillow under the knee. Place it under the heel.    Complete by:  As directed    Do not sit on low chairs, stoools or toilet seats, as it may be difficult to get up from low surfaces    Complete by:  As directed    Driving restrictions    Complete by:  As directed    No driving until released by the physician.   Increase activity slowly as tolerated    Complete by:  As directed    Lifting restrictions    Complete by:  As directed    No lifting until released by the physician.   Patient may shower    Complete by:  As directed    You may shower without a dressing once there is no drainage.  Do not wash over the wound.  If drainage remains, do not  shower until drainage stops.   TED hose    Complete by:  As directed    Use stockings (TED hose) for 3 weeks on both leg(s).  You may remove them at night for sleeping.   Weight bearing as tolerated    Complete by:  As directed    Laterality:  left   Extremity:  Lower     Allergies as of 09/17/2016      Reactions   Adhesive [tape] Other (See Comments)   SKIN TEARS PATIENT TOLERATES PAPER TAPE   Food    Patient is a vegetarian (does eat FISH)      Medication List    STOP taking these medications   AMINO ACID PO   Astaxanthin 4 MG Caps   Carnitine 250 MG Caps   [employer] B-12 1500 MCG Tbdp  Generic drug:  Cyanocobalamin   FISH OIL-KRILL OIL PO   folic acid 979 MCG tablet Commonly known as:  FOLVITE   multivitamin with minerals Tabs tablet   SYSTANE OP   Turmeric 500 MG Caps   vitamin C 1000 MG tablet   Vitamin D3 2000 units Tabs     TAKE these medications   methocarbamol 500 MG tablet Commonly known as:  ROBAXIN Take 1 tablet (500 mg total) by mouth every 6 (six) hours as needed for muscle spasms.   mirabegron ER 50 MG Tb24 tablet Commonly known as:  MYRBETRIQ Take 50 mg by mouth daily.   oxyCODONE 5 MG immediate release tablet Commonly known as:  Oxy IR/ROXICODONE Take 1-2 tablets (5-10 mg total) by mouth every 4 (four) hours as needed for moderate pain or severe pain.   rivaroxaban 10 MG Tabs tablet Commonly known as:  XARELTO Take 1 tablet (10 mg total) by mouth daily with breakfast. Take Xarelto for two and a half more weeks following discharge from the hospital, then discontinue Xarelto. Once the patient has completed the blood thinner regimen, then take a Baby 81 mg Aspirin daily for three more weeks. Start taking on:  09/18/2016   traMADol 50 MG tablet Commonly known as:  ULTRAM Take 1-2 tablets (50-100 mg total) by mouth every 6 (six) hours as needed for moderate pain.      Follow-up Information    [name], MD. Schedule an appointment as  soon as possible for a visit on 10/01/2016.   Specialty:  Orthopedic Surgery Contact information: [address] [place] [address] [phone]           Signed: Arlee Muslim, PA-C Orthopaedic Surgery 09/17/2016, 10:23 PM

## 2016-09-17 NOTE — Discharge Instructions (Addendum)
° °Dr. Frank [name] °Total Joint Specialist °[facility] °[address] °[place], [place] [address] °[phone] ° °TOTAL KNEE REPLACEMENT POSTOPERATIVE DIRECTIONS ° °Knee Rehabilitation, Guidelines Following Surgery  °Results after knee surgery are often greatly improved when you follow the exercise, range of motion and muscle strengthening exercises prescribed by your doctor. Safety measures are also important to protect the knee from further injury. Any time any of these exercises cause you to have increased pain or swelling in your knee joint, decrease the amount until you are comfortable again and slowly increase them. If you have problems or questions, call your caregiver or physical therapist for advice.  ° °HOME CARE INSTRUCTIONS  °Remove items at home which could result in a fall. This includes throw rugs or furniture in walking pathways.  °· ICE to the affected knee every three hours for 30 minutes at a time and then as needed for pain and swelling.  Continue to use ice on the knee for pain and swelling from surgery. You may notice swelling that will progress down to the foot and ankle.  This is normal after surgery.  Elevate the leg when you are not up walking on it.   °· Continue to use the breathing machine which will help keep your temperature down.  It is common for your temperature to cycle up and down following surgery, especially at night when you are not up moving around and exerting yourself.  The breathing machine keeps your lungs expanded and your temperature down. °· Do not place pillow under knee, focus on keeping the knee straight while resting ° °DIET °You may resume your previous home diet once your are discharged from the hospital. ° °DRESSING / WOUND CARE / SHOWERING °You may shower 3 days after surgery, but keep the wounds dry during showering.  You may use an occlusive plastic wrap (Press'n Seal for example), NO SOAKING/SUBMERGING IN THE BATHTUB.  If the  bandage gets wet, change with a clean dry gauze.  If the incision gets wet, pat the wound dry with a clean towel. °You may start showering once you are discharged home but do not submerge the incision under water. Just pat the incision dry and apply a dry gauze dressing on daily. °Change the surgical dressing daily and reapply a dry dressing each time. ° °ACTIVITY °Walk with your walker as instructed. °Use walker as long as suggested by your caregivers. °Avoid periods of inactivity such as sitting longer than an hour when not asleep. This helps prevent blood clots.  °You may resume a sexual relationship in one month or when given the OK by your doctor.  °You may return to work once you are cleared by your doctor.  °Do not drive a car for 6 weeks or until released by you surgeon.  °Do not drive while taking narcotics. ° °WEIGHT BEARING °Weight bearing as tolerated with assist device (walker, cane, etc) as directed, use it as long as suggested by your surgeon or therapist, typically at least 4-6 weeks. ° °POSTOPERATIVE CONSTIPATION PROTOCOL °Constipation - defined medically as fewer than three stools per week and severe constipation as less than one stool per week. ° °One of the most common issues patients have following surgery is constipation.  Even if you have a regular bowel pattern at home, your normal regimen is likely to be disrupted due to multiple reasons following surgery.  Combination of anesthesia, postoperative narcotics, change in appetite and fluid intake all can affect your bowels.    In order to avoid complications following surgery, here are some recommendations in order to help you during your recovery period. ° °Colace (docusate) - Pick up an over-the-counter form of Colace or another stool softener and take twice a day as long as you are requiring postoperative pain medications.  Take with a full glass of water daily.  If you experience loose stools or diarrhea, hold the colace until you stool forms  back up.  If your symptoms do not get better within 1 week or if they get worse, check with your doctor. ° °Dulcolax (bisacodyl) - Pick up over-the-counter and take as directed by the product packaging as needed to assist with the movement of your bowels.  Take with a full glass of water.  Use this product as needed if not relieved by Colace only.  ° °MiraLax (polyethylene glycol) - Pick up over-the-counter to have on hand.  MiraLax is a solution that will increase the amount of water in your bowels to assist with bowel movements.  Take as directed and can mix with a glass of water, juice, soda, coffee, or tea.  Take if you go more than two days without a movement. °Do not use MiraLax more than once per day. Call your doctor if you are still constipated or irregular after using this medication for 7 days in a row. ° °If you continue to have problems with postoperative constipation, please contact the office for further assistance and recommendations.  If you experience "the worst abdominal pain ever" or develop nausea or vomiting, please contact the office immediatly for further recommendations for treatment. ° °ITCHING ° If you experience itching with your medications, try taking only a single pain pill, or even half a pain pill at a time.  You can also use Benadryl over the counter for itching or also to help with sleep.  ° °TED HOSE STOCKINGS °Wear the elastic stockings on both legs for three weeks following surgery during the day but you may remove then at night for sleeping. ° °MEDICATIONS °See your medication summary on the “After Visit Summary” that the nursing staff will review with you prior to discharge.  You may have some home medications which will be placed on hold until you complete the course of blood thinner medication.  It is important for you to complete the blood thinner medication as prescribed by your surgeon.  Continue your approved medications as instructed at time of  discharge. ° °PRECAUTIONS °If you experience chest pain or shortness of breath - call 911 immediately for transfer to the hospital emergency department.  °If you develop a fever greater that 101 F, purulent drainage from wound, increased redness or drainage from wound, foul odor from the wound/dressing, or calf pain - CONTACT YOUR SURGEON.   °                                                °FOLLOW-UP APPOINTMENTS °Make sure you keep all of your appointments after your operation with your surgeon and caregivers. You should call the office at the above phone number and make an appointment for approximately two weeks after the date of your surgery or on the date instructed by your surgeon outlined in the "After Visit Summary". ° ° °RANGE OF MOTION AND STRENGTHENING EXERCISES  °Rehabilitation of the knee is important following a knee injury or   an operation. After just a few days of immobilization, the muscles of the thigh which control the knee become weakened and shrink (atrophy). Knee exercises are designed to build up the tone and strength of the thigh muscles and to improve knee motion. Often times heat used for twenty to thirty minutes before working out will loosen up your tissues and help with improving the range of motion but do not use heat for the first two weeks following surgery. These exercises can be done on a training (exercise) mat, on the floor, on a table or on a bed. Use what ever works the best and is most comfortable for you Knee exercises include:  °Leg Lifts - While your knee is still immobilized in a splint or cast, you can do straight leg raises. Lift the leg to 60 degrees, hold for 3 sec, and slowly lower the leg. Repeat 10-20 times 2-3 times daily. Perform this exercise against resistance later as your knee gets better.  °Quad and Hamstring Sets - Tighten up the muscle on the front of the thigh (Quad) and hold for 5-10 sec. Repeat this 10-20 times hourly. Hamstring sets are done by pushing the  foot backward against an object and holding for 5-10 sec. Repeat as with quad sets.  °· Leg Slides: Lying on your back, slowly slide your foot toward your buttocks, bending your knee up off the floor (only go as far as is comfortable). Then slowly slide your foot back down until your leg is flat on the floor again. °· Angel Wings: Lying on your back spread your legs to the side as far apart as you can without causing discomfort.  °A rehabilitation program following serious knee injuries can speed recovery and prevent re-injury in the future due to weakened muscles. Contact your doctor or a physical therapist for more information on knee rehabilitation.  ° °IF YOU ARE TRANSFERRED TO A SKILLED REHAB FACILITY °If the patient is transferred to a skilled rehab facility following release from the hospital, a list of the current medications will be sent to the facility for the patient to continue.  When discharged from the skilled rehab facility, please have the facility set up the patient's Home Health Physical Therapy prior to being released. Also, the skilled facility will be responsible for providing the patient with their medications at time of release from the facility to include their pain medication, the muscle relaxants, and their blood thinner medication. If the patient is still at the rehab facility at time of the two week follow up appointment, the skilled rehab facility will also need to assist the patient in arranging follow up appointment in our office and any transportation needs. ° °MAKE SURE YOU:  °Understand these instructions.  °Get help right away if you are not doing well or get worse.  ° ° °Pick up stool softner and laxative for home use following surgery while on pain medications. °Do not submerge incision under water. °Please use good hand washing techniques while changing dressing each day. °May shower starting three days after surgery. °Please use a clean towel to pat the incision dry following  showers. °Continue to use ice for pain and swelling after surgery. °Do not use any lotions or creams on the incision until instructed by your surgeon. ° °Take Xarelto for two and a half more weeks following discharge from the hospital, then discontinue Xarelto. °Once the patient has completed the blood thinner regimen, then take a Baby 81 mg Aspirin daily for three   more weeks.    Information on my medicine - XARELTO (Rivaroxaban)  This medication education was reviewed with me or my healthcare representative as part of my discharge preparation.  The pharmacist that spoke with me during my hospital stay was:  Biagio Borg, [facility]  Why was Xarelto prescribed for you? Xarelto was prescribed for you to reduce the risk of blood clots forming after orthopedic surgery. The medical term for these abnormal blood clots is venous thromboembolism (VTE).  What do you need to know about xarelto ? Take your Xarelto ONCE DAILY at the same time every day. You may take it either with or without food.  If you have difficulty swallowing the tablet whole, you may crush it and mix in applesauce just prior to taking your dose.  Take Xarelto exactly as prescribed by your doctor and DO NOT stop taking Xarelto without talking to the doctor who prescribed the medication.  Stopping without other VTE prevention medication to take the place of Xarelto may increase your risk of developing a clot.  After discharge, you should have regular check-up appointments with your healthcare provider that is prescribing your Xarelto.    What do you do if you miss a dose? If you miss a dose, take it as soon as you remember on the same day then continue your regularly scheduled once daily regimen the next day. Do not take two doses of Xarelto on the same day.   Important Safety Information A possible side effect of Xarelto is bleeding. You should call your healthcare provider right away if you experience any of  the following: ? Bleeding from an injury or your nose that does not stop. ? Unusual colored urine (red or dark brown) or unusual colored stools (red or black). ? Unusual bruising for unknown reasons. ? A serious fall or if you hit your head (even if there is no bleeding).  Some medicines may interact with Xarelto and might increase your risk of bleeding while on Xarelto. To help avoid this, consult your healthcare provider or pharmacist prior to using any new prescription or non-prescription medications, including herbals, vitamins, non-steroidal anti-inflammatory drugs (NSAIDs) and supplements.  This website has more information on Xarelto: [email or web address]

## 2016-09-17 NOTE — Progress Notes (Signed)
   Subjective: 1 Day Post-Op Procedure(s) (LRB): LEFT TOTAL KNEE ARTHROPLASTY (Left) Patient reports pain as mild and moderate.   Patient seen in rounds for Dr. Wynelle Link. Patient is well, but has had some minor complaints of pain in the knee, requiring pain medications We will resume therapy today.  She walked 18 feet yesterday Plan is to go Home after hospital stay.  Objective: Vital signs in last 24 hours: Temp:  [97.3 F (36.3 C)-98.7 F (37.1 C)] 97.4 F (36.3 C) (02/27 0642) Pulse Rate:  [55-72] 66 (02/27 0642) Resp:  [12-16] 16 (02/27 0642) BP: (109-128)/(50-71) 115/53 (02/27 0642) SpO2:  [95 %-100 %] 95 % (02/27 0642)  Intake/Output from previous day:  Intake/Output Summary (Last 24 hours) at 09/17/16 0848 Last data filed at 09/17/16 0837  Gross per 24 hour  Intake             6603 ml  Output             3625 ml  Net             2978 ml    Intake/Output this shift: Total I/O In: 240 [P.O.:240] Out: 400 [Urine:400]  Labs:  Recent Labs  09/17/16 0406  HGB 10.9*    Recent Labs  09/17/16 0406  WBC 9.2  RBC 3.51*  HCT 31.5*  PLT 149*    Recent Labs  09/17/16 0406  NA 139  K 3.9  CL 108  CO2 26  BUN 13  CREATININE 0.82  GLUCOSE 113*  CALCIUM 8.8*   No results for input(s): LABPT, INR in the last 72 hours.  EXAM General - Patient is Alert, Appropriate and Oriented Extremity - Neurovascular intact Sensation intact distally Intact pulses distally Dorsiflexion/Plantar flexion intact Dressing - dressing C/D/I Motor Function - intact, moving foot and toes well on exam.  Hemovac pulled without difficulty.  Past Medical History:  Diagnosis Date  . ADD (attention deficit disorder)   . Allergy   . Anemia    as a teenager  . Arthritis   . Complication of anesthesia    slow to wake up after colonoscopy  . Edema   . Hearing loss   . History of hiatal hernia    hx of  . Hypothyroidism    hx of as child and during pregnancy  . Neuromuscular  disorder ([place])   . Nocturia   . Pneumonia    hx of after bronchitis  . PONV (postoperative nausea and vomiting)   . Thyroid disease     Assessment/Plan: 1 Day Post-Op Procedure(s) (LRB): LEFT TOTAL KNEE ARTHROPLASTY (Left) Principal Problem:   OA (osteoarthritis) of knee  Estimated body mass index is 25.92 kg/m as calculated from the following:   Height as of this encounter: 5\' 4"  (1.626 m).   Weight as of this encounter: 68.5 kg (151 lb). Plan for discharge tomorrow  No HHPT - In-Home VERA Therapy  DVT Prophylaxis - Xarelto Weight-Bearing as tolerated to left leg D/C O2 and Pulse OX and try on Room Air  Arlee Muslim, PA-C Orthopaedic Surgery 09/17/2016, 8:48 AM

## 2016-09-17 NOTE — Evaluation (Signed)
Occupational Therapy Evaluation Patient Details Name: HAMNAH MCGURK MRN: [id number] DOB: 1931/03/31 Today's Date: 09/17/2016    History of Present Illness Pt s/p L TKR  and with hx of ADD and hearing deficits   Clinical Impression   Pt is s/p TKA resulting in the deficits listed below (see OT Problem List). Pt will benefit from skilled OT to increase their safety and independence with ADL and functional mobility for ADL to facilitate discharge to venue listed below.        Follow Up Recommendations  No OT follow up    Equipment Recommendations  None recommended by OT       Precautions / Restrictions Precautions Precautions: Fall;Knee Required Braces or Orthoses: Knee Immobilizer - Left Knee Immobilizer - Left: Discontinue once straight leg raise with < 10 degree lag Restrictions Weight Bearing Restrictions: No Other Position/Activity Restrictions: WBAT      Mobility Bed Mobility Overal bed mobility: Needs Assistance Bed Mobility: Supine to Sit     Supine to sit: Min assist;Min guard        Transfers Overall transfer level: Needs assistance Equipment used: Rolling walker (2 wheeled) Transfers: Sit to/from Stand Sit to Stand: Min assist;Min guard         General transfer comment: VC for hand placement         ADL Overall ADL's : Needs assistance/impaired Eating/Feeding: Set up;Sitting   Grooming: Set up;Sitting   Upper Body Bathing: Set up;Sitting   Lower Body Bathing: Minimal assistance;Sit to/from stand;Cueing for safety;Cueing for compensatory techniques   Upper Body Dressing : Set up;Sitting   Lower Body Dressing: Moderate assistance;Sit to/from stand;Cueing for safety;Cueing for sequencing;Cueing for compensatory techniques   Toilet Transfer: Minimal assistance;RW;Ambulation;Cueing for sequencing;Cueing for safety;Stand-pivot;BSC   Toileting- Clothing Manipulation and Hygiene: Minimal assistance;Sit to/from stand;Cueing for safety;Cueing  for sequencing               Vision Patient Visual Report: No change from baseline              Pertinent Vitals/Pain Pain Score: 4  Pain Location: L knee Pain Descriptors / Indicators: Aching;Sore Pain Intervention(s): Monitored during session;Repositioned;Ice applied     Hand Dominance     Extremity/Trunk Assessment Upper Extremity Assessment Upper Extremity Assessment: Overall WFL for tasks assessed           Communication Communication Communication: No difficulties   Cognition Arousal/Alertness: Awake/alert Behavior During Therapy: WFL for tasks assessed/performed Overall Cognitive Status: Within Functional Limits for tasks assessed                                Home Living Family/patient expects to be discharged to:: Private residence Living Arrangements: Alone Available Help at Discharge: Family;Friend(s);Available 24 hours/day Type of Home: House Home Access: Stairs to enter [employer] of Steps: 3 Entrance Stairs-Rails: Right Home Layout: One level     Bathroom Shower/Tub: Teacher, early years/pre: Standard     Home Equipment: Environmental consultant - 2 wheels          Prior Functioning/Environment Level of Independence: Independent                 OT Problem List: Decreased strength;Pain;Decreased knowledge of use of DME or AE      OT Treatment/Interventions: Self-care/ADL training;DME and/or AE instruction;Patient/family education    OT Goals(Current goals can be found in the care plan section) Acute Rehab OT Goals Patient  Stated Goal: Regain IND  OT Goal Formulation: With patient Time For Goal Achievement: 09/17/16  OT Frequency: Min 2X/week   Barriers to D/C:               End of Session CPM Left Knee CPM Left Knee: Off Nurse Communication: Mobility status  Activity Tolerance: Patient tolerated treatment well Patient left: in chair;with call bell/phone within reach  OT Visit Diagnosis:  Unsteadiness on feet (R26.81);Pain                ADL either performed or assessed with clinical judgement  Time: 1024-1049 OT Time Calculation (min): 25 min Charges:  OT General Charges $OT Visit: 1 Procedure OT Evaluation $OT Eval Moderate Complexity: 1 Procedure OT Treatments $Self Care/Home Management : 8-22 mins G-Codes:     Kari Baars, [place]  Payton Mccallum D 09/17/2016, 10:58 AM

## 2016-09-18 LAB — CBC
HCT: 31.8 % — ABNORMAL LOW (ref 36.0–46.0)
Hemoglobin: 11.1 g/dL — ABNORMAL LOW (ref 12.0–15.0)
MCH: 31.2 pg (ref 26.0–34.0)
MCHC: 34.9 g/dL (ref 30.0–36.0)
MCV: 89.3 fL (ref 78.0–100.0)
PLATELETS: 158 10*3/uL (ref 150–400)
RBC: 3.56 MIL/uL — ABNORMAL LOW (ref 3.87–5.11)
RDW: 12.6 % (ref 11.5–15.5)
WBC: 7.8 10*3/uL (ref 4.0–10.5)

## 2016-09-18 LAB — BASIC METABOLIC PANEL
Anion gap: 6 (ref 5–15)
BUN: 11 mg/dL (ref 6–20)
CALCIUM: 8.8 mg/dL — AB (ref 8.9–10.3)
CO2: 26 mmol/L (ref 22–32)
CREATININE: 0.66 mg/dL (ref 0.44–1.00)
Chloride: 104 mmol/L (ref 101–111)
GFR calc non Af Amer: >60 mL/min (ref >60)
Glucose, Bld: 117 mg/dL — ABNORMAL HIGH (ref 65–99)
Potassium: 3.8 mmol/L (ref 3.5–5.1)
SODIUM: 136 mmol/L (ref 135–145)

## 2016-09-18 MED ORDER — HYDROCORTISONE 1 % EX LOTN
TOPICAL_LOTION | Freq: Two times a day (BID) | CUTANEOUS | Status: DC | PRN
  Filled 2016-09-18: qty 118

## 2016-09-18 NOTE — Progress Notes (Signed)
Occupational Therapy Treatment Patient Details Name: Cheryl Martin MRN: [id number] DOB: 25-Nov-1930 Today's Date: 09/18/2016    History of present illness Pt s/p L TKR  and with hx of ADD and hearing deficits   OT comments  Pt much improved this afternoon!  OT education complete with pt.  Daughter at 50.   Follow Up Recommendations  No OT follow up;Supervision/Assistance - 24 hour    Equipment Recommendations  None recommended by OT       Precautions / Restrictions Precautions Precautions: Fall;Knee Precaution Comments: instructed on KI use for amb and proper application Required Braces or Orthoses: Knee Immobilizer - Left Knee Immobilizer - Left: Discontinue once straight leg raise with < 10 degree lag Restrictions Weight Bearing Restrictions: No Other Position/Activity Restrictions: WBAT       Mobility Bed Mobility Overal bed mobility: Needs Assistance Bed Mobility: Supine to Sit     Supine to sit: Supervision     General bed mobility comments: assist L LE  Transfers Overall transfer level: Needs assistance Equipment used: Rolling walker (2 wheeled) Transfers: Sit to/from Stand Sit to Stand: Supervision         General transfer comment: VC for hand placement        ADL Overall ADL's : Needs assistance/impaired                 Upper Body Dressing : Set up;Sitting   Lower Body Dressing: Supervision/safety;Sit to/from stand;Cueing for safety;Cueing for sequencing;Cueing for compensatory techniques   Toilet Transfer: RW;Ambulation;Cueing for sequencing;Cueing for safety;Stand-pivot;Supervision/safety   Toileting- Clothing Manipulation and Hygiene: Sit to/from stand;Cueing for safety;Cueing for sequencing;Supervision/safety     Tub/Shower Transfer Details (indicate cue type and reason): verbalized and simulated safety - daughter will A and be with pt when she does task Functional mobility during ADLs: Rolling walker;Cueing for safety;Cueing for  sequencing;Min guard General ADL Comments: pt much improved this afternoon.                  Cognition   Behavior During Therapy: WFL for tasks assessed/performed Overall Cognitive Status: Within Functional Limits for tasks assessed                                    Pertinent Vitals/ Pain       Pain Assessment: 0-10 Pain Score: 4  Pain Location: L knee Pain Descriptors / Indicators: Sore;Discomfort Pain Intervention(s): Monitored during session         Frequency  Min 2X/week        Progress Toward Goals  OT Goals(current goals can now be found in the care plan section)  Progress towards OT goals: Progressing toward goals  Acute Rehab OT Goals Patient Stated Goal: Regain IND  OT Goal Formulation: With patient Time For Goal Achievement: 09/25/16  Plan Discharge plan remains appropriate       End of Session Equipment Utilized During Treatment: Rolling walker  OT Visit Diagnosis: Unsteadiness on feet (R26.81);Pain   Activity Tolerance Patient tolerated treatment well   Patient Left in chair;with call bell/phone within reach   Nurse Communication Mobility status        Time: 1450-1510 OT Time Calculation (min): 20 min  Charges: OT General Charges $OT Visit: 1 Procedure OT Treatments $Self Care/Home Management : 8-22 mins  [place], [place]   Betsy Pries 09/18/2016, 3:23 PM

## 2016-09-18 NOTE — Progress Notes (Signed)
Physical Therapy Treatment Patient Details Name: Cheryl Martin MRN: [id number] DOB: June 21, 1931 Today's Date: 09/18/2016    History of Present Illness Pt s/p L TKR  and with hx of ADD and hearing deficits    PT Comments    POD # 2  Assisted OOB to amb to bathroom and back.  Session limited by fatigue.  Performed some TKR TE's followed by ICE.   Follow Up Recommendations   (virtual PT)     Equipment Recommendations       Recommendations for Other Services       Precautions / Restrictions Precautions Precautions: Fall;Knee Precaution Comments: instructed on KI use for amb and proper application Required Braces or Orthoses: Knee Immobilizer - Left Knee Immobilizer - Left: Discontinue once straight leg raise with < 10 degree lag Restrictions Weight Bearing Restrictions: No Other Position/Activity Restrictions: WBAT    Mobility  Bed Mobility Overal bed mobility: Needs Assistance Bed Mobility: Supine to Sit     Supine to sit: Min guard     General bed mobility comments: assist L LE  Transfers Overall transfer level: Needs assistance Equipment used: Rolling walker (2 wheeled) Transfers: Sit to/from Stand Sit to Stand: Min guard         General transfer comment: VC for hand placement  Ambulation/Gait Ambulation/Gait assistance: Min guard Ambulation Distance (Feet): 25 Feet (110 feet to and from bathroom5', ) Assistive device: Rolling walker (2 wheeled) Gait Pattern/deviations: Step-to pattern;Decreased step length - right;Decreased step length - left;Shuffle;Trunk flexed Gait velocity: decreased   General Gait Details: 25% cues for sequence, posture and position from [employer]            Wheelchair Mobility    Modified Rankin (Stroke Patients Only)       Balance                                    Cognition Arousal/Alertness: Awake/alert Behavior During Therapy: WFL for tasks assessed/performed Overall Cognitive  Status: Within Functional Limits for tasks assessed                      Exercises   Total Knee Replacement TE's 10 reps B LE ankle pumps 10 reps towel squeezes   5 reps knee presses   5  reps heel slides   5   reps SAQ's 10 reps SLR's 10 reps ABD Followed by ICE    General Comments        Pertinent Vitals/Pain Pain Assessment: 0-10 Pain Score: 4  Pain Location: L knee Pain Descriptors / Indicators: Sore;Operative site guarding Pain Intervention(s): Monitored during session;Repositioned;Ice applied    Home Living                      Prior Function            PT Goals (current goals can now be found in the care plan section) Progress towards PT goals: Progressing toward goals    Frequency    7X/week      PT Plan Current plan remains appropriate    Co-evaluation             End of Session Equipment Utilized During Treatment: Gait belt;Left knee immobilizer Activity Tolerance: Patient tolerated treatment well;Patient limited by pain Patient left: in chair;with call bell/phone within reach;with family/visitor present;with chair alarm set Nurse Communication: Mobility status PT Visit  Diagnosis: Difficulty in walking, not elsewhere classified (R26.2)     Time: 1020-1050 PT Time Calculation (min) (ACUTE ONLY): 30 min  Charges:  $Gait Training: 8-22 mins $Therapeutic Activity: 8-22 mins                    G Codes:       Rica Koyanagi  PTA [facility]  Acute  Rehab Pager      [phone]

## 2016-09-18 NOTE — Progress Notes (Signed)
Occupational Therapy Treatment Patient Details Name: Cheryl Martin MRN: [id number] DOB: June 08, 1931 Today's Date: 09/18/2016    History of present illness Pt s/p L TKR  and with hx of ADD and hearing deficits   OT comments  Pt doing well but a bit sleepy this morning.  Follow Up Recommendations  No OT follow up;Supervision/Assistance - 24 hour    Equipment Recommendations  None recommended by OT    Recommendations for Other Services      Precautions / Restrictions Precautions Precautions: Fall;Knee Required Braces or Orthoses: Knee Immobilizer - Left Knee Immobilizer - Left: Discontinue once straight leg raise with < 10 degree lag Restrictions Weight Bearing Restrictions: No Other Position/Activity Restrictions: WBAT       Mobility Bed Mobility Overal bed mobility: Needs Assistance Bed Mobility: Supine to Sit     Supine to sit: Min guard        Transfers Overall transfer level: Needs assistance Equipment used: Rolling walker (2 wheeled) Transfers: Sit to/from Stand Sit to Stand: Min guard         General transfer comment: VC for hand placement    Balance                                   ADL Overall ADL's : Needs assistance/impaired Eating/Feeding: Set up;Sitting   Grooming: Set up;Sitting   Upper Body Bathing: Set up;Sitting   Lower Body Bathing: Sit to/from stand;Cueing for safety;Cueing for compensatory techniques;Min guard   Upper Body Dressing : Set up;Sitting   Lower Body Dressing: Sit to/from stand;Cueing for safety;Cueing for sequencing;Cueing for compensatory techniques;Minimal assistance   Toilet Transfer: RW;Ambulation;Cueing for sequencing;Cueing for safety;Stand-pivot;BSC;Min guard   Toileting- Clothing Manipulation and Hygiene: Minimal assistance;Sit to/from stand;Cueing for safety;Cueing for sequencing     Tub/Shower Transfer Details (indicate cue type and reason): verbalized safety   General ADL Comments: pt  sleepy initially as she has meds for itching earlier. After getting up - pt more awake.  RN aware         Perception     Praxis      Cognition   Behavior During Therapy: WFL for tasks assessed/performed Overall Cognitive Status: Within Functional Limits for tasks assessed                                    Pertinent Vitals/ Pain       Pain Score: 3  Pain Location: L knee Pain Descriptors / Indicators: Sore Pain Intervention(s): Monitored during session         Frequency  Min 2X/week        Progress Toward Goals  OT Goals(current goals can now be found in the care plan section)  Progress towards OT goals: Progressing toward goals     Plan Discharge plan remains appropriate       End of Session Equipment Utilized During Treatment: Rolling walker  OT Visit Diagnosis: Unsteadiness on feet (R26.81);Pain   Activity Tolerance Patient limited by fatigue   Patient Left in chair;with call bell/phone within reach   Nurse Communication Mobility status        Time: [id number] OT Time Calculation (min): 18 min  Charges: OT General Charges $OT Visit: 1 Procedure OT Treatments $Self Care/Home Management : 8-22 mins  Kari Baars, [place]   Payton Mccallum D 09/18/2016, 9:58 AM

## 2016-09-18 NOTE — Progress Notes (Signed)
Physical Therapy Treatment Patient Details Name: Cheryl Martin MRN: [id number] DOB: 24-Feb-1931 Today's Date: 09/18/2016    History of Present Illness Pt s/p L TKR  and with hx of ADD and hearing deficits    PT Comments    POD # 2 pm session Practiced stairs then assisted back to bed per pt request to rest.   Pt plans to D/C around 4 pm when daughter gets off work.   Follow Up Recommendations   (virtual PT)     Equipment Recommendations       Recommendations for Other Services       Precautions / Restrictions Precautions Precautions: Fall;Knee Precaution Comments: instructed on KI use for amb and proper application Required Braces or Orthoses: Knee Immobilizer - Left Knee Immobilizer - Left: Discontinue once straight leg raise with < 10 degree lag Restrictions Weight Bearing Restrictions: No Other Position/Activity Restrictions: WBAT    Mobility  Bed Mobility Overal bed mobility: Needs Assistance Bed Mobility: Supine to Sit     Supine to sit: Min guard     General bed mobility comments: assist L LE  Transfers Overall transfer level: Needs assistance Equipment used: Rolling walker (2 wheeled) Transfers: Sit to/from Stand Sit to Stand: Min guard         General transfer comment: VC for hand placement  Ambulation/Gait Ambulation/Gait assistance: Min guard Ambulation Distance (Feet): 25 Feet (110 feet to and from bathroom5', ) Assistive device: Rolling walker (2 wheeled) Gait Pattern/deviations: Step-to pattern;Decreased step length - right;Decreased step length - left;Shuffle;Trunk flexed Gait velocity: decreased   General Gait Details: 25% cues for sequence, posture and position from RW   Stairs Stairs: Yes   Stair Management: One rail Left;Step to pattern;Forwards Number of Stairs: 2 General stair comments: 25% VC's on proper sequencing and safety   handout also given  Wheelchair Mobility    Modified Rankin (Stroke Patients Only)        Balance                                    Cognition Arousal/Alertness: Awake/alert Behavior During Therapy: WFL for tasks assessed/performed Overall Cognitive Status: Within Functional Limits for tasks assessed                      Exercises      General Comments        Pertinent Vitals/Pain Pain Assessment: 0-10 Pain Score: 4  Pain Location: L knee Pain Descriptors / Indicators: Sore;Operative site guarding Pain Intervention(s): Monitored during session;Repositioned;Ice applied    Home Living                      Prior Function            PT Goals (current goals can now be found in the care plan section) Progress towards PT goals: Progressing toward goals    Frequency    7X/week      PT Plan Current plan remains appropriate    Co-evaluation             End of Session Equipment Utilized During Treatment: Gait belt;Left knee immobilizer Activity Tolerance: Patient tolerated treatment well;Patient limited by pain Patient left: in chair;with call bell/phone within reach;with family/visitor present;with chair alarm set Nurse Communication: Mobility status PT Visit Diagnosis: Difficulty in walking, not elsewhere classified (R26.2)     Time: [id number] PT Time Calculation (  min) (ACUTE ONLY): 20 min  Charges:  $Gait Training: 8-22 mins $Therapeutic Activity: 8-22 mins                    G Codes:       Rica Koyanagi  PTA [facility]  Acute  Rehab Pager      [phone]

## 2016-09-20 ENCOUNTER — Emergency Department (HOSPITAL_COMMUNITY)
Admission: EM | Admit: 2016-09-20 | Discharge: 2016-09-20 | Disposition: A | Discharge status: Home / Self Care | Payer: Medicare Other | Attending: Emergency Medicine | Admitting: Emergency Medicine

## 2016-09-20 ENCOUNTER — Emergency Department (HOSPITAL_BASED_OUTPATIENT_CLINIC_OR_DEPARTMENT_OTHER): Admit: 2016-09-20 | Discharge: 2016-09-20 | Disposition: A | Discharge status: Home / Self Care | Payer: Medicare Other

## 2016-09-20 ENCOUNTER — Encounter (HOSPITAL_COMMUNITY): Payer: Self-pay | Admitting: Emergency Medicine

## 2016-09-20 ENCOUNTER — Emergency Department (HOSPITAL_COMMUNITY): Payer: Medicare Other

## 2016-09-20 DIAGNOSIS — M7989 Other specified soft tissue disorders: Secondary | ICD-10-CM

## 2016-09-20 DIAGNOSIS — M25562 Pain in left knee: Secondary | ICD-10-CM | POA: Diagnosis not present

## 2016-09-20 DIAGNOSIS — R51 Headache: Secondary | ICD-10-CM | POA: Insufficient documentation

## 2016-09-20 DIAGNOSIS — Z96652 Presence of left artificial knee joint: Secondary | ICD-10-CM | POA: Diagnosis not present

## 2016-09-20 DIAGNOSIS — E039 Hypothyroidism, unspecified: Secondary | ICD-10-CM | POA: Insufficient documentation

## 2016-09-20 DIAGNOSIS — Z7902 Long term (current) use of antithrombotics/antiplatelets: Secondary | ICD-10-CM | POA: Insufficient documentation

## 2016-09-20 DIAGNOSIS — Z87891 Personal history of nicotine dependence: Secondary | ICD-10-CM | POA: Diagnosis not present

## 2016-09-20 DIAGNOSIS — R509 Fever, unspecified: Secondary | ICD-10-CM | POA: Diagnosis not present

## 2016-09-20 DIAGNOSIS — R05 Cough: Secondary | ICD-10-CM | POA: Diagnosis not present

## 2016-09-20 DIAGNOSIS — F909 Attention-deficit hyperactivity disorder, unspecified type: Secondary | ICD-10-CM | POA: Insufficient documentation

## 2016-09-20 DIAGNOSIS — R519 Headache, unspecified: Secondary | ICD-10-CM

## 2016-09-20 LAB — CBC WITH DIFFERENTIAL/PLATELET
Basophils Absolute: 0 10*3/uL (ref 0.0–0.1)
Basophils Relative: 0 %
EOS ABS: 0.1 10*3/uL (ref 0.0–0.7)
EOS PCT: 1 %
HCT: 31.3 % — ABNORMAL LOW (ref 36.0–46.0)
Hemoglobin: 10.8 g/dL — ABNORMAL LOW (ref 12.0–15.0)
LYMPHS ABS: 0.6 10*3/uL — AB (ref 0.7–4.0)
LYMPHS PCT: 11 %
MCH: 30.9 pg (ref 26.0–34.0)
MCHC: 34.5 g/dL (ref 30.0–36.0)
MCV: 89.7 fL (ref 78.0–100.0)
Monocytes Absolute: 0.6 10*3/uL (ref 0.1–1.0)
Monocytes Relative: 10 %
Neutro Abs: 4.6 10*3/uL (ref 1.7–7.7)
Neutrophils Relative %: 78 %
PLATELETS: 152 10*3/uL (ref 150–400)
RBC: 3.49 MIL/uL — ABNORMAL LOW (ref 3.87–5.11)
RDW: 12.5 % (ref 11.5–15.5)
WBC: 5.9 10*3/uL (ref 4.0–10.5)

## 2016-09-20 LAB — COMPREHENSIVE METABOLIC PANEL
ALT: 15 U/L (ref 14–54)
ANION GAP: 5 (ref 5–15)
AST: 24 U/L (ref 15–41)
Albumin: 3 g/dL — ABNORMAL LOW (ref 3.5–5.0)
Alkaline Phosphatase: 64 U/L (ref 38–126)
BUN: 15 mg/dL (ref 6–20)
CHLORIDE: 106 mmol/L (ref 101–111)
CO2: 24 mmol/L (ref 22–32)
CREATININE: 0.57 mg/dL (ref 0.44–1.00)
Calcium: 8.1 mg/dL — ABNORMAL LOW (ref 8.9–10.3)
Glucose, Bld: 102 mg/dL — ABNORMAL HIGH (ref 65–99)
Potassium: 3.7 mmol/L (ref 3.5–5.1)
Sodium: 135 mmol/L (ref 135–145)
Total Bilirubin: 0.8 mg/dL (ref 0.3–1.2)
Total Protein: 5.4 g/dL — ABNORMAL LOW (ref 6.5–8.1)

## 2016-09-20 LAB — URINALYSIS, ROUTINE W REFLEX MICROSCOPIC
Bilirubin Urine: NEGATIVE
Glucose, UA: NEGATIVE mg/dL
HGB URINE DIPSTICK: NEGATIVE
Ketones, ur: 20 mg/dL — AB
Leukocytes, UA: NEGATIVE
NITRITE: NEGATIVE
Protein, ur: NEGATIVE mg/dL
SPECIFIC GRAVITY, URINE: 1.014 (ref 1.005–1.030)
pH: 7 (ref 5.0–8.0)

## 2016-09-20 MED ORDER — KETOROLAC TROMETHAMINE 30 MG/ML IJ SOLN
30.0000 mg | Freq: Once | INTRAMUSCULAR | Status: AC
Start: 2016-09-20 — End: 2016-09-20
  Administered 2016-09-20: 30 mg via INTRAVENOUS
  Filled 2016-09-20: qty 1

## 2016-09-20 MED ORDER — FENTANYL CITRATE (PF) 100 MCG/2ML IJ SOLN: 50.0000 ug | INTRAMUSCULAR | Status: DC | PRN

## 2016-09-20 MED ORDER — MORPHINE SULFATE (PF) 4 MG/ML IV SOLN: 4.0000 mg | Freq: Once | INTRAVENOUS | Status: DC

## 2016-09-20 MED ORDER — MORPHINE SULFATE (PF) 4 MG/ML IV SOLN
2.0000 mg | Freq: Once | INTRAVENOUS | Status: AC
  Administered 2016-09-20: 2 mg via INTRAVENOUS
  Filled 2016-09-20: qty 1

## 2016-09-20 MED ORDER — SODIUM CHLORIDE 0.9 % IV BOLUS (SEPSIS)
1000.0000 mL | Freq: Once | INTRAVENOUS | Status: AC
  Administered 2016-09-20: 1000 mL via INTRAVENOUS
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
## 2016-09-20 NOTE — ED Notes (Signed)
Daughter called. Gave update. Plan to d/c pt once she is more awake. Daughter reports pt did not sleep well last night. Daughter has neighbor who has volunteered to pick pt up at 1400.

## 2016-09-20 NOTE — ED Triage Notes (Signed)
Patient BIB EMS from home for headache, n/v, and left knee pain starting 3/1. Patient had left knee replacement Monday. Patient states she did new exercises that were too hard and knee has been hurting since. Pt has also had headache yesterday afternoon that has progressively gotten worse. Patient also reports vomiting 3 times. Temp per EMS 101.5, pt given 1000 mg tylenol in route to hospital. EMS reports no obvious infection at surgical site.

## 2016-09-20 NOTE — Discharge Instructions (Signed)
Take medication as directed.  See Dr. Maureen Ralphs as scheduled

## 2016-09-20 NOTE — Progress Notes (Signed)
*  Preliminary Results* Left lower extremity venous duplex completed. Left lower extremity is negative for deep vein thrombosis. There is no evidence of left Baker's cyst.  09/20/2016 9:40 AM  Maudry Mayhew, BS, RVT, RDCS, RDMS

## 2016-09-20 NOTE — ED Notes (Signed)
Bed: [id number] Expected date:  Expected time:  Means of arrival:  Comments: 83 yo F  Fever, vomiting, etc

## 2016-09-20 NOTE — ED Notes (Signed)
Patient transported to X-ray 

## 2016-09-23 DIAGNOSIS — M25662 Stiffness of left knee, not elsewhere classified: Secondary | ICD-10-CM | POA: Diagnosis not present

## 2016-09-25 LAB — CULTURE, BLOOD (ROUTINE X 2)
CULTURE: NO GROWTH
Culture: NO GROWTH

## 2016-10-01 DIAGNOSIS — Z96652 Presence of left artificial knee joint: Secondary | ICD-10-CM | POA: Diagnosis not present

## 2016-10-01 DIAGNOSIS — Z471 Aftercare following joint replacement surgery: Secondary | ICD-10-CM | POA: Diagnosis not present

## 2016-10-01 DIAGNOSIS — M1712 Unilateral primary osteoarthritis, left knee: Secondary | ICD-10-CM | POA: Diagnosis not present

## 2016-10-08 DIAGNOSIS — Z96652 Presence of left artificial knee joint: Secondary | ICD-10-CM | POA: Diagnosis not present

## 2016-10-08 DIAGNOSIS — Z471 Aftercare following joint replacement surgery: Secondary | ICD-10-CM | POA: Diagnosis not present

## 2016-10-22 DIAGNOSIS — Z471 Aftercare following joint replacement surgery: Secondary | ICD-10-CM | POA: Diagnosis not present

## 2016-10-22 DIAGNOSIS — Z96652 Presence of left artificial knee joint: Secondary | ICD-10-CM | POA: Diagnosis not present

## 2016-12-05 DIAGNOSIS — Z471 Aftercare following joint replacement surgery: Secondary | ICD-10-CM | POA: Diagnosis not present

## 2016-12-05 DIAGNOSIS — Z96652 Presence of left artificial knee joint: Secondary | ICD-10-CM | POA: Diagnosis not present

## 2016-12-23 NOTE — Anesthesia Postprocedure Evaluation (Signed)
Anesthesia Post Note  Patient: Cheryl Martin  Procedure(s) Performed: Procedure(s) (LRB): LEFT TOTAL KNEE ARTHROPLASTY (Left)     Anesthesia Post Evaluation  Last Vitals:  Vitals:   09/18/16 0557 09/18/16 1341  BP: (!) 127/49 (!) 149/51  Pulse: 73 78  Resp: 16 18  Temp: 36.8 C 36.6 C    Last Pain:  Vitals:   09/18/16 1645  TempSrc:   PainSc: 3                  Jahnavi Muratore S

## 2016-12-23 NOTE — Addendum Note (Signed)
Addendum  created 12/23/16 1133 by Myrtie Soman, MD   Sign clinical note

## 2017-02-12 DIAGNOSIS — N3941 Urge incontinence: Secondary | ICD-10-CM | POA: Diagnosis not present

## 2017-02-12 DIAGNOSIS — R351 Nocturia: Secondary | ICD-10-CM | POA: Diagnosis not present

## 2017-02-25 ENCOUNTER — Telehealth: Payer: Self-pay | Admitting: Family Medicine

## 2017-02-25 NOTE — Telephone Encounter (Signed)
Pt called states going for dental appt & just had surgery 10 days ago, she says previously she was given an antibiotic by previous provider when in similar Situations and request doctor Opalski call in just 1 dose of antibiotics for her-- Please contact patient if there are any questions. --glh

## 2017-02-27 NOTE — Telephone Encounter (Signed)
Spoke to patient, she had talked with her orthopedic about the issue and was told that she did not need antibiotic. MPulliam, CMA/RT(R)

## 2017-05-20 ENCOUNTER — Encounter (HOSPITAL_COMMUNITY): Payer: Self-pay | Admitting: Emergency Medicine

## 2017-05-20 ENCOUNTER — Emergency Department (HOSPITAL_COMMUNITY)
Admission: EM | Admit: 2017-05-20 | Discharge: 2017-05-21 | Disposition: A | Discharge status: Home / Self Care | Payer: Medicare Other | Attending: Emergency Medicine | Admitting: Emergency Medicine

## 2017-05-20 ENCOUNTER — Emergency Department (HOSPITAL_COMMUNITY): Payer: Medicare Other

## 2017-05-20 DIAGNOSIS — Z7901 Long term (current) use of anticoagulants: Secondary | ICD-10-CM | POA: Diagnosis not present

## 2017-05-20 DIAGNOSIS — Z79899 Other long term (current) drug therapy: Secondary | ICD-10-CM | POA: Diagnosis not present

## 2017-05-20 DIAGNOSIS — K529 Noninfective gastroenteritis and colitis, unspecified: Secondary | ICD-10-CM | POA: Insufficient documentation

## 2017-05-20 DIAGNOSIS — M545 Low back pain: Secondary | ICD-10-CM | POA: Insufficient documentation

## 2017-05-20 DIAGNOSIS — R197 Diarrhea, unspecified: Secondary | ICD-10-CM | POA: Diagnosis present

## 2017-05-20 DIAGNOSIS — K2970 Gastritis, unspecified, without bleeding: Secondary | ICD-10-CM | POA: Diagnosis not present

## 2017-05-20 DIAGNOSIS — Z87891 Personal history of nicotine dependence: Secondary | ICD-10-CM | POA: Diagnosis not present

## 2017-05-20 DIAGNOSIS — R112 Nausea with vomiting, unspecified: Secondary | ICD-10-CM | POA: Diagnosis not present

## 2017-05-20 DIAGNOSIS — E039 Hypothyroidism, unspecified: Secondary | ICD-10-CM | POA: Diagnosis not present

## 2017-05-20 DIAGNOSIS — K5730 Diverticulosis of large intestine without perforation or abscess without bleeding: Secondary | ICD-10-CM | POA: Diagnosis not present

## 2017-05-20 LAB — CBC
HCT: 38.6 % (ref 36.0–46.0)
Hemoglobin: 13.1 g/dL (ref 12.0–15.0)
MCH: 30.8 pg (ref 26.0–34.0)
MCHC: 33.9 g/dL (ref 30.0–36.0)
MCV: 90.6 fL (ref 78.0–100.0)
PLATELETS: 219 10*3/uL (ref 150–400)
RBC: 4.26 MIL/uL (ref 3.87–5.11)
RDW: 12.8 % (ref 11.5–15.5)
WBC: 8.5 10*3/uL (ref 4.0–10.5)

## 2017-05-20 LAB — COMPREHENSIVE METABOLIC PANEL
ALT: 16 U/L (ref 14–54)
AST: 25 U/L (ref 15–41)
Albumin: 4.4 g/dL (ref 3.5–5.0)
Alkaline Phosphatase: 93 U/L (ref 38–126)
Anion gap: 8 (ref 5–15)
BUN: 19 mg/dL (ref 6–20)
CHLORIDE: 104 mmol/L (ref 101–111)
CO2: 26 mmol/L (ref 22–32)
CREATININE: 0.88 mg/dL (ref 0.44–1.00)
Calcium: 9.5 mg/dL (ref 8.9–10.3)
GFR calc Af Amer: >60 mL/min (ref >60)
GFR, EST NON AFRICAN AMERICAN: 59 mL/min — AB (ref >60)
Glucose, Bld: 105 mg/dL — ABNORMAL HIGH (ref 65–99)
Potassium: 3.8 mmol/L (ref 3.5–5.1)
SODIUM: 138 mmol/L (ref 135–145)
Total Bilirubin: 0.9 mg/dL (ref 0.3–1.2)
Total Protein: 6.6 g/dL (ref 6.5–8.1)

## 2017-05-20 LAB — URINALYSIS, ROUTINE W REFLEX MICROSCOPIC
Bilirubin Urine: NEGATIVE
GLUCOSE, UA: NEGATIVE mg/dL
HGB URINE DIPSTICK: NEGATIVE
KETONES UR: 5 mg/dL — AB
Leukocytes, UA: NEGATIVE
Nitrite: NEGATIVE
PROTEIN: NEGATIVE mg/dL
Specific Gravity, Urine: 1.016 (ref 1.005–1.030)
pH: 6 (ref 5.0–8.0)

## 2017-05-20 LAB — LIPASE, BLOOD: LIPASE: 26 U/L (ref 11–51)

## 2017-05-20 MED ORDER — ONDANSETRON HCL 4 MG/2ML IJ SOLN
4.0000 mg | Freq: Once | INTRAMUSCULAR | Status: AC
  Administered 2017-05-20: 4 mg via INTRAVENOUS
  Filled 2017-05-20: qty 2

## 2017-05-20 MED ORDER — SODIUM CHLORIDE 0.9 % IV BOLUS (SEPSIS)
1000.0000 mL | Freq: Once | INTRAVENOUS | Status: AC
  Administered 2017-05-20: 1000 mL via INTRAVENOUS

## 2017-05-20 MED ORDER — CIPROFLOXACIN HCL 500 MG PO TABS: 500.0000 mg | ORAL_TABLET | Freq: Two times a day (BID) | ORAL | 0 refills | Status: AC

## 2017-05-20 MED ORDER — METRONIDAZOLE 500 MG PO TABS
500.0000 mg | ORAL_TABLET | Freq: Once | ORAL | Status: AC
  Administered 2017-05-20: 500 mg via ORAL
  Filled 2017-05-20: qty 1

## 2017-05-20 MED ORDER — TRAMADOL HCL 50 MG PO TABS
50.0000 mg | ORAL_TABLET | Freq: Once | ORAL | Status: DC
  Filled 2017-05-20: qty 1

## 2017-05-20 MED ORDER — MORPHINE SULFATE (PF) 4 MG/ML IV SOLN
4.0000 mg | Freq: Once | INTRAVENOUS | Status: AC
  Administered 2017-05-20: 4 mg via INTRAVENOUS
  Filled 2017-05-20: qty 1

## 2017-05-20 MED ORDER — DICYCLOMINE HCL 20 MG PO TABS: 20.0000 mg | ORAL_TABLET | Freq: Two times a day (BID) | ORAL | 0 refills | Status: DC

## 2017-05-20 MED ORDER — IOPAMIDOL (ISOVUE-300) INJECTION 61%
INTRAVENOUS | Status: AC
  Filled 2017-05-20: qty 100

## 2017-05-20 MED ORDER — IOPAMIDOL (ISOVUE-300) INJECTION 61%
100.0000 mL | Freq: Once | INTRAVENOUS | Status: AC | PRN
  Administered 2017-05-20: 100 mL via INTRAVENOUS

## 2017-05-20 MED ORDER — METRONIDAZOLE 500 MG PO TABS: 500.0000 mg | ORAL_TABLET | Freq: Three times a day (TID) | ORAL | 0 refills | Status: AC

## 2017-05-20 MED ORDER — CIPROFLOXACIN HCL 500 MG PO TABS
500.0000 mg | ORAL_TABLET | Freq: Once | ORAL | Status: AC
  Administered 2017-05-20: 500 mg via ORAL
  Filled 2017-05-20: qty 1

## 2017-05-20 MED ORDER — DICYCLOMINE HCL 10 MG PO CAPS
20.0000 mg | ORAL_CAPSULE | Freq: Once | ORAL | Status: AC
  Administered 2017-05-20: 20 mg via ORAL
  Filled 2017-05-20: qty 2

## 2017-05-20 NOTE — ED Notes (Signed)
Pt. Made aware for the need of urine specimen. 

## 2017-05-20 NOTE — ED Triage Notes (Signed)
Per pt, states she started having abdominal cramping and diarrhea around 5 pm-has history of constipation and had same symptoms last week

## 2017-05-20 NOTE — Discharge Instructions (Signed)
Call Dr. Raliegh Scarlet tomorrow to discuss your ER visit and to send STOOL STUDIES for further work-up  Return to the ER with worsening pain, bleeding, nausea/vomiting ,fever, or other concerning symptoms
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
## 2017-05-21 NOTE — ED Notes (Addendum)
Pt. Attempted to use bathroom to be able to collect stool sample for lab, unsuccessful. To ff up with her PCP.

## 2017-05-22 ENCOUNTER — Encounter: Payer: Self-pay | Admitting: Family Medicine

## 2017-05-22 ENCOUNTER — Ambulatory Visit (INDEPENDENT_AMBULATORY_CARE_PROVIDER_SITE_OTHER): Payer: Medicare Other | Admitting: Family Medicine

## 2017-05-22 VITALS — BP 112/66 | HR 52 | Ht 63.75 in | Wt 149.0 lb

## 2017-05-22 DIAGNOSIS — R32 Unspecified urinary incontinence: Secondary | ICD-10-CM | POA: Diagnosis not present

## 2017-05-22 DIAGNOSIS — K529 Noninfective gastroenteritis and colitis, unspecified: Secondary | ICD-10-CM | POA: Diagnosis not present

## 2017-05-22 DIAGNOSIS — R1032 Left lower quadrant pain: Secondary | ICD-10-CM

## 2017-05-22 DIAGNOSIS — R195 Other fecal abnormalities: Secondary | ICD-10-CM | POA: Diagnosis not present

## 2017-05-22 DIAGNOSIS — K921 Melena: Secondary | ICD-10-CM

## 2017-05-22 NOTE — Progress Notes (Signed)
Pt here for an acute care OV today   Impression and Recommendations:    1. Colitis presumed to be due to infection   2. Abnormal stool caliber   3. Blood clots in stool   4. Left lower quadrant pain   5. Urinary incontinence, unspecified type    -Continue Cipro, Flagyl and use Bentyl as needed. -Referral to gastroenterology for repeat colonoscopy.  Patient prefers/requests to be sent to a female provider.   -Asked patient to get her old colonoscopy records for me as well as she will need this for the gastroenterologist cc in the near future. -Advised patient to eat multiple small meals a day;  avoid trigger foods.  Would avoid spicy, fried, citrusy, rich dishes and dairy and creams etc.  Advised to follow a brat diet until seen by gastroenterology.  Try to identify trigger foods.  Adequate hydration discussed.    -Refilled her Ditropan XL   The patient was counseled, risk factors were discussed, anticipatory guidance given.   Meds ordered this encounter  Medications  . oxybutynin (DITROPAN-XL) 10 MG 24 hr tablet    Sig: Take 1 tablet by mouth daily.  Marland Kitchen aspirin EC 81 MG tablet    Sig: Take 81 mg by mouth daily.    Orders Placed This Encounter  Procedures  . Ambulatory referral to Gastroenterology     Gross side effects, risk and benefits, and alternatives of medications and treatment plan in general discussed with patient.  Patient is aware that all medications have potential side effects and we are unable to predict every side effect or drug-drug interaction that may occur.   Patient will call with any questions prior to using medication if they have concerns.  Expresses verbal understanding and consents to current therapy and treatment regimen.  No barriers to understanding were identified.  Red flag symptoms and signs discussed in detail.  Patient expressed understanding regarding what to do in case of emergency\urgent symptoms  Please see AVS handed out to patient at  the end of our visit for further patient instructions/ counseling done pertaining to today's office visit.   Return for 1-2 wks- f/up with me re: stool and abd pain/ diarh.     Note: This document was prepared using Dragon voice recognition software and may include unintentional dictation errors.  Cheryl Martin 1:01 PM --------------------------------------------------------------------------------------------------------------------------------------------------------------------------------------------------------------------------------------------    Subjective:    CC:  Chief Complaint  Patient presents with  . Abdominal Pain    abd pains and diarrhea 2 days ago - patient went to the ED    HPI: Cheryl Martin is a 83 y.o. female who presents to [place] [facility] today for issues as discussed below.  Patient had a normal colonoscopy in 2008, beginning of the year in January or February-patient cannot recall who did it.    Recently seen in the ER for abdominal pain, bloody diarrhea- see notes below which I copied and pasted as well as reviewed in total.  CT scan showed colitis and thickening at the splenic flexure flexure of her colon.  Also had some diverticula but no diverticulitis.  She was treated for the colitis with Cipro, Flagyl and Bentyl and was told to follow-up for colonoscopy\ GI referral in 1 week.  -Patient has had very good resolution of the symptoms with this treatment regimen.    She has been able to eat and drink without difficulty.  No complaints today.  However, she is very worried  that there might be a mass or something in there and would like to follow-up with gastroenterology as instructed as soon as possible. ED notes from this visit reviewed by myself: See below-->    " [facility]PT Provider Note   CSN: [id number] Arrival date & time: 05/20/17  [place]     History              Chief  Complaint    Chief Complaint  Patient presents with  . Diarrhea    HPI Cheryl Martin is a 83 y.o. female.  HPI   83 year old female with past medical history as below who presents with diarrhea.  The patient reports that over the last several months, she has had ongoing, recurrent episodes of painful abdominal pain followed by solid and loose stools.  The stools occasionally blood-tinged.  She had an episode last week that seemed to resolve over 24 hours so she did not seek medical treatment.  She returns today due to ongoing cramp-like, severe abdominal pain.  She has had associated loose stools. Her abdominal pain is aching, cramp like, worse before having a BM then improves.  --- --- CLINICAL DATA:  83 year old female with left lower quadrant abdominal pain.  EXAM: CT ABDOMEN AND PELVIS WITH CONTRAST  TECHNIQUE: Multidetector CT imaging of the abdomen and pelvis was performed using the standard protocol following bolus administration of intravenous contrast.  CONTRAST:  139mL ISOVUE-300 IOPAMIDOL (ISOVUE-300) INJECTION 61%  COMPARISON:  None.  FINDINGS: Lower chest: Minimal bibasilar dependent atelectatic changes of the lungs. There is a 7 mm right middle lobe subpleural nodule (series 3 image 12). There is coronary vascular calcification.  There is no intra-abdominal free air or free fluid.  Hepatobiliary: Cholecystectomy. There is mild biliary ductal dilatation, likely post cholecystectomy. The liver is unremarkable.  Pancreas: Unremarkable. No pancreatic ductal dilatation or surrounding inflammatory changes.  Spleen: Normal in size without focal abnormality.  Adrenals/Urinary Tract: The adrenal glands are unremarkable. There is an extrarenal pelvis on the right with mild pelviectasis. There is no hydronephrosis on either side. There is symmetric enhancement and excretion of contrast by both kidneys. The visualized ureters and urinary bladder  appear unremarkable.  Stomach/Bowel: There is sigmoid diverticulosis without active inflammatory changes. There is mild thickened appearance of the colon primarily involving the splenic flexure concerning for colitis. Correlation with clinical exam and stool cultures recommended. There is no evidence of bowel obstruction. There is a moderate size hiatal hernia. The appendix is unremarkable. Normal caliber fecalized loops of distal small bowel may represent increased transit time versus small intestinal bacterial overgrowth. Clinical correlation is recommended.  Vascular/Lymphatic: There is advanced aortoiliac atherosclerotic disease. The origins of the celiac axis, SMA, and IMA remain patent. No portal venous gas identified. There is no adenopathy.  Reproductive: Hysterectomy.  No pelvic mass.  Other: None  Musculoskeletal: There is osteopenia with scoliosis and degenerative changes of the spine. No acute osseous pathology.  IMPRESSION: 1. Thickened appearance of the splenic flexure concerning for colitis. Correlation with clinical exam and stool cultures recommended. No bowel obstruction. Normal appendix. 2. Sigmoid diverticulosis without active inflammatory changes. 3.  Aortic Atherosclerosis (ICD10-I70.0).   Electronically Signed   By: Anner Crete M.D.   On: 05/20/2017 22:01     --- ---  Initial Impression / Assessment and Plan / ED Course  I have reviewed the triage vital signs and the nursing notes.  Pertinent labs & imaging results that were available during my  care of the patient were reviewed by me and considered in my medical decision making (see chart for details).   83 year old female here with intermittent, cramp-like abdominal pain.  Lab work is overall very reassuring with normal white blood cell count.  CT abdomen and pelvis shows colitis without obstruction or perforation.  Patient does report intermittent blood-tinged diarrhea but this  is been ongoing for several months and her hemoglobin is actually above its baseline.  She is not on blood thinners.  Discussed case and care with the patient and daughter in detail.  The patient feels improved in the ED, is tolerating p.o., and would like to attempt outpatient management.  I feel this is reasonable.  Given her recurrent colitis and reported change in stool caliber, I am concerned about possible underlying colonic abnormality.  I discussed the need for close outpatient follow-up for stool studies as well as referral for colonoscopy once colitis is resolved.  Normal white count, no recent antibiotic use to suggest C. difficile.  This note was prepared with assistance of Systems analyst. Occasional wrong-word or sound-a-like substitutions may have occurred due to the inherent limitations of voice recognition software.  Final Clinical Impressions(s) / ED Diagnoses   Final diagnoses:  Colitis    New Prescriptions    Discharge Medication List as of 05/20/2017 11:43 PM       START taking these medications   Details  ciprofloxacin (CIPRO) 500 MG tablet Take 1 tablet (500 mg total) by mouth 2 (two) times daily., Starting Tue 05/20/2017, Until Fri 05/30/2017, Print    dicyclomine (BENTYL) 20 MG tablet Take 1 tablet (20 mg total) by mouth 2 (two) times daily., Starting Tue 05/20/2017, Until Tue 05/27/2017, Print    metroNIDAZOLE (FLAGYL) 500 MG tablet Take 1 tablet (500 mg total) by mouth 3 (three) times daily., Starting Tue 05/20/2017, Until Fri 05/30/2017, Print         Duffy Bruce, MD 05/21/17 0147"    Wt Readings from Last 3 Encounters:  05/22/17 149 lb (67.6 kg)  05/20/17 140 lb (63.5 kg)  09/20/16 145 lb (65.8 kg)   BP Readings from Last 3 Encounters:  05/22/17 112/66  05/21/17 118/64  09/20/16 116/55   BMI Readings from Last 3 Encounters:  05/22/17 25.78 kg/m  05/20/17 24.03 kg/m  09/20/16 24.89 kg/m     Patient Care  Team    Relationship Specialty Notifications Start End  [name], DO PCP - General Family Medicine  02/20/16   Elsie Saas, MD Consulting Physician Orthopedic Surgery  04/19/15   Foye Spurling, MD Consulting Physician Endocrinology  02/21/16   Zadie Rhine Clent Demark, MD Consulting Physician Ophthalmology  02/21/16   Harriett Sine, MD Consulting Physician Dermatology  03/14/16   Angelia Mould, MD Consulting Physician Vascular Surgery  04/11/16   [name], MD Consulting Physician Orthopedic Surgery  05/20/16      Patient Active Problem List   Diagnosis Date Noted  . Urgency incontinence 05/24/2016    Priority: High  . h/o Hyperlipidemia, mild 02/24/2016    Priority: High  . Vitamin D deficiency 02/22/2016    Priority: High  . ADD (attention deficit disorder) 02/19/2011    Priority: High  . h/o low B12- on supp 02/24/2016    Priority: Medium  . thyroid disease- at time did not warrant txmnt 02/19/2011    Priority: Medium  . Patient has healthcare proxy and living will 07/20/2016    Priority: Low  . OA (osteoarthritis) of  knee 04/15/2016    Priority: Low  . Status post hysterectomy  02/21/2016    Priority: Low  . Urinary incontinence 05/23/2017  . Preoperative general physical examination 07/20/2016  . Chronic pain of left knee 04/11/2016  . Senile solar keratosis 03/14/2016  . Peripheral edema- bilateral lower extremities 03/14/2016  . ETD- Sx of L ear 03/14/2016  . Gen chronic Fatigue 02/24/2016  . Decreased hearing 02/19/2011    Past Medical history, Surgical history, Family history, Social history, Allergies and Medications have been entered into the medical record, reviewed and changed as needed.    Current Meds  Medication Sig  . acetaminophen (TYLENOL) 500 MG tablet Take 1,000 mg by mouth every 6 (six) hours as needed for mild pain.  Marland Kitchen aspirin EC 81 MG tablet Take 81 mg by mouth daily.  . ciprofloxacin (CIPRO) 500 MG tablet Take 1 tablet (500 mg  total) by mouth 2 (two) times daily.  Marland Kitchen dicyclomine (BENTYL) 20 MG tablet Take 1 tablet (20 mg total) by mouth 2 (two) times daily.  . metroNIDAZOLE (FLAGYL) 500 MG tablet Take 1 tablet (500 mg total) by mouth 3 (three) times daily.    Allergies:  Allergies  Allergen Reactions  . Adhesive [Tape] Other (See Comments)    SKIN TEARS PATIENT TOLERATES PAPER TAPE  . Food     Patient is a vegetarian (does eat FISH)     Review of Systems: General:   Denies fever, chills, unexplained weight loss.  Optho/Auditory:   Denies visual changes, blurred vision/LOV Respiratory:   Denies wheeze, DOE more than baseline levels.  Cardiovascular:   Denies chest pain, palpitations, new onset peripheral edema  Gastrointestinal:   Denies nausea, vomiting, diarrhea, abd pain.  Genitourinary: Denies dysuria, freq/ urgency, flank pain or discharge from genitals.  Endocrine:     Denies hot or cold intolerance, polyuria, polydipsia. Musculoskeletal:   Denies unexplained myalgias, joint swelling, unexplained arthralgias, gait problems.  Skin:  Denies new onset rash, suspicious lesions Neurological:     Denies dizziness, unexplained weakness, numbness  Psychiatric/Behavioral:   Denies mood changes, suicidal or homicidal ideations, hallucinations    Objective:   Blood pressure 112/66, pulse (!) 52, height 5' 3.75" (1.619 m), weight 149 lb (67.6 kg). Body mass index is 25.78 kg/m. General:  Well Developed, well nourished, appropriate for stated age.  Neuro:  Alert and oriented,  extra-ocular muscles intact  HEENT:  Normocephalic, atraumatic, neck supple Skin:  no gross rash, warm, pink. Cardiac:  RRR, S1 S2 Respiratory:  ECTA B/L and A/P, Not using accessory muscles, speaking in full sentences- unlabored. ABD:  Bowel sounds x4, slightly hypoactive.  No guarding rigidity rebound.  Mild distention globally.  No organomegaly / mass appreciated Vascular:  Ext warm, no cyanosis apprec.; cap RF less 2 sec. Psych:   No HI/SI, judgement and insight good, Euthymic mood. Full Affect.

## 2017-05-23 ENCOUNTER — Ambulatory Visit: Payer: Medicare Other | Admitting: Family Medicine

## 2017-05-23 DIAGNOSIS — R32 Unspecified urinary incontinence: Secondary | ICD-10-CM | POA: Insufficient documentation

## 2017-05-23 NOTE — Patient Instructions (Addendum)
Had discussion with patient eat multiple small meals a day, avoid trigger foods.  Would avoid spicy, fried, citrusy, rich dishes and dairy and creams etc.  Advised to follow a brat diet until seen by gastroenterology.  Try to identify trigger foods.  Adequate hydration discussed.       Colitis  Colitis is inflammation of the colon. Colitis may last a short time (acute) or it may last a long time (chronic). What are the causes? This condition may be caused by:  Viruses.  Bacteria.  Reactions to medicine.  Certain autoimmune diseases, such as Crohn disease or ulcerative colitis.  What are the signs or symptoms? Symptoms of this condition include:  Diarrhea.  Passing bloody or tarry stool.  Pain.  Fever.  Vomiting.  Tiredness (fatigue).  Weight loss.  Bloating.  Sudden increase in abdominal pain.  Having fewer bowel movements than usual.  How is this diagnosed? This condition is diagnosed with a stool test or a blood test. You may also have other tests, including X-rays, a CT scan, or a colonoscopy. How is this treated? Treatment may include:  Resting the bowel. This involves not eating or drinking for a period of time.  Fluids that are given through an IV tube.  Medicine for pain and diarrhea.  Antibiotic medicines.  Cortisone medicines.  Surgery.  Follow these instructions at home: Eating and drinking  Follow instructions from your health care provider about eating or drinking restrictions.  Drink enough fluid to keep your urine clear or pale yellow.  Work with a dietitian to determine which foods cause your condition to flare up.  Avoid foods that cause flare-ups.  Eat a well-balanced diet. Medicines  Take over-the-counter and prescription medicines only as told by your health care provider.  If you were prescribed an antibiotic medicine, take it as told by your health care provider. Do not stop taking the antibiotic even if you start to feel  better. General instructions  Keep all follow-up visits as told by your health care provider. This is important. Contact a health care provider if:  Your symptoms do not go away.  You develop new symptoms. Get help right away if:  You have a fever that does not go away with treatment.  You develop chills.  You have extreme weakness, fainting, or dehydration.  You have repeated vomiting.  You develop severe pain in your abdomen.  You pass bloody or tarry stool. This information is not intended to replace advice given to you by your health care provider. Make sure you discuss any questions you have with your health care provider. Document Released: 08/15/2004 Document Revised: 12/14/2015 Document Reviewed: 10/31/2014 Elsevier Interactive Patient Education  [employer].

## 2017-05-27 ENCOUNTER — Other Ambulatory Visit: Payer: Self-pay

## 2017-05-27 DIAGNOSIS — R1032 Left lower quadrant pain: Secondary | ICD-10-CM

## 2017-05-27 DIAGNOSIS — K921 Melena: Secondary | ICD-10-CM

## 2017-05-27 DIAGNOSIS — R195 Other fecal abnormalities: Secondary | ICD-10-CM

## 2017-05-27 DIAGNOSIS — K529 Noninfective gastroenteritis and colitis, unspecified: Secondary | ICD-10-CM

## 2017-05-28 ENCOUNTER — Encounter: Payer: Self-pay | Admitting: Nurse Practitioner

## 2017-06-03 ENCOUNTER — Encounter: Payer: Self-pay | Admitting: Family Medicine

## 2017-06-03 ENCOUNTER — Ambulatory Visit (INDEPENDENT_AMBULATORY_CARE_PROVIDER_SITE_OTHER): Payer: Medicare Other | Admitting: Family Medicine

## 2017-06-03 VITALS — BP 123/74 | HR 64 | Ht 63.75 in | Wt 148.9 lb

## 2017-06-03 DIAGNOSIS — R911 Solitary pulmonary nodule: Secondary | ICD-10-CM

## 2017-06-03 DIAGNOSIS — K5909 Other constipation: Secondary | ICD-10-CM

## 2017-06-03 DIAGNOSIS — K529 Noninfective gastroenteritis and colitis, unspecified: Secondary | ICD-10-CM

## 2017-06-03 DIAGNOSIS — K921 Melena: Secondary | ICD-10-CM

## 2017-06-03 LAB — POC HEMOCCULT BLD/STL (HOME/3-CARD/SCREEN)
Card #3 Fecal Occult Blood, POC: NEGATIVE
FECAL OCCULT BLD: NEGATIVE
Fecal Occult Blood, POC: POSITIVE — AB

## 2017-06-03 NOTE — Progress Notes (Signed)
Hospital/ED follow-up Note   Impression and Recommendations:    1. Colitis   2. Blood in the stool   3. Chronic constipation   4. Incidental lung nodule-7 mm right middle lobe subpleural nodule    - reassurring exam today.  - Run Hemoccult on stool today.   She had collected stool from 11 /1, 11 -5 and 11- 7.  The first 1 was mildly positive and the one on the fifth and seventh were negative for blood.  This was very reassuring. - Continue oral Flagyl till gone, As appears likely colitis  - Incidental finding on CT of 7 mm right lobe nodule.  We will follow-up with low-dose CT scan in 6-12 months for this. - Follow-up with gastroenterology in 2 days.   Education and routine counseling performed. Handouts provided.  Please see AVS handed out to patient at the end of our visit for further patient instructions/ counseling done pertaining to today's office visit.   Return for To 3 months to see how she is doing with her GI symptoms.     Note: This note was prepared with assistance of Dragon voice recognition software. Occasional wrong-word or sound-a-like substitutions may have occurred due to the inherent limitations of voice recognition software.  [name] 9:18 AM --------------------------------------------------------------------------------------------------------------------------------------------------------------------------------------------------------------------------------------------    Subjective:    CC:  Chief Complaint  Patient presents with  . Follow-up    HPI: Cheryl Martin is a 83 y.o. female who presents to [place] at [facility] today for issues as discussed below.  Seen in the ED on 05/20/2017 she was diagnosed with colitis.  I reviewed the entire note and the labs. Saw her in f/up couple days later in an OV.  She is doing well. Her symptoms have essentially subsided.  She has finished the Cipro and And is still  continuing the Flagyl.  She has not needed the Bentyl.  She denies any abdominal pain or cramping.  She does have chronic constipation but has been taking MiraLAX daily and has been moving her bowels on a daily basis.  She has appointment to see [facility] gastroenterology on the 15th in 2 days.  Patient did bring a stool sample in today.  We will do a Hemoccult test on it  We also discussed her CT abdomen pelvis results and there was an into the incidental 7 mm right middle lobe subpleural nodule found on series 3 image 12.  I recommended the patient we do a 6-89-month follow-up repeat low-dose CT scan for follow-up of this lung nodule in her right middle lobe.  -------------------------------------------------------------------------------  83 year old female with past medical history as below who presents with diarrhea.  The patient reports that over the last several months, she has had ongoing, recurrent episodes of painful abdominal pain followed by solid and loose stools.  The stools occasionally blood-tinged.  She had an episode last week that seemed to resolve over 24 hours so she did not seek medical treatment.  She returns today due to ongoing cramp-like, severe abdominal pain.  She has had associated loose stools. Her abdominal pain is aching, cramp like, worse before having a BM then improves   Initial Impression / Assessment and Plan / ED Course  I have reviewed the triage vital signs and the nursing notes.  Pertinent labs & imaging results that were available during my care of the patient were reviewed by me and considered in my medical decision making (see chart for details).  83 year old female here with intermittent, cramp-like abdominal pain.  Lab work is overall very reassuring with normal white blood cell count.  CT abdomen and pelvis shows colitis without obstruction or perforation.  Patient does report intermittent blood-tinged diarrhea but this is been ongoing for several  months and her hemoglobin is actually above its baseline.  She is not on blood thinners.  Discussed case and care with the patient and daughter in detail.  The patient feels improved in the ED, is tolerating p.o., and would like to attempt outpatient management.  I feel this is reasonable.  Given her recurrent colitis and reported change in stool caliber, I am concerned about possible underlying colonic abnormality.  I discussed the need for close outpatient follow-up for stool studies as well as referral for colonoscopy once colitis is resolved.  Normal white count, no recent antibiotic use to suggest C. difficile.  Problem  Blood in The Stool  Chronic Constipation  Incidental lung nodule-7 mm right middle lobe subpleural nodule     Wt Readings from Last 3 Encounters:  06/03/17 148 lb 14.4 oz (67.5 kg)  05/22/17 149 lb (67.6 kg)  05/20/17 140 lb (63.5 kg)   BP Readings from Last 3 Encounters:  06/03/17 123/74  05/22/17 112/66  05/21/17 118/64   Pulse Readings from Last 3 Encounters:  06/03/17 64  05/22/17 (!) 52  05/21/17 66   BMI Readings from Last 3 Encounters:  06/03/17 25.76 kg/m  05/22/17 25.78 kg/m  05/20/17 24.03 kg/m     Patient Care Team    Relationship Specialty Notifications Start End  [name], DO PCP - General Family Medicine  02/20/16   Elsie Saas, MD Consulting Physician Orthopedic Surgery  04/19/15   Foye Spurling, MD Consulting Physician Endocrinology  02/21/16   Hurman Horn, MD Consulting Physician Ophthalmology  02/21/16   Harriett Sine, MD Consulting Physician Dermatology  03/14/16   Angelia Mould, MD Consulting Physician Vascular Surgery  04/11/16   [name], MD Consulting Physician Orthopedic Surgery  05/20/16      Patient Active Problem List   Diagnosis Date Noted  . Urgency incontinence 05/24/2016    Priority: High  . h/o Hyperlipidemia, mild 02/24/2016    Priority: High  . Vitamin D deficiency 02/22/2016     Priority: High  . ADD (attention deficit disorder) 02/19/2011    Priority: High  . h/o low B12- on supp 02/24/2016    Priority: Medium  . thyroid disease- at time did not warrant txmnt 02/19/2011    Priority: Medium  . Patient has healthcare proxy and living will 07/20/2016    Priority: Low  . OA (osteoarthritis) of knee 04/15/2016    Priority: Low  . Status post hysterectomy  02/21/2016    Priority: Low  . Blood in the stool 06/03/2017  . Chronic constipation 06/03/2017  . Incidental lung nodule-7 mm right middle lobe subpleural nodule 06/03/2017  . Urinary incontinence 05/23/2017  . Preoperative general physical examination 07/20/2016  . Chronic pain of left knee 04/11/2016  . Senile solar keratosis 03/14/2016  . Peripheral edema- bilateral lower extremities 03/14/2016  . ETD- Sx of L ear 03/14/2016  . Gen chronic Fatigue 02/24/2016  . Decreased hearing 02/19/2011    Past Medical history, Surgical history, Family history, Social history, Allergies and Medications have been entered into the medical record, reviewed and changed as needed.    Current Meds  Medication Sig  . acetaminophen (TYLENOL) 500 MG tablet Take 1,000 mg by mouth  every 6 (six) hours as needed for mild pain.  Marland Kitchen aspirin EC 81 MG tablet Take 81 mg by mouth daily.  . Cholecalciferol (VITAMIN D PO) Take 1 capsule daily by mouth.  . oxybutynin (DITROPAN-XL) 10 MG 24 hr tablet Take 1 tablet by mouth daily.  . Polyethylene Glycol 3350 (MIRALAX PO) Take daily by mouth.    Allergies:  Allergies  Allergen Reactions  . Adhesive [Tape] Other (See Comments)    SKIN TEARS PATIENT TOLERATES PAPER TAPE  . Food     Patient is a vegetarian (does eat FISH)     Review of Systems: General:   Denies fever, chills, unexplained weight loss.  Optho/Auditory:   Denies visual changes, blurred vision/LOV Respiratory:   Denies wheeze, DOE more than baseline levels.  Cardiovascular:   Denies chest pain, palpitations, new  onset peripheral edema  Gastrointestinal:   Denies nausea, vomiting, diarrhea, abd pain.  Genitourinary: Denies dysuria, freq/ urgency, flank pain or discharge from genitals.  Endocrine:     Denies hot or cold intolerance, polyuria, polydipsia. Musculoskeletal:   Denies unexplained myalgias, joint swelling, unexplained arthralgias, gait problems.  Skin:  Denies new onset rash, suspicious lesions Neurological:     Denies dizziness, unexplained weakness, numbness  Psychiatric/Behavioral:   Denies mood changes, suicidal or homicidal ideations, hallucinations    Objective:   Blood pressure 123/74, pulse 64, height 5' 3.75" (1.619 m), weight 148 lb 14.4 oz (67.5 kg). Body mass index is 25.76 kg/m. General:  Well Developed, well nourished, appropriate for stated age.  Neuro:  Alert and oriented,  extra-ocular muscles intact  HEENT:  Normocephalic, atraumatic, neck supple, no carotid bruits appreciated  Skin:  no gross rash, warm, pink. Cardiac:  RRR, S1 S2 Respiratory:  ECTA B/L and A/P, Not using accessory muscles, speaking in full sentences- unlabored. Abd: No guarding rigidity rebound.  Mild tenderness to deep palpation epigastric region only.  Bowel sounds x4. Vascular:  Ext warm, no cyanosis apprec.; cap RF less 2 sec. Psych:  No HI/SI, judgement and insight good, Euthymic mood. Full Affect.

## 2017-06-03 NOTE — Patient Instructions (Signed)
In 2 days please follow-up with GI as scheduled.   Make sure you mention to GI your epigastric pain you are having as well.  In 6-12 months we will do a low-dose CT scan to follow-up on that 7 mm lung nodule in your right middle lobe.  -Continue the Flagyl antibiotics until gone.

## 2017-06-05 ENCOUNTER — Encounter: Payer: Self-pay | Admitting: Nurse Practitioner

## 2017-06-05 ENCOUNTER — Ambulatory Visit (INDEPENDENT_AMBULATORY_CARE_PROVIDER_SITE_OTHER): Payer: Medicare Other | Admitting: Nurse Practitioner

## 2017-06-05 VITALS — Ht 64.0 in | Wt 151.4 lb

## 2017-06-05 DIAGNOSIS — R933 Abnormal findings on diagnostic imaging of other parts of digestive tract: Secondary | ICD-10-CM | POA: Diagnosis not present

## 2017-06-05 DIAGNOSIS — K219 Gastro-esophageal reflux disease without esophagitis: Secondary | ICD-10-CM | POA: Diagnosis not present

## 2017-06-05 DIAGNOSIS — K625 Hemorrhage of anus and rectum: Secondary | ICD-10-CM | POA: Diagnosis not present

## 2017-06-05 MED ORDER — OMEPRAZOLE 20 MG PO CPDR: 20.0000 mg | DELAYED_RELEASE_CAPSULE | ORAL | 3 refills | Status: DC

## 2017-06-05 MED ORDER — NA SULFATE-K SULFATE-MG SULF 17.5-3.13-1.6 GM/177ML PO SOLN: ORAL | 0 refills | Status: DC
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
## 2017-06-05 NOTE — Patient Instructions (Addendum)
If you are age 81 or older, your body mass index should be between 23-30. Your Body mass index is 25.98 kg/m. If this is out of the aforementioned range listed, please consider follow up with your Primary Care Provider.  If you are age 35 or younger, your body mass index should be between 19-25. Your Body mass index is 25.98 kg/m. If this is out of the aformentioned range listed, please consider follow up with your Primary Care Provider.   You have been scheduled for a colonoscopy. Please follow written instructions given to you at your visit today.  Please pick up your prep supplies at the pharmacy within the next 1-3 days. If you use inhalers (even only as needed), please bring them with you on the day of your procedure. Your physician has requested that you go to www.startemmi.com and enter the access code given to you at your visit today. This web site gives a general overview about your procedure. However, you should still follow specific instructions given to you by our office regarding your preparation for the procedure.  We have sent the following medications to your pharmacy for you to pick up at your convenience: Suprep Prilosec 20 mg  Thank you for choosing me and [facility].   Tye Savoy, NP

## 2017-06-08 ENCOUNTER — Encounter: Payer: Self-pay | Admitting: Nurse Practitioner

## 2017-06-09 NOTE — Progress Notes (Signed)
Agree with assessment and plan as outlined.  

## 2017-06-10 ENCOUNTER — Ambulatory Visit (AMBULATORY_SURGERY_CENTER): Payer: Medicare Other | Admitting: Gastroenterology

## 2017-06-10 ENCOUNTER — Other Ambulatory Visit: Payer: Self-pay

## 2017-06-10 ENCOUNTER — Encounter: Payer: Self-pay | Admitting: Gastroenterology

## 2017-06-10 VITALS — BP 135/50 | HR 58 | Temp 97.0°F | Resp 12 | Ht 64.0 in | Wt 151.0 lb

## 2017-06-10 DIAGNOSIS — R933 Abnormal findings on diagnostic imaging of other parts of digestive tract: Secondary | ICD-10-CM | POA: Diagnosis not present

## 2017-06-10 DIAGNOSIS — R935 Abnormal findings on diagnostic imaging of other abdominal regions, including retroperitoneum: Secondary | ICD-10-CM | POA: Diagnosis not present

## 2017-06-10 DIAGNOSIS — K625 Hemorrhage of anus and rectum: Secondary | ICD-10-CM | POA: Diagnosis not present

## 2017-06-10 DIAGNOSIS — D123 Benign neoplasm of transverse colon: Secondary | ICD-10-CM

## 2017-06-10 DIAGNOSIS — E039 Hypothyroidism, unspecified: Secondary | ICD-10-CM | POA: Diagnosis not present

## 2017-06-10 DIAGNOSIS — K633 Ulcer of intestine: Secondary | ICD-10-CM | POA: Diagnosis not present

## 2017-06-10 MED ORDER — SODIUM CHLORIDE 0.9 % IV SOLN: 500.0000 mL | INTRAVENOUS | Status: DC

## 2017-06-10 NOTE — Progress Notes (Signed)
Pt's states no medical or surgical changes since previsit or office visit. 

## 2017-06-10 NOTE — Progress Notes (Signed)
Called to room to assist during endoscopic procedure.  Patient ID and intended procedure confirmed with present staff. Received instructions for my participation in the procedure from the performing physician.  

## 2017-06-10 NOTE — Op Note (Signed)
[place] Patient Name: Cheryl Martin Procedure Date: 06/10/2017 11:01 AM MRN: [id number] Endoscopist: Remo Lipps P. Armbruster MD, MD Age: 83 Referring MD:  Date of Birth: 1931/06/14 Gender: Female Account #: [id number] Procedure:                Colonoscopy Indications:              Abnormal CT of the GI tract (thickening of splenic                            flexure), history of abdominal pain / diarrhea                            associated with CT changes, now improved following                            course with antibiotics Medicines:                Monitored Anesthesia Care Procedure:                Pre-Anesthesia Assessment:                           - Prior to the procedure, a History and Physical                            was performed, and patient medications and                            allergies were reviewed. The patient's tolerance of                            previous anesthesia was also reviewed. The risks                            and benefits of the procedure and the sedation                            options and risks were discussed with the patient.                            All questions were answered, and informed consent                            was obtained. Prior Anticoagulants: The patient has                            taken no previous anticoagulant or antiplatelet                            agents. ASA Grade Assessment: II - A patient with                            mild systemic disease. After reviewing the risks  and benefits, the patient was deemed in                            satisfactory condition to undergo the procedure.                           After obtaining informed consent, the colonoscope                            was passed under direct vision. Throughout the                            procedure, the patient's blood pressure, pulse, and                            oxygen saturations were  monitored continuously. The                            Model PCF-H190DL [id number]) scope was introduced                            through the anus and advanced to the the terminal                            ileum, with identification of the appendiceal                            orifice and IC valve. The colonoscopy was performed                            without difficulty. The patient tolerated the                            procedure well. The quality of the bowel                            preparation was adequate. The terminal ileum,                            ileocecal valve, appendiceal orifice, and rectum                            were photographed. Scope In: 11:12:04 AM Scope Out: 11:28:05 AM Scope Withdrawal Time: 0 hours 11 minutes 51 seconds  Total Procedure Duration: 0 hours 16 minutes 1 second  Findings:                 The perianal and digital rectal examinations were                            normal.                           The terminal ileum appeared normal.  A 3 mm polyp was found in the transverse colon. The                            polyp was flat. The polyp was removed with a cold                            biopsy forceps. Resection and retrieval were                            complete.                           Patchy mild inflammation characterized by erosions                            and erythema was found at the splenic flexure and                            in the distal transverse colon. Biopsies were taken                            with a cold forceps for histology.                           Many medium-mouthed diverticula were found in the                            sigmoid colon.                           The exam was otherwise without abnormality on                            direct and retroflexion views. Complications:            No immediate complications. Estimated blood loss:                             Minimal. Estimated Blood Loss:     Estimated blood loss was minimal. Impression:               - The examined portion of the ileum was normal.                           - One 3 mm polyp in the transverse colon, removed                            with a cold biopsy forceps. Resected and retrieved.                           - Patchy mild inflammation was found at the splenic                            flexure and in the distal transverse colon.  Biopsied.                           - Diverticulosis in the sigmoid colon.                           - The examination was otherwise normal on direct                            and retroflexion views.                           Suspect inflammatory changes of the splenic flexure                            / transverse colon are likely due to self limited                            infection, no evidence of IBD or colon mass.                            Patient's symptoms have improved / resolved. Recommendation:           - Patient has a contact number available for                            emergencies. The signs and symptoms of potential                            delayed complications were discussed with the                            patient. Return to normal activities tomorrow.                            Written discharge instructions were provided to the                            patient.                           - Resume previous diet.                           - Continue present medications.                           - Await pathology results.                           - No further surveillance colonoscopy is likely                            warranted given the patient's age Cheryl Martin. Armbruster MD, MD 06/10/2017 11:34:17 AM This report has been signed electronically.

## 2017-06-10 NOTE — Patient Instructions (Signed)
   Information on polyps & diverticulosis  given to you today  Await pathology results on polyp removed today and on biopsies done today   YOU HAD AN ENDOSCOPIC PROCEDURE TODAY AT [facility]:   Refer to the procedure report that was given to you for any specific questions about what was found during the examination.  If the procedure report does not answer your questions, please call your gastroenterologist to clarify.  If you requested that your care partner not be given the details of your procedure findings, then the procedure report has been included in a sealed envelope for you to review at your convenience later.  YOU SHOULD EXPECT: Some feelings of bloating in the abdomen. Passage of more gas than usual.  Walking can help get rid of the air that was put into your GI tract during the procedure and reduce the bloating. If you had a lower endoscopy (such as a colonoscopy or flexible sigmoidoscopy) you may notice spotting of blood in your stool or on the toilet paper. If you underwent a bowel prep for your procedure, you may not have a normal bowel movement for a few days.  Please Note:  You might notice some irritation and congestion in your nose or some drainage.  This is from the oxygen used during your procedure.  There is no need for concern and it should clear up in a day or so.  SYMPTOMS TO REPORT IMMEDIATELY:   Following lower endoscopy (colonoscopy or flexible sigmoidoscopy):  Excessive amounts of blood in the stool  Significant tenderness or worsening of abdominal pains  Swelling of the abdomen that is new, acute  Fever of 100F or higher    For urgent or emergent issues, a gastroenterologist can be reached at any hour by calling [phone].   DIET:  We do recommend a small meal at first, but then you may proceed to your regular diet.  Drink plenty of fluids but you should avoid alcoholic beverages for 24 hours.  ACTIVITY:  You should plan to take it  easy for the rest of today and you should NOT DRIVE or use heavy machinery until tomorrow (because of the sedation medicines used during the test).    FOLLOW UP: Our staff will call the number listed on your records the next business day following your procedure to check on you and address any questions or concerns that you may have regarding the information given to you following your procedure. If we do not reach you, we will leave a message.  However, if you are feeling well and you are not experiencing any problems, there is no need to return our call.  We will assume that you have returned to your regular daily activities without incident.  If any biopsies were taken you will be contacted by phone or by letter within the next 1-3 weeks.  Please call us at [phone] if you have not heard about the biopsies in 3 weeks.    SIGNATURES/CONFIDENTIALITY: You and/or your care partner have signed paperwork which will be entered into your electronic medical record.  These signatures attest to the fact that that the information above on your After Visit Summary has been reviewed and is understood.  Full responsibility of the confidentiality of this discharge information lies with you and/or your care-partner.

## 2017-06-10 NOTE — Progress Notes (Signed)
Report given to PACU, vss 

## 2017-06-11 ENCOUNTER — Telehealth: Payer: Self-pay | Admitting: *Deleted

## 2017-06-11 NOTE — Telephone Encounter (Signed)
  Follow up Call-  Call back number 06/10/2017  Post procedure Call Back phone  # [phone]  Permission to leave phone message Yes  Some recent data might be hidden     Patient questions:  Do you have a fever, pain , or abdominal swelling? No. Pain Score  0 *  Have you tolerated food without any problems? Yes.    Have you been able to return to your normal activities? Yes.    Do you have any questions about your discharge instructions: Diet   No. Medications  No. Follow up visit  No.  Do you have questions or concerns about your Care? No.  Actions: * If pain score is 4 or above: No action needed, pain <4.

## 2017-06-19 ENCOUNTER — Encounter: Payer: Self-pay | Admitting: Gastroenterology

## 2017-06-23 DIAGNOSIS — Z961 Presence of intraocular lens: Secondary | ICD-10-CM | POA: Diagnosis not present

## 2017-06-23 DIAGNOSIS — H35371 Puckering of macula, right eye: Secondary | ICD-10-CM | POA: Diagnosis not present

## 2017-06-23 DIAGNOSIS — H1859 Other hereditary corneal dystrophies: Secondary | ICD-10-CM | POA: Diagnosis not present

## 2017-06-23 DIAGNOSIS — H04123 Dry eye syndrome of bilateral lacrimal glands: Secondary | ICD-10-CM | POA: Diagnosis not present

## 2017-06-23 DIAGNOSIS — H52213 Irregular astigmatism, bilateral: Secondary | ICD-10-CM | POA: Diagnosis not present

## 2017-06-24 ENCOUNTER — Telehealth: Payer: Self-pay | Admitting: Gastroenterology

## 2017-06-24 NOTE — Telephone Encounter (Signed)
Patient wanting to know when she should hear back about path results from colon on 11.20.18

## 2017-06-24 NOTE — Telephone Encounter (Signed)
Let patient know about letter mailed out yesterday.

## 2017-07-25 ENCOUNTER — Encounter: Payer: Self-pay | Admitting: Gastroenterology

## 2017-07-25 ENCOUNTER — Ambulatory Visit (INDEPENDENT_AMBULATORY_CARE_PROVIDER_SITE_OTHER): Payer: Medicare Other | Admitting: Gastroenterology

## 2017-07-25 VITALS — BP 114/60 | HR 69 | Ht 64.0 in | Wt 151.0 lb

## 2017-07-25 DIAGNOSIS — R1310 Dysphagia, unspecified: Secondary | ICD-10-CM | POA: Diagnosis not present

## 2017-07-25 DIAGNOSIS — R499 Unspecified voice and resonance disorder: Secondary | ICD-10-CM

## 2017-07-25 DIAGNOSIS — K219 Gastro-esophageal reflux disease without esophagitis: Secondary | ICD-10-CM | POA: Diagnosis not present

## 2017-07-25 DIAGNOSIS — Z8719 Personal history of other diseases of the digestive system: Secondary | ICD-10-CM

## 2017-07-25 MED ORDER — OMEPRAZOLE 20 MG PO CPDR: 20.0000 mg | DELAYED_RELEASE_CAPSULE | Freq: Two times a day (BID) | ORAL | 1 refills | Status: DC
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
## 2017-07-25 NOTE — Patient Instructions (Addendum)
If you are age 82 or older, your body mass index should be between 23-30. Your Body mass index is 25.92 kg/m. If this is out of the aforementioned range listed, please consider follow up with your Primary Care Provider.  If you are age 58 or younger, your body mass index should be between 19-25. Your Body mass index is 25.92 kg/m. If this is out of the aformentioned range listed, please consider follow up with your Primary Care Provider.   You have been scheduled for an endoscopy. Please follow written instructions given to you at your visit today. If you use inhalers (even only as needed), please bring them with you on the day of your procedure. Your physician has requested that you go to www.startemmi.com and enter the access code given to you at your visit today. This web site gives a general overview about your procedure. However, you should still follow specific instructions given to you by our office regarding your preparation for the procedure.  We have given you a printed prescription to take with you today for: Prilosec 20mg , twice a day  Thank you for entrusting me with your care and for [employer], Dr. [place] Cellar

## 2017-07-30 ENCOUNTER — Encounter: Payer: Self-pay | Admitting: Gastroenterology

## 2017-07-30 ENCOUNTER — Ambulatory Visit (AMBULATORY_SURGERY_CENTER): Payer: Medicare Other | Admitting: Gastroenterology

## 2017-07-30 ENCOUNTER — Other Ambulatory Visit: Payer: Self-pay

## 2017-07-30 VITALS — BP 144/64 | HR 58 | Temp 96.6°F | Resp 11 | Ht 64.0 in | Wt 151.0 lb

## 2017-07-30 DIAGNOSIS — K219 Gastro-esophageal reflux disease without esophagitis: Secondary | ICD-10-CM

## 2017-07-30 DIAGNOSIS — R1310 Dysphagia, unspecified: Secondary | ICD-10-CM

## 2017-07-30 DIAGNOSIS — K222 Esophageal obstruction: Secondary | ICD-10-CM

## 2017-07-30 MED ORDER — SODIUM CHLORIDE 0.9 % IV SOLN: 500.0000 mL | Freq: Once | INTRAVENOUS | Status: DC

## 2017-07-30 NOTE — Progress Notes (Signed)
Pt's states no medical or surgical changes since previsit or office visit.Pt's states no medical or surgical changes since previsit or office visit.Pt states no allergy to soy or eggs.

## 2017-07-30 NOTE — Patient Instructions (Signed)
**  Handouts given on hiatal hernia and post dilation diet** YOU HAD AN ENDOSCOPIC PROCEDURE TODAY AT THE [facility]:   Refer to the procedure report that was given to you for any specific questions about what was found during the examination.  If the procedure report does not answer your questions, please call your gastroenterologist to clarify.  If you requested that your care partner not be given the details of your procedure findings, then the procedure report has been included in a sealed envelope for you to review at your convenience later.  YOU SHOULD EXPECT: Some feelings of bloating in the abdomen. Passage of more gas than usual.  Walking can help get rid of the air that was put into your GI tract during the procedure and reduce the bloating. If you had a lower endoscopy (such as a colonoscopy or flexible sigmoidoscopy) you may notice spotting of blood in your stool or on the toilet paper. If you underwent a bowel prep for your procedure, you may not have a normal bowel movement for a few days.  Please Note:  You might notice some irritation and congestion in your nose or some drainage.  This is from the oxygen used during your procedure.  There is no need for concern and it should clear up in a day or so.  SYMPTOMS TO REPORT IMMEDIATELY:    Following upper endoscopy (EGD)  Vomiting of blood or coffee ground material  New chest pain or pain under the shoulder blades  Painful or persistently difficult swallowing  New shortness of breath  Fever of 100F or higher  Black, tarry-looking stools  For urgent or emergent issues, a gastroenterologist can be reached at any hour by calling [phone].   DIET:  We do recommend a small meal at first, but then you may proceed to your regular diet.  Drink plenty of fluids but you should avoid alcoholic beverages for 24 hours.  ACTIVITY:  You should plan to take it easy for the rest of today and you should NOT DRIVE or use heavy  machinery until tomorrow (because of the sedation medicines used during the test).    FOLLOW UP: Our staff will call the number listed on your records the next business day following your procedure to check on you and address any questions or concerns that you may have regarding the information given to you following your procedure. If we do not reach you, we will leave a message.  However, if you are feeling well and you are not experiencing any problems, there is no need to return our call.  We will assume that you have returned to your regular daily activities without incident.  If any biopsies were taken you will be contacted by phone or by letter within the next 1-3 weeks.  Please call us at [phone] if you have not heard about the biopsies in 3 weeks.    SIGNATURES/CONFIDENTIALITY: You and/or your care partner have signed paperwork which will be entered into your electronic medical record.  These signatures attest to the fact that that the information above on your After Visit Summary has been reviewed and is understood.  Full responsibility of the confidentiality of this discharge information lies with you and/or your care-partner.

## 2017-07-30 NOTE — Op Note (Signed)
[facility] Patient Name: Cheryl Martin Procedure Date: 07/30/2017 7:53 AM MRN: [id number] Endoscopist: Remo Lipps P. Ernie Sagrero MD, MD Age: 84 Referring MD:  Date of Birth: [birth year] Gender: Female Account #: [id number] Procedure:                Upper GI endoscopy Indications:              Dysphagia, Heartburn, voice changes Medicines:                Monitored Anesthesia Care Procedure:                Pre-Anesthesia Assessment:                           - Prior to the procedure, a History and Physical                            was performed, and patient medications and                            allergies were reviewed. The patient's tolerance of                            previous anesthesia was also reviewed. The risks                            and benefits of the procedure and the sedation                            options and risks were discussed with the patient.                            All questions were answered, and informed consent                            was obtained. Prior Anticoagulants: The patient has                            taken no previous anticoagulant or antiplatelet                            agents. ASA Grade Assessment: II - A patient with                            mild systemic disease. After reviewing the risks                            and benefits, the patient was deemed in                            satisfactory condition to undergo the procedure.                           After obtaining informed consent, the endoscope was  passed under direct vision. Throughout the                            procedure, the patient's blood pressure, pulse, and                            oxygen saturations were monitored continuously. The                            Endoscope was introduced through the mouth, and                            advanced to the second part of duodenum. The upper                            GI  endoscopy was accomplished without difficulty.                            The patient tolerated the procedure well. Scope In: Scope Out: Findings:                 Esophagogastric landmarks were identified: the                            Z-line was found at 32 cm, the gastroesophageal                            junction was found at 32 cm and the upper extent of                            the gastric folds was found at 40 cm from the                            incisors.                           An 8 cm hiatal hernia was present.                           One mild benign-appearing, intrinsic stenosis was                            found 32 cm from the incisors. This measured less                            than one cm (in length). A TTS dilator was passed                            through the scope. Dilation with an 18-19-20 mm                            balloon dilator was performed to 18 mm and 19 mm  after which a small mucosal wrent was noted. The                            patient hiccuped during dilation, infection of the                            hernia sac post dilation showed a superficial wrent                            within the hernia sac with some oozing. The                            bleeding stopped on its own with observation, and                            while the wrent appeared superficial I elected to                            close it with two hemostatic clips. There was no                            bleeding at the end of the procedure.                           The esophagus was tortous. The exam of the                            esophagus was otherwise normal. No inflammatory                            changes noted.                           The entire examined stomach was normal.                           The duodenal bulb and second portion of the                            duodenum were normal. Complications:            No  immediate complications. Estimated blood loss:                            Minimal. Estimated Blood Loss:     Estimated blood loss was minimal. Impression:               - Esophagogastric landmarks identified.                           - 8 cm hiatal hernia.                           - Tortous esophagus                           -  Benign-appearing esophageal stenosis at the GEJ.                            Dilated to 73mm with good result. Superficial                            mucosal wrent from balloon tip within hernia sac as                            outlined above, did not appear significant but 2                            clips were placed.                           - Normal stomach.                           - Normal duodenal bulb and second portion of the                            duodenum.                           Suspect dysphagia due to mild stenosis in the                            setting of very large hiatal hernia, while                            dysmotility is also possible. No obvious reflux                            esophagitis in light of voice changes. Recommendation:           - Patient has a contact number available for                            emergencies. The signs and symptoms of potential                            delayed complications were discussed with the                            patient. Return to normal activities tomorrow.                            Written discharge instructions were provided to the                            patient.                           - Resume previous diet.                           -  Continue present medications.                           - Await course following dilation                           - Continue trial of omeprazole twice daily                           - If voice changes persist, recommend referral to                            ENT for further evaluation. Remo Lipps P. Victor Langenbach MD, MD 07/30/2017 8:30:58  AM This report has been signed electronically.

## 2017-07-30 NOTE — Progress Notes (Signed)
A/ox3 pleased with MAC, report to RN 

## 2017-07-30 NOTE — Progress Notes (Signed)
Called to room to assist during endoscopic procedure.  Patient ID and intended procedure confirmed with present staff. Received instructions for my participation in the procedure from the performing physician.  

## 2017-07-31 ENCOUNTER — Telehealth: Payer: Self-pay

## 2017-07-31 ENCOUNTER — Telehealth: Payer: Self-pay | Admitting: *Deleted

## 2017-07-31 NOTE — Telephone Encounter (Signed)
Left message

## 2017-07-31 NOTE — Telephone Encounter (Signed)
  Follow up Call-  Call back number 06/10/2017  Post procedure Call Back phone  # [phone]  Permission to leave phone message Yes  Some recent data might be hidden     Patient questions:  Do you have a fever, pain , or abdominal swelling? No. Pain Score  0 *  Have you tolerated food without any problems? Yes.    Have you been able to return to your normal activities? Yes.    Do you have any questions about your discharge instructions: Diet   No. Medications  No. Follow up visit  Yes.  answered  Do you have questions or concerns about your Care? No.  Actions: * If pain score is 4 or above: No action needed, pain <4.

## 2017-08-01 DIAGNOSIS — Z961 Presence of intraocular lens: Secondary | ICD-10-CM | POA: Diagnosis not present

## 2017-08-01 DIAGNOSIS — H35371 Puckering of macula, right eye: Secondary | ICD-10-CM | POA: Diagnosis not present

## 2017-08-01 DIAGNOSIS — R0683 Snoring: Secondary | ICD-10-CM | POA: Diagnosis not present

## 2017-08-01 DIAGNOSIS — H35372 Puckering of macula, left eye: Secondary | ICD-10-CM | POA: Diagnosis not present

## 2017-08-06 ENCOUNTER — Telehealth: Payer: Self-pay | Admitting: Family Medicine

## 2017-08-06 NOTE — Telephone Encounter (Signed)
Patient called states she had Endo & Colonoscopy w/ Dr. Havery Moros who suggested PCP refer her to a vocal chord doctor for throat issue---  ---Patient also says had several visit to eye doctor who states she needed a sleep study for possible Sleep Apnea dx. --Forwarding message to provider for review of office notes from both providers & advised medical assistant or provider would contact patient .   -glh

## 2017-08-07 NOTE — Telephone Encounter (Signed)
Please advise if referrals are appropriate or if patient needs an office visit first.  Thank you. MPulliam, CMA/RT(R)

## 2017-08-12 NOTE — Telephone Encounter (Signed)
Cheryl Martin,  Tell her Dr. Havery Moros can refer her to another doctor if he feels that is necessary as well as her eye doctor can refer her to one as well otherwise, I will need to see the patient and discuss her symptoms and why she may need these referrals.

## 2017-08-13 NOTE — Telephone Encounter (Signed)
Called patient - Appt made for the patient to come in and see Dr. Raliegh Scarlet. MPulliam, CMA/RT(R)

## 2017-08-18 ENCOUNTER — Encounter: Payer: Self-pay | Admitting: Family Medicine

## 2017-08-18 ENCOUNTER — Ambulatory Visit (INDEPENDENT_AMBULATORY_CARE_PROVIDER_SITE_OTHER): Payer: Medicare Other | Admitting: Family Medicine

## 2017-08-18 VITALS — BP 114/73 | HR 61 | Ht 64.0 in | Wt 147.7 lb

## 2017-08-18 DIAGNOSIS — R911 Solitary pulmonary nodule: Secondary | ICD-10-CM | POA: Diagnosis not present

## 2017-08-18 DIAGNOSIS — R490 Dysphonia: Secondary | ICD-10-CM | POA: Insufficient documentation

## 2017-08-18 DIAGNOSIS — R918 Other nonspecific abnormal finding of lung field: Secondary | ICD-10-CM | POA: Diagnosis not present

## 2017-08-18 NOTE — Patient Instructions (Signed)
Patient will follow-up in February as instructed for chronic care.  We referred her to ear nose and throat for her hoarseness.  Await further input from them.    Hoarseness  Hoarseness is any abnormal change in your voice.Hoarseness can make it difficult to speak. Your voice may sound raspy, breathy, or strained. Hoarseness is caused by a problem with the vocal cords. The vocal cords are two bands of tissue inside your voice box (larynx). When you speak, your vocal cords move back and forth to create sound. The surfaces of your vocal cords need to be smooth for your voice to sound clear. Swelling or lumps on the vocal cords can cause hoarseness. Common causes of vocal cord problems include:  Upper airway infection.  A long-term cough.  Straining or overusing your voice.  Smoking.  Allergies.  Vocal cord growths.  Stomach acids that flow up from your stomach and irritate your vocal cords (gastroesophageal reflux).  Follow these instructions at home: Watch your condition for any changes. To ease any discomfort that you feel:  Rest your voice. Do not whisper. Whispering can cause muscle strain.  Do not speak in a loud or harsh voice that makes your hoarseness worse.  Do not use any tobacco products, including cigarettes, chewing tobacco, or electronic cigarettes. If you need help quitting, ask your health care provider.  Avoid secondhand smoke.  Do not eat foods that give you heartburn. Heartburn can make gastroesophageal reflux worse.  Do not drink coffee.  Do not drink alcohol.  Drink enough fluids to keep your urine clear or pale yellow.  Use a humidifier if the air in your home is dry.  Contact a health care provider if:  You have hoarseness that lasts longer than 3 weeks.  You almost lose or completelylose your voice for longer than 3 days.  You have pain when you swallow or try to talk.  You feel a lump in your neck. Get help right away if:  You have  trouble swallowing.  You feel as though you are choking when you swallow.  You cough up blood or vomit blood.  You have trouble breathing. This information is not intended to replace advice given to you by your health care provider. Make sure you discuss any questions you have with your health care provider. Document Released: 06/21/2005 Document Revised: 12/14/2015 Document Reviewed: 06/29/2014 Elsevier Interactive Patient Education  [employer].

## 2017-08-18 NOTE — Progress Notes (Signed)
Impression and Recommendations:    1. Hoarseness of voice   2. Pulmonary nodule-incidentally found on CT abdomen pelvis from October 2018 7 mm right middle lobe subpleural nodule    3. Abnormal findings on diagnostic imaging of lung     1. Hoarseness of voice - Pt is here to follow up from recent endoscopy on 07-30-17.  Her findings were benign and it did not find reflux as an etiology for her hoarseness and voice changes.  She will be referred to ENT to further assess voice change.   2. Pulmonary nodule incidentally found on CT abd/pelvis which was done on 05/2017- Repeat CT abdomen/pelvis in 6-12 months after she has a h/o finding a pulmonary nodule incidentally.  Order placed and pt reminded this needs to be done  Abnormal findings found incidentally, will follow nodule per radiologist recommendations.  Eye- pt is requesting referral for pulmonology after her ophthalmologist recommended her to for potential oxygen therapy. STOP bang questionnaire was negative and she had only one positive result. We contacted via email Dr. Zadie Rhine to discuss future plan of care.  Will await response from him to further discuss oxygen treatment of her ocular condition  Follow up on 09-03-17 for chronic care and routine health management.   Orders Placed This Encounter  Procedures  . CT CHEST NODULE FOLLOW UP LOW DOSE W/O  . CT Chest Wo Contrast  . Ambulatory referral to ENT    Gross side effects, risk and benefits, and alternatives of medications and treatment plan in general discussed with patient.  Patient is aware that all medications have potential side effects and we are unable to predict every side effect or drug-drug interaction that may occur.   Patient will call with any questions prior to using medication if they have concerns.  Expresses verbal understanding and consents to current therapy and treatment regimen.  No barriers to understanding were identified.  Red flag symptoms and signs  discussed in detail.  Patient expressed understanding regarding what to do in case of emergency\urgent symptoms  Please see AVS handed out to patient at the end of our visit for further patient instructions/ counseling done pertaining to today's office visit.   Return for Follow-up as planned in the near future.  We will wait to hear from Dr. Zadie Rhine.    Note: This note was prepared with assistance of Dragon voice recognition software. Occasional wrong-word or sound-a-like substitutions may have occurred due to the inherent limitations of voice recognition software.  This document serves as a record of services personally performed by [name], DO. It was created on her behalf by Mayer Masker, a trained medical scribe. The creation of this record is based on the scribe's personal observations and the provider's statements to them.   I have reviewed the above medical documentation for accuracy and completeness and I concur.  [name] 08/18/17 1:04 PM  --------------------------------------------------------------------------------------------------------------------------------------------------------------------------------------------------------------------------------------------    Subjective:     HPI: BINA VEENSTRA is a 84 y.o. female who presents to [place] [facility] today for issues as discussed below. Pt is requesting a referral to several specialists regarding her other health problems. Pt is not exercising but she states she is busy throughout the day.  Knee: She states her left knee is improving after physical therapy. She has another appointment next month to follow up and address her condition.   Colonoscopy Pt had a colonoscopy 06-10-17 and they found a 65mm polyp that  was precancerous. She is UTD for colonoscopy and per GI, nothing else needs to be done in the future due to pt's age.  Endoscopy: Pt has problems with her voice  changing and hoarseness that has caused her to stop singing in her church choir, which is very distressing to patient and something she really enjoys and is an important part of her quality of life for her. She has been taking omeprazole OTC-patient has only been taking it once daily.  Denies that it is helping at all with her hoarseness.  Pt had an EGD done on 07-30-17 for dysphasia which showed a tortuous esophagus, but no erosive esophagitis or reflux damage.. She had no complications.  They did however dilate her esophagus they found a mild hernia but results were WNL.  Her dilated esophagus has improved her swallowing difficulties. Her stenosis was improved after esophagus was dilated.  He states her symptoms of voice change and hoarseness were not caused by reflux or irritation of the lining of esophagus.  Dr. Havery Moros referred her to PCP to address her hoarseness-which is why she is here today.  Abdomen- Pt had abd cat scan in Fall 2018 and an incidentaloma found in R middle lobe.   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 Dr. Deloria Lair, ophthalmology, has discussed with her a potential oxygen therapy for improving her ophthalmologic conditions-- he referred her to PCP -back to [place] to discuss this today.    Dr Zadie Rhine suggested that she possibly has obstructive sleep apnea and wanted me to assess her to see if it was appropriate to send her to pulmonology.    Pt does not think she has sleep apnea.     She states he discussed with her a potential referral to pulmonology to address this condition.   **  Stop bang questionnaire was performed with patient in the office today.  Her score was 1 due to her age over 55    Wt Readings from Last 3 Encounters:  08/18/17 147 lb 11.2 oz (67 kg)  07/30/17 151 lb (68.5 kg)  07/25/17 151 lb (68.5 kg)   BP Readings from Last 3 Encounters:  08/18/17 114/73  07/30/17 (!) 144/64  07/25/17  114/60   Pulse Readings from Last 3 Encounters:  08/18/17 61  07/30/17 (!) 58  07/25/17 69   BMI Readings from Last 3 Encounters:  08/18/17 25.35 kg/m  07/30/17 25.92 kg/m  07/25/17 25.92 kg/m     Patient Care Team    Relationship Specialty Notifications Start End  [name], DO PCP - General Family Medicine  02/20/16   Elsie Saas, MD Consulting Physician Orthopedic Surgery  04/19/15   Foye Spurling, MD Consulting Physician Endocrinology  02/21/16   Hurman Horn, MD Consulting Physician Ophthalmology  02/21/16   Harriett Sine, MD Consulting Physician Dermatology  03/14/16   Angelia Mould, MD Consulting Physician Vascular Surgery  04/11/16   [name], MD Consulting Physician Orthopedic Surgery  05/20/16      Patient Active Problem List   Diagnosis Date Noted  . Urgency incontinence 05/24/2016    Priority: High  . h/o Hyperlipidemia, mild 02/24/2016    Priority: High  . Vitamin D deficiency 02/22/2016    Priority: High  . ADD (attention deficit disorder) 02/19/2011    Priority: High  . h/o low B12- on supp 02/24/2016    Priority: Medium  . thyroid disease- at time did  not warrant txmnt 02/19/2011    Priority: Medium  . Patient has healthcare proxy and living will 07/20/2016    Priority: Low  . OA (osteoarthritis) of knee 04/15/2016    Priority: Low  . Status post hysterectomy  02/21/2016    Priority: Low  . Hoarseness of voice 08/18/2017  . Blood in the stool 06/03/2017  . Chronic constipation 06/03/2017  . Incidental lung nodule-7 mm right middle lobe subpleural nodule 06/03/2017  . Urinary incontinence 05/23/2017  . Preoperative general physical examination 07/20/2016  . Chronic pain of left knee 04/11/2016  . Senile solar keratosis 03/14/2016  . Peripheral edema- bilateral lower extremities 03/14/2016  . ETD- Sx of L ear 03/14/2016  . Gen chronic Fatigue 02/24/2016  . Decreased hearing 02/19/2011    Past Medical history,  Surgical history, Family history, Social history, Allergies and Medications have been entered into the medical record, reviewed and changed as needed.    Current Meds  Medication Sig  . aspirin EC 81 MG tablet Take 81 mg by mouth daily.  . Biotin 1000 MCG tablet Take 1,000 mcg daily by mouth.  . Cholecalciferol (VITAMIN D PO) Take 1 capsule daily by mouth.  . folic acid (FOLVITE) 1 MG tablet Take 1 mg daily by mouth.  Marland Kitchen GLUTAMINE PO Take daily by mouth.  . Multiple Vitamin (MULTIVITAMIN) tablet Take 1 tablet daily by mouth.  Marland Kitchen omeprazole (PRILOSEC) 20 MG capsule Take 1 capsule (20 mg total) by mouth 2 (two) times daily before a meal.  . OVER THE COUNTER MEDICATION Take by mouth at bedtime.  Marland Kitchen oxybutynin (DITROPAN-XL) 10 MG 24 hr tablet Take 1 tablet by mouth daily.  . Polyethylene Glycol 3350 (MIRALAX PO) Take daily by mouth.  . TURMERIC PO Take daily by mouth.  . vitamin C (ASCORBIC ACID) 500 MG tablet Take 500 mg daily by mouth.   Current Facility-Administered Medications for the 08/18/17 encounter (Office Visit) with [name], DO  Medication  . 0.9 %  sodium chloride infusion  . 0.9 %  sodium chloride infusion    Allergies:  Allergies  Allergen Reactions  . Adhesive [Tape] Other (See Comments)    SKIN TEARS PATIENT TOLERATES PAPER TAPE  . Food     Patient is a vegetarian (does eat FISH)     Review of Systems:  A fourteen system review of systems was performed and found to be positive as per HPI.   Objective:   Blood pressure 114/73, pulse 61, height 5\' 4"  (1.626 m), weight 147 lb 11.2 oz (67 kg), SpO2 99 %. Body mass index is 25.35 kg/m. General:  Well Developed, well nourished, appropriate for stated age.  Neuro:  Alert and oriented,  extra-ocular muscles intact  HEENT:  Normocephalic, atraumatic, neck supple, no carotid bruits appreciated  Skin:  no gross rash, warm, pink. Cardiac:  RRR, S1 S2 Respiratory:  ECTA B/L and A/P, Not using accessory muscles,  speaking in full sentences- unlabored. Vascular:  Ext warm, no cyanosis apprec.; cap RF less 2 sec. Psych:  No HI/SI, judgement and insight good, Euthymic mood. Full Affect.

## 2017-08-20 ENCOUNTER — Ambulatory Visit
Admission: RE | Admit: 2017-08-20 | Discharge: 2017-08-20 | Disposition: A | Discharge status: Home / Self Care | Payer: Medicare Other | Source: Ambulatory Visit | Attending: Family Medicine | Admitting: Family Medicine

## 2017-08-20 DIAGNOSIS — J383 Other diseases of vocal cords: Secondary | ICD-10-CM | POA: Diagnosis not present

## 2017-08-20 DIAGNOSIS — R918 Other nonspecific abnormal finding of lung field: Secondary | ICD-10-CM

## 2017-08-20 DIAGNOSIS — J387 Other diseases of larynx: Secondary | ICD-10-CM | POA: Insufficient documentation

## 2017-08-20 DIAGNOSIS — R911 Solitary pulmonary nodule: Secondary | ICD-10-CM

## 2017-08-20 DIAGNOSIS — R490 Dysphonia: Secondary | ICD-10-CM

## 2017-08-29 ENCOUNTER — Other Ambulatory Visit: Payer: Self-pay

## 2017-08-29 DIAGNOSIS — R911 Solitary pulmonary nodule: Secondary | ICD-10-CM

## 2017-09-03 ENCOUNTER — Encounter: Payer: Self-pay | Admitting: Family Medicine

## 2017-09-03 ENCOUNTER — Ambulatory Visit (INDEPENDENT_AMBULATORY_CARE_PROVIDER_SITE_OTHER): Payer: Medicare Other | Admitting: Family Medicine

## 2017-09-03 VITALS — BP 128/83 | HR 66 | Ht 64.0 in | Wt 148.8 lb

## 2017-09-03 DIAGNOSIS — Z90710 Acquired absence of both cervix and uterus: Secondary | ICD-10-CM

## 2017-09-03 DIAGNOSIS — N3941 Urge incontinence: Secondary | ICD-10-CM | POA: Diagnosis not present

## 2017-09-03 DIAGNOSIS — R5382 Chronic fatigue, unspecified: Secondary | ICD-10-CM | POA: Diagnosis not present

## 2017-09-03 DIAGNOSIS — R490 Dysphonia: Secondary | ICD-10-CM

## 2017-09-03 DIAGNOSIS — K921 Melena: Secondary | ICD-10-CM | POA: Diagnosis not present

## 2017-09-03 DIAGNOSIS — R32 Unspecified urinary incontinence: Secondary | ICD-10-CM

## 2017-09-03 DIAGNOSIS — E559 Vitamin D deficiency, unspecified: Secondary | ICD-10-CM | POA: Diagnosis not present

## 2017-09-03 DIAGNOSIS — E785 Hyperlipidemia, unspecified: Secondary | ICD-10-CM

## 2017-09-03 DIAGNOSIS — E079 Disorder of thyroid, unspecified: Secondary | ICD-10-CM

## 2017-09-03 DIAGNOSIS — E538 Deficiency of other specified B group vitamins: Secondary | ICD-10-CM | POA: Diagnosis not present

## 2017-09-03 DIAGNOSIS — K5909 Other constipation: Secondary | ICD-10-CM

## 2017-09-03 DIAGNOSIS — J387 Other diseases of larynx: Secondary | ICD-10-CM | POA: Diagnosis not present

## 2017-09-03 DIAGNOSIS — Z801 Family history of malignant neoplasm of trachea, bronchus and lung: Secondary | ICD-10-CM | POA: Insufficient documentation

## 2017-09-03 DIAGNOSIS — Z800 Family history of malignant neoplasm of digestive organs: Secondary | ICD-10-CM

## 2017-09-03 NOTE — Progress Notes (Signed)
Impression and Recommendations:    1. Chronic fatigue   2. Vitamin D deficiency   3. h/o Hyperlipidemia, mild   4. Urgency incontinence   5. thyroid disease- at time did not warrant txmnt   6. h/o low B12- on supp   7. Status post hysterectomy    8. Chronic constipation   9. 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 Hoarseness of voice   12. Blood in the stool   13. Family history of lung cancer- brother   24. Family history of stomach cancer and colon     1. Osteoarthritis / Knee Surgery - During her yearly check up on her knee, the patient should ask Dr. Wynelle Link if he has any recommendations to improve her recuperation process.  Patient goal is to be able to kneel down to plant flowers.  - Follow recovery guidelines through surgery as prescribed.  2. GI / Constipation - Strongly advised the patient to exercise and consume adequate hydration encouraged to help her bowels to move.  - Reviewed at length that inadequate hydration is the biggest reason for constipation.  - Continue treating constipation with slippery elm bark, since the patient reports that it's working.  3. Hoarseness / Vocal Cords - Reviewed with the patient that during the scope, her vocal cords were seen as normal, with no mass or nodule, with mild bowing.    - Continue following recommendations prescribed by ENT.  - ENT recommended that the patient follow up as needed.  Advised the patient to follow up with ENT with any of her questions and concerns - she should call them up and describe that she still feels hoarse, ask if they recommend vocal therapy for her hoarseness, and anything else she can do to return to singing in the choir.  - Patient last saw them on 08/21/2017 - recommend that if she has continued hoarseness despite the treatment recommended by ENT, for reflux with the omeprazole twice daily, she should follow up with their office around March first.  Call them to  inquire about continued symptoms after one month.  - If the patient feels that her allergies are acting up, recommended performing a nasal rinse using her available tools.  Advised the patient to begin using her Neti pot.  Patient knows to use distilled water.  4. Lung Nodule - 08/20/2017 - stable nodule, but non contrast CT scan was recommended at 3-6 months. - She is due for follow-up on this around 11/18/2017, end of April. - If that nodule is stable, we will follow up again in several more months. - Will continue to monitor the patient closely.  5. Follow-Up - Fasting lab work was drawn on the patient today.  Kidney, liver, cholesterol, thyroid, vitamin D, B12, magnesium, phosphorous, CBC.   Orders Placed This Encounter  Procedures  . Comprehensive metabolic panel  . Lipid panel  . TSH  . T4, free  . VITAMIN D 25 Hydroxy (Vit-D Deficiency, Fractures)  . Phosphorus  . Magnesium    No orders of the defined types were placed in this encounter.   Gross side effects, risk and benefits, and alternatives of medications and treatment plan in general discussed with patient.  Patient is aware that all medications have potential side effects and we are unable to predict every side effect or drug-drug interaction that may occur.   Patient will call with any questions prior to using medication if they have concerns.  Expresses  verbal understanding and consents to current therapy and treatment regimen.  No barriers to understanding were identified.  Red flag symptoms and signs discussed in detail.  Patient expressed understanding regarding what to do in case of emergency\urgent symptoms  Please see AVS handed out to patient at the end of our visit for further patient instructions/ counseling done pertaining to today's office visit.   Return for 30mo- chronic f/up; sooner if needed.    Note: This note was prepared with assistance of Dragon voice recognition software. Occasional wrong-word or  sound-a-like substitutions may have occurred due to the inherent limitations of voice recognition software.   This document serves as a record of services personally performed by [name], DO. It was created on her behalf by Toni Amend, a trained medical scribe. The creation of this record is based on the scribe's personal observations and the provider's statements to them.   I have reviewed the above medical documentation for accuracy and completeness and I concur.  [name] 09/25/17 4:26 PM   -------------------------------------------------------------------------------------------------------------------------------------------------------------------   Subjective:     HPI: YARITZY HUSER is a 84 y.o. female who presents to [place] [facility] today for issues as discussed below.  Overall, the patient says "I feel really good."  Osteoarthritis / Knee Surgery She sees Dr. Wynelle Link tomorrow for her yearly checkup.  Her knee is doing well.  Every once in a while there are twinges, and "the skin doesn't feel like it's really skin."  She's still not able to kneel down yet to plant flowers, but she's getting closer.  GI / Constipation GI appointment was good - they found one precancerous polyp that they removed.  She notes that she had chronic constipation, and was told to take Miralax.  She feels that she needs to change to something else because she's getting "immune to it."  Her daughter started her on some slippery elm bark, which she cooks and takes at night.  She notes that this has been working really well.  Her bowels do get hard.  She has tried stool softeners in the past, and sometimes would take them once a day.  Stool softeners are "not enough to get her through."  Vocal Cords Patient notes concerns about her recent vocal cord scope.  Reviewed with the patient that her vocal cords were seen as normal, with no mass or nodule.  During  one of her appointments, no obvious changes in the esophagus caused by inflammation were noted.  She continues taking the Prilosec twice a day before meals, and notes that it "hasn't done a thing."  She is still feeling hoarse and hasn't gone back to choir yet, and notes that she misses it so much.  Notes that she does have a few allergies, and it could be related to that.  Hydration Due to her bladder incontinence, she doesn't like drinking a lot of water.  She does not want to wear Depends underwear because she does not want to feel wet.  She notes that she's fine whenever she's at home - she drinks plenty of water, but does not like to be trapped at home.  Vitamin D She continues taking Vitamin D once per day, but isn't sure of the dosage.  OBGYN Had a hysterectomy, but per patient, indicates that one ovary remains.  "I don't know what happened to the other one."  Notes that her last mammogram was about three years ago.  She says "it was so  painful that it was my last one."  Family History Updates During the appointment today, the patient note that her brother has colon cancer and stomach cancer, diagnosed about two years ago.  Her other brother had lung cancer.   Wt Readings from Last 3 Encounters:  09/03/17 148 lb 12.8 oz (67.5 kg)  08/18/17 147 lb 11.2 oz (67 kg)  07/30/17 151 lb (68.5 kg)   BP Readings from Last 3 Encounters:  09/03/17 128/83  08/18/17 114/73  07/30/17 (!) 144/64   Pulse Readings from Last 3 Encounters:  09/03/17 66  08/18/17 61  07/30/17 (!) 58   BMI Readings from Last 3 Encounters:  09/03/17 25.54 kg/m  08/18/17 25.35 kg/m  07/30/17 25.92 kg/m     Patient Care Team    Relationship Specialty Notifications Start End  [name], DO PCP - General Family Medicine  02/20/16   Elsie Saas, MD Consulting Physician Orthopedic Surgery  04/19/15   Foye Spurling, MD Consulting Physician Endocrinology  02/21/16   Hurman Horn, MD Consulting  Physician Ophthalmology  02/21/16   Harriett Sine, MD Consulting Physician Dermatology  03/14/16   Angelia Mould, MD Consulting Physician Vascular Surgery  04/11/16   [name], MD Consulting Physician Orthopedic Surgery  30/16/01   Jodi Marble, MD Consulting Physician Otolaryngology  09/03/17   Jolene Provost, PA-C Physician Assistant Physician Assistant  09/03/17    Comment: ENT     Patient Active Problem List   Diagnosis Date Noted  . Urgency incontinence 05/24/2016    Priority: High  . h/o Hyperlipidemia, mild 02/24/2016    Priority: High  . Vitamin D deficiency 02/22/2016    Priority: High  . ADD (attention deficit disorder) 02/19/2011    Priority: High  . h/o low B12- on supp 02/24/2016    Priority: Medium  . thyroid disease- at time did not warrant txmnt 02/19/2011    Priority: Medium  . Chronic constipation 06/03/2017    Priority: Low  . Patient has healthcare proxy and living will 07/20/2016    Priority: Low  . OA (osteoarthritis) of knee 04/15/2016    Priority: Low  . Status post hysterectomy  02/21/2016    Priority: Low  . Family history of lung cancer- brother 09/03/2017  . Family history of stomach cancer and colon 09/03/2017  . 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 Hoarseness of voice 08/18/2017  . Blood in the stool 06/03/2017  . Incidental lung nodule-7 mm right middle lobe subpleural nodule 06/03/2017  . Urinary incontinence 05/23/2017  . Preoperative general physical examination 07/20/2016  . Chronic pain of left knee 04/11/2016  . Senile solar keratosis 03/14/2016  . Peripheral edema- bilateral lower extremities 03/14/2016  . ETD- Sx of L ear 03/14/2016  . Gen chronic Fatigue 02/24/2016  . Decreased hearing 02/19/2011    Past Medical history, Surgical history, Family history, Social history, Allergies and Medications have been entered into the medical record, reviewed and changed as needed.    Current Meds  Medication Sig  . aspirin EC  81 MG tablet Take 81 mg by mouth daily.  . Biotin 1000 MCG tablet Take 1,000 mcg daily by mouth.  . Cholecalciferol (VITAMIN D PO) Take 1 capsule daily by mouth.  . folic acid (FOLVITE) 1 MG tablet Take 1 mg daily by mouth.  Marland Kitchen GLUTAMINE PO Take daily by mouth.  . Multiple Vitamin (MULTIVITAMIN) tablet Take 1 tablet daily by mouth.  Marland Kitchen omeprazole (PRILOSEC) 20 MG capsule Take 1 capsule (20 mg total)  by mouth 2 (two) times daily before a meal.  . oxybutynin (DITROPAN-XL) 10 MG 24 hr tablet Take 1 tablet by mouth daily.  . Polyethylene Glycol 3350 (MIRALAX PO) Take daily by mouth.  . TURMERIC PO Take daily by mouth.  . vitamin C (ASCORBIC ACID) 500 MG tablet Take 500 mg daily by mouth.    Allergies:  Allergies  Allergen Reactions  . Food     Patient is a vegetarian (does eat FISH)  . Tape Other (See Comments)    SKIN TEARS PATIENT TOLERATES PAPER TAPE SKIN TEARS PATIENT TOLERATES PAPER TAPE     Review of Systems:  A fourteen system review of systems was performed and found to be positive as per HPI.   Objective:   Blood pressure 128/83, pulse 66, height 5\' 4"  (1.626 m), weight 148 lb 12.8 oz (67.5 kg), SpO2 (!) 66 %. Body mass index is 25.54 kg/m. General:  Well Developed, well nourished, appropriate for stated age.  Neuro:  Alert and oriented,  extra-ocular muscles intact  HEENT:  Normocephalic, atraumatic, neck supple, no carotid bruits appreciated  Skin:  no gross rash, warm, pink. Cardiac:  RRR, S1 S2 Respiratory:  ECTA B/L and A/P, Not using accessory muscles, speaking in full sentences- unlabored. Vascular:  Ext warm, no cyanosis apprec.; cap RF less 2 sec. Psych:  No HI/SI, judgement and insight good, Euthymic mood. Full Affect. Edema: 2 + non pitting edema in the right LE; 1 + non pitting edema in the left LE.

## 2017-09-03 NOTE — Patient Instructions (Addendum)
  We will obtain blood work today and will call you with the results and or bring you in if needed to discuss them.  Also remember April- June (3-6 months from your prior study)  you will need to get a repeat CT scan of your chest.  The orders already in.  If you do not hear from somebody towards the end of April or early May, please call the office and speak with Dorothea Ogle about this.    If you have continued hoarseness despite the treatment recommended by Dr. Erik Obey of ear nose and throat for reflux with the omeprazole twice daily, I would follow up with their office and call them to inquire about your continued symptoms after about 1 month.

## 2017-09-04 DIAGNOSIS — Z96652 Presence of left artificial knee joint: Secondary | ICD-10-CM | POA: Diagnosis not present

## 2017-09-04 LAB — TSH: TSH: 2.23 u[IU]/mL (ref 0.450–4.500)

## 2017-09-04 LAB — PHOSPHORUS: Phosphorus: 3.3 mg/dL (ref 2.5–4.5)

## 2017-09-04 LAB — COMPREHENSIVE METABOLIC PANEL
ALBUMIN: 4.1 g/dL (ref 3.5–4.7)
ALT: 14 IU/L (ref 0–32)
AST: 22 IU/L (ref 0–40)
Albumin/Globulin Ratio: 2 (ref 1.2–2.2)
Alkaline Phosphatase: 104 IU/L (ref 39–117)
BUN / CREAT RATIO: 10 — AB (ref 12–28)
BUN: 8 mg/dL (ref 8–27)
Bilirubin Total: 0.4 mg/dL (ref 0.0–1.2)
CO2: 24 mmol/L (ref 20–29)
CREATININE: 0.84 mg/dL (ref 0.57–1.00)
Calcium: 9.6 mg/dL (ref 8.7–10.3)
Chloride: 101 mmol/L (ref 96–106)
GFR calc non Af Amer: 64 mL/min/{1.73_m2} (ref >59)
GFR, EST AFRICAN AMERICAN: 74 mL/min/{1.73_m2} (ref >59)
GLUCOSE: 91 mg/dL (ref 65–99)
Globulin, Total: 2.1 g/dL (ref 1.5–4.5)
Potassium: 4.5 mmol/L (ref 3.5–5.2)
Sodium: 140 mmol/L (ref 134–144)
TOTAL PROTEIN: 6.2 g/dL (ref 6.0–8.5)

## 2017-09-04 LAB — LIPID PANEL
CHOLESTEROL TOTAL: 225 mg/dL — AB (ref 100–199)
Chol/HDL Ratio: 3.3 ratio (ref 0.0–4.4)
HDL: 69 mg/dL (ref >39)
LDL CALC: 136 mg/dL — AB (ref 0–99)
Triglycerides: 99 mg/dL (ref 0–149)
VLDL CHOLESTEROL CAL: 20 mg/dL (ref 5–40)

## 2017-09-04 LAB — T4, FREE: FREE T4: 1.57 ng/dL (ref 0.82–1.77)

## 2017-09-04 LAB — MAGNESIUM: Magnesium: 2.1 mg/dL (ref 1.6–2.3)

## 2017-09-04 LAB — VITAMIN D 25 HYDROXY (VIT D DEFICIENCY, FRACTURES): Vit D, 25-Hydroxy: 41.2 ng/mL (ref 30.0–100.0)

## 2017-09-24 DIAGNOSIS — J069 Acute upper respiratory infection, unspecified: Secondary | ICD-10-CM | POA: Diagnosis not present

## 2017-09-24 DIAGNOSIS — R05 Cough: Secondary | ICD-10-CM | POA: Diagnosis not present

## 2017-10-02 ENCOUNTER — Encounter: Payer: Self-pay | Admitting: Podiatry

## 2017-10-02 ENCOUNTER — Ambulatory Visit (INDEPENDENT_AMBULATORY_CARE_PROVIDER_SITE_OTHER): Payer: Medicare Other | Admitting: Podiatry

## 2017-10-02 VITALS — BP 114/56 | HR 64 | Ht 64.0 in | Wt 140.0 lb

## 2017-10-02 DIAGNOSIS — B351 Tinea unguium: Secondary | ICD-10-CM | POA: Diagnosis not present

## 2017-10-02 DIAGNOSIS — M2012 Hallux valgus (acquired), left foot: Secondary | ICD-10-CM

## 2017-10-02 DIAGNOSIS — M21611 Bunion of right foot: Secondary | ICD-10-CM

## 2017-10-02 DIAGNOSIS — M21612 Bunion of left foot: Secondary | ICD-10-CM

## 2017-10-02 DIAGNOSIS — M79676 Pain in unspecified toe(s): Secondary | ICD-10-CM

## 2017-10-02 DIAGNOSIS — M2011 Hallux valgus (acquired), right foot: Secondary | ICD-10-CM

## 2017-10-02 DIAGNOSIS — M2042 Other hammer toe(s) (acquired), left foot: Secondary | ICD-10-CM

## 2017-10-02 DIAGNOSIS — M2041 Other hammer toe(s) (acquired), right foot: Secondary | ICD-10-CM | POA: Diagnosis not present

## 2017-10-02 DIAGNOSIS — Q828 Other specified congenital malformations of skin: Secondary | ICD-10-CM

## 2017-10-02 DIAGNOSIS — M79609 Pain in unspecified limb: Secondary | ICD-10-CM

## 2017-10-02 NOTE — Progress Notes (Signed)
   Subjective:    Patient ID: ANALISSE RANDLE, female    DOB: 14-Dec-1930, 84 y.o.   MRN: [id number]  HPI  Chief Complaint  Patient presents with  . Callouses    3rd toe left foot - tries to shave it down herself  . Bunions    bilateral, not painful at this time  . Nail Problem    bilateral elongated thickened toenails       Review of Systems  All other systems reviewed and are negative.      Objective:   Physical Exam        Assessment & Plan:
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
## 2017-10-30 ENCOUNTER — Ambulatory Visit: Payer: Medicare Other | Admitting: Podiatry

## 2017-10-31 ENCOUNTER — Other Ambulatory Visit: Payer: Self-pay | Admitting: Gastroenterology

## 2017-11-10 ENCOUNTER — Other Ambulatory Visit: Payer: Self-pay

## 2017-11-10 MED ORDER — OMEPRAZOLE 20 MG PO CPDR: 20.0000 mg | DELAYED_RELEASE_CAPSULE | Freq: Two times a day (BID) | ORAL | 5 refills | Status: DC

## 2017-11-24 DIAGNOSIS — M2042 Other hammer toe(s) (acquired), left foot: Secondary | ICD-10-CM | POA: Diagnosis not present

## 2017-12-09 DIAGNOSIS — M2042 Other hammer toe(s) (acquired), left foot: Secondary | ICD-10-CM | POA: Diagnosis not present

## 2018-01-06 IMAGING — US US CAROTID DUPLEX BILAT
1 series · 13 of 24 positions shown · non-contrast
Comparison: None.

CLINICAL DATA: TIA symptoms, speech difficulty

EXAM:
BILATERAL CAROTID DUPLEX ULTRASOUND
TECHNIQUE: Gray scale imaging, color Doppler and duplex ultrasound were
performed of bilateral carotid and vertebral arteries in the neck.

[Series 1: us carotid duplex bilat · 0.08mm/px · 13 of 81 slices shown]
[im 1/81]
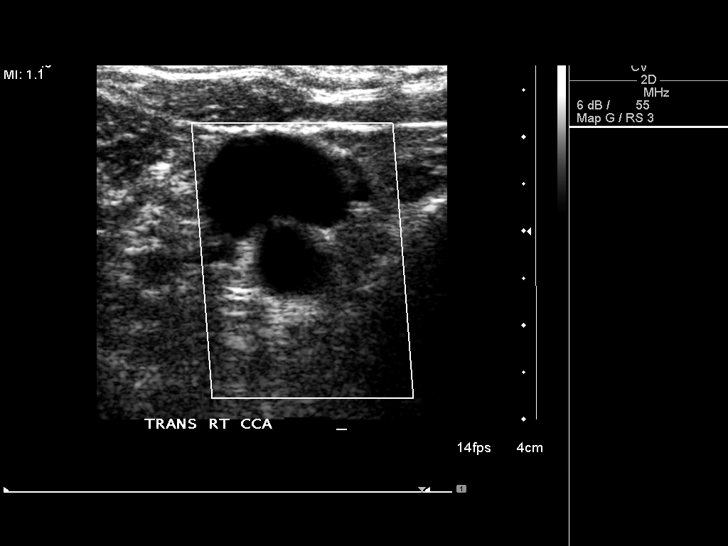
[im 7/81]
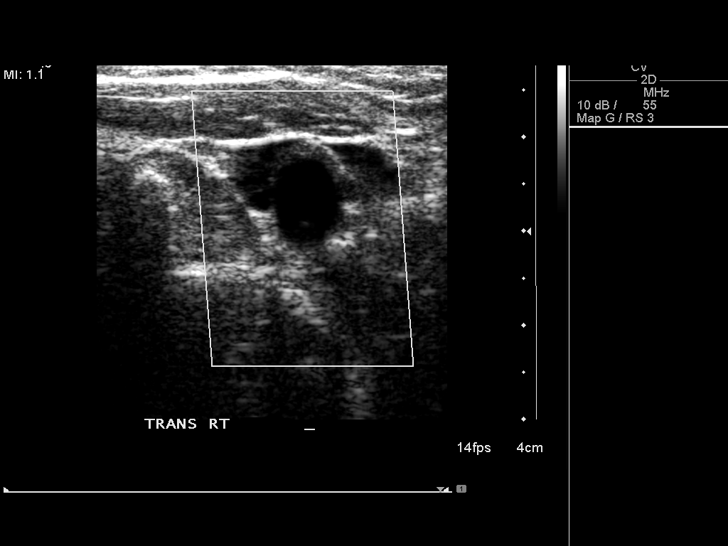
[im 14/81]
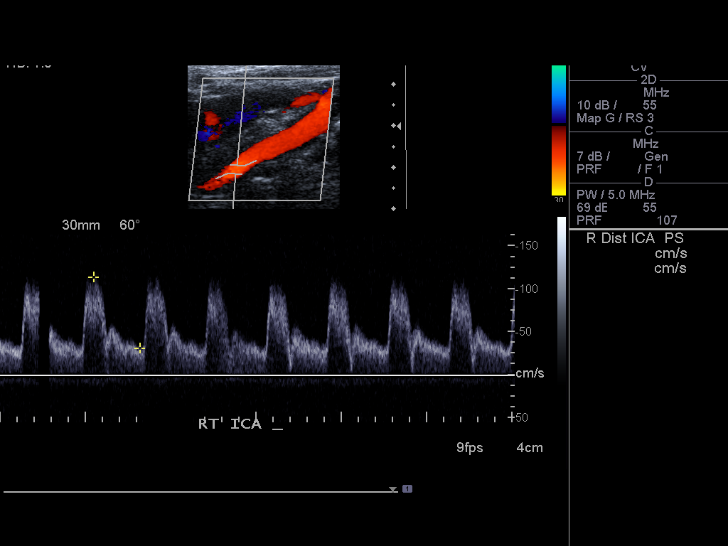
[im 21/81]
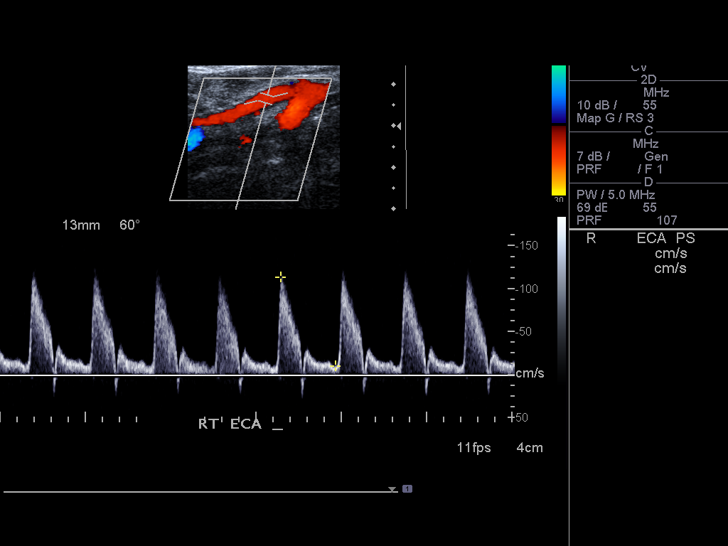
[im 28/81]
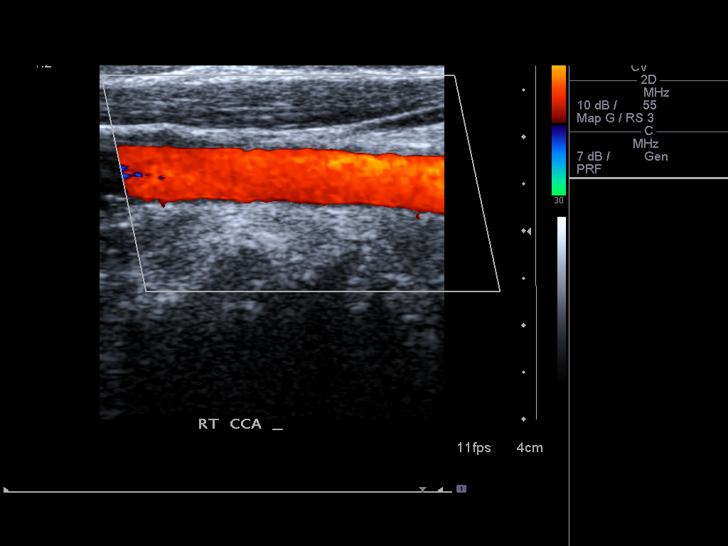
[im 35/81]
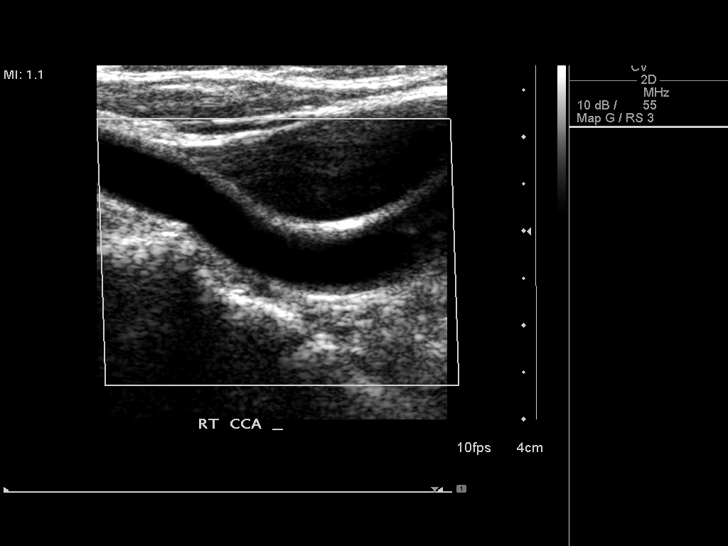
[im 42/81]
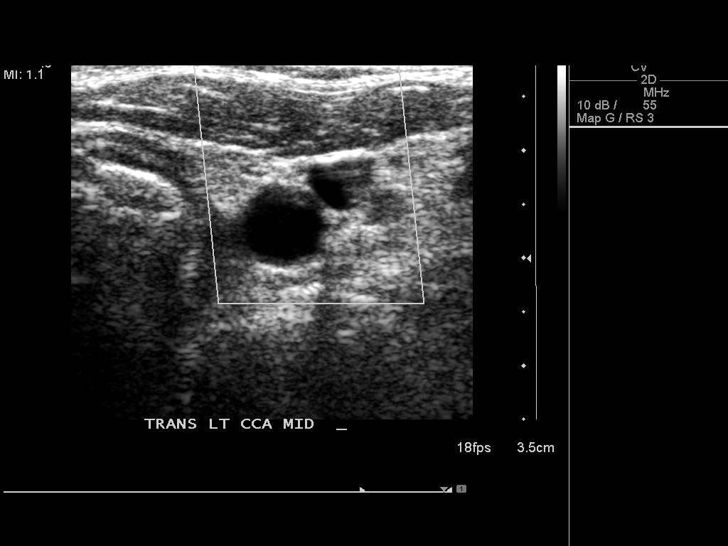
[im 46/81]
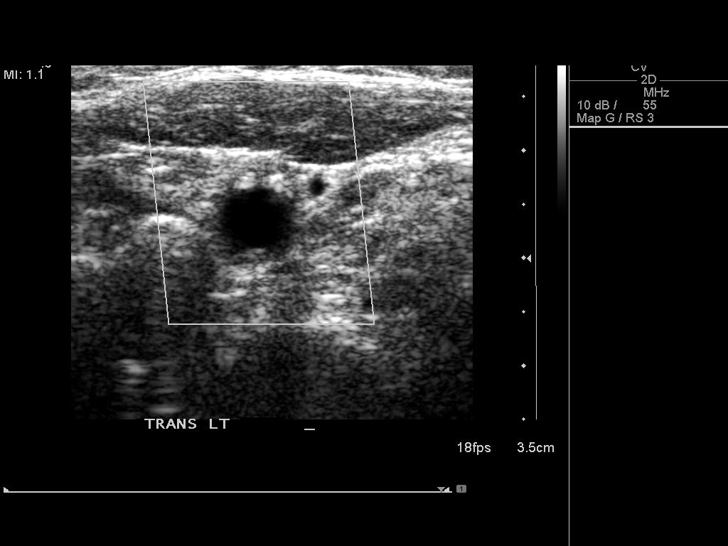
[im 53/81]
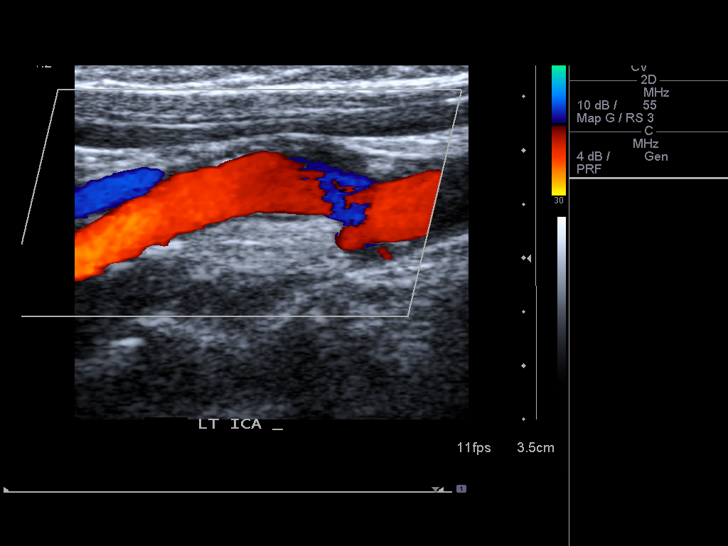
[im 60/81]
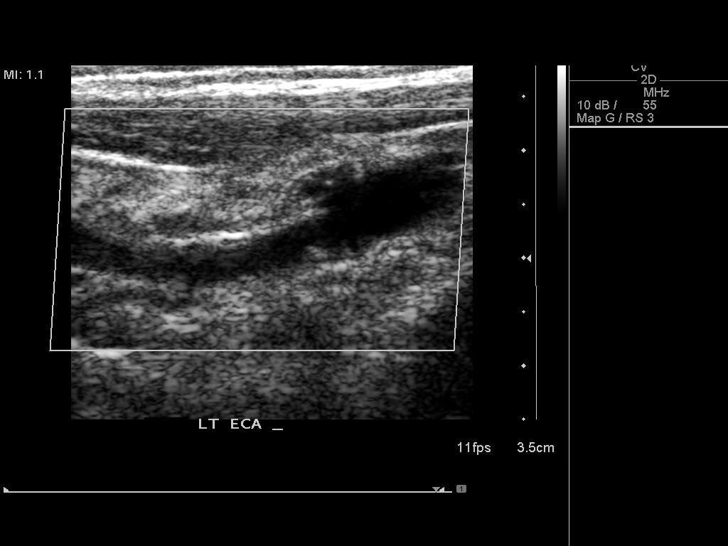
[im 67/81]
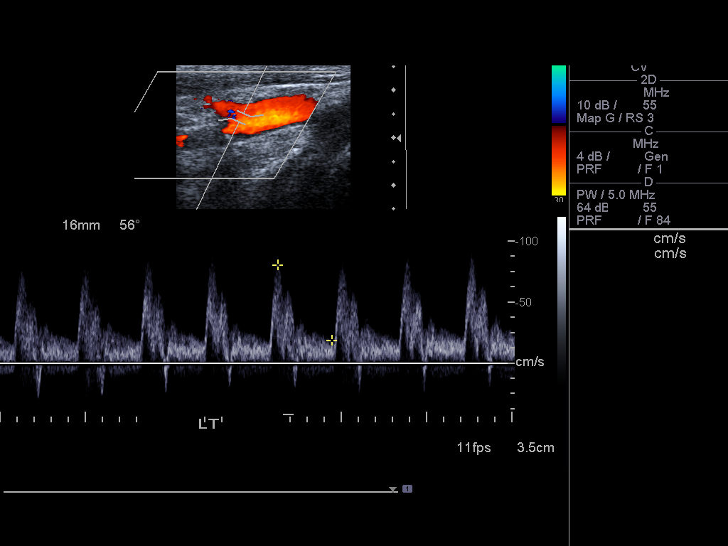
[im 74/81]
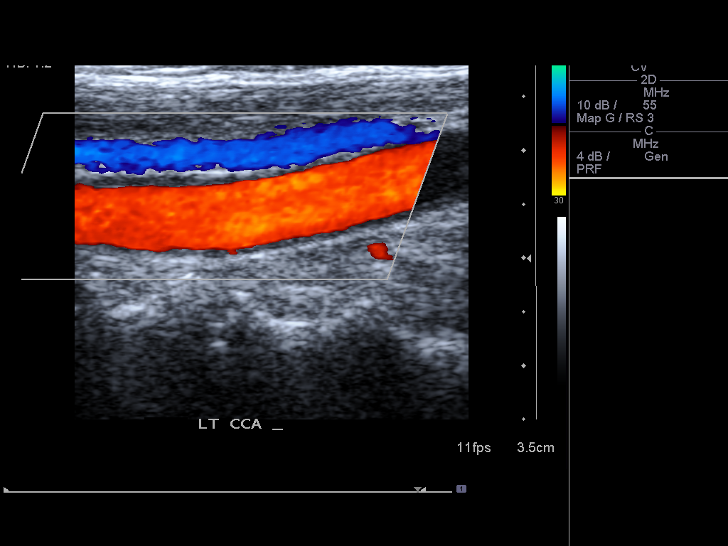
[im 81/81]
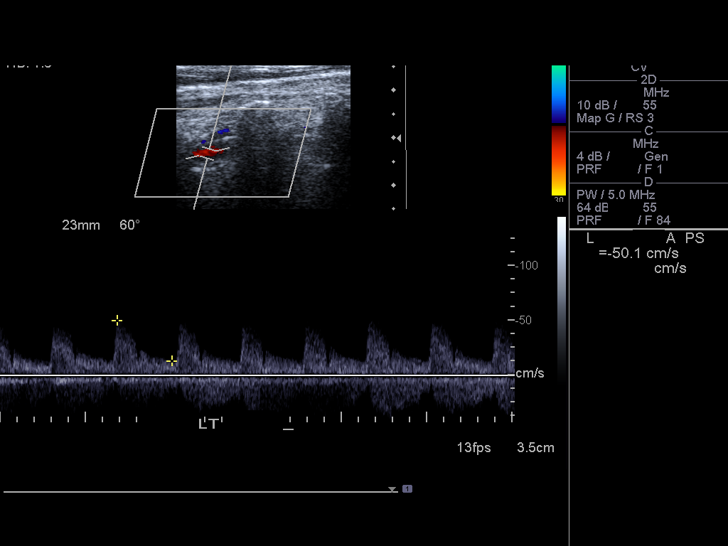

[13 of 24 positions shown; findings below may reference images not displayed]

FINDINGS: Criteria: Quantification of carotid stenosis is based on velocity
parameters that correlate the residual internal carotid diameter
with NASCET-based stenosis levels, using the diameter of the distal
internal carotid lumen as the denominator for stenosis measurement.

The following velocity measurements were obtained:

RIGHT

ICA:  129/29 cm/sec

CCA:  99/24 cm/sec

SYSTOLIC ICA/CCA RATIO:

DIASTOLIC ICA/CCA RATIO:

ECA:  114 cm/sec

LEFT

ICA:  90/29 cm/sec

CCA:  101/19 cm/sec

SYSTOLIC ICA/CCA RATIO:

DIASTOLIC ICA/CCA RATIO:

ECA:  84 cm/sec

RIGHT CAROTID ARTERY: Minor echogenic shadowing plaque formation. No
hemodynamically significant right ICA stenosis, velocity elevation,
or turbulent flow. Degree of narrowing less than 50%.

RIGHT VERTEBRAL ARTERY:  Antegrade

LEFT CAROTID ARTERY: Similar scattered minor echogenic plaque
formation. No hemodynamically significant left ICA stenosis,
velocity elevation, or turbulent flow.

LEFT VERTEBRAL ARTERY:  Antegrade
IMPRESSION: Minor carotid atherosclerosis. No hemodynamically significant ICA
stenosis. Degree of narrowing less than 50% bilaterally.

Patent antegrade vertebral flow bilaterally.

## 2018-01-23 DIAGNOSIS — Z4789 Encounter for other orthopedic aftercare: Secondary | ICD-10-CM | POA: Diagnosis not present

## 2018-01-23 DIAGNOSIS — M79672 Pain in left foot: Secondary | ICD-10-CM | POA: Diagnosis not present

## 2018-02-24 IMAGING — CR DG KNEE COMPLETE 4+V*L*
3 series · 3 of 3 positions shown · non-contrast
Comparison: None.

CLINICAL DATA: Medial left knee pain and swelling for 6-8 months.

EXAM:
LEFT KNEE - COMPLETE 4+ VIEW

[w knee ap left (1 of 2)]
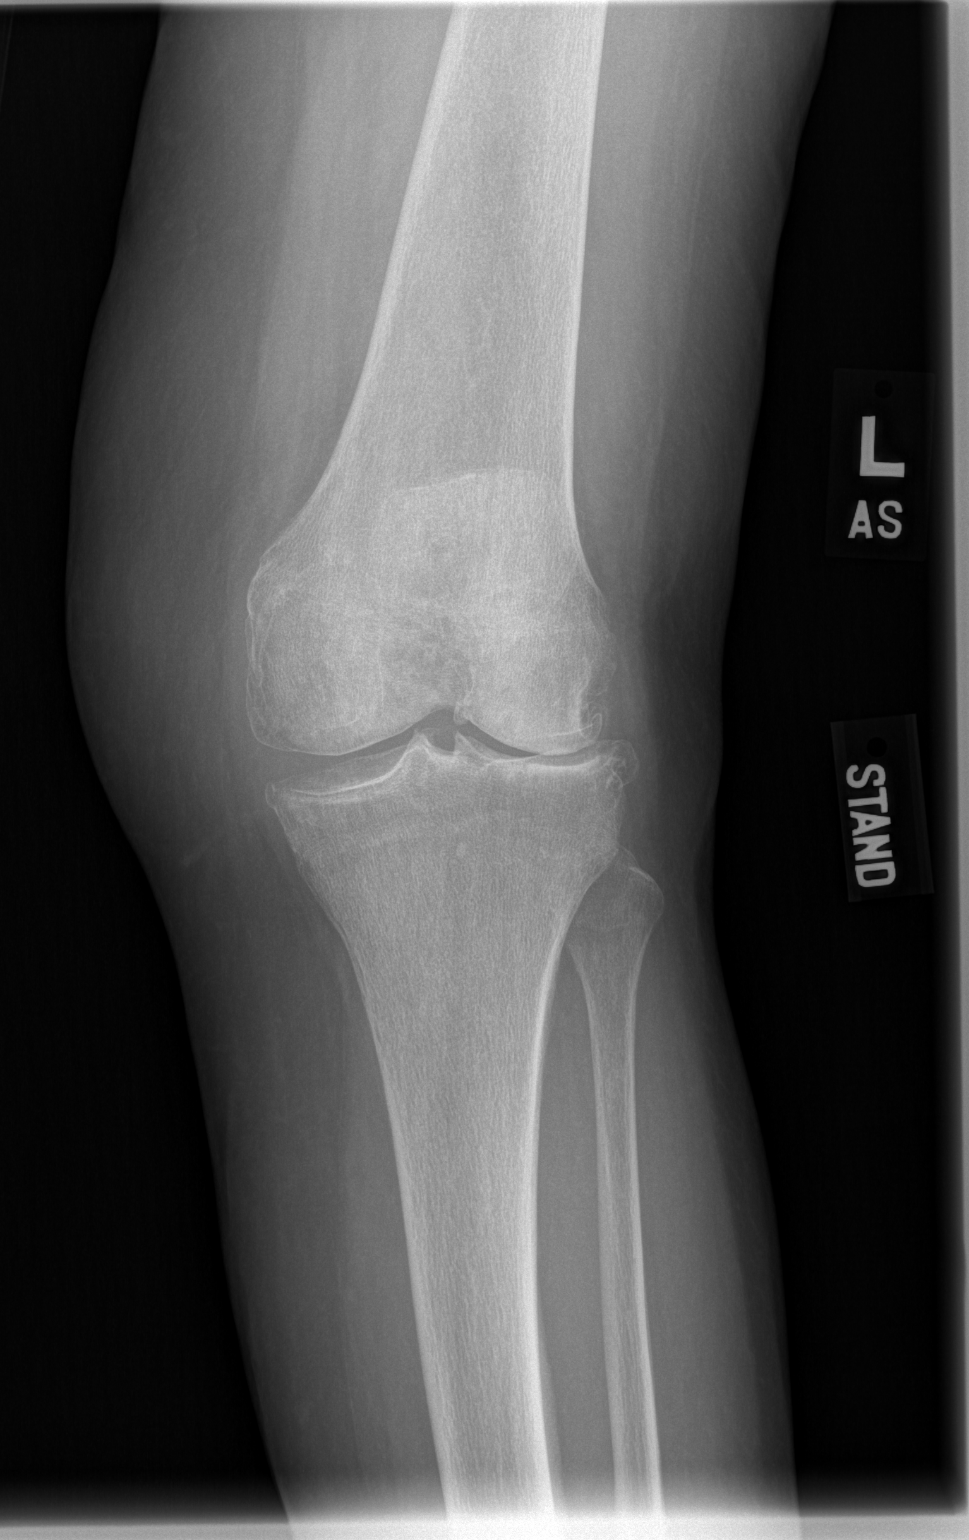

[w knee lat. left]
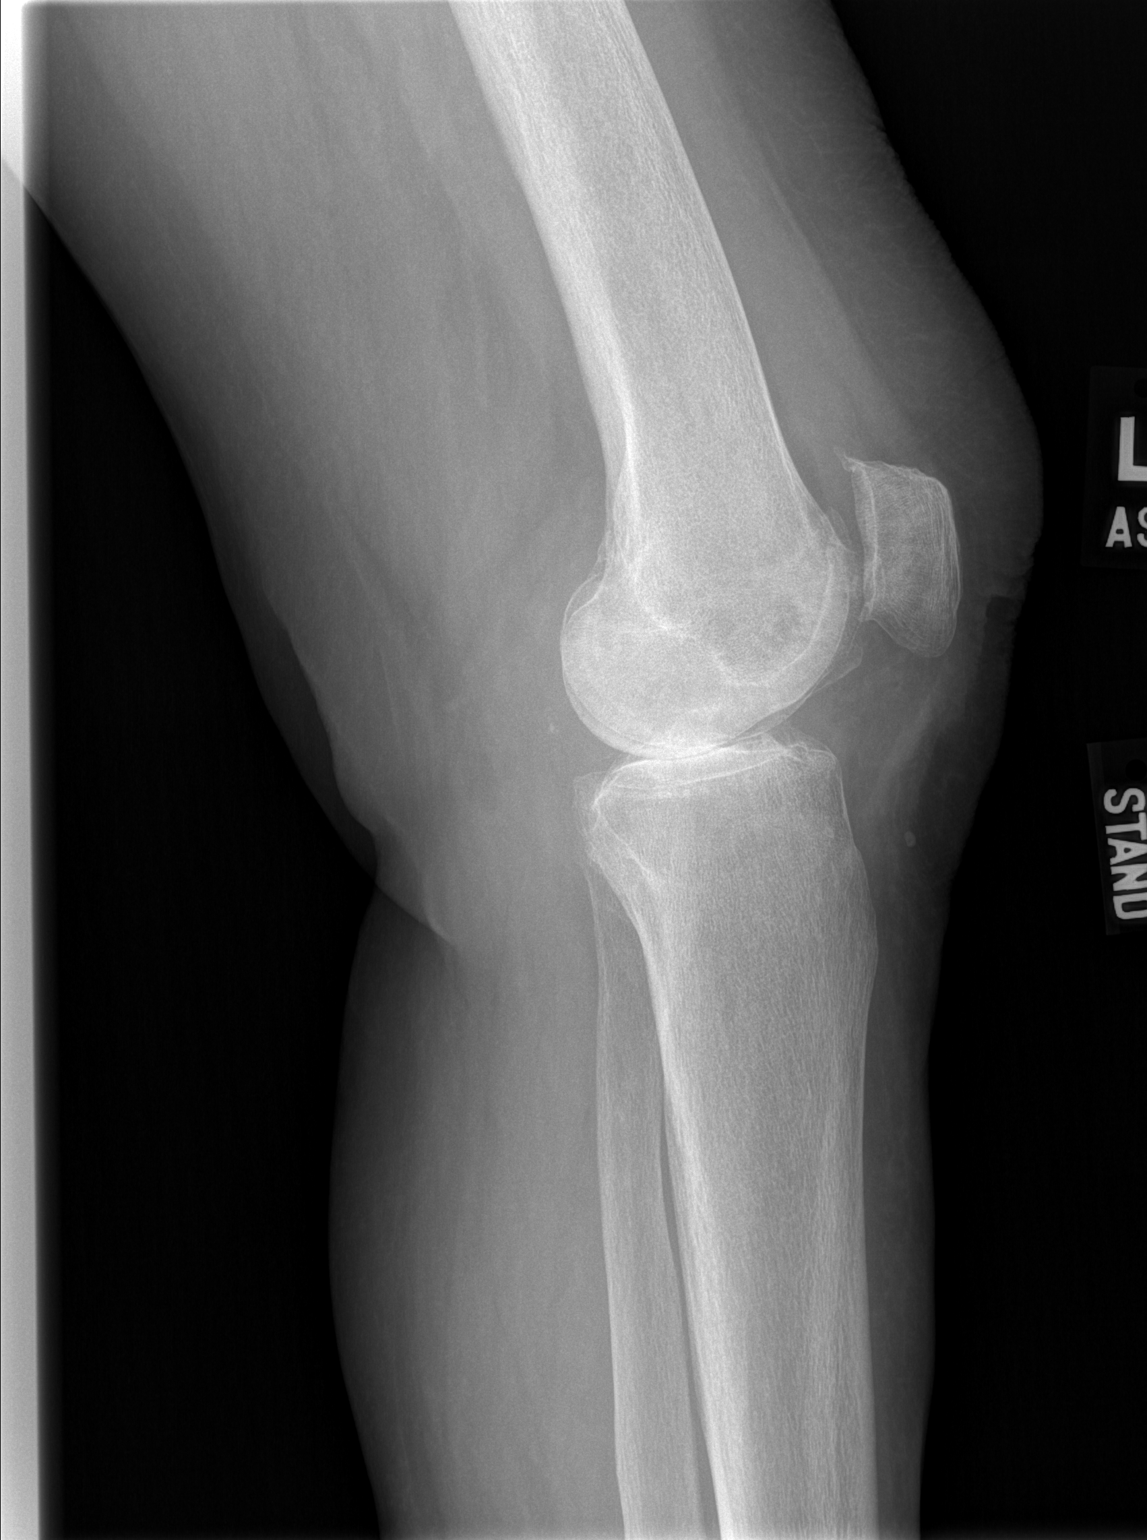

[w knee ap left (2 of 2)]
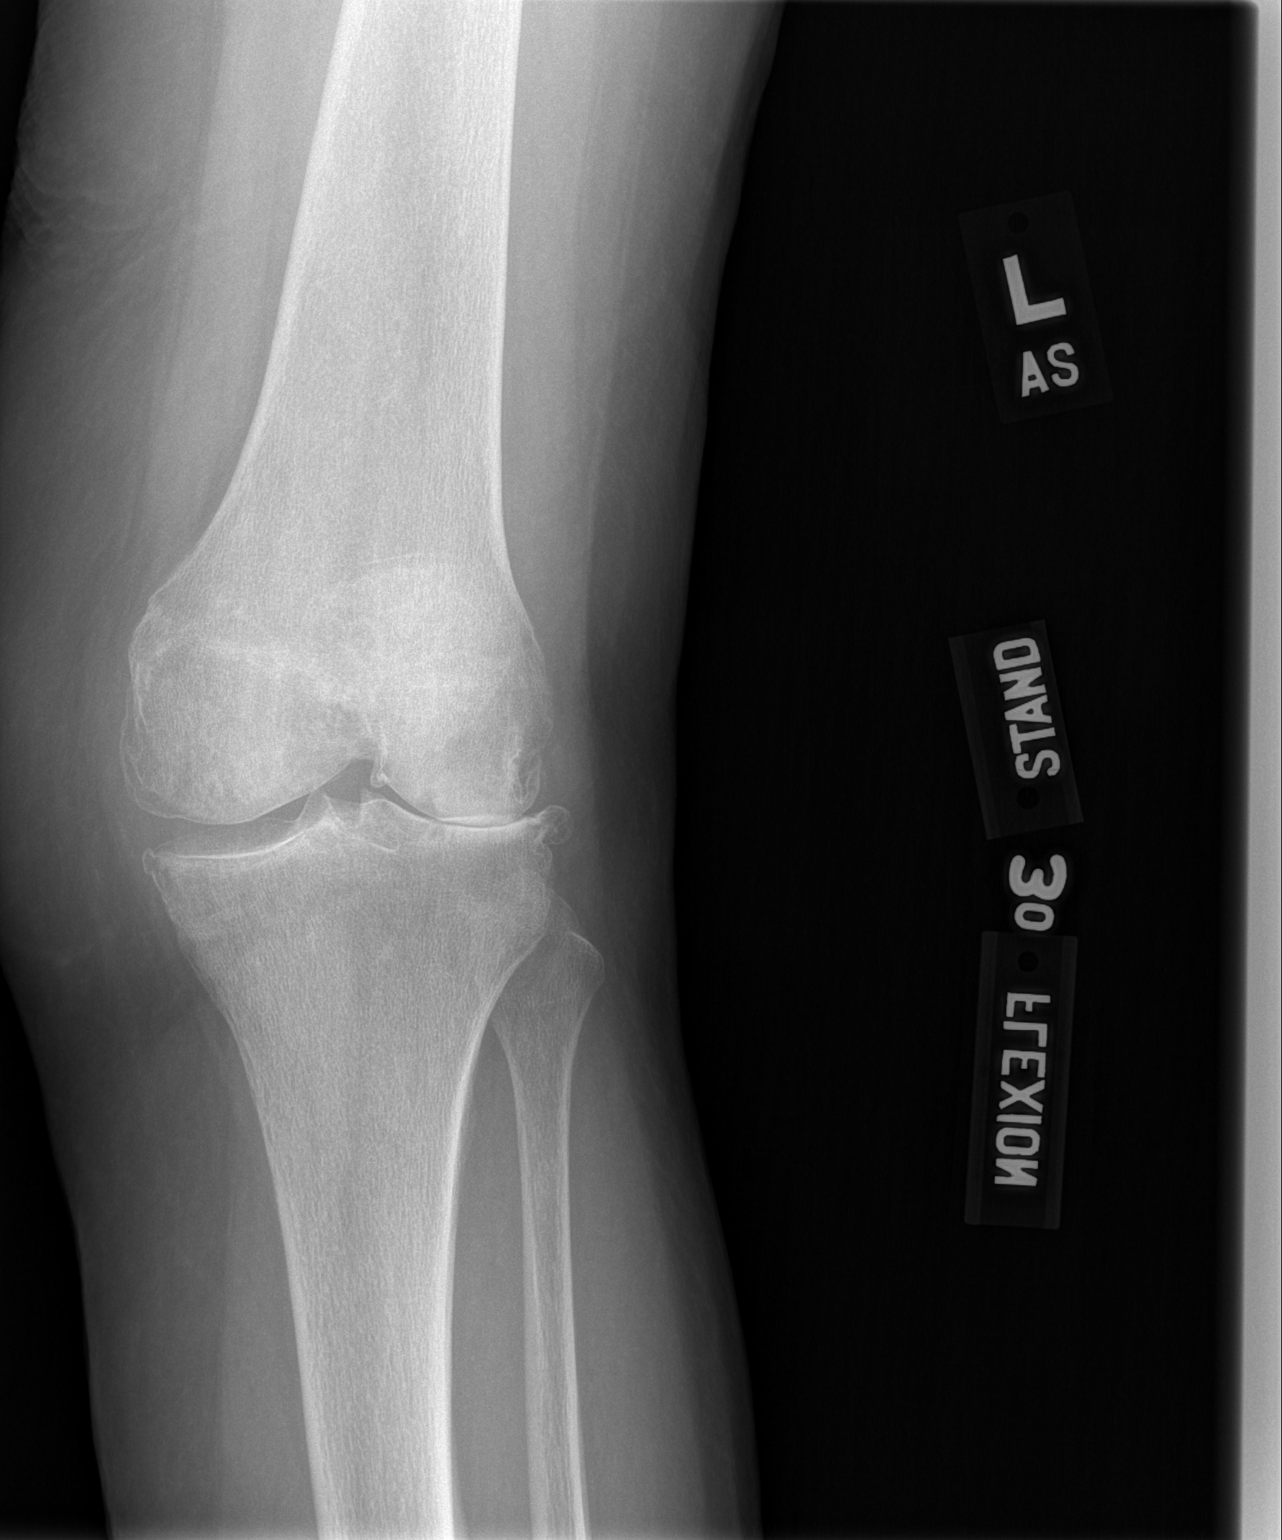

[3 of 3 positions shown; findings below may reference images not displayed]

FINDINGS: No fracture or dislocation. Moderate joint effusion. Mild medial
femorotibial compartment osteoarthritis. Severe lateral femorotibial
compartment osteoarthritis with severe joint space narrowing and
marginal osteophytosis. Mild lateral patellofemoral compartment
osteoarthritis.
IMPRESSION: 1. No acute osseous injury of the left knee.
2. Tricompartmental osteoarthritis of the left knee, most severe in
the lateral femorotibial compartment.

## 2018-04-06 DIAGNOSIS — M5136 Other intervertebral disc degeneration, lumbar region: Secondary | ICD-10-CM | POA: Diagnosis not present

## 2018-04-06 DIAGNOSIS — M6240 Contracture of muscle, unspecified site: Secondary | ICD-10-CM | POA: Diagnosis not present

## 2018-04-06 DIAGNOSIS — M9901 Segmental and somatic dysfunction of cervical region: Secondary | ICD-10-CM | POA: Diagnosis not present

## 2018-04-06 DIAGNOSIS — M9903 Segmental and somatic dysfunction of lumbar region: Secondary | ICD-10-CM | POA: Diagnosis not present

## 2018-04-22 DIAGNOSIS — M9901 Segmental and somatic dysfunction of cervical region: Secondary | ICD-10-CM | POA: Diagnosis not present

## 2018-04-22 DIAGNOSIS — M5136 Other intervertebral disc degeneration, lumbar region: Secondary | ICD-10-CM | POA: Diagnosis not present

## 2018-04-22 DIAGNOSIS — M6240 Contracture of muscle, unspecified site: Secondary | ICD-10-CM | POA: Diagnosis not present

## 2018-04-22 DIAGNOSIS — M9903 Segmental and somatic dysfunction of lumbar region: Secondary | ICD-10-CM | POA: Diagnosis not present

## 2018-04-29 ENCOUNTER — Ambulatory Visit (INDEPENDENT_AMBULATORY_CARE_PROVIDER_SITE_OTHER): Payer: Medicare Other | Admitting: Nurse Practitioner

## 2018-04-29 ENCOUNTER — Other Ambulatory Visit (INDEPENDENT_AMBULATORY_CARE_PROVIDER_SITE_OTHER): Payer: Medicare Other

## 2018-04-29 ENCOUNTER — Encounter: Payer: Self-pay | Admitting: Nurse Practitioner

## 2018-04-29 VITALS — BP 110/60 | HR 68 | Ht 63.75 in | Wt 137.0 lb

## 2018-04-29 DIAGNOSIS — R1012 Left upper quadrant pain: Secondary | ICD-10-CM

## 2018-04-29 DIAGNOSIS — R1032 Left lower quadrant pain: Secondary | ICD-10-CM

## 2018-04-29 LAB — LIPASE: Lipase: 29 U/L (ref 11.0–59.0)

## 2018-04-29 MED ORDER — LIDOCAINE 5 % EX PTCH: 1.0000 | MEDICATED_PATCH | CUTANEOUS | 0 refills | Status: DC

## 2018-04-29 MED ORDER — OMEPRAZOLE 20 MG PO CPDR: 20.0000 mg | DELAYED_RELEASE_CAPSULE | ORAL | 3 refills | Status: DC

## 2018-04-29 NOTE — Patient Instructions (Addendum)
If you are age 82 or older, your body mass index should be between 23-30. Your Body mass index is 23.7 kg/m. If this is out of the aforementioned range listed, please consider follow up with your Primary Care Provider.  If you are age 39 or younger, your body mass index should be between 19-25. Your Body mass index is 23.7 kg/m. If this is out of the aformentioned range listed, please consider follow up with your Primary Care Provider.   Your provider has requested that you go to the basement level for lab work before leaving today. Press "B" on the elevator. The lab is located at the first door on the left as you exit the elevator. Lipase  We have sent the following medications to your pharmacy for you to pick up at your convenience: Omeprazole 20 mg Lidoderm patch  Follow up with me on October 30 ,2019 at 9 am.  Thank you for choosing me and [facility].   Tye Savoy, NP

## 2018-04-29 NOTE — Progress Notes (Signed)
Primary GI: Williams Cellar, MD  Chief Complaint:    Pain under left rib cage  IMPRESSION and PLAN:    84 year old female with pain under left rib cage mainly positional when she slouches over but also noticeable when hungry.  She has no associated nausea, vomiting or unintentional weight loss.  Abdominal exam is benign.  Patient had a difficult time locating the area of pain in clinic.  I cannot explain the association with being hungry but otherwise her discomfort does seem musculoskeletal. -She does take a baby aspirin so just in case of asked her to restart omeprazole every morning for the time being until we can sort things out -Trial of Lidoderm patch -I will obtain a lipase level but again I think this is musculoskeletal -Return to see me in 3 weeks, sooner if new symptoms develop or worsening pain.  HPI:    Patient is an 84 year old female known to Dr. Havery Moros.  I saw her November of last year for evaluation of heartburn, hoarse voice, occasional solid food dysphagia, chronic constipation and CT scan showing some thickening at the splenic flexure.  ED treated "colitis" with antibiotics.  I started her on omeprazole with plans for EGD if symptoms did not improve.  She was scheduled for colonoscopy for evaluation of CT findings, see colonoscopy findings below.  Patient was seen back in clinic in January of this year for ongoing hoarse voice and dysphagia.  She was subsequently scheduled for EGD with findings as below.  Colonoscopy Nov 2019 - The examined portion of the ileum was normal. - One 3 mm polyp in the transverse colon, removed with a cold biopsy forceps. Resected and retrieved. - Patchy mild inflammation was found at the splenic flexure and in the distal transverse colon. Biopsied. - Diverticulosis in the sigmoid colon. - The examination was otherwise normal on direct and retroflexion views. Suspect inflammatory changes of the splenic flexure / transverse colon are  likely due to self limited infection, no evidence of IBD or colon mass. Patient's symptoms have improved / Resolved.  Surgical [P], transverse, polyp - TUBULAR ADENOMA (ONE FRAGMENT). - NO HIGH GRADE DYSPLASIA OR MALIGNANCY. 2. Surgical [P], splenic flexure and transverse - HYPEREMIA WITH FOCAL EROSION. - SEE MICROSCOPIC DESCRIPTION - NO GRANULOMAS OR DYSPLASIA.  EGD Jan 2019 Esophagogastric landmarks identified. - 8 cm hiatal hernia. - Tortous esophagus - Benign-appearing esophageal stenosis at the GEJ. Dilated to 66mm with good result. Superficial mucosal wrent from balloon tip within hernia sac as outlined above, did not appear significant but 2 clips were placed. - Normal stomach. - Normal duodenal bulb and second portion of the duodenum. Suspect dysphagia due to mild stenosis in the setting of very large hiatal hernia, while dysmotility is also possible. No obvious reflux esophagitis in light of voice changes.  Patient returns today with complaints of pain in her left upper abdomen.  She has a brother with some GI malignancies and is concerned about her symptoms.  She gives a 2 to 3-week history of discomfort under left rib cage.  She generally feels fine if sitting upright but slouching causes discomfort under left rib cage.  Also when she is hungry the discomfort is noticeable.  Patient has been seeing a chiropractor and getting back adjustments, she has a history of scoliosis.  She has no nausea, vomiting or unintentional weight loss.  Her bowels are moving fine on MiraLAX.  She takes no prescription medications.  She does take a baby aspirin every day.  Patient has not been on any anti-reflux medications for several months or maybe even a year.  Review of systems:     No chest pain, no SOB, no fevers, no urinary sx   Past Medical History:  Diagnosis Date  . ADD (attention deficit disorder)   . Allergy   . Anemia    as a teenager  . Arthritis   . Complication of anesthesia     slow to wake up after colonoscopy  . Edema   . Hearing loss   . History of hiatal hernia    hx of  . Hypothyroidism    hx of as child and during pregnancy  . Neuromuscular disorder ([place])   . Nocturia   . Pneumonia    hx of after bronchitis  . PONV (postoperative nausea and vomiting)   . Thyroid disease     Patient's surgical history, family medical history, social history, medications and allergies were all reviewed in Epic   Creatinine clearance cannot be calculated (Patient's most recent lab result is older than the maximum 21 days allowed.)  Current Outpatient Medications  Medication Sig Dispense Refill  . aspirin EC 81 MG tablet Take 81 mg by mouth daily.    . Biotin 1000 MCG tablet Take 1,000 mcg daily by mouth.    . Cholecalciferol (VITAMIN D PO) Take 1 capsule daily by mouth.    . folic acid (FOLVITE) 1 MG tablet Take 1 mg daily by mouth.    Marland Kitchen GARCINIA CAMBOGIA-CHROMIUM PO Take 1 tablet by mouth 2 (two) times daily.    Marland Kitchen GLUTAMINE PO Take daily by mouth.    . Multiple Vitamin (MULTIVITAMIN) tablet Take 1 tablet daily by mouth.    Marland Kitchen omeprazole (PRILOSEC) 20 MG capsule Take 1 capsule (20 mg total) by mouth 2 (two) times daily before a meal. 60 capsule 5  . oxybutynin (DITROPAN-XL) 10 MG 24 hr tablet Take 1 tablet by mouth daily.    . Polyethylene Glycol 3350 (MIRALAX PO) Take daily by mouth.    . TURMERIC PO Take daily by mouth.    . vitamin C (ASCORBIC ACID) 500 MG tablet Take 500 mg daily by mouth.     No current facility-administered medications for this visit.     Physical Exam:     BP 110/60   Pulse 68   Ht 5' 3.75" (1.619 m)   Wt 137 lb (62.1 kg)   BMI 23.70 kg/m   GENERAL:  Pleasant female in NAD PSYCH: : Cooperative, normal affect EENT:  conjunctiva pink, mucous membranes moist, neck supple without masses CARDIAC:  RRR, no murmur heard, no peripheral edema PULM: Normal respiratory effort, lungs CTA bilaterally, no wheezing ABDOMEN:  Nondistended,  soft, nontender. No obvious masses, no hepatomegaly,  normal bowel sounds. Negative Carnett's sign SKIN:  turgor, no lesions seen Musculoskeletal:  Normal muscle tone, normal strength NEURO: Alert and oriented x 3, no focal neurologic deficits   Tye Savoy , NP 04/29/2018, 9:24 AM

## 2018-04-30 DIAGNOSIS — M9901 Segmental and somatic dysfunction of cervical region: Secondary | ICD-10-CM | POA: Diagnosis not present

## 2018-04-30 DIAGNOSIS — M5136 Other intervertebral disc degeneration, lumbar region: Secondary | ICD-10-CM | POA: Diagnosis not present

## 2018-04-30 DIAGNOSIS — M6240 Contracture of muscle, unspecified site: Secondary | ICD-10-CM | POA: Diagnosis not present

## 2018-04-30 DIAGNOSIS — M9903 Segmental and somatic dysfunction of lumbar region: Secondary | ICD-10-CM | POA: Diagnosis not present

## 2018-04-30 NOTE — Progress Notes (Signed)
Agree with assessment and plan as outlined. Symptoms likely musculoskeletal as described, agree with plan with follow up to reassess her.

## 2018-05-07 DIAGNOSIS — M5136 Other intervertebral disc degeneration, lumbar region: Secondary | ICD-10-CM | POA: Diagnosis not present

## 2018-05-07 DIAGNOSIS — M9901 Segmental and somatic dysfunction of cervical region: Secondary | ICD-10-CM | POA: Diagnosis not present

## 2018-05-07 DIAGNOSIS — M6240 Contracture of muscle, unspecified site: Secondary | ICD-10-CM | POA: Diagnosis not present

## 2018-05-07 DIAGNOSIS — M9903 Segmental and somatic dysfunction of lumbar region: Secondary | ICD-10-CM | POA: Diagnosis not present

## 2018-05-14 ENCOUNTER — Ambulatory Visit: Payer: Medicare Other | Admitting: Family Medicine

## 2018-05-20 ENCOUNTER — Ambulatory Visit: Payer: Medicare Other | Admitting: Nurse Practitioner

## 2018-05-28 DIAGNOSIS — M6240 Contracture of muscle, unspecified site: Secondary | ICD-10-CM | POA: Diagnosis not present

## 2018-05-28 DIAGNOSIS — M9901 Segmental and somatic dysfunction of cervical region: Secondary | ICD-10-CM | POA: Diagnosis not present

## 2018-05-28 DIAGNOSIS — M5136 Other intervertebral disc degeneration, lumbar region: Secondary | ICD-10-CM | POA: Diagnosis not present

## 2018-05-28 DIAGNOSIS — M9903 Segmental and somatic dysfunction of lumbar region: Secondary | ICD-10-CM | POA: Diagnosis not present

## 2018-06-04 DIAGNOSIS — M9903 Segmental and somatic dysfunction of lumbar region: Secondary | ICD-10-CM | POA: Diagnosis not present

## 2018-06-04 DIAGNOSIS — M9901 Segmental and somatic dysfunction of cervical region: Secondary | ICD-10-CM | POA: Diagnosis not present

## 2018-06-04 DIAGNOSIS — M50323 Other cervical disc degeneration at C6-C7 level: Secondary | ICD-10-CM | POA: Diagnosis not present

## 2018-06-04 DIAGNOSIS — M5136 Other intervertebral disc degeneration, lumbar region: Secondary | ICD-10-CM | POA: Diagnosis not present

## 2018-06-04 DIAGNOSIS — M791 Myalgia, unspecified site: Secondary | ICD-10-CM | POA: Diagnosis not present

## 2018-06-04 DIAGNOSIS — M50223 Other cervical disc displacement at C6-C7 level: Secondary | ICD-10-CM | POA: Diagnosis not present

## 2018-06-04 DIAGNOSIS — M47812 Spondylosis without myelopathy or radiculopathy, cervical region: Secondary | ICD-10-CM | POA: Diagnosis not present

## 2018-06-04 DIAGNOSIS — M6240 Contracture of muscle, unspecified site: Secondary | ICD-10-CM | POA: Diagnosis not present

## 2018-06-08 ENCOUNTER — Encounter: Payer: Self-pay | Admitting: Family Medicine

## 2018-06-08 ENCOUNTER — Ambulatory Visit (INDEPENDENT_AMBULATORY_CARE_PROVIDER_SITE_OTHER): Payer: Medicare Other | Admitting: Family Medicine

## 2018-06-08 VITALS — BP 121/75 | HR 71 | Temp 98.4°F | Ht 64.0 in | Wt 137.2 lb

## 2018-06-08 DIAGNOSIS — L609 Nail disorder, unspecified: Secondary | ICD-10-CM | POA: Diagnosis not present

## 2018-06-08 DIAGNOSIS — E538 Deficiency of other specified B group vitamins: Secondary | ICD-10-CM | POA: Diagnosis not present

## 2018-06-08 DIAGNOSIS — N3941 Urge incontinence: Secondary | ICD-10-CM

## 2018-06-08 DIAGNOSIS — E559 Vitamin D deficiency, unspecified: Secondary | ICD-10-CM

## 2018-06-08 DIAGNOSIS — E785 Hyperlipidemia, unspecified: Secondary | ICD-10-CM

## 2018-06-08 DIAGNOSIS — Z862 Personal history of diseases of the blood and blood-forming organs and certain disorders involving the immune mechanism: Secondary | ICD-10-CM | POA: Diagnosis not present

## 2018-06-08 DIAGNOSIS — Z789 Other specified health status: Secondary | ICD-10-CM | POA: Diagnosis not present

## 2018-06-08 DIAGNOSIS — E079 Disorder of thyroid, unspecified: Secondary | ICD-10-CM

## 2018-06-08 DIAGNOSIS — K5909 Other constipation: Secondary | ICD-10-CM

## 2018-06-08 MED ORDER — TOLTERODINE TARTRATE ER 2 MG PO CP24: 2.0000 mg | ORAL_CAPSULE | Freq: Every day | ORAL | 3 refills | Status: DC

## 2018-06-08 NOTE — Progress Notes (Signed)
Impression and Recommendations:    1. Urgency incontinence   2. Chronic constipation   3. Vegan diet   4. Nail abnormalities-  longitudinal nail ridges   5. h/o low B12- on supp   6. thyroid disease- at time did not warrant txmnt   7. Vitamin D deficiency   8. h/o Hyperlipidemia, mild       Hyperlipidemia:  Will not continue to check the lipid panel, due to the patient not wanting to start on cholesterol medications at all.  Per patient she would not go on them for any reason -Continues to eat vegan diet, be appropriate weight and is very active during the day but no routine exercise   Chronic Constipation:  -Advised the patient to remain hydrated.  Which she admits she does not drink enough water/liquids during the day -Patient advised to contine on her stool softener and miralax as needed  FiberCon and Metamucil Fiber as first line along with water as the first line and then trying second line medications of the stool softener and miralax.   -Discussed that increasing exercise will help with increasing blood flow to the gut and hence better colonic motility   Urinary Incontinence:   -Will prescribe Detrol LA to aid with the patient's incontinence.     -Myrbetriq was tried in the past and worked great but patient could not afford.    -Will refer the patient to Urology for further evaluation if needed in the future    Nail abnormalities: -Noted longitudinal nail ridges, patient denies chronic trauma, however, she notes that she excessively cleans her nails, and has been cleaning her silver incessantly over the past several weeks.  -Explained this could be from age-related changes /a normal aging process that occurs to the nail bed or due to  trauma, or malnourishment or poor hydration etc.    -Unfortunately, various tests are not covered with the patient's insurance for the lab work I wish to get to further investigate her lab abnormalities.  It was discussed with  the patient and noted that the patient may have to pay out of pocket for the labs, to which the patient will find out cost and decide if she would like to do so in future.    Rhinorrhea:  -Advised the patient to began using AYR or Neilmed sinus rinses BID first, then utilize flonase BID as needed (one spray to each nostril). Advised that the patient may also incorporate allegra or claritin PRN.    Follow later this week up for labs. Follow up in 4 months for chronic OV.   Orders Placed This Encounter  Procedures  . CBC with Differential/Platelet  . Vitamin B12  . TSH  . T4, free  . T3, free  . Folate  . Iron and TIBC  . Vitamin B1  . Vitamin A    Meds ordered this encounter  Medications  . tolterodine (DETROL LA) 2 MG 24 hr capsule    Sig: Take 1 capsule (2 mg total) by mouth daily.    Dispense:  90 capsule    Refill:  3    Medications Discontinued During This Encounter  Medication Reason  . GLUTAMINE PO Patient Preference  . lidocaine (LIDODERM) 5 % Completed Course  . Multiple Vitamin (MULTIVITAMIN) tablet Patient Preference  . omeprazole (PRILOSEC) 20 MG capsule No longer needed (for PRN medications)  . oxybutynin (DITROPAN-XL) 10 MG 24 hr tablet Completed Course  . TURMERIC PO Patient Preference  Gross side effects, risk and benefits, and alternatives of medications and treatment plan in general discussed with patient.  Patient is aware that all medications have potential side effects and we are unable to predict every side effect or drug-drug interaction that may occur.   Patient will call with any questions prior to using medication if they have concerns.    Expresses verbal understanding and consents to current therapy and treatment regimen.  No barriers to understanding were identified.  Red flag symptoms and signs discussed in detail.  Patient expressed understanding regarding what to do in case of emergency\urgent symptoms  Please see AVS handed out to  patient at the end of our visit for further patient instructions/ counseling done pertaining to today's office visit.   Return for 4 months for chronic constipation, fingernail abnormalities,.     Note:  This note was prepared with assistance of Dragon voice recognition software. Occasional wrong-word or sound-a-like substitutions may have occurred due to the inherent limitations of voice recognition software.   This document serves as a record of services personally performed by [name], DO. It was created on her behalf by Steva Colder, a trained medical scribe. The creation of this record is based on the scribe's personal observations and the provider's statements to them.   I have reviewed the above medical documentation for accuracy and completeness and I concur.  [name], DO, D.O. 06/08/2018 6:22 PM        --------------------------------------------------------------------------------------------------------------------------------------------------------------------------------------------------------------------------------------------    Subjective:     HPI: Cheryl Martin is a 84 y.o. female who presents to [place] at [facility] today for issues as discussed below.  She has been doing well overall. She has been doing well since her recent left knee replacement and she had tele physical therapy.    Chronic Constipation:  She takes the stool softener every other day along with miralax which has alleviated her constipation. She hasn't consumed a lot of water recently, however, she has a water filter now and will start to consume more water.   Worsening incontinence, Urgency: -She notes that her urinary incontinence has worsened and she has had urgency. She was previously evaluated by an Dealer at [employer]. She has been on Myrbetriq before for her symptoms and it alleviated her symptoms, however, she was unable to afford it.  -Dr.  Carlis Abbott is her endocrinologist and she was previously being evaluated for her thyroid.   Increased rhinorrhea:  She notes that she recently expanded on her home and notes that she had an increase in dust of her home prior to the onset of her rhinorrhea. She is not taking any medications for this. She has tried a sinus rinse in the past.   Advised the patient to utilize a room purifier to aid with symptoms of rhinorrhea.   Fingernails:  Splitting fingernails x 2-3 weeks. She notes that she has a habit of cleaning her fingernails. She denies trauma to her hands prior to the onset of her symptoms.   She consumes a lot of veggies because she is a vegan. She has been exercising every Thursday when she is able to do so.     Wt Readings from Last 3 Encounters:  06/08/18 137 lb 3.2 oz (62.2 kg)  04/29/18 137 lb (62.1 kg)  10/02/17 140 lb (63.5 kg)   BP Readings from Last 3 Encounters:  06/08/18 121/75  04/29/18 110/60  10/02/17 (!) 114/56   Pulse Readings from Last 3 Encounters:  06/08/18 71  04/29/18 68  10/02/17 64   BMI Readings from Last 3 Encounters:  06/08/18 23.55 kg/m  04/29/18 23.70 kg/m  10/02/17 24.03 kg/m     Patient Care Team    Relationship Specialty Notifications Start End  [name], DO PCP - General Family Medicine  02/20/16   Elsie Saas, MD Consulting Physician Orthopedic Surgery  04/19/15   Foye Spurling, MD Consulting Physician Endocrinology  02/21/16   Zadie Rhine, Clent Demark, MD Consulting Physician Ophthalmology  02/21/16   Harriett Sine, MD Consulting Physician Dermatology  03/14/16   Angelia Mould, MD Consulting Physician Vascular Surgery  04/11/16   [name], MD Consulting Physician Orthopedic Surgery  26/71/24   Jodi Marble, MD Consulting Physician Otolaryngology  09/03/17   Jolene Provost, PA-C Physician Assistant Physician Assistant  09/03/17    Comment: ENT  Armbruster, Carlota Raspberry, MD Consulting Physician Gastroenterology   04/17/18      Patient Active Problem List   Diagnosis Date Noted  . Urgency incontinence 05/24/2016    Priority: High  . h/o Hyperlipidemia, mild 02/24/2016    Priority: High  . Vitamin D deficiency 02/22/2016    Priority: High  . ADD (attention deficit disorder) 02/19/2011    Priority: High  . Vegan diet 06/08/2018    Priority: Medium  . h/o low B12- on supp 02/24/2016    Priority: Medium  . thyroid disease- at time did not warrant txmnt 02/19/2011    Priority: Medium  . Chronic constipation 06/03/2017    Priority: Low  . Patient has healthcare proxy and living will 07/20/2016    Priority: Low  . OA (osteoarthritis) of knee 04/15/2016    Priority: Low  . Status post hysterectomy  02/21/2016    Priority: Low  . Nail abnormalities-  longitudinal nail ridges 06/08/2018  . Family history of lung cancer- brother 09/03/2017  . Family history of stomach cancer and colon 09/03/2017  . 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 Hoarseness of voice 08/18/2017  . Blood in the stool 06/03/2017  . Incidental lung nodule-7 mm right middle lobe subpleural nodule 06/03/2017  . Urinary incontinence 05/23/2017  . Preoperative general physical examination 07/20/2016  . Chronic pain of left knee 04/11/2016  . Senile solar keratosis 03/14/2016  . Peripheral edema- bilateral lower extremities 03/14/2016  . ETD- Sx of L ear 03/14/2016  . Gen chronic Fatigue 02/24/2016  . Decreased hearing 02/19/2011    Past Medical history, Surgical history, Family history, Social history, Allergies and Medications have been entered into the medical record, reviewed and changed as needed.    Current Meds  Medication Sig  . aspirin EC 81 MG tablet Take 81 mg by mouth daily.  . Biotin 1000 MCG tablet Take 1,000 mcg daily by mouth.  . Cholecalciferol (VITAMIN D PO) Take 1 capsule daily by mouth.  . folic acid (FOLVITE) 1 MG tablet Take 1 mg daily by mouth.  Marland Kitchen GARCINIA CAMBOGIA-CHROMIUM PO Take 3 tablets by mouth 2  (two) times daily.   . Polyethylene Glycol 3350 (MIRALAX PO) Take daily by mouth.  . vitamin C (ASCORBIC ACID) 500 MG tablet Take 500 mg daily by mouth.    Allergies:  Allergies  Allergen Reactions  . Food     Patient is a vegetarian (does eat FISH)  . Tape Other (See Comments)    SKIN TEARS PATIENT TOLERATES PAPER TAPE SKIN TEARS PATIENT TOLERATES PAPER TAPE     Review of Systems:  A fourteen system review of systems was  performed and found to be positive as per HPI.   Objective:   Blood pressure 121/75, pulse 71, temperature 98.4 F (36.9 C), height 5\' 4"  (1.626 m), weight 137 lb 3.2 oz (62.2 kg), SpO2 98 %. Body mass index is 23.55 kg/m. General:  Well Developed, well nourished, appropriate for stated age.  Neuro:  Alert and oriented,  extra-ocular muscles intact  HEENT:  Normocephalic, atraumatic, neck supple, no carotid bruits appreciated  Skin:  no gross rash, warm, pink. Cardiac:  RRR, S1 S2 Respiratory:  ECTA B/L and A/P, Not using accessory muscles, speaking in full sentences- unlabored. Vascular:  Ext warm, no cyanosis apprec.; cap RF less 2 sec. Psych:  No HI/SI, judgement and insight good, Euthymic mood. Full Affect.

## 2018-06-08 NOTE — Patient Instructions (Addendum)
Why are longitudinal ridges formed? The most common reason for the formation of the vertical or longitudinal ridges in the absence of an actual disease is the lack of moisture and improper nutrition. As the nails age their capacity to absorb nutrients diminishes and this naturally affects their growth. The vertical ridges often form in aging nails. They are not a reason for alarm. However in some cases the longitudinal ridging may be the indication of underlying health issues.   Are ridged nails getting in the way of your wonderfully manicured look?  Ridged Fingernails - what can we do about them?  Although, nails do not contain any blood, nail issues such as ridged nails can indicate the presence of an underlying disorder.  We all know that ridges in fingernails are bumpy, elevated lines forming either vertically or horizontally.  When we refer to vertical ridges, we mean the ones running from the base of the nail bed to the tip of the nail.  Such ridges can change the appearance of the fingers, however these finger nail abnormalities are not painful and may go unnoticed.      Vertical ridges can be caused by multiple reasons such as below:  1.  Poor Diet - If a person's diet is lacking essential vitamins and minerals, they might experience ridged nails.  Therefore, ridged fingernails may indicate that the body is not getting its daily dosage of vitamins.  Your diet should include adequate amounts of Vitamin A, B, iron and protein deficiencies can trigger nail ridging.   2.  Poor Nutrient Absorption - Ridged fingernails is often the result of the body's inability to properly absorb nutrients from the food we eat, in which case it would be recommendable to consult a doctor to find out the cause.  3.  Loss of Moisture - As we grow older, the nail plate loses its moisture and natural oils, which often results in ridged fingernails.  Unfortunately, the aging process always has a negative effect  on our nails.  It is therefore no wonder that we find elderly people with ridged, cracked fingernails.  4.  Nail Injury or Fungal Infection - Either could be a possible trigger for the formation of ridged fingernails.  If you have sustained an injury to the nail bed, this could produce ridges, which may remain for a very long time.    Horizontal ridges should never be overlooked!  People having horizontal ridges often experience a change in their nail colour, which is an indication of a potentially serious health problem.  Be sure to make an appointment with your doctor if you have or think you may have horizontal ridges.    What can we do about ridged nails?  The easiest thing is to start eating a nutritious diet.  Assess your food intake and ensure that you are providing your body (and nails) with all the necessary vitamins, minerals and iron it requires.  This will be a huge step in keeping the ridges at [place]. Always be sure you are drinking enough water.  As you read above, dehydration is also a possible cause for ridged fingernails. A lack of moisture is often the primary cause behind ridged nails, in which case moisturizing creams applied daily would be beneficial.       Nail Bed Injury The nail bed is the soft tissue under a fingernail or toenail that is the origin of new nail growth. Various types of injuries can occur at the nail bed.  These injuries may involve bruising or bleeding under the nail, cuts (lacerations) in the nail or nail bed, or loss of a part of the nail or the whole nail (avulsion). In some cases, a nail bed injury accompanies another injury, such as a break (fracture) of the bone at the tip of the finger or toe. The nail bed includes the growth center of the nail. If this growth center is damaged, the injured nail may not grow back normally, or it may not grow at all. The regrown nail might have an abnormal shape or appearance. It can take several months for a damaged or  torn-off nail to regrow. Depending on the nature and extent of the nail bed injury, there may be a permanent disruption of normal nail growth. What are the causes? This condition is usually caused by crushing, pinching, cutting, or tearing injuries of the fingertip or toe. These injuries may occur when a finger or toe gets caught in a door, hit by a hammer, or damaged in accidents involving electrical tools or power machinery. What are the signs or symptoms? Symptoms may vary depending on the type of injury. Symptoms may include:  Pain in the injured area.  Bleeding.  Swelling.  Discoloration.  Collection of blood under the nail (hematoma).  Deformed or split nail.  A loose nail that is not stuck to the nail bed.  Loss of all or part of the nail.  How is this diagnosed? This condition is diagnosed based on:  Your medical history. You will be asked how the injury occurred.  A physical exam. Your health care provider will see if your nail is loose or if there is a laceration of the nail bed.  X-rays may be done to see if you have a fracture.  Your health care provider might also check for conditions that may affect healing, such as diabetes, nerve problems, or poor circulation. How is this treated? Treatment for this condition may depend on the type of injury. Sometimes, the injury may not require any treatment other than keeping the area clean and free of infection. Treatment may include:  Draining the collection of blood from under the nail. This can be done by making a small hole in the nail.  Removing all or part of your nail. This might be necessary in order to stitch (suture) any laceration in the nail bed.  Depending on the location and size of the nail bed injury, a torn off (avulsed) nail is sometimes stitched back in place to provide temporary protection to the nail bed until the new nail grows in.  Applying bandages (dressings) or splints to the area.  Antibiotic  medicine to help prevent infection.  Pain medicine.  Receiving a tetanus shot. You may need a tetanus shot if: ? You cannot remember when you had your last tetanus shot. ? You have never had a tetanus shot. ? The injury broke your skin and you have not had a tetanus booster during the past 10 years.  For certain injuries, your health care provider may direct you to see a hand or foot specialist. Follow these instructions at home: Managing pain, stiffness, and swelling  Raise (elevate) the injured area above the level of your heart while you are sitting or lying down.  Keep your injury protected with dressings or splints as told by your health care provider.  Keep any dressings clean and dry. Change or remove your dressings only as told by your health care provider.  For an injured toenail: ? Limit walking on your injured leg. ? Wear an open-toed shoe when you walk. ? Try to avoid letting your leg hang down (dangle) while you are sitting or lying down. General instructions  Take over-the-counter and prescription medicines only as told by your health care provider.  If you were prescribed an antibiotic medicine, use it as told by your health care provider. Do not stop using the antibiotic even if you start to feel better.  Do not drive or use heavy machinery while taking prescription pain medicine.  Keep all follow-up visits as told by your health care provider. This is important. Contact a health care provider if:  You have pain that is not controlled with medicine.  You have more pain, drainage, or bleeding in the injured area.  You have redness, soreness, and swelling in the injured area.  You have a fever and your symptoms get worse. Get help right away if:  You have numbness or a blue discoloration of your finger or toe. Summary  The nail bed is the soft tissue under a fingernail or toenail that includes the growth center of the nail. If this growth center is damaged,  the injured nail may not grow back normally, or it may not grow at all.  Raise (elevate) the injured area above the level of your heart while you are sitting or lying down.  Keep any dressings clean and dry. Change or remove your dressings only as told by your health care provider.  Take over-the-counter and prescription medicines only as told by your health care provider. This information is not intended to replace advice given to you by your health care provider. Make sure you discuss any questions you have with your health care provider. Document Released: 08/15/2004 Document Revised: 05/29/2016 Document Reviewed: 05/29/2016 Elsevier Interactive Patient Education  2017 [employer].

## 2018-06-09 ENCOUNTER — Telehealth: Payer: Self-pay | Admitting: Family Medicine

## 2018-06-09 DIAGNOSIS — M5136 Other intervertebral disc degeneration, lumbar region: Secondary | ICD-10-CM | POA: Diagnosis not present

## 2018-06-09 DIAGNOSIS — M9903 Segmental and somatic dysfunction of lumbar region: Secondary | ICD-10-CM | POA: Diagnosis not present

## 2018-06-09 DIAGNOSIS — M9901 Segmental and somatic dysfunction of cervical region: Secondary | ICD-10-CM | POA: Diagnosis not present

## 2018-06-09 DIAGNOSIS — M6240 Contracture of muscle, unspecified site: Secondary | ICD-10-CM | POA: Diagnosis not present

## 2018-06-09 NOTE — Telephone Encounter (Signed)
Patient called states she can not afford this Rx :  ( it cost $$180.00 )  tolterodine (DETROL LA) 2 MG 24 hr capsule [[id number]]   Order Details  Dose: 2 mg Route: Oral Frequency: Daily  Dispense Quantity: 90 capsule Refills: 3 Fills remaining: --        Sig: Take 1 capsule (2 mg total) by mouth daily.          ----Forwarding request to medical assistant to review w/ provider for a cheaper Rx replacement.  --glh

## 2018-06-09 NOTE — Telephone Encounter (Signed)
 -  We can try Ditropan XL.  Start with 5 mg daily.  She can increase by 5 mg every 7 days but not sooner.  Max milligram per day for now would be 15 mg daily.   Dispense 90, no refill  -  Once she is at 15 mg daily for at least 2 weeks, have her follow-up with me which should be in 1 month so I can see how treatment is going.  -And honestly, if her insurance does not cover the above, she will really need to call her insurance and see which urinary incontinence medicines are cheapest for her to buy

## 2018-06-09 NOTE — Telephone Encounter (Signed)
Patient states that medication is too expensive. Please advise if there is something that this can be changed to.  LOV 06/08/2018. MPulliam, CMA/RT(R)

## 2018-06-10 MED ORDER — OXYBUTYNIN CHLORIDE ER 5 MG PO TB24: 5.0000 mg | ORAL_TABLET | Freq: Every day | ORAL | 0 refills | Status: DC

## 2018-06-10 NOTE — Addendum Note (Signed)
Addended by: Lanier Prude D on: 06/10/2018 04:51 PM   Modules accepted: Orders

## 2018-06-10 NOTE — Telephone Encounter (Signed)
Printed and faxed pharmacy.  Patient notified. MPulliam, CMA/RT(R)

## 2018-06-11 ENCOUNTER — Other Ambulatory Visit (INDEPENDENT_AMBULATORY_CARE_PROVIDER_SITE_OTHER): Payer: Medicare Other

## 2018-06-11 DIAGNOSIS — L609 Nail disorder, unspecified: Secondary | ICD-10-CM | POA: Diagnosis not present

## 2018-06-11 DIAGNOSIS — N3941 Urge incontinence: Secondary | ICD-10-CM

## 2018-06-11 DIAGNOSIS — M5136 Other intervertebral disc degeneration, lumbar region: Secondary | ICD-10-CM | POA: Diagnosis not present

## 2018-06-11 DIAGNOSIS — E559 Vitamin D deficiency, unspecified: Secondary | ICD-10-CM

## 2018-06-11 DIAGNOSIS — K5909 Other constipation: Secondary | ICD-10-CM

## 2018-06-11 DIAGNOSIS — Z862 Personal history of diseases of the blood and blood-forming organs and certain disorders involving the immune mechanism: Secondary | ICD-10-CM | POA: Diagnosis not present

## 2018-06-11 DIAGNOSIS — M9903 Segmental and somatic dysfunction of lumbar region: Secondary | ICD-10-CM | POA: Diagnosis not present

## 2018-06-11 DIAGNOSIS — E785 Hyperlipidemia, unspecified: Secondary | ICD-10-CM

## 2018-06-11 DIAGNOSIS — E079 Disorder of thyroid, unspecified: Secondary | ICD-10-CM | POA: Diagnosis not present

## 2018-06-11 DIAGNOSIS — E538 Deficiency of other specified B group vitamins: Secondary | ICD-10-CM | POA: Diagnosis not present

## 2018-06-11 DIAGNOSIS — Z789 Other specified health status: Secondary | ICD-10-CM

## 2018-06-11 DIAGNOSIS — M9901 Segmental and somatic dysfunction of cervical region: Secondary | ICD-10-CM | POA: Diagnosis not present

## 2018-06-11 DIAGNOSIS — M6240 Contracture of muscle, unspecified site: Secondary | ICD-10-CM | POA: Diagnosis not present

## 2018-06-16 DIAGNOSIS — M9903 Segmental and somatic dysfunction of lumbar region: Secondary | ICD-10-CM | POA: Diagnosis not present

## 2018-06-16 DIAGNOSIS — M5136 Other intervertebral disc degeneration, lumbar region: Secondary | ICD-10-CM | POA: Diagnosis not present

## 2018-06-16 DIAGNOSIS — M9901 Segmental and somatic dysfunction of cervical region: Secondary | ICD-10-CM | POA: Diagnosis not present

## 2018-06-16 DIAGNOSIS — M6240 Contracture of muscle, unspecified site: Secondary | ICD-10-CM | POA: Diagnosis not present

## 2018-06-17 LAB — VITAMIN B1: Thiamine: 65 nmol/L — ABNORMAL LOW (ref 66.5–200.0)

## 2018-06-17 LAB — IRON AND TIBC
IRON SATURATION: 33 % (ref 15–55)
IRON: 117 ug/dL (ref 27–139)
Total Iron Binding Capacity: 354 ug/dL (ref 250–450)
UIBC: 237 ug/dL (ref 118–369)

## 2018-06-17 LAB — CBC WITH DIFFERENTIAL/PLATELET
BASOS ABS: 0 10*3/uL (ref 0.0–0.2)
Basos: 1 %
EOS (ABSOLUTE): 0.1 10*3/uL (ref 0.0–0.4)
EOS: 2 %
Hematocrit: 34.4 % (ref 34.0–46.6)
Hemoglobin: 12.3 g/dL (ref 11.1–15.9)
Immature Grans (Abs): 0 10*3/uL (ref 0.0–0.1)
Immature Granulocytes: 0 %
LYMPHS ABS: 1.1 10*3/uL (ref 0.7–3.1)
Lymphs: 22 %
MCH: 31.8 pg (ref 26.6–33.0)
MCHC: 35.8 g/dL — AB (ref 31.5–35.7)
MCV: 89 fL (ref 79–97)
MONOS ABS: 0.4 10*3/uL (ref 0.1–0.9)
Monocytes: 9 %
NEUTROS ABS: 3.3 10*3/uL (ref 1.4–7.0)
Neutrophils: 66 %
Platelets: 236 10*3/uL (ref 150–450)
RBC: 3.87 x10E6/uL (ref 3.77–5.28)
RDW: 12.4 % (ref 12.3–15.4)
WBC: 5 10*3/uL (ref 3.4–10.8)

## 2018-06-17 LAB — VITAMIN A: VITAMIN A: 40.9 ug/dL (ref 22.0–69.5)

## 2018-06-17 LAB — FOLATE: Folate: >20 ng/mL (ref >3.0)

## 2018-06-17 LAB — T3, FREE: T3 FREE: 2.7 pg/mL (ref 2.0–4.4)

## 2018-06-17 LAB — TSH: TSH: 3.59 u[IU]/mL (ref 0.450–4.500)

## 2018-06-17 LAB — VITAMIN B12: VITAMIN B 12: 545 pg/mL (ref 232–1245)

## 2018-06-17 LAB — T4, FREE: FREE T4: 1.39 ng/dL (ref 0.82–1.77)

## 2018-06-25 DIAGNOSIS — M5136 Other intervertebral disc degeneration, lumbar region: Secondary | ICD-10-CM | POA: Diagnosis not present

## 2018-06-25 DIAGNOSIS — M9903 Segmental and somatic dysfunction of lumbar region: Secondary | ICD-10-CM | POA: Diagnosis not present

## 2018-06-25 DIAGNOSIS — M6240 Contracture of muscle, unspecified site: Secondary | ICD-10-CM | POA: Diagnosis not present

## 2018-06-25 DIAGNOSIS — M9901 Segmental and somatic dysfunction of cervical region: Secondary | ICD-10-CM | POA: Diagnosis not present

## 2018-06-29 ENCOUNTER — Telehealth: Payer: Self-pay | Admitting: Family Medicine

## 2018-06-29 NOTE — Telephone Encounter (Signed)
Patient was called by Kenney Houseman last week regarding test results, patient is returning that call but no CMA was available at the time. Please contact patient back when available

## 2018-06-29 NOTE — Telephone Encounter (Signed)
Pt informed of results.  Pt expressed understanding and is agreeable.  T. Brendon Christoffel, CMA 

## 2018-06-29 NOTE — Telephone Encounter (Signed)
Thiamine was obtained recently along with the other labs. It specifically was in the low normal range.  I recommend patient continue with her supplements and just try to eat thiamine rich foods.

## 2018-06-29 NOTE — Telephone Encounter (Signed)
Patient called wanting her thiamine results, however I do not see that a thiamine was ordered.  Please advise.  Charyl Bigger, CMA

## 2018-07-02 DIAGNOSIS — M9903 Segmental and somatic dysfunction of lumbar region: Secondary | ICD-10-CM | POA: Diagnosis not present

## 2018-07-02 DIAGNOSIS — M9901 Segmental and somatic dysfunction of cervical region: Secondary | ICD-10-CM | POA: Diagnosis not present

## 2018-07-02 DIAGNOSIS — M6240 Contracture of muscle, unspecified site: Secondary | ICD-10-CM | POA: Diagnosis not present

## 2018-07-02 DIAGNOSIS — M5136 Other intervertebral disc degeneration, lumbar region: Secondary | ICD-10-CM | POA: Diagnosis not present

## 2018-07-16 ENCOUNTER — Ambulatory Visit (INDEPENDENT_AMBULATORY_CARE_PROVIDER_SITE_OTHER): Payer: Medicare Other | Admitting: Family Medicine

## 2018-07-16 ENCOUNTER — Encounter: Payer: Self-pay | Admitting: Family Medicine

## 2018-07-16 VITALS — BP 128/79 | HR 66 | Temp 97.9°F | Ht 64.0 in | Wt 137.6 lb

## 2018-07-16 DIAGNOSIS — Z0000 Encounter for general adult medical examination without abnormal findings: Secondary | ICD-10-CM | POA: Diagnosis not present

## 2018-07-16 DIAGNOSIS — Z0100 Encounter for examination of eyes and vision without abnormal findings: Secondary | ICD-10-CM

## 2018-07-16 DIAGNOSIS — E2839 Other primary ovarian failure: Secondary | ICD-10-CM | POA: Diagnosis not present

## 2018-07-16 DIAGNOSIS — Z23 Encounter for immunization: Secondary | ICD-10-CM

## 2018-07-16 MED ORDER — ZOSTER VAC RECOMB ADJUVANTED 50 MCG/0.5ML IM SUSR: 0.5000 mL | Freq: Once | INTRAMUSCULAR | 0 refills | Status: AC
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
## 2018-07-16 NOTE — Patient Instructions (Signed)
-We have put in the order for your bone density as well as your repeat CT chest.  If you do not hear within a week about this, please call the office and speak with Dorothea Ogle.    Also the Shingrix vaccine was sent to your pharmacy so please read the information and let me know if you have any questions prior to getting it  -Also please read the information about normal versus abnormal memory and aging and if you have any further concerns and would like to see neurology for further evaluation, I am happy to do so so just let us know.   Also please contact [facility] about possibly getting evaluated for hearing aids.  Let us know if you have any further concerns or questions about this.    Preventive Care for Adults, Female A healthy lifestyle and preventive care can promote health and wellness. Preventive health guidelines for women include the following key practices.   A routine yearly physical is a good way to check with your health care provider about your health and preventive screening. It is a chance to share any concerns and updates on your health and to receive a thorough exam.   Visit your dentist for a routine exam and preventive care every 6 months. Brush your teeth twice a day and floss once a day. Good oral hygiene prevents tooth decay and gum disease.   The frequency of eye exams is based on your age, health, family medical history, use of contact lenses, and other factors. Follow your health care provider's recommendations for frequency of eye exams.   Eat a healthy diet. Foods like vegetables, fruits, whole grains, low-fat dairy products, and lean protein foods contain the nutrients you need without too many calories. Decrease your intake of foods high in solid fats, added sugars, and salt. Eat the right amount of calories for you.Get information about a proper diet from your health care provider, if necessary.   Regular physical exercise is one of the most important  things you can do for your health. Most adults should get at least 150 minutes of moderate-intensity exercise (any activity that increases your heart rate and causes you to sweat) each week. In addition, most adults need muscle-strengthening exercises on 2 or more days a week.   Maintain a healthy weight. The body mass index (BMI) is a screening tool to identify possible weight problems. It provides an estimate of body fat based on height and weight. Your health care provider can find your BMI, and can help you achieve or maintain a healthy weight.For adults 20 years and older:   - A BMI below 18.5 is considered underweight.   - A BMI of 18.5 to 24.9 is normal.   - A BMI of 25 to 29.9 is considered overweight.   - A BMI of 30 and above is considered obese.   Maintain normal blood lipids and cholesterol levels by exercising and minimizing your intake of trans and saturated fats.  Eat a balanced diet with plenty of fruit and vegetables. Blood tests for lipids and cholesterol should begin at age 28 and be repeated every 5 years minimum.  If your lipid or cholesterol levels are high, you are over 40, or you are at high risk for heart disease, you may need your cholesterol levels checked more frequently.Ongoing high lipid and cholesterol levels should be treated with medicines if diet and exercise are not working.   If you smoke, find out from your  health care provider how to quit. If you do not use tobacco, do not start.   Lung cancer screening is recommended for adults aged 54-80 years who are at high risk for developing lung cancer because of a history of smoking. A yearly low-dose CT scan of the lungs is recommended for people who have at least a 30-pack-year history of smoking and are a current smoker or have quit within the past 15 years. A pack year of smoking is smoking an average of 1 pack of cigarettes a day for 1 year (for example: 1 pack a day for 30 years or 2 packs a day for 15 years).  Yearly screening should continue until the smoker has stopped smoking for at least 15 years. Yearly screening should be stopped for people who develop a health problem that would prevent them from having lung cancer treatment.   If you are pregnant, do not drink alcohol. If you are breastfeeding, be very cautious about drinking alcohol. If you are not pregnant and choose to drink alcohol, do not have more than 1 drink per day. One drink is considered to be 12 ounces (355 mL) of beer, 5 ounces (148 mL) of wine, or 1.5 ounces (44 mL) of liquor.   Avoid use of street drugs. Do not share needles with anyone. Ask for help if you need support or instructions about stopping the use of drugs.   High blood pressure causes heart disease and increases the risk of stroke. Your blood pressure should be checked at least yearly.  Ongoing high blood pressure should be treated with medicines if weight loss and exercise do not work.   If you are 65-53 years old, ask your health care provider if you should take aspirin to prevent strokes.   Diabetes screening involves taking a blood sample to check your fasting blood sugar level. This should be done once every 3 years, after age 63, if you are within normal weight and without risk factors for diabetes. Testing should be considered at a younger age or be carried out more frequently if you are overweight and have at least 1 risk factor for diabetes.   Breast cancer screening is essential preventive care for women. You should practice "breast self-awareness."  This means understanding the normal appearance and feel of your breasts and may include breast self-examination.  Any changes detected, no matter how small, should be reported to a health care provider.  Women in their 13s and 30s should have a clinical breast exam (CBE) by a health care provider as part of a regular health exam every 1 to 3 years.  After age 20, women should have a CBE every year.  Starting at  age 71, women should consider having a mammogram (breast X-ray test) every year.  Women who have a family history of breast cancer should talk to their health care provider about genetic screening.  Women at a high risk of breast cancer should talk to their health care providers about having an MRI and a mammogram every year.   -Breast cancer gene (BRCA)-related cancer risk assessment is recommended for women who have family members with BRCA-related cancers. BRCA-related cancers include breast, ovarian, tubal, and peritoneal cancers. Having family members with these cancers may be associated with an increased risk for harmful changes (mutations) in the breast cancer genes BRCA1 and BRCA2. Results of the assessment will determine the need for genetic counseling and BRCA1 and BRCA2 testing.   The Pap test is a screening  test for cervical cancer. A Pap test can show cell changes on the cervix that might become cervical cancer if left untreated. A Pap test is a procedure in which cells are obtained and examined from the lower end of the uterus (cervix).   - Women should have a Pap test starting at age 62.   - Between ages 20 and 38, Pap tests should be repeated every 2 years.   - Beginning at age 29, you should have a Pap test every 3 years as long as the past 3 Pap tests have been normal.   - Some women have medical problems that increase the chance of getting cervical cancer. Talk to your health care provider about these problems. It is especially important to talk to your health care provider if a new problem develops soon after your last Pap test. In these cases, your health care provider may recommend more frequent screening and Pap tests.   - The above recommendations are the same for women who have or have not gotten the vaccine for human papillomavirus (HPV).   - If you had a hysterectomy for a problem that was not cancer or a condition that could lead to cancer, then you no longer need Pap  tests. Even if you no longer need a Pap test, a regular exam is a good idea to make sure no other problems are starting.   - If you are between ages 20 and 39 years, and you have had normal Pap tests going back 10 years, you no longer need Pap tests. Even if you no longer need a Pap test, a regular exam is a good idea to make sure no other problems are starting.   - If you have had past treatment for cervical cancer or a condition that could lead to cancer, you need Pap tests and screening for cancer for at least 20 years after your treatment.   - If Pap tests have been discontinued, risk factors (such as a new sexual partner) need to be reassessed to determine if screening should be resumed.   - The HPV test is an additional test that may be used for cervical cancer screening. The HPV test looks for the virus that can cause the cell changes on the cervix. The cells collected during the Pap test can be tested for HPV. The HPV test could be used to screen women aged 18 years and older, and should be used in women of any age who have unclear Pap test results. After the age of 46, women should have HPV testing at the same frequency as a Pap test.   Colorectal cancer can be detected and often prevented. Most routine colorectal cancer screening begins at the age of 18 years and continues through age 49 years. However, your health care provider may recommend screening at an earlier age if you have risk factors for colon cancer. On a yearly basis, your health care provider may provide home test kits to check for hidden blood in the stool.  Use of a small camera at the end of a tube, to directly examine the colon (sigmoidoscopy or colonoscopy), can detect the earliest forms of colorectal cancer. Talk to your health care provider about this at age 42, when routine screening begins. Direct exam of the colon should be repeated every 5 -10 years through age 30 years, unless early forms of pre-cancerous polyps or  small growths are found.   People who are at an increased risk for hepatitis  B should be screened for this virus. You are considered at high risk for hepatitis B if:  -You were born in a country where hepatitis B occurs often. Talk with your health care provider about which countries are considered high risk.  - Your parents were born in a high-risk country and you have not received a shot to protect against hepatitis B (hepatitis B vaccine).  - You have HIV or AIDS.  - You use needles to inject street drugs.  - You live with, or have sex with, someone who has Hepatitis B.  - You get hemodialysis treatment.  - You take certain medicines for conditions like cancer, organ transplantation, and autoimmune conditions.   Hepatitis C blood testing is recommended for all people born from [birth year] through 1965 and any individual with known risks for hepatitis C.   Practice safe sex. Use condoms and avoid high-risk sexual practices to reduce the spread of sexually transmitted infections (STIs). STIs include gonorrhea, chlamydia, syphilis, trichomonas, herpes, HPV, and human immunodeficiency virus (HIV). Herpes, HIV, and HPV are viral illnesses that have no cure. They can result in disability, cancer, and death. Sexually active women aged 24 years and younger should be checked for chlamydia. Older women with new or multiple partners should also be tested for chlamydia. Testing for other STIs is recommended if you are sexually active and at increased risk.   Osteoporosis is a disease in which the bones lose minerals and strength with aging. This can result in serious bone fractures or breaks. The risk of osteoporosis can be identified using a bone density scan. Women ages 61 years and over and women at risk for fractures or osteoporosis should discuss screening with their health care providers. Ask your health care provider whether you should take a calcium supplement or vitamin D to There are also several  preventive steps women can take to avoid osteoporosis and resulting fractures or to keep osteoporosis from worsening. -->Recommendations include:  Eat a balanced diet high in fruits, vegetables, calcium, and vitamins.  Get enough calcium. The recommended total intake of is 1,200 mg daily; for best absorption, if taking supplements, divide doses into 250-500 mg doses throughout the day. Of the two types of calcium, calcium carbonate is best absorbed when taken with food but calcium citrate can be taken on an empty stomach.  Get enough vitamin D. [employer] and the [place] recommend at least 1,000 IU per day for women age 33 and over who are at risk of vitamin D deficiency. Vitamin D deficiency can be caused by inadequate sun exposure (for example, those who live in [place]).  Avoid alcohol and smoking. Heavy alcohol intake (more than 7 drinks per week) increases the risk of falls and hip fracture and women smokers tend to lose bone more rapidly and have lower bone mass than nonsmokers. Stopping smoking is one of the most important changes women can make to improve their health and decrease risk for disease.  Be physically active every day. Weight-bearing exercise (for example, fast walking, hiking, jogging, and weight training) may strengthen bones or slow the rate of bone loss that comes with aging. Balancing and muscle-strengthening exercises can reduce the risk of falling and fracture.  Consider therapeutic medications. Currently, several types of effective drugs are available. Healthcare providers can recommend the type most appropriate for each woman.  Eliminate environmental factors that may contribute to accidents. Falls cause nearly 90% of all osteoporotic fractures, so reducing this risk is an important  bone-health strategy. Measures include ample lighting, removing obstructions to walking, using nonskid rugs on floors, and placing mats and/or grab bars in  showers.  Be aware of medication side effects. Some common medicines make bones weaker. These include a type of steroid drug called glucocorticoids used for arthritis and asthma, some antiseizure drugs, certain sleeping pills, treatments for endometriosis, and some cancer drugs. An overactive thyroid gland or using too much thyroid hormone for an underactive thyroid can also be a problem. If you are taking these medicines, talk to your doctor about what you can do to help protect your bones.reduce the rate of osteoporosis.    Menopause can be associated with physical symptoms and risks. Hormone replacement therapy is available to decrease symptoms and risks. You should talk to your health care provider about whether hormone replacement therapy is right for you.   Use sunscreen. Apply sunscreen liberally and repeatedly throughout the day. You should seek shade when your shadow is shorter than you. Protect yourself by wearing long sleeves, pants, a wide-brimmed hat, and sunglasses year round, whenever you are outdoors.   Once a month, do a whole body skin exam, using a mirror to look at the skin on your back. Tell your health care provider of new moles, moles that have irregular borders, moles that are larger than a pencil eraser, or moles that have changed in shape or color.   -Stay current with required vaccines (immunizations).   Influenza vaccine. All adults should be immunized every year.  Tetanus, diphtheria, and acellular pertussis (Td, Tdap) vaccine. Pregnant women should receive 1 dose of Tdap vaccine during each pregnancy. The dose should be obtained regardless of the length of time since the last dose. Immunization is preferred during the 27th 36th week of gestation. An adult who has not previously received Tdap or who does not know her vaccine status should receive 1 dose of Tdap. This initial dose should be followed by tetanus and diphtheria toxoids (Td) booster doses every 10 years.  Adults with an unknown or incomplete history of completing a 3-dose immunization series with Td-containing vaccines should begin or complete a primary immunization series including a Tdap dose. Adults should receive a Td booster every 10 years.  Varicella vaccine. An adult without evidence of immunity to varicella should receive 2 doses or a second dose if she has previously received 1 dose. Pregnant females who do not have evidence of immunity should receive the first dose after pregnancy. This first dose should be obtained before leaving the health care facility. The second dose should be obtained 4 8 weeks after the first dose.  Human papillomavirus (HPV) vaccine. Females aged 27 26 years who have not received the vaccine previously should obtain the 3-dose series. The vaccine is not recommended for use in pregnant females. However, pregnancy testing is not needed before receiving a dose. If a female is found to be pregnant after receiving a dose, no treatment is needed. In that case, the remaining doses should be delayed until after the pregnancy. Immunization is recommended for any person with an immunocompromised condition through the age of 74 years if she did not get any or all doses earlier. During the 3-dose series, the second dose should be obtained 4 8 weeks after the first dose. The third dose should be obtained 24 weeks after the first dose and 16 weeks after the second dose.  Zoster vaccine. One dose is recommended for adults aged 35 years or older unless certain conditions are present.  Measles, mumps, and rubella (MMR) vaccine. Adults born before [birth year] generally are considered immune to measles and mumps. Adults born in [birth year] or later should have 1 or more doses of MMR vaccine unless there is a contraindication to the vaccine or there is laboratory evidence of immunity to each of the three diseases. A routine second dose of MMR vaccine should be obtained at least 28 days after the first dose  for students attending postsecondary schools, health care workers, or international travelers. People who received inactivated measles vaccine or an unknown type of measles vaccine during 1963 1967 should receive 2 doses of MMR vaccine. People who received inactivated mumps vaccine or an unknown type of mumps vaccine before 1979 and are at high risk for mumps infection should consider immunization with 2 doses of MMR vaccine. For females of childbearing age, rubella immunity should be determined. If there is no evidence of immunity, females who are not pregnant should be vaccinated. If there is no evidence of immunity, females who are pregnant should delay immunization until after pregnancy. Unvaccinated health care workers born before [birth year] who lack laboratory evidence of measles, mumps, or rubella immunity or laboratory confirmation of disease should consider measles and mumps immunization with 2 doses of MMR vaccine or rubella immunization with 1 dose of MMR vaccine.  Pneumococcal 13-valent conjugate (PCV13) vaccine. When indicated, a person who is uncertain of her immunization history and has no record of immunization should receive the PCV13 vaccine. An adult aged 71 years or older who has certain medical conditions and has not been previously immunized should receive 1 dose of PCV13 vaccine. This PCV13 should be followed with a dose of pneumococcal polysaccharide (PPSV23) vaccine. The PPSV23 vaccine dose should be obtained at least 8 weeks after the dose of PCV13 vaccine. An adult aged 49 years or older who has certain medical conditions and previously received 1 or more doses of PPSV23 vaccine should receive 1 dose of PCV13. The PCV13 vaccine dose should be obtained 1 or more years after the last PPSV23 vaccine dose.  Pneumococcal polysaccharide (PPSV23) vaccine. When PCV13 is also indicated, PCV13 should be obtained first. All adults aged 50 years and older should be immunized. An adult younger than age  31 years who has certain medical conditions should be immunized. Any person who resides in a nursing home or long-term care facility should be immunized. An adult smoker should be immunized. People with an immunocompromised condition and certain other conditions should receive both PCV13 and PPSV23 vaccines. People with human immunodeficiency virus (HIV) infection should be immunized as soon as possible after diagnosis. Immunization during chemotherapy or radiation therapy should be avoided. Routine use of PPSV23 vaccine is not recommended for American Indians, [place] Natives, or people younger than 65 years unless there are medical conditions that require PPSV23 vaccine. When indicated, people who have unknown immunization and have no record of immunization should receive PPSV23 vaccine. One-time revaccination 5 years after the first dose of PPSV23 is recommended for people aged 6 64 years who have chronic kidney failure, nephrotic syndrome, asplenia, or immunocompromised conditions. People who received 1 2 doses of PPSV23 before age 37 years should receive another dose of PPSV23 vaccine at age 65 years or later if at least 5 years have passed since the previous dose. Doses of PPSV23 are not needed for people immunized with PPSV23 at or after age 85 years.  Meningococcal vaccine. Adults with asplenia or persistent complement component deficiencies should receive 2 doses of quadrivalent meningococcal  conjugate (MenACWY-D) vaccine. The doses should be obtained at least 2 months apart. Microbiologists working with certain meningococcal bacteria, [place] recruits, people at risk during an outbreak, and people who travel to or live in countries with a high rate of meningitis should be immunized. A first-year college student up through age 67 years who is living in a residence hall should receive a dose if she did not receive a dose on or after her 16th birthday. Adults who have certain high-risk conditions should  receive one or more doses of vaccine.  Hepatitis A vaccine. Adults who wish to be protected from this disease, have certain high-risk conditions, work with hepatitis A-infected animals, work in hepatitis A research labs, or travel to or work in countries with a high rate of hepatitis A should be immunized. Adults who were previously unvaccinated and who anticipate close contact with an international adoptee during the first 60 days after arrival in the [place] from a country with a high rate of hepatitis A should be immunized.  Hepatitis B vaccine.  Adults who wish to be protected from this disease, have certain high-risk conditions, may be exposed to blood or other infectious body fluids, are household contacts or sex partners of hepatitis B positive people, are clients or workers in certain care facilities, or travel to or work in countries with a high rate of hepatitis B should be immunized.  Haemophilus influenzae type b (Hib) vaccine. A previously unvaccinated person with asplenia or sickle cell disease or having a scheduled splenectomy should receive 1 dose of Hib vaccine. Regardless of previous immunization, a recipient of a hematopoietic stem cell transplant should receive a 3-dose series 6 12 months after her successful transplant. Hib vaccine is not recommended for adults with HIV infection.  Preventive Services / Frequency Ages 64 to 39years  Blood pressure check.** / Every 1 to 2 years.  Lipid and cholesterol check.** / Every 5 years beginning at age 58.  Clinical breast exam.** / Every 3 years for women in their 44s and 84s.  BRCA-related cancer risk assessment.** / For women who have family members with a BRCA-related cancer (breast, ovarian, tubal, or peritoneal cancers).  Pap test.** / Every 2 years from ages 39 through 71. Every 3 years starting at age 63 through age 56 or 73 with a history of 3 consecutive normal Pap tests.  HPV screening.** / Every 3 years from ages 75  through ages 56 to 60 with a history of 3 consecutive normal Pap tests.  Hepatitis C blood test.** / For any individual with known risks for hepatitis C.  Skin self-exam. / Monthly.  Influenza vaccine. / Every year.  Tetanus, diphtheria, and acellular pertussis (Tdap, Td) vaccine.** / Consult your health care provider. Pregnant women should receive 1 dose of Tdap vaccine during each pregnancy. 1 dose of Td every 10 years.  Varicella vaccine.** / Consult your health care provider. Pregnant females who do not have evidence of immunity should receive the first dose after pregnancy.  HPV vaccine. / 3 doses over 6 months, if 38 and younger. The vaccine is not recommended for use in pregnant females. However, pregnancy testing is not needed before receiving a dose.  Measles, mumps, rubella (MMR) vaccine.** / You need at least 1 dose of MMR if you were born in 1957 or later. You may also need a 2nd dose. For females of childbearing age, rubella immunity should be determined. If there is no evidence of immunity, females who are not pregnant  should be vaccinated. If there is no evidence of immunity, females who are pregnant should delay immunization until after pregnancy.  Pneumococcal 13-valent conjugate (PCV13) vaccine.** / Consult your health care provider.  Pneumococcal polysaccharide (PPSV23) vaccine.** / 1 to 2 doses if you smoke cigarettes or if you have certain conditions.  Meningococcal vaccine.** / 1 dose if you are age 59 to 67 years and a Market researcher living in a residence hall, or have one of several medical conditions, you need to get vaccinated against meningococcal disease. You may also need additional booster doses.  Hepatitis A vaccine.** / Consult your health care provider.  Hepatitis B vaccine.** / Consult your health care provider.  Haemophilus influenzae type b (Hib) vaccine.** / Consult your health care provider.  Ages 38 to 64years  Blood pressure check.** /  Every 1 to 2 years.  Lipid and cholesterol check.** / Every 5 years beginning at age 87 years.  Lung cancer screening. / Every year if you are aged 22 80 years and have a 30-pack-year history of smoking and currently smoke or have quit within the past 15 years. Yearly screening is stopped once you have quit smoking for at least 15 years or develop a health problem that would prevent you from having lung cancer treatment.  Clinical breast exam.** / Every year after age 63 years.  BRCA-related cancer risk assessment.** / For women who have family members with a BRCA-related cancer (breast, ovarian, tubal, or peritoneal cancers).  Mammogram.** / Every year beginning at age 7 years and continuing for as long as you are in good health. Consult with your health care provider.  Pap test.** / Every 3 years starting at age 36 years through age 58 or 29 years with a history of 3 consecutive normal Pap tests.  HPV screening.** / Every 3 years from ages 19 years through ages 67 to 66 years with a history of 3 consecutive normal Pap tests.  Fecal occult blood test (FOBT) of stool. / Every year beginning at age 35 years and continuing until age 26 years. You may not need to do this test if you get a colonoscopy every 10 years.  Flexible sigmoidoscopy or colonoscopy.** / Every 5 years for a flexible sigmoidoscopy or every 10 years for a colonoscopy beginning at age 66 years and continuing until age 66 years.  Hepatitis C blood test.** / For all people born from [birth year] through 1963 and any individual with known risks for hepatitis C.  Skin self-exam. / Monthly.  Influenza vaccine. / Every year.  Tetanus, diphtheria, and acellular pertussis (Tdap/Td) vaccine.** / Consult your health care provider. Pregnant women should receive 1 dose of Tdap vaccine during each pregnancy. 1 dose of Td every 10 years.  Varicella vaccine.** / Consult your health care provider. Pregnant females who do not have evidence of  immunity should receive the first dose after pregnancy.  Zoster vaccine.** / 1 dose for adults aged 49 years or older.  Measles, mumps, rubella (MMR) vaccine.** / You need at least 1 dose of MMR if you were born in 1957 or later. You may also need a 2nd dose. For females of childbearing age, rubella immunity should be determined. If there is no evidence of immunity, females who are not pregnant should be vaccinated. If there is no evidence of immunity, females who are pregnant should delay immunization until after pregnancy.  Pneumococcal 13-valent conjugate (PCV13) vaccine.** / Consult your health care provider.  Pneumococcal polysaccharide (PPSV23) vaccine.** / 1  to 2 doses if you smoke cigarettes or if you have certain conditions.  Meningococcal vaccine.** / Consult your health care provider.  Hepatitis A vaccine.** / Consult your health care provider.  Hepatitis B vaccine.** / Consult your health care provider.  Haemophilus influenzae type b (Hib) vaccine.** / Consult your health care provider.  Ages 34 years and over  Blood pressure check.** / Every 1 to 2 years.  Lipid and cholesterol check.** / Every 5 years beginning at age 25 years.  Lung cancer screening. / Every year if you are aged 70 80 years and have a 30-pack-year history of smoking and currently smoke or have quit within the past 15 years. Yearly screening is stopped once you have quit smoking for at least 15 years or develop a health problem that would prevent you from having lung cancer treatment.  Clinical breast exam.** / Every year after age 25 years.  BRCA-related cancer risk assessment.** / For women who have family members with a BRCA-related cancer (breast, ovarian, tubal, or peritoneal cancers).  Mammogram.** / Every year beginning at age 63 years and continuing for as long as you are in good health. Consult with your health care provider.  Pap test.** / Every 3 years starting at age 31 years through age 8  or 49 years with 3 consecutive normal Pap tests. Testing can be stopped between 65 and 70 years with 3 consecutive normal Pap tests and no abnormal Pap or HPV tests in the past 10 years.  HPV screening.** / Every 3 years from ages 53 years through ages 41 or 2 years with a history of 3 consecutive normal Pap tests. Testing can be stopped between 65 and 70 years with 3 consecutive normal Pap tests and no abnormal Pap or HPV tests in the past 10 years.  Fecal occult blood test (FOBT) of stool. / Every year beginning at age 46 years and continuing until age 63 years. You may not need to do this test if you get a colonoscopy every 10 years.  Flexible sigmoidoscopy or colonoscopy.** / Every 5 years for a flexible sigmoidoscopy or every 10 years for a colonoscopy beginning at age 57 years and continuing until age 19 years.  Hepatitis C blood test.** / For all people born from [birth year] through 1963 and any individual with known risks for hepatitis C.  Osteoporosis screening.** / A one-time screening for women ages 40 years and over and women at risk for fractures or osteoporosis.  Skin self-exam. / Monthly.  Influenza vaccine. / Every year.  Tetanus, diphtheria, and acellular pertussis (Tdap/Td) vaccine.** / 1 dose of Td every 10 years.  Varicella vaccine.** / Consult your health care provider.  Zoster vaccine.** / 1 dose for adults aged 38 years or older.  Pneumococcal 13-valent conjugate (PCV13) vaccine.** / Consult your health care provider.  Pneumococcal polysaccharide (PPSV23) vaccine.** / 1 dose for all adults aged 48 years and older.  Meningococcal vaccine.** / Consult your health care provider.  Hepatitis A vaccine.** / Consult your health care provider.  Hepatitis B vaccine.** / Consult your health care provider.  Haemophilus influenzae type b (Hib) vaccine.** / Consult your health care provider. ** Family history and personal history of risk and conditions may change your health care  provider's recommendations. Document Released: 09/03/2001 Document Revised: 04/28/2013  [facility] Patient Information 2014 [place], [place].   EXERCISE AND DIET:  We recommended that you start or continue a regular exercise program for good health. Regular exercise means any activity that  makes your heart beat faster and makes you sweat.  We recommend exercising at least 30 minutes per day at least 3 days a week, preferably 5.  We also recommend a diet low in fat and sugar / carbohydrates.  Inactivity, poor dietary choices and obesity can cause diabetes, heart attack, stroke, and kidney damage, among others.     ALCOHOL AND SMOKING:  Women should limit their alcohol intake to no more than 7 drinks/beers/glasses of wine (combined, not each!) per week. Moderation of alcohol intake to this level decreases your risk of breast cancer and liver damage.  ( And of course, no recreational drugs are part of a healthy lifestyle.)  Also, you should not be smoking at all or even being exposed to second hand smoke. Most people know smoking can cause cancer, and various heart and lung diseases, but did you know it also contributes to weakening of your bones?  Aging of your skin?  Yellowing of your teeth and nails?   CALCIUM AND VITAMIN D:  Adequate intake of calcium and Vitamin D are recommended.  The recommendations for exact amounts of these supplements seem to change often, but generally speaking 600 mg of calcium (either carbonate or citrate) and 800 units of Vitamin D per day seems prudent. Certain women may benefit from higher intake of Vitamin D.  If you are among these women, your doctor will have told you during your visit.     PAP SMEARS:  Pap smears, to check for cervical cancer or precancers,  have traditionally been done yearly, although recent scientific advances have shown that most women can have pap smears less often.  However, every woman still should have a physical exam from her gynecologist or  primary care physician every year. It will include a breast check, inspection of the vulva and vagina to check for abnormal growths or skin changes, a visual exam of the cervix, and then an exam to evaluate the size and shape of the uterus and ovaries.  And after 82 years of age, a rectal exam is indicated to check for rectal cancers. We will also provide age appropriate advice regarding health maintenance, like when you should have certain vaccines, screening for sexually transmitted diseases, bone density testing, colonoscopy, mammograms, etc.    MAMMOGRAMS:  All women over 30 years old should have a yearly mammogram. Many facilities now offer a "3D" mammogram, which may cost around $50 extra out of pocket. If possible,  we recommend you accept the option to have the 3D mammogram performed.  It both reduces the number of women who will be called back for extra views which then turn out to be normal, and it is better than the routine mammogram at detecting truly abnormal areas.     COLONOSCOPY:  Colonoscopy to screen for colon cancer is recommended for all women at age 37.  We know, you hate the idea of the prep.  We agree, BUT, having colon cancer and not knowing it is worse!!  Colon cancer so often starts as a polyp that can be seen and removed at colonscopy, which can quite literally save your life!  And if your first colonoscopy is normal and you have no family history of colon cancer, most women don't have to have it again for 10 years.  Once every ten years, you can do something that may end up saving your life, right?  We will be happy to help you get it scheduled when you are ready.  Be  sure to check your insurance coverage so you understand how much it will cost.  It may be covered as a preventative service at no cost, but you should check your particular policy.

## 2018-07-16 NOTE — Progress Notes (Signed)
Entered in error

## 2018-07-19 NOTE — Progress Notes (Deleted)
Impression and Recommendations:    1. Encounter for Medicare annual wellness exam   2. Need for shingles vaccine   3. Vision test   4. Estrogen deficiency     1) Anticipatory Guidance: Discussed importance of wearing a seatbelt while driving, not texting while driving; sunscreen when outside along with yearly skin surveillance; eating a well balanced and modest diet; physical activity at least 25 minutes per day or 150 min/ week of moderate to intense activity.  2) Immunizations / Screenings / Labs:  All immunizations and screenings that patient agrees to, are up-to-date per recommendations or will be updated today.  Patient understands the needs for q 28mo dental and yearly vision screens which pt will schedule independently. Obtain CBC, CMP, HgA1c, Lipid panel, TSH and vit D when fasting if not already done recently.   - Need for flu vaccine.  Patient declines flu vaccine today.  - 06/10/2017, colonoscopy with Dr. Havery Moros; one benign polyp was found.  No need for further colonoscopy.  - Need for repeat CT chest scan.  Last done 08/20/2017; follow up for right middle lobe nodule.  - Need for Shingles Vaccine.  Education and handouts provided to patient today.  - Per pt, has had pneumonia vaccine in the past, after age 27.  - Need for repeat DEXA.  Continue supplementation as prescribed, Calcium & Vitamin D.  See med list. - Reviewed importance of weight-bearing exercise to help prevent bone thinning.  - Continue to follow up with specialists for eye care, hearing health, and other maintenance as prescribed.  Recommended that the patient call [employer] for assistance with potential hearing aids.  - Handout provided to patient today on normal vs abnormal memory concerns.  If patient feels she would like additional screening, she knows she can ask for a referral to neurology.   3) Weight: Discussed goal of losing even 5-10% of current body weight which would improve overall  feelings of well being and improve objective health data significantly.   Improve nutrient density of diet through increasing intake of fruits and vegetables and decreasing saturated/trans fats, white flour products and refined sugar products.   4) Lifestyle & Preventative Health Maintenance: - Advised patient to continue working toward exercising to improve overall mental, physical, and emotional health.    American Heart Association guidelines for healthy diet, basically Mediterranean diet, and exercise guidelines of 30 minutes 5 days per week or more discussed in detail.  - Encouraged patient to continue vegan diet as established.  - Health counseling performed.  All questions answered.  - Reviewed the "spokes of the wheel" of mood and health management.  Stressed the importance of ongoing prudent habits, including regular exercise, appropriate sleep hygiene, healthful dietary habits, and prayer/meditation to relax.  - Encouraged patient to engage in daily physical activity, especially a formal exercise routine.  Recommended that the patient eventually strive for at least 150 minutes of moderate cardiovascular activity per week according to guidelines established by the [facility].   - Healthy dietary habits encouraged, including low-carb, and high amounts of lean protein in diet.   - Patient should also consume adequate amounts of water.    Meds ordered this encounter  Medications  . Zoster Vaccine Adjuvanted [facility]) injection    Sig: Inject 0.5 mLs into the muscle once for 1 dose.    Dispense:  0.5 mL    Refill:  0    Orders Placed This Encounter  Procedures  . DG Bone Density  .  Visual acuity screening    Gross side effects, risk and benefits, and alternatives of medications discussed with patient.  Patient is aware that all medications have potential side effects and we are unable to predict every side effect or drug-drug interaction that may occur.  Expresses verbal  understanding and consents to current therapy plan and treatment regimen.  F-up preventative CPE in 1 year. F/up sooner for chronic care management as discussed and/or prn.  Please see orders placed and AVS handed out to patient at the end of our visit for further patient instructions/ counseling done pertaining to today's office visit.   This document serves as a record of services personally performed by [name], DO. It was created on her behalf by Toni Amend, a trained medical scribe. The creation of this record is based on the scribe's personal observations and the provider's statements to them.   {Add scribe attestation statement}   Subjective:    Chief Complaint  Patient presents with  . Medicare Wellness   CC:   HPI: Cheryl Martin is a 84 y.o. female who presents to [place] at [facility] today a yearly health maintenance exam.  Health Maintenance Summary Reviewed and updated, unless pt declines services.  Colonoscopy:  06/10/2017, colonoscopy with Dr. Havery Moros; one benign polyp was found.  No further need for colonoscopy.  Patient with family history of stomach cancer and colon cancer. Tobacco History Reviewed:  Y; former smoker, quit in 1973. CT scan for screening lung CA: Last done 08/20/2017; follow up for right middle lobe nodule. Alcohol:  No concerns, no excessive use Exercise Habits:  Not meeting AHA guidelines.  Continues walking 30 minutes 3 days per week. STD concerns:  None. Drug Use:  None. Birth control method:  N/a; post-menopausal. Menses regular:  N/a; post-menopausal. Lumps or breast concerns:  No. Breast Cancer Family History:  No.  Denies family history. Bone/ DEXA scan:  Believes her last DEXA was about three years ago.  Patient states that she continues taking Vitamin D and Calcium as recommended.  She does not follow up with GI.  She had colonoscopy and upper endoscopy with Dr. Havery Moros in the past.  Patient  continues on vegan diet.  Sometimes she feel concerns about her memory, stating that every day "there's something I forget it; where did I put it, etc."

## 2018-07-21 ENCOUNTER — Ambulatory Visit
Admission: RE | Admit: 2018-07-21 | Discharge: 2018-07-21 | Disposition: A | Discharge status: Home / Self Care | Payer: Medicare Other | Source: Ambulatory Visit | Attending: Family Medicine | Admitting: Family Medicine

## 2018-07-21 DIAGNOSIS — R918 Other nonspecific abnormal finding of lung field: Secondary | ICD-10-CM

## 2018-07-21 DIAGNOSIS — R911 Solitary pulmonary nodule: Secondary | ICD-10-CM | POA: Diagnosis not present

## 2018-07-26 ENCOUNTER — Other Ambulatory Visit: Payer: Self-pay | Admitting: Nurse Practitioner

## 2018-07-29 ENCOUNTER — Telehealth: Payer: Self-pay | Admitting: Family Medicine

## 2018-07-29 NOTE — Telephone Encounter (Signed)
Patient called returning Melissa's phone call, please contact patient when available

## 2018-07-30 ENCOUNTER — Telehealth: Payer: Self-pay | Admitting: Family Medicine

## 2018-07-30 NOTE — Telephone Encounter (Signed)
Patient requesting call back for CT results, please contact patient at [phone]

## 2018-07-30 NOTE — Telephone Encounter (Signed)
Called patient and notified her of results. MPulliam, CMA/RT(R)

## 2018-08-05 DIAGNOSIS — Z809 Family history of malignant neoplasm, unspecified: Secondary | ICD-10-CM | POA: Diagnosis not present

## 2018-08-05 DIAGNOSIS — Z87891 Personal history of nicotine dependence: Secondary | ICD-10-CM | POA: Diagnosis not present

## 2018-08-05 DIAGNOSIS — R32 Unspecified urinary incontinence: Secondary | ICD-10-CM | POA: Diagnosis not present

## 2018-08-05 DIAGNOSIS — Z8249 Family history of ischemic heart disease and other diseases of the circulatory system: Secondary | ICD-10-CM | POA: Diagnosis not present

## 2018-08-05 DIAGNOSIS — Z833 Family history of diabetes mellitus: Secondary | ICD-10-CM | POA: Diagnosis not present

## 2018-08-05 DIAGNOSIS — K08409 Partial loss of teeth, unspecified cause, unspecified class: Secondary | ICD-10-CM | POA: Diagnosis not present

## 2018-08-05 DIAGNOSIS — N3281 Overactive bladder: Secondary | ICD-10-CM | POA: Diagnosis not present

## 2018-08-15 ENCOUNTER — Encounter (HOSPITAL_COMMUNITY): Payer: Self-pay | Admitting: Emergency Medicine

## 2018-08-15 ENCOUNTER — Other Ambulatory Visit: Payer: Self-pay

## 2018-08-15 ENCOUNTER — Ambulatory Visit (HOSPITAL_COMMUNITY)
Admission: EM | Admit: 2018-08-15 | Discharge: 2018-08-15 | Disposition: A | Discharge status: Home / Self Care | Payer: Medicare HMO | Attending: Emergency Medicine | Admitting: Emergency Medicine

## 2018-08-15 ENCOUNTER — Other Ambulatory Visit: Payer: Self-pay | Admitting: Family Medicine

## 2018-08-15 DIAGNOSIS — J019 Acute sinusitis, unspecified: Secondary | ICD-10-CM | POA: Diagnosis not present

## 2018-08-15 DIAGNOSIS — N3941 Urge incontinence: Secondary | ICD-10-CM

## 2018-08-15 MED ORDER — BENZONATATE 200 MG PO CAPS: 200.0000 mg | ORAL_CAPSULE | Freq: Three times a day (TID) | ORAL | 0 refills | Status: AC | PRN

## 2018-08-15 MED ORDER — AMOXICILLIN-POT CLAVULANATE 875-125 MG PO TABS: 1.0000 | ORAL_TABLET | Freq: Two times a day (BID) | ORAL | 0 refills | Status: AC

## 2018-08-15 MED ORDER — CETIRIZINE HCL 1 MG/ML PO SOLN: 5.0000 mg | Freq: Every day | ORAL | 0 refills | Status: DC
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
## 2018-08-15 NOTE — ED Triage Notes (Signed)
The patient presented to the [facility] with a complaint of a cough and sore throat x 2 days.

## 2018-08-15 NOTE — Discharge Instructions (Signed)
Begin augmentin twice daily for 10 days May use daily zyrtec or claritin 5 mL for congestion and drainage Tessalon every 8 hours for cough Tylenol, Aleeve for body aches/ sore throat Honey/lemon tea Push Fluids Rest Follow up if symptoms not resolving or worsening, changing

## 2018-08-18 NOTE — Telephone Encounter (Signed)
Patient was advised to call her insurance company to find out what is covered. MPulliam, CMA/RT(R)

## 2018-08-27 ENCOUNTER — Ambulatory Visit: Payer: Medicare HMO | Admitting: Family Medicine

## 2018-08-27 ENCOUNTER — Telehealth: Payer: Self-pay

## 2018-08-27 NOTE — Telephone Encounter (Signed)
Error

## 2018-08-28 ENCOUNTER — Ambulatory Visit: Payer: Medicare HMO | Admitting: Family Medicine

## 2018-08-31 ENCOUNTER — Ambulatory Visit: Payer: Medicare HMO

## 2018-08-31 ENCOUNTER — Ambulatory Visit (INDEPENDENT_AMBULATORY_CARE_PROVIDER_SITE_OTHER): Payer: Medicare HMO | Admitting: Family Medicine

## 2018-08-31 ENCOUNTER — Other Ambulatory Visit: Payer: Self-pay

## 2018-08-31 ENCOUNTER — Encounter: Payer: Self-pay | Admitting: Family Medicine

## 2018-08-31 VITALS — BP 128/79 | HR 75 | Temp 98.2°F | Ht 64.0 in | Wt 140.0 lb

## 2018-08-31 DIAGNOSIS — R0989 Other specified symptoms and signs involving the circulatory and respiratory systems: Secondary | ICD-10-CM

## 2018-08-31 DIAGNOSIS — J181 Lobar pneumonia, unspecified organism: Secondary | ICD-10-CM | POA: Diagnosis not present

## 2018-08-31 DIAGNOSIS — R5383 Other fatigue: Secondary | ICD-10-CM | POA: Diagnosis not present

## 2018-08-31 DIAGNOSIS — J189 Pneumonia, unspecified organism: Secondary | ICD-10-CM

## 2018-08-31 DIAGNOSIS — R5381 Other malaise: Secondary | ICD-10-CM

## 2018-08-31 DIAGNOSIS — R058 Other specified cough: Secondary | ICD-10-CM | POA: Insufficient documentation

## 2018-08-31 DIAGNOSIS — R05 Cough: Secondary | ICD-10-CM

## 2018-08-31 MED ORDER — PREDNISONE 20 MG PO TABS: ORAL_TABLET | ORAL | 0 refills | Status: DC

## 2018-08-31 MED ORDER — MOXIFLOXACIN HCL 400 MG PO TABS: 400.0000 mg | ORAL_TABLET | Freq: Every day | ORAL | 0 refills | Status: AC

## 2018-08-31 MED ORDER — HYDROCOD POLST-CPM POLST ER 10-8 MG/5ML PO SUER: 5.0000 mL | Freq: Two times a day (BID) | ORAL | 0 refills | Status: DC | PRN

## 2018-08-31 NOTE — Patient Instructions (Addendum)
Take the tussionex with precaution at night and make sure that you ambulate with your walker while on this medication.  Start using probiotics or a live culture yogurt to consume while on the antibiotics.  Advised for the patient to use tylenol or motrin PRN if fever arises. Additionally, the patient was advised to rest and avoid overly strenuous activity. Patient to consume warm liquids.     Community-Acquired Pneumonia, Adult Pneumonia is an infection of the lungs. There are different types of pneumonia. One type can develop while a person is in a hospital. A different type, called community-acquired pneumonia, develops in people who are not, or have not recently been, in the hospital or other health care facility. What are the causes?  Pneumonia may be caused by bacteria, viruses, or funguses. Community-acquired pneumonia is often caused by Streptococcus pneumonia bacteria. These bacteria are often passed from one person to another by breathing in droplets from the cough or sneeze of an infected person. What increases the risk? The condition is more likely to develop in:  People who havechronic diseases, such as chronic obstructive pulmonary disease (COPD), asthma, congestive heart failure, cystic fibrosis, diabetes, or kidney disease.  People who haveearly-stage or late-stage HIV.  People who havesickle cell disease.  People who havehad their spleen removed (splenectomy).  People who havepoor Human resources officer.  People who havemedical conditions that increase the risk of breathing in (aspirating) secretions their own mouth and nose.  People who havea weakened immune system (immunocompromised).  People who smoke.  People whotravel to areas where pneumonia-causing germs commonly exist.  People whoare around animal habitats or animals that have pneumonia-causing germs, including birds, bats, rabbits, cats, and farm animals. What are the signs or symptoms? Symptoms of this  condition include:  Adry cough.  A wet (productive) cough.  Fever.  Sweating.  Chest pain, especially when breathing deeply or coughing.  Rapid breathing or difficulty breathing.  Shortness of breath.  Shaking chills.  Fatigue.  Muscle aches. How is this diagnosed? Your health care provider will take a medical history and perform a physical exam. You may also have other tests, including:  Imaging studies of your chest, including X-rays.  Tests to check your blood oxygen level and other blood gases.  Other tests on blood, mucus (sputum), fluid around your lungs (pleural fluid), and urine. If your pneumonia is severe, other tests may be done to identify the specific cause of your illness. How is this treated? The type of treatment that you receive depends on many factors, such as the cause of your pneumonia, the medicines you take, and other medical conditions that you have. For most adults, treatment and recovery from pneumonia may occur at home. In some cases, treatment must happen in a hospital. Treatment may include:  Antibiotic medicines, if the pneumonia was caused by bacteria.  Antiviral medicines, if the pneumonia was caused by a virus.  Medicines that are given by mouth or through an IV tube.  Oxygen.  Respiratory therapy. Although rare, treating severe pneumonia may include:  Mechanical ventilation. This is done if you are not breathing well on your own and you cannot maintain a safe blood oxygen level.  Thoracentesis. This procedureremoves fluid around one lung or both lungs to help you breathe better. Follow these instructions at home:   Take over-the-counter and prescription medicines only as told by your health care provider. ? Only takecough medicine if you are losing sleep. Understand that cough medicine can prevent your bodys natural ability  to remove mucus from your lungs. ? If you were prescribed an antibiotic medicine, take it as told by your  health care provider. Do not stop taking the antibiotic even if you start to feel better.  Sleep in a semi-upright position at night. Try sleeping in a reclining chair, or place a few pillows under your head.  Do not use tobacco products, including cigarettes, chewing tobacco, and e-cigarettes. If you need help quitting, ask your health care provider.  Drink enough water to keep your urine clear or pale yellow. This will help to thin out mucus secretions in your lungs. How is this prevented? There are ways that you can decrease your risk of developing community-acquired pneumonia. Consider getting a pneumococcal vaccine if:  You are older than 83 years of age.  You are older than 83 years of age and are undergoing cancer treatment, have chronic lung disease, or have other medical conditions that affect your immune system. Ask your health care provider if this applies to you. There are different types and schedules of pneumococcal vaccines. Ask your health care provider which vaccination option is best for you. You may also prevent community-acquired pneumonia if you take these actions:  Get an influenza vaccine every year. Ask your health care provider which type of influenza vaccine is best for you.  Go to the dentist on a regular basis.  Wash your hands often. Use hand sanitizer if soap and water are not available. Contact a health care provider if:  You have a fever.  You are losing sleep because you cannot control your cough with cough medicine. Get help right away if:  You have worsening shortness of breath.  You have increased chest pain.  Your sickness becomes worse, especially if you are an older adult or have a weakened immune system.  You cough up blood. This information is not intended to replace advice given to you by your health care provider. Make sure you discuss any questions you have with your health care provider. Document Released: 07/08/2005 Document Revised:  03/27/2017 Document Reviewed: 11/02/2014 Elsevier Interactive Patient Education  2019 [employer].

## 2018-08-31 NOTE — Progress Notes (Signed)
Impression and Recommendations:    1. Chest congestion   2. Recurrent productive cough   3. Malaise and fatigue   4. Community acquired pneumonia of right lower lobe of lung ([place])       1. Community Acquired Pneumonia  -Reviewed notes and tests etc. from recent urgent care visit at [place] visualization of images done by me today, which directly influence my treatment plan, independent of radiology read.  -Patient seen at [facility] on 08/15/2018 for cough and sore throat x 3 weeks. She was diagnosed with acute sinusitis and given Augmentin BID x 10 days, Cetrizine daily, and tessalon for her cough. -Patients' symptoms improved slightly following the Augmentin.  -CXR obtained in the office today. CXR noted to have possible right lower infiltrate. Directly visualized and independently interpreted Xrays which further influenced my plan of care.  -Will give the patient a dose of prednisone taper and tussionex to start today. Advised that the patient use the tussionex at night only and if she feels dizzy, then the patient is to make sure that she uses a walker to aid with ambulation.  -Will start the patient on avelox abx x 10 days for possible pneumonia. Start to use probiotics or live culture yogurt while taking abx.  -Advised for the patient to use tylenol or motrin PRN if fever arises. Additionally, the patient was advised to rest and avoid overly strenuous activity. Patient to consume warm liquids.  -Recommended that the patient follow up if her symptoms do not improve slowly.   Follow up PRN if symptoms worsen.     Orders Placed This Encounter  Procedures  . DG Chest 2 View    Meds ordered this encounter  Medications  . predniSONE (DELTASONE) 20 MG tablet    Sig: Take 2 pills a day for 2 days, 1.5 pills a day for 2 days, 1 pill a day for 2 days then one half pill a day for 2 days then off    Dispense:  20 tablet    Refill:  0  . moxifloxacin  (AVELOX) 400 MG tablet    Sig: Take 1 tablet (400 mg total) by mouth daily for 10 days.    Dispense:  10 tablet    Refill:  0  . chlorpheniramine-HYDROcodone (TUSSIONEX) 10-8 MG/5ML SUER    Sig: Take 5 mLs by mouth every 12 (twelve) hours as needed for cough (will cause drowsiness.).    Dispense:  200 mL    Refill:  0    side effects, risk and benefits, and alternatives of medications and treatment plan in general discussed with patient.  Patient is aware that all medications have potential side effects and we are unable to predict every side effect or drug-drug interaction that may occur.   Patient will call with any questions prior to using medication if they have concerns.    Expresses verbal understanding and consents to current therapy and treatment regimen.  No barriers to understanding were identified.  Red flag symptoms and signs discussed in detail.  Patient expressed understanding regarding what to do in case of emergency\urgent symptoms  Please see AVS handed out to patient at the end of our visit for further patient instructions/ counseling done pertaining to today's office visit.   Return if symptoms worsen or fail to improve, for Please keep your regularly scheduled appointments.     Note:  This note was prepared with assistance of Dragon voice recognition software.  Occasional wrong-word or sound-a-like substitutions may have occurred due to the inherent limitations of voice recognition software.   This document serves as a record of services personally performed by [name], DO. It was created on her behalf by Steva Colder, a trained medical scribe. The creation of this record is based on the scribe's personal observations and the provider's statements to them.   I have reviewed the above medical documentation for accuracy and completeness and I concur.  [name], DO 08/31/2018 3:00  PM        --------------------------------------------------------------------------------------------------------------------------------------------------------------------------------------------------------------------------------------------    Subjective:     HPI: Cheryl Martin is a 85 y.o. female who presents to [place] at [facility] today for issues as discussed below.  She notes that she was seen at [facility] on 08/15/2018 for cough and sore throat x 3 weeks. She was diagnosed with acute sinusitis and given Augmentin BID x 10 days, Cetrizine daily, and tessalon for her cough. There wasn't a CXR obtained at the time. She notes that since her visit to the Urgent Care, she hasn't improved symptoms. She notes that her cough has improved slightly with the tessalon. Her symptoms improved with the 10 day course of Augmentin, however, she isn't "fully" over her symptoms. She tolerated Augmentin well without any abdominal issues. Pt reports associated chest tightness, left lower ribs being unconformable due to cough, productive cough x gray sputum without blood, sore throat, and decreased appetite due to symptoms. She denies fever, chills, wheezing, nausea, and any other symptoms.    Wt Readings from Last 3 Encounters:  08/31/18 140 lb (63.5 kg)  07/16/18 137 lb 9.6 oz (62.4 kg)  06/08/18 137 lb 3.2 oz (62.2 kg)   BP Readings from Last 3 Encounters:  08/31/18 128/79  08/15/18 112/61  07/16/18 128/79   Pulse Readings from Last 3 Encounters:  08/31/18 75  08/15/18 66  07/16/18 66   BMI Readings from Last 3 Encounters:  08/31/18 24.03 kg/m  07/16/18 23.62 kg/m  06/08/18 23.55 kg/m     Patient Care Team    Relationship Specialty Notifications Start End  [name], DO PCP - General Family Medicine  02/20/16   Elsie Saas, MD Consulting Physician Orthopedic Surgery  04/19/15   Foye Spurling, MD Consulting Physician Endocrinology   02/21/16   Hurman Horn, MD Consulting Physician Ophthalmology  02/21/16   Harriett Sine, MD Consulting Physician Dermatology  03/14/16   Angelia Mould, MD Consulting Physician Vascular Surgery  04/11/16   [name], MD Consulting Physician Orthopedic Surgery  52/84/13   Jodi Marble, MD Consulting Physician Otolaryngology  09/03/17   Jolene Provost, PA-C Physician Assistant Physician Assistant  09/03/17    Comment: ENT  Armbruster, Carlota Raspberry, MD Consulting Physician Gastroenterology  04/17/18      Patient Active Problem List   Diagnosis Date Noted  . Urgency incontinence 05/24/2016    Priority: High  . h/o Hyperlipidemia, mild 02/24/2016    Priority: High  . Vitamin D deficiency 02/22/2016    Priority: High  . ADD (attention deficit disorder) 02/19/2011    Priority: High  . Vegan diet 06/08/2018    Priority: Medium  . h/o low B12- on supp 02/24/2016    Priority: Medium  . thyroid disease- at time did not warrant txmnt 02/19/2011    Priority: Medium  . Chronic constipation 06/03/2017    Priority: Low  . Patient has healthcare proxy and living will 07/20/2016  Priority: Low  . OA (osteoarthritis) of knee 04/15/2016    Priority: Low  . Status post hysterectomy  02/21/2016    Priority: Low  . Chest congestion 08/31/2018  . Recurrent productive cough 08/31/2018  . Malaise and fatigue 08/31/2018  . Nail abnormalities-  longitudinal nail ridges 06/08/2018  . Family history of lung cancer- brother 09/03/2017  . Family history of stomach cancer and colon 09/03/2017  . 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 Hoarseness of voice 08/18/2017  . Blood in the stool 06/03/2017  . Incidental lung nodule-7 mm right middle lobe subpleural nodule 06/03/2017  . Urinary incontinence 05/23/2017  . Preoperative general physical examination 07/20/2016  . Chronic pain of left knee 04/11/2016  . Senile solar keratosis 03/14/2016  . Peripheral edema- bilateral lower extremities  03/14/2016  . ETD- Sx of L ear 03/14/2016  . Gen chronic Fatigue 02/24/2016  . Decreased hearing 02/19/2011    Past Medical history, Surgical history, Family history, Social history, Allergies and Medications have been entered into the medical record, reviewed and changed as needed.    Current Meds  Medication Sig  . aspirin EC 81 MG tablet Take 81 mg by mouth daily.  . Biotin 1000 MCG tablet Take 1,000 mcg daily by mouth.  . Cholecalciferol (VITAMIN D PO) Take 1 capsule daily by mouth.  . folic acid (FOLVITE) 1 MG tablet Take 1 mg daily by mouth.  Marland Kitchen GARCINIA CAMBOGIA-CHROMIUM PO Take 3 tablets by mouth 2 (two) times daily.   . Polyethylene Glycol 3350 (MIRALAX PO) Take daily by mouth.  . tolterodine (DETROL LA) 2 MG 24 hr capsule Take 1 capsule (2 mg total) by mouth daily.  . vitamin C (ASCORBIC ACID) 500 MG tablet Take 500 mg daily by mouth.    Allergies:  Allergies  Allergen Reactions  . Food     Patient is a vegetarian (does eat FISH)  . Tape Other (See Comments)    SKIN TEARS PATIENT TOLERATES PAPER TAPE SKIN TEARS PATIENT TOLERATES PAPER TAPE     Review of Systems:  A fourteen system review of systems was performed and found to be positive as per HPI.   Objective:   Blood pressure 128/79, pulse 75, temperature 98.2 F (36.8 C), height 5\' 4"  (1.626 m), weight 140 lb (63.5 kg), SpO2 98 %. Body mass index is 24.03 kg/m. General:  Well Developed, well nourished, appropriate for stated age.  Neuro:  Alert and oriented,  extra-ocular muscles intact  HEENT:  Normocephalic, atraumatic, neck supple, no carotid bruits appreciated  Skin:  no gross rash, warm, pink. Cardiac:  RRR, S1 S2 Respiratory:  ECTA B/L and A/P, Not using accessory muscles, speaking in full sentences- unlabored. Vascular:  Ext warm, no cyanosis apprec.; cap RF less 2 sec. Psych:  No HI/SI, judgement and insight good, Euthymic mood. Full Affect.

## 2018-09-01 ENCOUNTER — Telehealth: Payer: Self-pay | Admitting: Family Medicine

## 2018-09-01 DIAGNOSIS — N3941 Urge incontinence: Secondary | ICD-10-CM

## 2018-09-01 NOTE — Telephone Encounter (Signed)
Patient called states her Ins Co will not cover :  chlorpheniramine-HYDROcodone (TUSSIONEX) 10-8 MG/5ML SUER [[id number]]   Order Details  Dose: 5 mL Route: Oral Frequency: Every 12 hours PRN for cough, will cause drowsiness.  Indications of Use: Cough and Nasal Congestion  Dispense Quantity: 200 mL Refills: 0 Fills remaining: --        Sig: Take 5 mLs by mouth every 12 (twelve) hours as needed for cough (will cause drowsiness.).     &   Patient also does not want to take :   tolterodine (DETROL LA) 2 MG 24 hr capsule [[id number]]   Order Details  Dose: 2 mg Route: Oral Frequency: Daily  Dispense Quantity: 90 capsule Refills: 3 Fills remaining: --        Sig: Take 1 capsule (2 mg total) by mouth daily.     ---  Forwarding request to medical assistant to review w/ provider change in Cough medication Rx & restart patient's original Rx for :  oxybutynin (DITROPAN XL) 5 MG 24 hr tablet [[id number]] DISCONTINUED   Order Details  Dose: 5 mg Route: Oral Frequency: Daily at bedtime  Dispense Quantity: 90 tablet Refills: 0 Fills remaining: --        Sig: Take 1 tablet (5 mg total) by mouth at bedtime. Start with 5 mg daily. Can increase by 5mg  every 7 days (no sooner) until reach a max dose of 15 mg daily       Discontinue Date:  07/16/2018 11:10 Discontinue User:  Jerilee Field, [facility] Reason:  Discontinued by provider  Written Date: 06/10/18 Expiration Date: 06/10/19    Start Date: 06/10/18 End Date: 07/16/18         Ordering Provider: [name], DO DEA #:  [id number] NPI:  [id number]   Authorizing Provider: [name], DO DEA #:  [id number] NPI:  [id number]      --glh

## 2018-09-02 DIAGNOSIS — D3132 Benign neoplasm of left choroid: Secondary | ICD-10-CM | POA: Diagnosis not present

## 2018-09-02 DIAGNOSIS — Z961 Presence of intraocular lens: Secondary | ICD-10-CM | POA: Diagnosis not present

## 2018-09-02 DIAGNOSIS — H1859 Other hereditary corneal dystrophies: Secondary | ICD-10-CM | POA: Diagnosis not present

## 2018-09-02 DIAGNOSIS — H35372 Puckering of macula, left eye: Secondary | ICD-10-CM | POA: Diagnosis not present

## 2018-09-02 DIAGNOSIS — H04123 Dry eye syndrome of bilateral lacrimal glands: Secondary | ICD-10-CM | POA: Diagnosis not present

## 2018-09-02 MED ORDER — OXYBUTYNIN CHLORIDE ER 5 MG PO TB24: 5.0000 mg | ORAL_TABLET | Freq: Every day | ORAL | 0 refills | Status: DC

## 2018-09-02 NOTE — Telephone Encounter (Signed)
Sent medication into the pharmacy.  And patient notified of OTC medication for cough. MPulliam, CMA/RT(R)

## 2018-09-02 NOTE — Telephone Encounter (Signed)
I do not recommend any other prescription cough medicines as there is no generic equivalent to a 12-hour antitussive with hydrocodone that I know of.    If patient insists, you can call the pharmacy to see if they have a generic alternative for this patient that would be a 12-hour not the 4 to 6-hour one that exists.  The latter is not appropriat for someone her age.  Thus I would recommend she take over-the-counter Robitussin or Delsym as needed for cough since the Tessalon Perles did not work for her.   Please prescribe oxybutynin 5 mg extended release 1 p.o. daily, dispense 30, no refill.  Tell patient I would like her to let us know how she does on that medication and dose before I give her refills.   Thnx

## 2018-09-02 NOTE — Telephone Encounter (Signed)
Spoke to patient she states that Tussionex is not covered and is requesting a change in medication, please advise.  Patient is also requesting to try the oxybutnin instead of the detrol due to cost difference.  Please advise if this change is approve. MPulliam, CMA/RT(R)

## 2018-09-11 ENCOUNTER — Other Ambulatory Visit: Payer: Medicare Other

## 2018-09-25 ENCOUNTER — Other Ambulatory Visit: Payer: Self-pay | Admitting: Family Medicine

## 2018-09-25 DIAGNOSIS — N3941 Urge incontinence: Secondary | ICD-10-CM

## 2018-10-07 ENCOUNTER — Ambulatory Visit: Payer: Medicare Other | Admitting: Family Medicine

## 2018-10-21 ENCOUNTER — Telehealth: Payer: Self-pay | Admitting: Cardiovascular Disease

## 2018-10-21 ENCOUNTER — Ambulatory Visit (INDEPENDENT_AMBULATORY_CARE_PROVIDER_SITE_OTHER): Payer: Medicare HMO | Admitting: Family Medicine

## 2018-10-21 ENCOUNTER — Other Ambulatory Visit: Payer: Self-pay

## 2018-10-21 DIAGNOSIS — E785 Hyperlipidemia, unspecified: Secondary | ICD-10-CM

## 2018-10-21 DIAGNOSIS — R079 Chest pain, unspecified: Secondary | ICD-10-CM

## 2018-10-21 DIAGNOSIS — J3089 Other allergic rhinitis: Secondary | ICD-10-CM

## 2018-10-21 DIAGNOSIS — R0602 Shortness of breath: Secondary | ICD-10-CM | POA: Diagnosis not present

## 2018-10-21 NOTE — Telephone Encounter (Signed)
Dr. Burt Knack spoke with Dr. Hershal Coria office and agrees Cheryl Martin should be evaluated in person in the office. Scheduled Ms. Bogan tomorrow with Dr. Meda Coffee (DOD). The patient was grateful for call and agrees with treatment plan.

## 2018-10-21 NOTE — Telephone Encounter (Signed)
New Message         Primary office is calling to get a patient in with SOB ON EXERTION Dr. Raliegh Scarlet [phone] opt 2. Pls call and advise.

## 2018-10-21 NOTE — Progress Notes (Signed)
Virtual Visit via Telephone Note for [employer], D.O- Primary Care Physician at [facility]   I connected with current patient today by telephone and verified that I am speaking with the correct person using two identifiers.   Because of federal recommendations of social distancing due to the current novel COVID-19 outbreak, an audio/video telehealth visit is felt to be most appropriate for this patient at this time.  My staff members also discussed with the patient that there may be a patient charge related to this service.   The patient expressed understanding, and agreed to proceed.    History of Present Illness:   Pt called this am and spoke with [place] about her sx- was given appt with me this afternoon.    Sx started 2 d ago.     "Heaviness in chest"  Began when reached for a chart and was picking it up and pushing it to empty it at her house on Sat.     Lasted 1.5 hrs until she went into home and sat to rest.   Chest "heaviness" went away after resting 90 min.  "Whole chest felt heavy" - also during this exertion, pt felt SOB at that time as well, no N/V, no radiation of pain, had no other assoc sx.     Never experienced this before.   Has not had "chest heaviness" sx since Saturday.   However, pt also endorses she has felt a little sore "underneath her sternum" area from the incident of exertion on Saturday until Sun am when she woke up form sleep -- it was gone.      - I have known pt for some time now and she sounds a little SOB on the phone to me today and also states she has NOT had any of these sx since.     Has never had these in remote past.    Husband died of AMI - but per pt- she saw him get N/V with it and although she has been worried about that in the back of her mind, she states she didn't think so b/c she doesn't have jaw pain, or n/v.     Has had seasonal allergies in past- in addition to the above concerning sx, she does c/o typical allergy sx that she  usually gets in spring w/ pollen.   used netti pot in past but hasn't recently done anything.         Wt Readings from Last 3 Encounters:  08/31/18 140 lb (63.5 kg)  07/16/18 137 lb 9.6 oz (62.4 kg)  06/08/18 137 lb 3.2 oz (62.2 kg)    BP Readings from Last 3 Encounters:  08/31/18 128/79  08/15/18 112/61  07/16/18 128/79    Pulse Readings from Last 3 Encounters:  08/31/18 75  08/15/18 66  07/16/18 66    BMI Readings from Last 3 Encounters:  08/31/18 24.03 kg/m  07/16/18 23.62 kg/m  06/08/18 23.55 kg/m     Patient Care Team    Relationship Specialty Notifications Start End  [name], DO PCP - General Family Medicine  02/20/16   Elsie Saas, MD Consulting Physician Orthopedic Surgery  04/19/15   Foye Spurling, MD Consulting Physician Endocrinology  02/21/16   Hurman Horn, MD Consulting Physician Ophthalmology  02/21/16   Harriett Sine, MD Consulting Physician Dermatology  03/14/16   Angelia Mould, MD Consulting Physician Vascular Surgery  04/11/16   [name], MD Consulting  Physician Orthopedic Surgery  44/03/47   Jodi Marble, MD Consulting Physician Otolaryngology  09/03/17   Jolene Provost, PA-C Physician Assistant Physician Assistant  09/03/17    Comment: ENT  Armbruster, Carlota Raspberry, MD Consulting Physician Gastroenterology  04/17/18      Patient Active Problem List   Diagnosis Date Noted  . Urgency incontinence 05/24/2016    Priority: High  . Hyperlipidemia 02/24/2016    Priority: High  . Vitamin D deficiency 02/22/2016    Priority: High  . ADD (attention deficit disorder) 02/19/2011    Priority: High  . Vegan diet 06/08/2018    Priority: Medium  . h/o low B12- on supp 02/24/2016    Priority: Medium  . thyroid disease- at time did not warrant txmnt 02/19/2011    Priority: Medium  . Chronic constipation 06/03/2017    Priority: Low  . Patient has healthcare proxy and living will 07/20/2016    Priority: Low  . OA  (osteoarthritis) of knee 04/15/2016    Priority: Low  . Status post hysterectomy  02/21/2016    Priority: Low  . Environmental and seasonal allergies- aside from above sx 10/21/2018  . Exertional chest pain 10/21/2018  . SOB (shortness of breath) on exertion 10/21/2018  . Chest congestion 08/31/2018  . Recurrent productive cough 08/31/2018  . Malaise and fatigue 08/31/2018  . Nail abnormalities-  longitudinal nail ridges 06/08/2018  . Family history of lung cancer- brother 09/03/2017  . Family history of stomach cancer and colon 09/03/2017  . 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 Hoarseness of voice 08/18/2017  . Blood in the stool 06/03/2017  . Incidental lung nodule-7 mm right middle lobe subpleural nodule 06/03/2017  . Urinary incontinence 05/23/2017  . Preoperative general physical examination 07/20/2016  . Chronic pain of left knee 04/11/2016  . Senile solar keratosis 03/14/2016  . Peripheral edema- bilateral lower extremities 03/14/2016  . ETD- Sx of L ear 03/14/2016  . Gen chronic Fatigue 02/24/2016  . Decreased hearing 02/19/2011     No outpatient medications have been marked as taking for the 10/21/18 encounter (Office Visit) with [name], DO.     Allergies:  Allergies  Allergen Reactions  . Food     Patient is a vegetarian (does eat FISH)  . Tape Other (See Comments)    SKIN TEARS PATIENT TOLERATES PAPER TAPE SKIN TEARS PATIENT TOLERATES PAPER TAPE     ROS:  See above HPI for pertinent positives and negatives   Objective:   There were no vitals taken for this visit. General: sounds in no acute distress.  Skin: Pt confirms warm and dry  extremities and pink fingertips Respiratory: speaking in full sentences, no conversational dyspnea Psych: A and O *3, appears insight good, mood- full      Impression and Recommendations:     1. Exertional chest pain with assoc SOB in an otherwise very healthy, independent 85yo CF with h/o HLD- whom has refused statin  tmxnt in the past.   - URGENT Ambulatory referral to Cardiology- w/in 24 hrs please  - ->  I know pt quite well, and she doesn't complain much about anything; these are very concerning sx to me  -  pt was given strict instructions not to exert herself in any manner until seen by cards- hopefully tom. She expressed verbal understanding.  Told patient that often people do not present with all the symptoms of AMI and just because she does not have nausea and radiating chest pain does not mean this is  not ACS. Furthermore,  explained to patient our goal is to catch this early enough in the disease process before she develops all of those symptoms.   -  we will not bring pt in here to obtain EKG since this would not change mgt and only potentially expose her unnecessarily to COVID-19.   -  I explained to pt I WOULD NOT be sending her to Cards at this time unless I felt it was absolutely neccesary.     - Pt understands that without urgent further workup by specialist at this time, risk to pt's morbidity is very high with possible prolonged functional impairment and poor health outcomes.   - I also called her daughter Winfred Burn to discuss Mom's condition with her.  Left VM- no answer.     2. SOB (shortness of breath) on exertion- relieved with rest  -URGENT Ambulatory referral to Cardiology  -Dorothea Ogle spoke with on-call cardiologist, Dr. Burt Knack late in the afternoon today, who told us urgent referral appropriate.  Reassured [place] that he would speak to the necessary folks and patient would be seen by someone in the cariology office tomorrow.     3.  Seasonal and environmental allergies:  -Told patient to do Nettie pot anytime she is exposed to the outdoors and comes in.  As well preventative strategies of taking off her clothes, washing her face and hair after exposure can be very beneficial. -Explained to patient that at this time we need to focus on her other symptoms and we can address this  further at a later time.   I told her I do not want her going outside and walking around or doing any work anyhow at this time.   I discussed the assessment and treatment plan with the patient. The patient was provided an opportunity to ask questions and all were answered. The patient agreed with the plan and demonstrated an understanding of the instructions.   The patient was advised to call back or seek an in-person evaluation if the symptoms worsen or if the condition fails to improve as anticipated.   Orders Placed This Encounter  Procedures  . Ambulatory referral to Cardiology    Gross side effects, risk and benefits, and alternatives of medications and treatment plan in general discussed with patient.  Patient is aware that all medications have potential side effects and we are unable to predict every side effect or drug-drug interaction that may occur.   Patient was strongly encouraged to call with any questions or concerns they may have concerns.    Expresses verbal understanding and consents to current therapy and treatment regimen.  No barriers to understanding were identified.  Red flag symptoms and signs discussed in detail.  Patient expressed understanding regarding what to do in case of emergency\urgent symptoms  I provided 20+ minutes of non-face-to-face time during this encounter.     [name], DO

## 2018-10-22 ENCOUNTER — Telehealth: Payer: Self-pay

## 2018-10-22 ENCOUNTER — Ambulatory Visit: Payer: Medicare HMO | Admitting: Cardiology

## 2018-10-22 ENCOUNTER — Encounter: Payer: Self-pay | Admitting: Cardiology

## 2018-10-22 ENCOUNTER — Encounter: Payer: Self-pay | Admitting: *Deleted

## 2018-10-22 VITALS — BP 110/62 | HR 62 | Ht 64.0 in | Wt 140.0 lb

## 2018-10-22 DIAGNOSIS — R072 Precordial pain: Secondary | ICD-10-CM | POA: Diagnosis not present

## 2018-10-22 DIAGNOSIS — E782 Mixed hyperlipidemia: Secondary | ICD-10-CM | POA: Diagnosis not present

## 2018-10-22 DIAGNOSIS — I2510 Atherosclerotic heart disease of native coronary artery without angina pectoris: Secondary | ICD-10-CM

## 2018-10-22 DIAGNOSIS — I2584 Coronary atherosclerosis due to calcified coronary lesion: Secondary | ICD-10-CM

## 2018-10-22 DIAGNOSIS — R0789 Other chest pain: Secondary | ICD-10-CM | POA: Diagnosis not present

## 2018-10-22 MED ORDER — ROSUVASTATIN CALCIUM 10 MG PO TABS: 10.0000 mg | ORAL_TABLET | Freq: Every day | ORAL | 3 refills | Status: DC

## 2018-10-22 MED ORDER — NITROGLYCERIN 0.4 MG SL SUBL: 0.4000 mg | SUBLINGUAL_TABLET | SUBLINGUAL | 2 refills | Status: AC | PRN

## 2018-10-22 NOTE — Telephone Encounter (Signed)
Patient called requesting a visit due to chest pain since Saturday after lifting while doing yard work.  Patient states that the pain has improved - just soreness currently.  Patient states that she is having some SOB and a slight cough.  Patient states that she also is having sinus congestion and post nasal drainage.  Denies fever. Spoke with Dr. Raliegh Scarlet on rather the patient needed to go to urgent care or if a tele-visit could be done for the patient.  Advised that patient could have tele-visit and appointment time was set up with the patient this afternoon (10/21/2018) with Dr. Raliegh Scarlet. MPulliam, CMA/RT(R)

## 2018-10-22 NOTE — Patient Instructions (Signed)
Medication Instructions:   START TAKING ROSUVASTATIN 10 MG BY MOUTH DAILY AT BEDTIME  DR NELSON HAS PRESCRIBED FOR YOU TO USE NITROGLYCERIN 0.4 MG SUBLINGUAL (UNDER THE TONGUE) ONLY AS NEEDED FOR CHEST PAIN-- PLACE 1 TABLET UNDER THE TONGUE AND LET DISSOLVE EVERY 5 MINUTES TAKING NO MORE THAN 3 TABLETS.  IF YOU ARE HAVING TO USE 2 TABLETS WITH NO CHEST PAIN RELIEF THEN PLEASE CALL 911 OR REPORT TO THE CLOSEST ER.   If you need a refill on your cardiac medications before your next appointment, please call your pharmacy.     Lab work:  IN 4 MONTHS--SAME DAY AS YOUR 4 MONTH FOLLOW-UP APPOINTMENT WITH DR NELSON--TO CHECK A CMET AND LIPIDS--PLEASE COME FASTING TO THIS LAB APPOINTMENT  If you have labs (blood work) drawn today and your tests are completely normal, you will receive your results only by: Marland Kitchen MyChart Message (if you have MyChart) OR . A paper copy in the mail If you have any lab test that is abnormal or we need to change your treatment, we will call you to review the results.    Testing/Procedures:  Your physician has requested that you have a lexiscan myoview. For further information please visit [email or web address]. Please follow instruction sheet, as given.  PLEASE DO ON D-SPECT PER DR NELSON--PLEASE SCHEDULE THIS TEST FOR TOMORROW OR EARLY NEXT WEEK PER DR NELSON     Follow-Up:  4 MONTHS WITH DR NELSON--PLEASE HAVE YOUR LABS DONE SAME DAY PER DR Meda Coffee
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
## 2018-10-23 ENCOUNTER — Telehealth: Payer: Self-pay | Admitting: Cardiology

## 2018-10-23 ENCOUNTER — Ambulatory Visit (HOSPITAL_COMMUNITY): Payer: Medicare HMO | Attending: Cardiology

## 2018-10-23 ENCOUNTER — Other Ambulatory Visit: Payer: Self-pay

## 2018-10-23 DIAGNOSIS — I2584 Coronary atherosclerosis due to calcified coronary lesion: Secondary | ICD-10-CM | POA: Diagnosis not present

## 2018-10-23 DIAGNOSIS — R072 Precordial pain: Secondary | ICD-10-CM | POA: Diagnosis not present

## 2018-10-23 DIAGNOSIS — I2510 Atherosclerotic heart disease of native coronary artery without angina pectoris: Secondary | ICD-10-CM | POA: Insufficient documentation

## 2018-10-23 DIAGNOSIS — E782 Mixed hyperlipidemia: Secondary | ICD-10-CM | POA: Insufficient documentation

## 2018-10-23 DIAGNOSIS — R0789 Other chest pain: Secondary | ICD-10-CM | POA: Diagnosis not present

## 2018-10-23 MED ORDER — TECHNETIUM TC 99M TETROFOSMIN IV KIT
10.3000 | PACK | Freq: Once | INTRAVENOUS | Status: AC | PRN
  Administered 2018-10-23: 10.3 via INTRAVENOUS
  Filled 2018-10-23: qty 11

## 2018-10-23 MED ORDER — TECHNETIUM TC 99M TETROFOSMIN IV KIT
31.7000 | PACK | Freq: Once | INTRAVENOUS | Status: AC | PRN
  Administered 2018-10-23: 31.7 via INTRAVENOUS
  Filled 2018-10-23: qty 32

## 2018-10-23 MED ORDER — REGADENOSON 0.4 MG/5ML IV SOLN
0.4000 mg | Freq: Once | INTRAVENOUS | Status: AC
  Administered 2018-10-23: 0.4 mg via INTRAVENOUS

## 2018-10-23 NOTE — Telephone Encounter (Signed)
New Message   Pts daughter is calling because she dropped her mom off this morning for her stress test and her mother is suppose to call her when she is finished to let her know so she can pick her up  Pts daughter would like someone to call her back

## 2018-10-23 NOTE — Telephone Encounter (Signed)
This issue was addressed.  The pts daughter spoke with her and will be picking her up now downstairs in the lobby. Daughter gracious for all the assistance provided.

## 2018-10-26 LAB — MYOCARDIAL PERFUSION IMAGING
LV dias vol: 55 mL (ref 46–106)
LV sys vol: 20 mL
Peak HR: 93 {beats}/min
Rest HR: 52 {beats}/min
SDS: 2
SRS: 1
SSS: 3
TID: 1.07

## 2018-11-02 ENCOUNTER — Ambulatory Visit: Payer: Medicare Other | Admitting: Family Medicine

## 2018-11-05 ENCOUNTER — Telehealth: Payer: Self-pay | Admitting: Family Medicine

## 2018-11-05 ENCOUNTER — Other Ambulatory Visit: Payer: Self-pay

## 2018-11-05 DIAGNOSIS — N3941 Urge incontinence: Secondary | ICD-10-CM

## 2018-11-05 MED ORDER — OXYBUTYNIN CHLORIDE ER 5 MG PO TB24: ORAL_TABLET | ORAL | 0 refills | Status: DC

## 2018-11-05 NOTE — Telephone Encounter (Signed)
Patient request refill on :   oxybutynin (DITROPAN-XL) 5 MG 24 hr tablet [[id number]]   Order Details  Dose, Route, Frequency: As Directed   Dispense Quantity: 30 tablet Refills: 0 Fills remaining: --        Sig: TAKE 1 TABLET BY MOUTH EVERYDAY AT BEDTIME     ---Forwarding request to medical assistant that if Approved send refill order to :  Preferred Pharmacies      [id number] [address], [place]. [phone] (Phone) [phone] (Fax)   --Dion Body

## 2018-11-05 NOTE — Telephone Encounter (Signed)
Refill sent in for the patient. MPulliam, CMA/RT(R)

## 2018-11-09 ENCOUNTER — Other Ambulatory Visit: Payer: Self-pay

## 2018-11-09 ENCOUNTER — Encounter: Payer: Self-pay | Admitting: Family Medicine

## 2018-11-09 ENCOUNTER — Ambulatory Visit (INDEPENDENT_AMBULATORY_CARE_PROVIDER_SITE_OTHER): Payer: Medicare HMO | Admitting: Family Medicine

## 2018-11-09 VITALS — BP 136/70 | HR 60 | Temp 97.5°F | Ht 64.0 in | Wt 138.2 lb

## 2018-11-09 DIAGNOSIS — M545 Low back pain, unspecified: Secondary | ICD-10-CM

## 2018-11-09 DIAGNOSIS — M62838 Other muscle spasm: Secondary | ICD-10-CM

## 2018-11-09 MED ORDER — CYCLOBENZAPRINE HCL 5 MG PO TABS: 5.0000 mg | ORAL_TABLET | Freq: Three times a day (TID) | ORAL | 0 refills | Status: DC | PRN

## 2018-11-09 NOTE — Patient Instructions (Signed)
Muscle Cramps and Spasms Muscle cramps and spasms occur when a muscle or muscles tighten and you have no control over this tightening (involuntary muscle contraction). They are a common problem and can develop in any muscle. The most common place is in the calf muscles of the leg. Muscle cramps and muscle spasms are both involuntary muscle contractions, but there are some differences between the two:  Muscle cramps are painful. They come and go and may last for a few seconds or up to 15 minutes. Muscle cramps are often more forceful and last longer than muscle spasms.  Muscle spasms may or may not be painful. They may also last just a few seconds or much longer. Certain medical conditions, such as diabetes or Parkinson's disease, can make it more likely to develop cramps or spasms. However, cramps or spasms are usually not caused by a serious underlying problem. Common causes include:  Doing more physical work or exercise than your body is ready for (overexertion).  Overuse from repeating certain movements too many times.  Remaining in a certain position for a long period of time.  Improper preparation, form, or technique while playing a sport or doing an activity.  Dehydration.  Injury.  Side effects of some medicines.  Abnormally low levels of the salts and minerals in your blood (electrolytes), especially potassium and calcium. This could happen if you are taking water pills (diuretics) or if you are pregnant. In many cases, the cause of muscle cramps or spasms is not known. Follow these instructions at home: Managing pain and stiffness      Try massaging, stretching, and relaxing the affected muscle. Do this for several minutes at a time.  If directed, apply heat to tight or tense muscles as often as told by your health care provider. Use the heat source that your health care provider recommends, such as a moist heat pack or a heating pad. ? Place a towel between your skin  and the heat source. ? Leave the heat on for 20-30 minutes. ? Remove the heat if your skin turns bright red. This is especially important if you are unable to feel pain, heat, or cold. You may have a greater risk of getting burned.  If directed, put ice on the affected area. This may help if you are sore or have pain after a cramp or spasm. ? Put ice in a plastic bag. ? Place a towel between your skin and the bag. ? Leavethe ice on for 20 minutes, 2-3 times a day.  Try taking hot showers or baths to help relax tight muscles. Eating and drinking  Drink enough fluid to keep your urine pale yellow. Staying well hydrated may help prevent cramps or spasms.  Eat a healthy diet that includes plenty of nutrients to help your muscles function. A healthy diet includes fruits and vegetables, lean protein, whole grains, and low-fat or nonfat dairy products. General instructions  If you are having frequent cramps, avoid intense exercise for several days.  Take over-the-counter and prescription medicines only as told by your health care provider.  Pay attention to any changes in your symptoms.  Keep all follow-up visits as told by your health care provider. This is important. Contact a health care provider if:  Your cramps or spasms get more severe or happen more often.  Your cramps or spasms do not improve over time. Summary  Muscle cramps and spasms occur when a muscle or muscles tighten and you have no control over  this tightening (involuntary muscle contraction).  The most common place for cramps or spasms to occur is in the calf muscles of the leg.  Massaging, stretching, and relaxing the affected muscle may relieve the cramp or spasm.  Drink enough fluid to keep your urine pale yellow. Staying well hydrated may help prevent cramps or spasms. This information is not intended to replace advice given to you by your health care provider. Make sure you discuss any questions you have with  your health care provider. Document Released: 12/28/2001 Document Revised: 12/01/2017 Document Reviewed: 12/01/2017 Elsevier Interactive Patient Education  2019 [employer]    Acute Back Pain, Adult Acute back pain is sudden and usually short-lived. It is often caused by an injury to the muscles and tissues in the back. The injury may result from:  A muscle or ligament getting overstretched or torn (strained). Ligaments are tissues that connect bones to each other. Lifting something improperly can cause a back strain.  Wear and tear (degeneration) of the spinal disks. Spinal disks are circular tissue that provides cushioning between the bones of the spine (vertebrae).  Twisting motions, such as while playing sports or doing yard work.  A hit to the back.  Arthritis. You may have a physical exam, lab tests, and imaging tests to find the cause of your pain. Acute back pain usually goes away with rest and home care. Follow these instructions at home: Managing pain, stiffness, and swelling  Take over-the-counter and prescription medicines only as told by your health care provider.  Your health care provider may recommend applying ice during the first 24-48 hours after your pain starts. To do this: ? Put ice in a plastic bag. ? Place a towel between your skin and the bag. ? Leave the ice on for 20 minutes, 2-3 times a day.  If directed, apply heat to the affected area as often as told by your health care provider. Use the heat source that your health care provider recommends, such as a moist heat pack or a heating pad. ? Place a towel between your skin and the heat source. ? Leave the heat on for 20-30 minutes. ? Remove the heat if your skin turns bright red. This is especially important if you are unable to feel pain, heat, or cold. You have a greater risk of getting burned. Activity   Do not stay in bed. Staying in bed for more than 1-2 days can delay your recovery.  Sit up and  stand up straight. Avoid leaning forward when you sit, or hunching over when you stand. ? If you work at a desk, sit close to it so you do not need to lean over. Keep your chin tucked in. Keep your neck drawn back, and keep your elbows bent at a right angle. Your arms should look like the letter "L." ? Sit high and close to the steering wheel when you drive. Add lower back (lumbar) support to your car seat, if needed.  Take short walks on even surfaces as soon as you are able. Try to increase the length of time you walk each day.  Do not sit, drive, or stand in one place for more than 30 minutes at a time. Sitting or standing for long periods of time can put stress on your back.  Do not drive or use heavy machinery while taking prescription pain medicine.  Use proper lifting techniques. When you bend and lift, use positions that put less stress on your back: ?  Bend your knees. ? Keep the load close to your body. ? Avoid twisting.  Exercise regularly as told by your health care provider. Exercising helps your back heal faster and helps prevent back injuries by keeping muscles strong and flexible.  Work with a physical therapist to make a safe exercise program, as recommended by your health care provider. Do any exercises as told by your physical therapist. Lifestyle  Maintain a healthy weight. Extra weight puts stress on your back and makes it difficult to have good posture.  Avoid activities or situations that make you feel anxious or stressed. Stress and anxiety increase muscle tension and can make back pain worse. Learn ways to manage anxiety and stress, such as through exercise. General instructions  Sleep on a firm mattress in a comfortable position. Try lying on your side with your knees slightly bent. If you lie on your back, put a pillow under your knees.  Follow your treatment plan as told by your health care provider. This may include: ? Cognitive or behavioral therapy. ?  Acupuncture or massage therapy. ? Meditation or yoga. Contact a health care provider if:  You have pain that is not relieved with rest or medicine.  You have increasing pain going down into your legs or buttocks.  Your pain does not improve after 2 weeks.  You have pain at night.  You lose weight without trying.  You have a fever or chills. Get help right away if:  You develop new bowel or bladder control problems.  You have unusual weakness or numbness in your arms or legs.  You develop nausea or vomiting.  You develop abdominal pain.  You feel faint. Summary  Acute back pain is sudden and usually short-lived.  Use proper lifting techniques. When you bend and lift, use positions that put less stress on your back.  Take over-the-counter and prescription medicines and apply heat or ice as directed by your health care provider. This information is not intended to replace advice given to you by your health care provider. Make sure you discuss any questions you have with your health care provider. Document Released: 07/08/2005 Document Revised: 02/12/2018 Document Reviewed: 02/19/2017 Elsevier Interactive Patient Education  2019 [employer].
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
## 2018-12-02 ENCOUNTER — Other Ambulatory Visit: Payer: Self-pay | Admitting: Family Medicine

## 2018-12-02 DIAGNOSIS — N3941 Urge incontinence: Secondary | ICD-10-CM

## 2018-12-21 DIAGNOSIS — I639 Cerebral infarction, unspecified: Secondary | ICD-10-CM

## 2018-12-21 HISTORY — DX: Cerebral infarction, unspecified: I63.9

## 2018-12-24 ENCOUNTER — Emergency Department (HOSPITAL_COMMUNITY): Payer: Medicare HMO

## 2018-12-24 ENCOUNTER — Other Ambulatory Visit: Payer: Self-pay

## 2018-12-24 ENCOUNTER — Encounter (HOSPITAL_COMMUNITY): Payer: Self-pay | Admitting: *Deleted

## 2018-12-24 ENCOUNTER — Emergency Department (HOSPITAL_COMMUNITY)
Admission: EM | Admit: 2018-12-24 | Discharge: 2018-12-25 | Disposition: A | Discharge status: Home / Self Care | Payer: Medicare HMO | Attending: Emergency Medicine | Admitting: Emergency Medicine

## 2018-12-24 DIAGNOSIS — Z7982 Long term (current) use of aspirin: Secondary | ICD-10-CM | POA: Diagnosis not present

## 2018-12-24 DIAGNOSIS — F909 Attention-deficit hyperactivity disorder, unspecified type: Secondary | ICD-10-CM | POA: Diagnosis not present

## 2018-12-24 DIAGNOSIS — R4182 Altered mental status, unspecified: Secondary | ICD-10-CM | POA: Diagnosis not present

## 2018-12-24 DIAGNOSIS — I10 Essential (primary) hypertension: Secondary | ICD-10-CM | POA: Diagnosis not present

## 2018-12-24 DIAGNOSIS — R479 Unspecified speech disturbances: Secondary | ICD-10-CM | POA: Diagnosis not present

## 2018-12-24 DIAGNOSIS — R29818 Other symptoms and signs involving the nervous system: Secondary | ICD-10-CM | POA: Diagnosis not present

## 2018-12-24 DIAGNOSIS — Z79899 Other long term (current) drug therapy: Secondary | ICD-10-CM | POA: Diagnosis not present

## 2018-12-24 DIAGNOSIS — R410 Disorientation, unspecified: Secondary | ICD-10-CM | POA: Diagnosis not present

## 2018-12-24 DIAGNOSIS — R69 Illness, unspecified: Secondary | ICD-10-CM | POA: Diagnosis not present

## 2018-12-24 DIAGNOSIS — R51 Headache: Secondary | ICD-10-CM | POA: Diagnosis not present

## 2018-12-24 DIAGNOSIS — R4781 Slurred speech: Secondary | ICD-10-CM | POA: Diagnosis not present

## 2018-12-24 DIAGNOSIS — R531 Weakness: Secondary | ICD-10-CM

## 2018-12-24 DIAGNOSIS — R918 Other nonspecific abnormal finding of lung field: Secondary | ICD-10-CM | POA: Diagnosis not present

## 2018-12-24 DIAGNOSIS — R0902 Hypoxemia: Secondary | ICD-10-CM | POA: Diagnosis not present

## 2018-12-24 LAB — COMPREHENSIVE METABOLIC PANEL
ALT: 15 U/L (ref 0–44)
AST: 20 U/L (ref 15–41)
Albumin: 3.9 g/dL (ref 3.5–5.0)
Alkaline Phosphatase: 66 U/L (ref 38–126)
Anion gap: 8 (ref 5–15)
BUN: 18 mg/dL (ref 8–23)
CO2: 22 mmol/L (ref 22–32)
Calcium: 9.5 mg/dL (ref 8.9–10.3)
Chloride: 106 mmol/L (ref 98–111)
Creatinine, Ser: 0.7 mg/dL (ref 0.44–1.00)
GFR calc Af Amer: >60 mL/min (ref >60)
GFR calc non Af Amer: >60 mL/min (ref >60)
Glucose, Bld: 98 mg/dL (ref 70–99)
Potassium: 3.8 mmol/L (ref 3.5–5.1)
Sodium: 136 mmol/L (ref 135–145)
Total Bilirubin: 0.5 mg/dL (ref 0.3–1.2)
Total Protein: 6 g/dL — ABNORMAL LOW (ref 6.5–8.1)

## 2018-12-24 LAB — DIFFERENTIAL
Abs Immature Granulocytes: 0.01 10*3/uL (ref 0.00–0.07)
Basophils Absolute: 0 10*3/uL (ref 0.0–0.1)
Basophils Relative: 1 %
Eosinophils Absolute: 0.1 10*3/uL (ref 0.0–0.5)
Eosinophils Relative: 2 %
Immature Granulocytes: 0 %
Lymphocytes Relative: 31 %
Lymphs Abs: 1.6 10*3/uL (ref 0.7–4.0)
Monocytes Absolute: 0.6 10*3/uL (ref 0.1–1.0)
Monocytes Relative: 11 %
Neutro Abs: 3 10*3/uL (ref 1.7–7.7)
Neutrophils Relative %: 55 %

## 2018-12-24 LAB — I-STAT CHEM 8, ED
BUN: 20 mg/dL (ref 8–23)
Calcium, Ion: 1.19 mmol/L (ref 1.15–1.40)
Chloride: 104 mmol/L (ref 98–111)
Creatinine, Ser: 0.6 mg/dL (ref 0.44–1.00)
Glucose, Bld: 94 mg/dL (ref 70–99)
HCT: 36 % (ref 36.0–46.0)
Hemoglobin: 12.2 g/dL (ref 12.0–15.0)
Potassium: 3.9 mmol/L (ref 3.5–5.1)
Sodium: 136 mmol/L (ref 135–145)
TCO2: 23 mmol/L (ref 22–32)

## 2018-12-24 LAB — CBC
HCT: 37 % (ref 36.0–46.0)
Hemoglobin: 12.5 g/dL (ref 12.0–15.0)
MCH: 30.3 pg (ref 26.0–34.0)
MCHC: 33.8 g/dL (ref 30.0–36.0)
MCV: 89.8 fL (ref 80.0–100.0)
Platelets: 178 10*3/uL (ref 150–400)
RBC: 4.12 MIL/uL (ref 3.87–5.11)
RDW: 12.6 % (ref 11.5–15.5)
WBC: 5.4 10*3/uL (ref 4.0–10.5)
nRBC: 0 % (ref 0.0–0.2)

## 2018-12-24 LAB — CBG MONITORING, ED: Glucose-Capillary: 90 mg/dL (ref 70–99)

## 2018-12-24 LAB — PROTIME-INR
INR: 1 (ref 0.8–1.2)
Prothrombin Time: 13.3 seconds (ref 11.4–15.2)

## 2018-12-24 LAB — APTT: aPTT: 27 seconds (ref 24–36)

## 2018-12-24 LAB — ETHANOL: Alcohol, Ethyl (B): <10 mg/dL (ref <10)

## 2018-12-24 NOTE — ED Triage Notes (Signed)
Pt from home: per daughter, pt was talking to her daughter at 71 when pt began having expressive aphasia last 30-45 mins per EMS. Pt has been c/o headache since 7pm that radiates from the back to the left frontal. EMS reported lsided facial droop, daughter reported that to be at baseline. Hx of high cholesterol

## 2018-12-24 NOTE — ED Notes (Signed)
Patient transported to CT 

## 2018-12-24 NOTE — ED Notes (Signed)
DaughterArbie Cookey, [phone] for updates

## 2018-12-24 NOTE — ED Provider Notes (Signed)
F/u on MRI If negative she can be discharged    Ripley Fraise, MD 12/24/18 2309
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
## 2018-12-25 MED ORDER — ACETAMINOPHEN 325 MG PO TABS
650.0000 mg | ORAL_TABLET | Freq: Once | ORAL | Status: AC
  Administered 2018-12-25: 01:00:00 650 mg via ORAL
  Filled 2018-12-25: qty 2

## 2018-12-25 NOTE — ED Provider Notes (Signed)
MRI negative Pt feels well, no focal weakness She has mild HA Will d/c home D/w daughter Arbie Cookey via phone    Ripley Fraise, MD 12/25/18 [phone]

## 2018-12-25 NOTE — Discharge Instructions (Addendum)

## 2018-12-31 ENCOUNTER — Encounter: Payer: Self-pay | Admitting: Family Medicine

## 2018-12-31 ENCOUNTER — Other Ambulatory Visit: Payer: Self-pay

## 2018-12-31 ENCOUNTER — Ambulatory Visit (INDEPENDENT_AMBULATORY_CARE_PROVIDER_SITE_OTHER): Payer: Medicare HMO | Admitting: Family Medicine

## 2018-12-31 VITALS — BP 117/65 | HR 75 | Temp 98.3°F | Ht 64.0 in | Wt 135.3 lb

## 2018-12-31 DIAGNOSIS — G459 Transient cerebral ischemic attack, unspecified: Secondary | ICD-10-CM | POA: Diagnosis not present

## 2018-12-31 DIAGNOSIS — E785 Hyperlipidemia, unspecified: Secondary | ICD-10-CM | POA: Diagnosis not present

## 2018-12-31 NOTE — Progress Notes (Signed)
This was a hospital follow-up   Impression and Recommendations:    1. TIA (transient ischemic attack)   2. Hyperlipidemia, unspecified hyperlipidemia type    -Regarding pt's recent hospitalization and/or ED visit: reviewed in great detail recent hospitalization notes, clinical lab tests, tests in the radiology section of C[facility], tests in the medicine testing of [facility], and obtained history from family member. Complexity: Moderate to complex   TIA (transient ischemic attack)  -Explained to patient this was likely a transient ischemic attack and I explained what this meant/and pathophysiology to patient and daughter. -Importance of good control of blood pressure, hyperlipidemia reviewed with patient and daughter.  Continue her vegetarian diet and keep weight well controlled as she is currently.  Encouraged exercise of walking 1 hour a day  -Patient has a cardiologist-Dr. Meda Coffee and patient has a follow-up in the near future but it is not until August.  -Explained to patient and daughter that patient should have carotid ultrasound, echocardiogram of her heart and possibly other tests as deemed necessary by her cardiologist in the near future.  To better keep things streamlined, patient and daughter prefer for her cardiologist to order these and for them to be done right in their office.  -told family I would go ahead and send Dr. Meda Coffee a note on patient regarding this new cerebrovascular incident and if she would like me to go ahead and order these tests or they would like to bring her in sooner to do so.  Start: aspirin EC 81 MG tablet,    Hyperlipidemia, unspecified hyperlipidemia type  - Continue Crestor per Cards- started by Dr Meda Coffee.   -Reminded patient that she needs to follow-up with cardiology on a yearly basis or sooner per their recommendations- she will in Aug '20 -Also needs to have fasting blood work to check cholesterol etc- she is due for full set 06/11/2019  -  also,  needs Medicare wellness on a yearly basis- come near future and we can get bldwrk   Meds ordered this encounter  Medications  . aspirin EC 81 MG tablet    Sig: Take 1 tablet (81 mg total) by mouth daily.    Dispense:  90 tablet    Refill:  3   Gross side effects, risk and benefits, and alternatives of medications and treatment plan in general discussed with patient and her daughter Arbie Cookey.  Patient is aware that all medications have potential side effects and we are unable to predict every side effect or drug-drug interaction that may occur.   Patient will call with any questions prior to using medication ( ASA)  if they have concerns.    Expresses verbal understanding and consents to current therapy and treatment regimen.  No barriers to understanding were identified.  Red flag symptoms and signs discussed in detail.  Patient expressed understanding regarding what to do in case of emergency\urgent symptoms  Please see AVS handed out to patient at the end of our visit for further patient instructions/ counseling done pertaining to today's office visit.   Return for 2) 4-5 months sooner if issues.-Patient will need full set fasting blood work then     Note:  This note was prepared with assistance of Systems analyst. Occasional wrong-word or sound-a-like substitutions may have occurred due to the inherent limitations of voice recognition software.  [name], DO 01/02/2019 8:41 PM    --------------------------------------------------------------------------------------------------------------------------------------------------------------------------------------------------------------------------------------------    Subjective:     HPI: MIKAH ROTTINGHAUS is  a 85 y.o. female who presents to [place] [facility] today for issues as discussed below.  Patient presents to the clinic today with her daughter Arbie Cookey for follow-up of an ER visit.   She was seen in the ED on 12/24/2018 for probable cerebral vascular incident.     Daughter Arbie Cookey who is with the patient today states she was a middle of discussing some things with her mom- said patient- on the telephone when Arbie Cookey noticed that some of her words were being garbled and she could not articulate well.  This occurred for at least 30 minutes or so.  Arbie Cookey then called EMS to the house and she was evaluated and brought to the emergency room for some facial droop but no speech impairment.  Of note patient states she did have a headache earlier in the day upon ER arrival she was asymptomatic.  NIH score of 0.   CT scan, chest x-ray, MRI of the brain and labs were all obtained. -She was within the window for TPA but symptoms had completely resolved by the time she got there.  Neurology was consulted and felt that she needed an MRI and if negative can be discharged.   She was told to follow-up with neurology as an outpatient.  She has not done so yet.  Today patient presents and feels completely "normal ".  She is in her regular state of health.  She has no complaints of weakness, dizziness, headache, visual changes, or any focal neurological deficits.   Per Arbie Cookey her daughter in the room with her today also states that mom has been within normal limits and she is noticed no deficits or abnormalities.  Patient lives with her daughter Prescott Gum is still working.  -She tells me today she does not know exactly what happened to her in the OR and it was never explained to her.  Also, she was not told to take any aspirin and no further treatment was rendered.  She is wondering what she needs to do now.    Recent Results (from the past 2160 hour(s))  Myocardial Perfusion Imaging     Status: None   Collection Time: 10/23/18 10:36 AM  Result Value Ref Range   Rest HR 52 bpm   Rest BP 128/60 mmHg   Peak HR 93 bpm   Peak BP 124/58 mmHg   SSS 3    SRS 1    SDS 2    TID 1.07    LV sys vol 20 mL    LV dias vol 55 46 - 106 mL  CBG monitoring, ED     Status: None   Collection Time: 12/24/18  9:29 PM  Result Value Ref Range   Glucose-Capillary 90 70 - 99 mg/dL  Ethanol     Status: None   Collection Time: 12/24/18  9:37 PM  Result Value Ref Range   Alcohol, Ethyl (B) <10 <10 mg/dL    Comment: (NOTE) Lowest detectable limit for serum alcohol is 10 mg/dL. For medical purposes only. Performed at [facility], [place] [address] [place], [place] [address]   Protime-INR     Status: None   Collection Time: 12/24/18  9:37 PM  Result Value Ref Range   Prothrombin Time 13.3 11.4 - 15.2 seconds   INR 1.0 0.8 - 1.2    Comment: (NOTE) INR goal varies based on device and disease states. Performed at [facility], [place] [address] [place], [place]  [address]   APTT     Status: None   Collection Time: 12/24/18  9:37 PM  Result Value Ref Range   aPTT 27 24 - 36 seconds    Comment: Performed at [facility] [address] [place], [place] [address]  CBC     Status: None   Collection Time: 12/24/18  9:37 PM  Result Value Ref Range   WBC 5.4 4.0 - 10.5 K/uL   RBC 4.12 3.87 - 5.11 MIL/uL   Hemoglobin 12.5 12.0 - 15.0 g/dL   HCT 37.0 36.0 - 46.0 %   MCV 89.8 80.0 - 100.0 fL   MCH 30.3 26.0 - 34.0 pg   MCHC 33.8 30.0 - 36.0 g/dL   RDW 12.6 11.5 - 15.5 %   Platelets 178 150 - 400 K/uL   nRBC 0.0 0.0 - 0.2 %    Comment: Performed at [facility], [place] [address], [place], [place] [address]  Differential     Status: None   Collection Time: 12/24/18  9:37 PM  Result Value Ref Range   Neutrophils Relative % 55 %   Neutro Abs 3.0 1.7 - 7.7 K/uL   Lymphocytes Relative 31 %   Lymphs Abs 1.6 0.7 - 4.0 K/uL   Monocytes Relative 11 %   Monocytes Absolute 0.6 0.1 - 1.0 K/uL   Eosinophils Relative 2 %   Eosinophils Absolute 0.1 0.0 - 0.5 K/uL   Basophils Relative 1 %   Basophils Absolute 0.0 0.0 - 0.1 K/uL   Immature Granulocytes 0 %   Abs Immature Granulocytes 0.01 0.00  - 0.07 K/uL    Comment: Performed at [facility], [place] [address], [place], [place] [address]  Comprehensive metabolic panel     Status: Abnormal   Collection Time: 12/24/18  9:37 PM  Result Value Ref Range   Sodium 136 135 - 145 mmol/L   Potassium 3.8 3.5 - 5.1 mmol/L   Chloride 106 98 - 111 mmol/L   CO2 22 22 - 32 mmol/L   Glucose, Bld 98 70 - 99 mg/dL   BUN 18 8 - 23 mg/dL   Creatinine, Ser 0.70 0.44 - 1.00 mg/dL   Calcium 9.5 8.9 - 10.3 mg/dL   Total Protein 6.0 (L) 6.5 - 8.1 g/dL   Albumin 3.9 3.5 - 5.0 g/dL   AST 20 15 - 41 U/L   ALT 15 0 - 44 U/L   Alkaline Phosphatase 66 38 - 126 U/L   Total Bilirubin 0.5 0.3 - 1.2 mg/dL   GFR calc non Af Amer >60 >60 mL/min   GFR calc Af Amer >60 >60 mL/min   Anion gap 8 5 - 15    Comment: Performed at [facility] [address], [place], [place] [address]  I-stat chem 8, ED     Status: None   Collection Time: 12/24/18  9:45 PM  Result Value Ref Range   Sodium 136 135 - 145 mmol/L   Potassium 3.9 3.5 - 5.1 mmol/L   Chloride 104 98 - 111 mmol/L   BUN 20 8 - 23 mg/dL   Creatinine, Ser 0.60 0.44 - 1.00 mg/dL   Glucose, Bld 94 70 - 99 mg/dL   Calcium, Ion 1.19 1.15 - 1.40 mmol/L   TCO2 23 22 - 32 mmol/L   Hemoglobin 12.2 12.0 - 15.0 g/dL   HCT 36.0 36.0 - 46.0 %     CLINICAL DATA:  85 year old female with focal  neurologic deficit.  EXAM: CT HEAD WITHOUT CONTRAST  TECHNIQUE: Contiguous axial images were obtained from the base of the skull through the vertex without intravenous contrast.  COMPARISON:  None.  FINDINGS: Brain: There is age-related atrophy and chronic microvascular ischemic changes. There is no acute intracranial hemorrhage. No mass effect or midline shift. No extra-axial fluid collection.  Vascular: No hyperdense vessel or unexpected calcification.  Skull: Normal. Negative for fracture or focal lesion.  Sinuses/Orbits: No acute finding.  Other: None  IMPRESSION: 1. No acute  intracranial hemorrhage. 2. Age-related atrophy and chronic microvascular ischemic changes. ----------------------------------------------------------------------------------------------------------   MRI HEAD WITHOUT CONTRAST-done 6/4//2020.  TECHNIQUE: Multiplanar, multiecho pulse sequences of the brain and surrounding structures were obtained without intravenous contrast.  COMPARISON:  None.  FINDINGS: BRAIN: There is no acute infarct, acute hemorrhage or extra-axial collection. The midline structures are normal. No midline shift or other mass effect. Multifocal white matter hyperintensity, most commonly due to chronic ischemic microangiopathy. Generalized atrophy without lobar predilection. Single focus of chronic microhemorrhage in the left frontal white matter. No mass lesion.  VASCULAR: The major intracranial arterial and venous sinus flow voids are normal.  SKULL AND UPPER CERVICAL SPINE: Calvarial bone marrow signal is normal. There is no skull base mass. Visualized upper cervical spine and soft tissues are normal.  SINUSES/ORBITS: No fluid levels or advanced mucosal thickening. No mastoid or middle ear effusion. The orbits are normal.  IMPRESSION: Mild atrophy and chronic small vessel ischemia without acute intracranial abnormality.  Wt Readings from Last 3 Encounters:  01/07/19 138 lb (62.6 kg)  12/31/18 135 lb 4.8 oz (61.4 kg)  11/09/18 138 lb 3.2 oz (62.7 kg)   BP Readings from Last 3 Encounters:  01/07/19 101/63  12/31/18 117/65  12/25/18 (!) 138/59   Pulse Readings from Last 3 Encounters:  01/07/19 69  12/31/18 75  12/25/18 (!) 56   BMI Readings from Last 3 Encounters:  01/07/19 23.69 kg/m  12/31/18 23.22 kg/m  11/09/18 23.72 kg/m     Patient Care Team    Relationship Specialty Notifications Start End  [name], DO PCP - General Family Medicine  02/20/16   Elsie Saas, MD Consulting Physician Orthopedic Surgery  04/19/15    Foye Spurling, MD Consulting Physician Endocrinology  02/21/16   Hurman Horn, MD Consulting Physician Ophthalmology  02/21/16   Harriett Sine, MD Consulting Physician Dermatology  03/14/16   Angelia Mould, MD Consulting Physician Vascular Surgery  04/11/16   [name], MD Consulting Physician Orthopedic Surgery  66/29/47   Jodi Marble, MD Consulting Physician Otolaryngology  09/03/17   Jolene Provost, PA-C Physician Assistant Physician Assistant  09/03/17    Comment: ENT  Armbruster, Carlota Raspberry, MD Consulting Physician Gastroenterology  04/17/18   Dorothy Spark, MD Consulting Physician Cardiology  01/05/19      Patient Active Problem List   Diagnosis Date Noted  . Urgency incontinence 05/24/2016    Priority: High  . Hyperlipidemia 02/24/2016    Priority: High  . Vitamin D deficiency 02/22/2016    Priority: High  . ADD (attention deficit disorder) 02/19/2011    Priority: High  . Vegan diet 06/08/2018    Priority: Medium  . Incidental lung nodule-7 mm right middle lobe subpleural nodule 06/03/2017    Priority: Medium  . h/o low B12- on supp 02/24/2016    Priority: Medium  . thyroid disease- at time did not warrant txmnt 02/19/2011    Priority: Medium  . Family history of lung cancer-  brother 09/03/2017    Priority: Low  . Family history of stomach cancer and colon 09/03/2017    Priority: Low  . Chronic constipation 06/03/2017    Priority: Low  . Patient has healthcare proxy and living will 07/20/2016    Priority: Low  . OA (osteoarthritis) of knee 04/15/2016    Priority: Low  . Status post hysterectomy  02/21/2016    Priority: Low  . TIA (transient ischemic attack) 01/05/2019  . Environmental and seasonal allergies- aside from above sx 10/21/2018  . Exertional chest pain 10/21/2018  . SOB (shortness of breath) on exertion 10/21/2018  . Recurrent productive cough 08/31/2018  . Malaise and fatigue 08/31/2018  . Nail abnormalities-  longitudinal  nail ridges 06/08/2018  . 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 Hoarseness of voice 08/18/2017  . Urinary incontinence 05/23/2017  . Preoperative general physical examination 07/20/2016  . Chronic pain of left knee 04/11/2016  . Senile solar keratosis 03/14/2016  . Peripheral edema- bilateral lower extremities 03/14/2016  . ETD- Sx of L ear 03/14/2016  . Gen chronic Fatigue 02/24/2016  . Decreased hearing 02/19/2011    Past Medical history, Surgical history, Family history, Social history, Allergies and Medications have been entered into the medical record, reviewed and changed as needed.    Current Meds  Medication Sig  . acetaminophen (TYLENOL) 325 MG tablet Take 650 mg by mouth every 6 (six) hours as needed (for headaches).  . Biotin 10000 MCG TABS Take 10,000 mcg by mouth daily.   . Calcium-Magnesium (CAL/MAG CITRATE PO) Take 2 tablets by mouth daily after breakfast.  . Cholecalciferol (VITAMIN D-3 PO) Take 1 capsule by mouth daily with breakfast.  . docusate sodium (COLACE) 100 MG capsule Take 100 mg by mouth every other day.  . folic acid (FOLVITE) 1 MG tablet Take 1 mg daily by mouth.  Marland Kitchen GARCINIA CAMBOGIA-CHROMIUM PO Take 2 tablets by mouth daily.   . nitroGLYCERIN (NITROSTAT) 0.4 MG SL tablet Place 1 tablet (0.4 mg total) under the tongue every 5 (five) minutes as needed for chest pain.  . Polyethylene Glycol 3350 (MIRALAX PO) Take 17 g by mouth daily as needed (for constipation).   . rosuvastatin (CRESTOR) 10 MG tablet Take 1 tablet (10 mg total) by mouth at bedtime.  . Sodium Chloride, Hypertonic, (MURO 128 OP) Place 1 drop into both eyes See admin instructions. Instill 1 drop into each eye in the morning, then as needed daily for dryness/irritation  . vitamin C (ASCORBIC ACID) 500 MG tablet Take 500 mg daily by mouth.  . [DISCONTINUED] oxybutynin (DITROPAN-XL) 5 MG 24 hr tablet TAKE 1 TABLET BY MOUTH EVERYDAY AT BEDTIME (Patient taking differently: Take 5 mg by mouth at bedtime. )     Allergies:  Allergies  Allergen Reactions  . Flexeril [Cyclobenzaprine] Other (See Comments)    Caused excessive lethargy  . Food Other (See Comments)    Patient is a vegetarian (will eat ONLY FISH)  . Mold Extract [Trichophyton] Other (See Comments)    Runny nose (with dust, also)  . Pollen Extract Other (See Comments)    Runny nose  . Tape Other (See Comments)    Skin will tear with medical tape, but can tolerate paper tape only     Review of Systems:  A fourteen system review of systems was performed and found to be positive as per HPI.   Objective:   Blood pressure 117/65, pulse 75, temperature 98.3 F (36.8 C), height 5\' 4"  (1.626 m), weight 135 lb  4.8 oz (61.4 kg), SpO2 98 %. Body mass index is 23.22 kg/m. General:  Well Developed, well nourished, appropriate for stated age.  Neuro:  Alert and oriented,  extra-ocular muscles intact  HEENT:  Normocephalic, atraumatic, neck supple, no carotid bruits appreciated  Skin:  no gross rash, warm, pink. Cardiac:  RRR, S1 S2, no murmur Respiratory:  ECTA B/L and A/P, Not using accessory muscles, speaking in full sentences- unlabored. Vascular:  Ext warm, no cyanosis apprec.; cap RF less 2 sec. Psych:  No HI/SI, judgement and insight good, Euthymic mood. Full Affect. Neurological exam: Cranial nerves II through XII grossly intact, bilateral upper extremity lower extremity strength 5 out of 5 throughout and equal.  Sensation and two-point discrimination was completely intact x4 extremities and equal bilaterally.  Reflexes 2+ without without deficit.  Romberg negative, no pronator drift, finger-to-nose within normal limits.  No focal neurological deficits appreciated at all.

## 2019-01-05 ENCOUNTER — Ambulatory Visit: Payer: Medicare HMO | Admitting: Family Medicine

## 2019-01-05 DIAGNOSIS — Z8673 Personal history of transient ischemic attack (TIA), and cerebral infarction without residual deficits: Secondary | ICD-10-CM

## 2019-01-05 DIAGNOSIS — G459 Transient cerebral ischemic attack, unspecified: Secondary | ICD-10-CM | POA: Insufficient documentation

## 2019-01-05 HISTORY — DX: Personal history of transient ischemic attack (TIA), and cerebral infarction without residual deficits: Z86.73

## 2019-01-05 MED ORDER — ASPIRIN EC 81 MG PO TBEC: 81.0000 mg | DELAYED_RELEASE_TABLET | Freq: Every day | ORAL | 3 refills | Status: DC

## 2019-01-07 ENCOUNTER — Other Ambulatory Visit: Payer: Self-pay

## 2019-01-07 ENCOUNTER — Ambulatory Visit (INDEPENDENT_AMBULATORY_CARE_PROVIDER_SITE_OTHER): Payer: Medicare HMO | Admitting: Family Medicine

## 2019-01-07 ENCOUNTER — Encounter: Payer: Self-pay | Admitting: Family Medicine

## 2019-01-07 VITALS — BP 101/63 | HR 69 | Temp 98.4°F | Ht 64.0 in | Wt 138.0 lb

## 2019-01-07 DIAGNOSIS — R10812 Left upper quadrant abdominal tenderness: Secondary | ICD-10-CM | POA: Diagnosis not present

## 2019-01-07 DIAGNOSIS — R911 Solitary pulmonary nodule: Secondary | ICD-10-CM

## 2019-01-07 DIAGNOSIS — N3941 Urge incontinence: Secondary | ICD-10-CM | POA: Diagnosis not present

## 2019-01-07 MED ORDER — OXYBUTYNIN CHLORIDE ER 5 MG PO TB24: 5.0000 mg | ORAL_TABLET | Freq: Every day | ORAL | 2 refills | Status: DC

## 2019-01-07 NOTE — Progress Notes (Signed)
Impression and Recommendations:    1. Left upper quadrant abdominal tenderness without rebound tenderness/ under rib cage   2. h/o Nodule of lower lobe of left lung- ( along w R upper and R middle lobes)    3. Urgency incontinence      Left upper quadrant abdominal tenderness without rebound tenderness/ under rib cage  - Plan: CT Chest Wo Contrast,  -Worrisome for growth in left lower anterior lung/ chest wall  h/o Nodule of lower lobe of left lung- ( along w R upper and R middle lobe nodules)   - Plan: CT Chest Wo Contrast,  -Discussed with patient that CT scan would likely get lower chest and left upper abdomen and viewed for better visualization.  Urgency incontinence - Plan: oxybutynin (DITROPAN-XL) 5 MG 24 hr tablet, DISCONTINUED: oxybutynin (DITROPAN-XL) 5 MG 24 hr tablet, patient states the medicine works well for her she requested a refill.   Orders Placed This Encounter  Procedures  . CT Chest Wo Contrast    Meds ordered this encounter  Medications  . DISCONTD: oxybutynin (DITROPAN-XL) 5 MG 24 hr tablet    Sig: Take 1 tablet (5 mg total) by mouth at bedtime.    Dispense:  30 tablet    Refill:  2  . oxybutynin (DITROPAN-XL) 5 MG 24 hr tablet    Sig: Take 1 tablet (5 mg total) by mouth at bedtime.    Dispense:  30 tablet    Refill:  2    Medications Discontinued During This Encounter  Medication Reason  . oxybutynin (DITROPAN-XL) 5 MG 24 hr tablet Reorder  . oxybutynin (DITROPAN-XL) 5 MG 24 hr tablet Reorder     Gross side effects, risk and benefits, and alternatives of medications and treatment plan in general discussed with patient.  Patient is aware that all medications have potential side effects and we are unable to predict every side effect or drug-drug interaction that may occur.   Patient will call with any questions prior to using medication if they have concerns.    Expresses verbal understanding and consents to current therapy and treatment  regimen.  No barriers to understanding were identified.  Red flag symptoms and signs discussed in detail.  Patient expressed understanding regarding what to do in case of emergency\urgent symptoms  Please see AVS handed out to patient at the end of our visit for further patient instructions/ counseling done pertaining to today's office visit.   Return for f/up 1 mo- l lower chest/ upper abd pain.     Note:  This note was prepared with assistance of Dragon voice recognition software. Occasional wrong-word or sound-a-like substitutions may have occurred due to the inherent limitations of voice recognition software.  [name], DO  01/07/2019 4:03 PM      --------------------------------------------------------------------------------------------------------------------------------------------------------------------------------------------------------------------------------------------    Subjective:     HPI: Cheryl Martin is a 85 y.o. female who presents to [facility] today for issues as discussed below.   L upper abd ache- comes and goes. For several months.   Feels aching no bloating, stools-goes back and forth with constipation and normal stooling.  No fever chills.  No radiation -Worse with eating-  -better if she stretches out/ lies out flat.  - getting W and W.    Wt Readings from Last 3 Encounters:  01/07/19 138 lb (62.6 kg)  12/31/18 135 lb 4.8 oz (61.4 kg)  11/09/18 138 lb 3.2 oz (62.7 kg)  BP Readings from Last 3 Encounters:  01/07/19 101/63  12/31/18 117/65  12/25/18 (!) 138/59   Pulse Readings from Last 3 Encounters:  01/07/19 69  12/31/18 75  12/25/18 (!) 56   BMI Readings from Last 3 Encounters:  01/07/19 23.69 kg/m  12/31/18 23.22 kg/m  11/09/18 23.72 kg/m     Patient Care Team    Relationship Specialty Notifications Start End  [name], DO PCP - General Family Medicine  02/20/16   Elsie Saas,  MD Consulting Physician Orthopedic Surgery  04/19/15   Foye Spurling, MD Consulting Physician Endocrinology  02/21/16   Zadie Rhine, Clent Demark, MD Consulting Physician Ophthalmology  02/21/16   Harriett Sine, MD Consulting Physician Dermatology  03/14/16   Angelia Mould, MD Consulting Physician Vascular Surgery  04/11/16   [name], MD Consulting Physician Orthopedic Surgery  40/98/11   Jodi Marble, MD Consulting Physician Otolaryngology  09/03/17   Jolene Provost, PA-C Physician Assistant Physician Assistant  09/03/17    Comment: ENT  Armbruster, Carlota Raspberry, MD Consulting Physician Gastroenterology  04/17/18   Dorothy Spark, MD Consulting Physician Cardiology  01/05/19      Patient Active Problem List   Diagnosis Date Noted  . Urgency incontinence 05/24/2016    Priority: High  . Hyperlipidemia 02/24/2016    Priority: High  . Vitamin D deficiency 02/22/2016    Priority: High  . ADD (attention deficit disorder) 02/19/2011    Priority: High  . Vegan diet 06/08/2018    Priority: Medium  . Incidental lung nodule-7 mm right middle lobe subpleural nodule 06/03/2017    Priority: Medium  . h/o low B12- on supp 02/24/2016    Priority: Medium  . thyroid disease- at time did not warrant txmnt 02/19/2011    Priority: Medium  . Family history of lung cancer- brother 09/03/2017    Priority: Low  . Family history of stomach cancer and colon 09/03/2017    Priority: Low  . Chronic constipation 06/03/2017    Priority: Low  . Patient has healthcare proxy and living will 07/20/2016    Priority: Low  . OA (osteoarthritis) of knee 04/15/2016    Priority: Low  . Status post hysterectomy  02/21/2016    Priority: Low  . TIA (transient ischemic attack) 01/05/2019  . Environmental and seasonal allergies- aside from above sx 10/21/2018  . Exertional chest pain 10/21/2018  . SOB (shortness of breath) on exertion 10/21/2018  . Recurrent productive cough 08/31/2018  . Malaise and  fatigue 08/31/2018  . Nail abnormalities-  longitudinal nail ridges 06/08/2018  . 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 Hoarseness of voice 08/18/2017  . Urinary incontinence 05/23/2017  . Preoperative general physical examination 07/20/2016  . Chronic pain of left knee 04/11/2016  . Senile solar keratosis 03/14/2016  . Peripheral edema- bilateral lower extremities 03/14/2016  . ETD- Sx of L ear 03/14/2016  . Gen chronic Fatigue 02/24/2016  . Decreased hearing 02/19/2011    Past Medical history, Surgical history, Family history, Social history, Allergies and Medications have been entered into the medical record, reviewed and changed as needed.    Current Meds  Medication Sig  . acetaminophen (TYLENOL) 325 MG tablet Take 650 mg by mouth every 6 (six) hours as needed (for headaches).  Marland Kitchen aspirin EC 81 MG tablet Take 1 tablet (81 mg total) by mouth daily.  . Biotin 10000 MCG TABS Take 10,000 mcg by mouth daily.   . Calcium-Magnesium (CAL/MAG CITRATE PO) Take 2 tablets by mouth daily after breakfast.  .  Cholecalciferol (VITAMIN D-3 PO) Take 1 capsule by mouth daily with breakfast.  . docusate sodium (COLACE) 100 MG capsule Take 100 mg by mouth every other day.  . folic acid (FOLVITE) 1 MG tablet Take 1 mg daily by mouth.  Marland Kitchen GARCINIA CAMBOGIA-CHROMIUM PO Take 2 tablets by mouth daily.   . nitroGLYCERIN (NITROSTAT) 0.4 MG SL tablet Place 1 tablet (0.4 mg total) under the tongue every 5 (five) minutes as needed for chest pain.  Marland Kitchen oxybutynin (DITROPAN-XL) 5 MG 24 hr tablet Take 1 tablet (5 mg total) by mouth at bedtime.  . Polyethylene Glycol 3350 (MIRALAX PO) Take 17 g by mouth daily as needed (for constipation).   . rosuvastatin (CRESTOR) 10 MG tablet Take 1 tablet (10 mg total) by mouth at bedtime.  . Sodium Chloride, Hypertonic, (MURO 128 OP) Place 1 drop into both eyes See admin instructions. Instill 1 drop into each eye in the morning, then as needed daily for dryness/irritation  . vitamin C  (ASCORBIC ACID) 500 MG tablet Take 500 mg daily by mouth.  . [DISCONTINUED] oxybutynin (DITROPAN-XL) 5 MG 24 hr tablet TAKE 1 TABLET BY MOUTH EVERYDAY AT BEDTIME (Patient taking differently: Take 5 mg by mouth at bedtime. )  . [DISCONTINUED] oxybutynin (DITROPAN-XL) 5 MG 24 hr tablet Take 1 tablet (5 mg total) by mouth at bedtime.    Allergies:  Allergies  Allergen Reactions  . Flexeril [Cyclobenzaprine] Other (See Comments)    Caused excessive lethargy  . Food Other (See Comments)    Patient is a vegetarian (will eat ONLY FISH)  . Mold Extract [Trichophyton] Other (See Comments)    Runny nose (with dust, also)  . Pollen Extract Other (See Comments)    Runny nose  . Tape Other (See Comments)    Skin will tear with medical tape, but can tolerate paper tape only     Review of Systems:  A fourteen system review of systems was performed and found to be positive as per HPI.   Objective:   Blood pressure 101/63, pulse 69, temperature 98.4 F (36.9 C), height 5\' 4"  (1.626 m), weight 138 lb (62.6 kg), SpO2 97 %. Body mass index is 23.69 kg/m. General:  Well Developed, well nourished, appropriate for stated age.  Neuro:  Alert and oriented,  extra-ocular muscles intact  HEENT:  Normocephalic, atraumatic, neck supple, no carotid bruits appreciated  Skin:  no gross rash, warm, pink. Cardiac:  RRR, S1 S2 Respiratory:  ECTA B/L and A/P, Not using accessory muscles, speaking in full sentences- unlabored. Chest: There is a prominence of the left anterior lower rib cage versus the right on supine inspection.  + fullness to L ant lower anterior chest wall versus R Abd: Positive bowel sounds x4 hyperactive in upper quadrants, no guarding rigidity or rebound.  There is a fullness underneath anterior rib cage in the left upper abdomen.  This is tender to deep palpation Vascular:  Ext warm, no cyanosis apprec.; cap RF less 2 sec. Psych:  No HI/SI, judgement and insight good, Euthymic mood. Full  Affect.

## 2019-01-12 ENCOUNTER — Ambulatory Visit
Admission: RE | Admit: 2019-01-12 | Discharge: 2019-01-12 | Disposition: A | Discharge status: Home / Self Care | Payer: Medicare HMO | Source: Ambulatory Visit | Attending: Family Medicine | Admitting: Family Medicine

## 2019-01-12 DIAGNOSIS — R918 Other nonspecific abnormal finding of lung field: Secondary | ICD-10-CM | POA: Diagnosis not present

## 2019-01-12 DIAGNOSIS — J479 Bronchiectasis, uncomplicated: Secondary | ICD-10-CM | POA: Diagnosis not present

## 2019-01-12 DIAGNOSIS — R911 Solitary pulmonary nodule: Secondary | ICD-10-CM

## 2019-01-12 DIAGNOSIS — R10812 Left upper quadrant abdominal tenderness: Secondary | ICD-10-CM

## 2019-01-21 ENCOUNTER — Other Ambulatory Visit: Payer: Self-pay

## 2019-01-21 DIAGNOSIS — R1902 Left upper quadrant abdominal swelling, mass and lump: Secondary | ICD-10-CM

## 2019-01-21 DIAGNOSIS — J479 Bronchiectasis, uncomplicated: Secondary | ICD-10-CM

## 2019-02-03 ENCOUNTER — Ambulatory Visit
Admission: RE | Admit: 2019-02-03 | Discharge: 2019-02-03 | Disposition: A | Discharge status: Home / Self Care | Payer: Medicare HMO | Source: Ambulatory Visit | Attending: Family Medicine | Admitting: Family Medicine

## 2019-02-03 DIAGNOSIS — Z9049 Acquired absence of other specified parts of digestive tract: Secondary | ICD-10-CM | POA: Diagnosis not present

## 2019-02-03 DIAGNOSIS — R1902 Left upper quadrant abdominal swelling, mass and lump: Secondary | ICD-10-CM

## 2019-02-09 ENCOUNTER — Telehealth: Payer: Self-pay | Admitting: Family Medicine

## 2019-02-09 DIAGNOSIS — R1012 Left upper quadrant pain: Secondary | ICD-10-CM

## 2019-02-09 DIAGNOSIS — R195 Other fecal abnormalities: Secondary | ICD-10-CM

## 2019-02-09 NOTE — Telephone Encounter (Signed)
Patient called for status of Ultra Sound results, advised provider & med assistant out of office today & would forward message for response at 1st available moment to staff.  --glh

## 2019-02-10 ENCOUNTER — Ambulatory Visit: Payer: Medicare HMO | Admitting: Family Medicine

## 2019-02-10 NOTE — Telephone Encounter (Addendum)
Called patient and she states that the pain is still the same. Offered patient an appointment with Mina Marble, NP patient declined.  Patient states that symptoms remain unchanged and that she just wants to know what the next steps in treatment plan should be.    Advised her ultrasound results were normal and that Dr. Raliegh Scarlet is currently out of the office and that I will call her back Monday after Dr. Raliegh Scarlet can review and advise. MPulliam, CMA/RT(R)

## 2019-02-16 NOTE — Telephone Encounter (Signed)
Patient is still having the LUQ pain per patient no change.  Please review and advise. MPulliam, CMA/RT(R)

## 2019-02-16 NOTE — Telephone Encounter (Signed)
Called patient to notify, left message for patient to call the office back. MPulliam, CMA/RT(R)

## 2019-02-16 NOTE — Telephone Encounter (Signed)
-   See the below HPI from her last office visit with me.  Because she continues to have left upper abdominal achiness for several months now and irregular stools, it is best we send her to gastroenterology for further evaluation/or her seek further evaluation through her gastroenterologist.  Please put in referral if she does not have one.  Or, please have her follow-up with her current gastroenterologist if she already has 1.    -And thank you for documenting that you already discussed ultrasound results with her last week, that she declined office visit with our nurse practitioner last week etc.  -Dr. Raliegh Scarlet  HPI from last OV:  L upper abd ache- comes and goes. For several months.   Feels aching no bloating, stools-goes back and forth with constipation and normal stooling.  No fever chills.  No radiation -Worse with eating-  -better if she stretches out/ lies out flat.  - getting W and W.

## 2019-02-17 ENCOUNTER — Other Ambulatory Visit: Payer: Self-pay

## 2019-02-17 ENCOUNTER — Ambulatory Visit (INDEPENDENT_AMBULATORY_CARE_PROVIDER_SITE_OTHER): Payer: Medicare HMO | Admitting: Family Medicine

## 2019-02-17 ENCOUNTER — Encounter: Payer: Self-pay | Admitting: Family Medicine

## 2019-02-17 VITALS — Ht 64.0 in | Wt 134.0 lb

## 2019-02-17 DIAGNOSIS — R1012 Left upper quadrant pain: Secondary | ICD-10-CM

## 2019-02-17 DIAGNOSIS — K449 Diaphragmatic hernia without obstruction or gangrene: Secondary | ICD-10-CM | POA: Insufficient documentation

## 2019-02-17 DIAGNOSIS — I700 Atherosclerosis of aorta: Secondary | ICD-10-CM | POA: Diagnosis not present

## 2019-02-17 DIAGNOSIS — I2510 Atherosclerotic heart disease of native coronary artery without angina pectoris: Secondary | ICD-10-CM | POA: Diagnosis not present

## 2019-02-17 NOTE — Progress Notes (Signed)
Virtual / live video office visit note for [employer], D.O- Primary Care Physician at [facility]   I connected with current patient today and beyond visually recognizing the correct individual, I verified that I am speaking with the correct person using two identifiers.  . Location of the patient: Home . Location of the provider: Office Only the patient (+/- their family members at pt's discretion) and myself were participating in the encounter    - This visit type was conducted due to national recommendations for restrictions regarding the COVID-19 Pandemic (e.g. social distancing) in an effort to limit this patient's exposure and mitigate transmission in our community.  This format is felt to be most appropriate for this patient at this time.   - The patient did have access to video technology today yet, we had technical difficulties with this method, requiring transitioning to audio only.    - No physical exam could be performed with this format, beyond that communicated to [place] by the patient/ family members as noted.   - Additionally my office staff/ schedulers discussed with the patient that there may be a monetary charge related to this service, depending on patient's medical insurance.   The patient expressed understanding, and agreed to proceed.      History of Present Illness:  Chronic LUQ Abdominal Pain  Notes she does often worry about the cause of her LUQ pain, fearing things such as blood clot, cancer, worsening hiatal hernia, etc.  Patient states that her pain comes and goes.  It is not worse or changed from prior.  She has had a negative CT scan of the chest and lower ribs as well as complete ultrasound of abdomen  The pain typically lasts 20-30 minutes per episode.  When she lies down, the pain subsides.  Yesterday was the most recent time that she experienced the pain.  "The only way I can alleviate the pain is to lay down flat, otherwise it feels like my ribs  are irritating it."  States she hadn't eaten anything prior to the pain, and it was then about 2 o'clock in the afternoon.  Was getting her lunch ready and went ahead and ate because she thought the pain might have been happening due to hunger.  Describes it as "real pain," a "sharp pain" so that I have to bend backwards to try to alleviate it."  "It feels like my ribs are jabbing into something."  "It's down deep, and I don't know what's right under there."  S ays she had a colonoscopy last year or a year before and it didn't show anything but a polyp, which was removed (November of 2018).   Additionally, patient had an upper endoscopy January 9th of 2019 with Dr. Havery Moros.  She was told she has acid reflux, and states "I don't have acid reflux."  She was prescribed medicine at that time and is not taking the medication "because I don't have acid reflux and I don't think I should take medicine when I don't have a problem."  Notes concerns about recent comprehensive metabolic panel which came back with borderline low total protein.  Denies change or concern in her bowel movements.    Impression and Recommendations:    1. Left upper quadrant pain   2. Hernia, hiatal   3. Aortic atherosclerosis ([place])- seen on Ct Scan   4. Coronary arteriosclerosis- seen on CT scan     Chronic LUQ Abdominal Pain - Last OV  01/07/2019 -Issue of Moderate complexity.  Reviewed with pt that: - CT chest obtained.  No acute findings apprecaited in upper abdomen. - Complete abdominal ultrasound obtained; no abnormal findings. - Explained to patient that these results are reassuring.  - Patient with large hiatal hernia present (seen by Dr. Havery Moros during endoscopy in January 2019). - Discussed that her hiatal hernia may be contributing to pain, but requires further investigation. - As patient continues experiencing LUQ pain, advised returning to Dr. Havery Moros of GI for further evaluation at this time  -  Patient established with Dr. Havery Moros and knows to call and set up an appointment.  - Extensively educated patient regarding results of her recent CT and ultrasound imaging.   - Advised patient to keep a journal of her LUQ pain, noting what makes it worse, what makes it better, what seems to be related to the pain, etc.  - Explained that given patient's age, atherosclerosis is an expected incidental finding for her CT scan of chest. - Reviewed red flag cardiac symptoms with patient today.  All questions were answered. - Advised patient to consult with cardiologist regarding her additional concerns about coronary artery disease- told pt this is non-specific finding and something we would expect in someone her age.   - Patient does not understand how she has atherosclerotic disease since she has been a vegan for many years.. Explained mech to pt  - Patient obtained myocardial perfusion study of the heart 10/26/2018- again reassurred pt of this.  These tests were all reviewed with pt again today.  - Encouraged patient to ask cardiology regarding concerns about cardiac health. - Message sent to Dr. Meda Coffee regarding patient's questions  about atherosclerosis found on CT scan, and questions regarding recent stress test.  - Additionally advised patient that her recent CMP is essentially normal and reassuring.  Advised patient to incorporate a protein shake if desired to increase her protein intake.  - As part of my medical decision making, I reviewed the following data within the [place] History obtained from pt /family, CMA notes reviewed and incorporated if applicable, Labs reviewed, Radiograph/ tests reviewed if applicable and OV notes from prior OV's with me, as well as other specialists she/he has seen since seeing me last, were all reviewed and used in my medical decision making process today.   - Additionally, discussion had with patient regarding txmnt plan, their biases  about that plan etc were used in my medical decision making today.   - The patient agreed with the plan and demonstrated an understanding of the instructions.   No barriers to understanding were identified.   - Red flag symptoms and signs discussed in detail.  Patient expressed understanding regarding what to do in case of emergency\ urgent symptoms.  The patient was advised to call back or seek an in-person evaluation if the symptoms worsen or if the condition fails to improve as anticipated. - Health education was provided and all questions were answered.  Return for F-up of current chronic med issues in 26mo or sooner.   I provided 28+ minutes of non-face-to-face time during this encounter,with over 50% of the time in direct counseling on patients medical conditions/ medical concerns.  Additional time was spent with charting and coordination of care after the actual visit commenced.   Note:  This note was prepared with assistance of Dragon voice recognition software. Occasional wrong-word or sound-a-like substitutions may have occurred due to the inherent limitations of voice recognition software.  This document serves as a record of services personally performed by [name], DO. It was created on her behalf by Toni Amend, a trained medical scribe. The creation of this record is based on the scribe's personal observations and the provider's statements to them.   I have reviewed the above medical documentation for accuracy and completeness and I concur.  [name], DO 02/17/2019 9:29 PM    Patient Care Team    Relationship Specialty Notifications Start End  [name], DO PCP - General Family Medicine  02/20/16   Elsie Saas, MD Consulting Physician Orthopedic Surgery  04/19/15   Foye Spurling, MD Consulting Physician Endocrinology  02/21/16   Hurman Horn, MD Consulting Physician Ophthalmology  02/21/16   Harriett Sine, MD Consulting Physician Dermatology   03/14/16   Angelia Mould, MD Consulting Physician Vascular Surgery  04/11/16   [name], MD Consulting Physician Orthopedic Surgery  31/49/70   Jodi Marble, MD Consulting Physician Otolaryngology  09/03/17   Jolene Provost, PA-C Physician Assistant Physician Assistant  09/03/17    Comment: ENT  Armbruster, Carlota Raspberry, MD Consulting Physician Gastroenterology  04/17/18   Dorothy Spark, MD Consulting Physician Cardiology  01/05/19     -Vitals obtained; medications/ allergies reconciled;  personal medical, social, Sx etc.histories were updated by CMA, reviewed by me and are reflected in chart  Patient Active Problem List   Diagnosis Date Noted  . Urgency incontinence 05/24/2016    Priority: High  . Hyperlipidemia 02/24/2016    Priority: High  . Vitamin D deficiency 02/22/2016    Priority: High  . ADD (attention deficit disorder) 02/19/2011    Priority: High  . Vegan diet 06/08/2018    Priority: Medium  . Incidental lung nodule-7 mm right middle lobe subpleural nodule 06/03/2017    Priority: Medium  . h/o low B12- on supp 02/24/2016    Priority: Medium  . thyroid disease- at time did not warrant txmnt 02/19/2011    Priority: Medium  . Family history of lung cancer- brother 09/03/2017    Priority: Low  . Family history of stomach cancer and colon 09/03/2017    Priority: Low  . Chronic constipation 06/03/2017    Priority: Low  . Patient has healthcare proxy and living will 07/20/2016    Priority: Low  . OA (osteoarthritis) of knee 04/15/2016    Priority: Low  . Status post hysterectomy  02/21/2016    Priority: Low  . Bronchiectasis without complication ([place]) 26/37/8588  . Multiple pulmonary nodules assoc with bronchiectasis 03/02/2019  . Hernia, hiatal 02/17/2019  . TIA (transient ischemic attack) 01/05/2019  . Environmental and seasonal allergies- aside from above sx 10/21/2018  . Exertional chest pain 10/21/2018  . SOB (shortness of breath) on exertion  10/21/2018  . Recurrent productive cough 08/31/2018  . Malaise and fatigue 08/31/2018  . Nail abnormalities-  longitudinal nail ridges 06/08/2018  . 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 Hoarseness of voice 08/18/2017  . Urinary incontinence 05/23/2017  . Preoperative general physical examination 07/20/2016  . Chronic pain of left knee 04/11/2016  . Senile solar keratosis 03/14/2016  . Peripheral edema- bilateral lower extremities 03/14/2016  . ETD- Sx of L ear 03/14/2016  . Gen chronic Fatigue 02/24/2016  . Decreased hearing 02/19/2011     Current Meds  Medication Sig  . acetaminophen (TYLENOL) 325 MG tablet Take 650 mg by mouth every 6 (six) hours as needed (for headaches).  Marland Kitchen aspirin EC 81 MG tablet Take 1 tablet (81 mg  total) by mouth daily.  . Biotin 10000 MCG TABS Take 10,000 mcg by mouth daily.   . Calcium-Magnesium (CAL/MAG CITRATE PO) Take 2 tablets by mouth daily after breakfast.  . Cholecalciferol (VITAMIN D-3 PO) Take 1 capsule by mouth daily with breakfast.  . Docusate Calcium (STOOL SOFTENER PO) Take 1 tablet by mouth every other day.  . folic acid (FOLVITE) 1 MG tablet Take 1 mg daily by mouth.  Marland Kitchen GARCINIA CAMBOGIA-CHROMIUM PO Take 2 tablets by mouth daily.   . nitroGLYCERIN (NITROSTAT) 0.4 MG SL tablet Place 1 tablet (0.4 mg total) under the tongue every 5 (five) minutes as needed for chest pain.  Marland Kitchen oxybutynin (DITROPAN-XL) 5 MG 24 hr tablet Take 1 tablet (5 mg total) by mouth at bedtime.  . Polyethylene Glycol 3350 (MIRALAX PO) Take 17 g by mouth every other day.   . rosuvastatin (CRESTOR) 10 MG tablet Take 1 tablet (10 mg total) by mouth at bedtime.  . Sodium Chloride, Hypertonic, (MURO 128 OP) Place 1 drop into both eyes See admin instructions. Instill 1 drop into each eye in the morning, then as needed daily for dryness/irritation  . vitamin C (ASCORBIC ACID) 500 MG tablet Take 500 mg daily by mouth.     Allergies  Allergen Reactions  . Flexeril [Cyclobenzaprine]  Other (See Comments)    Caused excessive lethargy  . Food Other (See Comments)    Patient is a vegetarian (will eat ONLY FISH)  . Mold Extract [Trichophyton] Other (See Comments)    Runny nose (with dust, also)  . Pollen Extract Other (See Comments)    Runny nose  . Tape Other (See Comments)    Skin will tear with medical tape, but can tolerate paper tape only     ROS:  See above HPI for pertinent positives and negatives   Objective:   Height 5\' 4"  (1.626 m), weight 134 lb (60.8 kg).  (if some vitals are omitted, this means that patient was UNABLE to obtain them even though they were asked to get them prior to OV today.  They were asked to call us at their earliest convenience with these once obtained.)  General: A & O * 3; visually in no acute distress; in usual state of health.  Skin: Visible skin appears normal and pt's usual skin color HEENT:  EOMI, head is normocephalic and atraumatic.  Sclera are anicteric. Neck has a good range of motion.  Lips are noncyanotic Chest: normal chest excursion and movement Respiratory: speaking in full sentences, no conversational dyspnea; no use of accessory muscles Psych: insight good, mood- appears full

## 2019-02-17 NOTE — Telephone Encounter (Signed)
Patient had OV with Dr. Raliegh Scarlet today 02/17/2019. MPulliam, CMA/RT(R)

## 2019-03-02 ENCOUNTER — Ambulatory Visit: Payer: Medicare HMO | Admitting: Internal Medicine

## 2019-03-02 ENCOUNTER — Other Ambulatory Visit: Payer: Self-pay

## 2019-03-02 ENCOUNTER — Other Ambulatory Visit (INDEPENDENT_AMBULATORY_CARE_PROVIDER_SITE_OTHER): Payer: Medicare HMO

## 2019-03-02 ENCOUNTER — Encounter: Payer: Self-pay | Admitting: Internal Medicine

## 2019-03-02 DIAGNOSIS — J479 Bronchiectasis, uncomplicated: Secondary | ICD-10-CM | POA: Diagnosis not present

## 2019-03-02 DIAGNOSIS — R918 Other nonspecific abnormal finding of lung field: Secondary | ICD-10-CM | POA: Insufficient documentation

## 2019-03-02 LAB — CBC WITH DIFFERENTIAL/PLATELET
Basophils Absolute: 0.1 10*3/uL (ref 0.0–0.1)
Basophils Relative: 1.2 % (ref 0.0–3.0)
Eosinophils Absolute: 0.1 10*3/uL (ref 0.0–0.7)
Eosinophils Relative: 2.6 % (ref 0.0–5.0)
HCT: 39 % (ref 36.0–46.0)
Hemoglobin: 13.2 g/dL (ref 12.0–15.0)
Lymphocytes Relative: 24.8 % (ref 12.0–46.0)
Lymphs Abs: 1.4 10*3/uL (ref 0.7–4.0)
MCHC: 33.7 g/dL (ref 30.0–36.0)
MCV: 92.2 fl (ref 78.0–100.0)
Monocytes Absolute: 0.5 10*3/uL (ref 0.1–1.0)
Monocytes Relative: 8.9 % (ref 3.0–12.0)
Neutro Abs: 3.5 10*3/uL (ref 1.4–7.7)
Neutrophils Relative %: 62.5 % (ref 43.0–77.0)
Platelets: 186 10*3/uL (ref 150.0–400.0)
RBC: 4.23 Mil/uL (ref 3.87–5.11)
RDW: 13 % (ref 11.5–15.5)
WBC: 5.5 10*3/uL (ref 4.0–10.5)

## 2019-03-02 NOTE — Patient Instructions (Addendum)
GERD (REFLUX)  is an extremely common cause of respiratory symptoms just like yours , many times with no obvious heartburn at all.    It can be treated with medication, but also with lifestyle changes including elevation of the head of your bed (ideally with 6-8inch blocks under the headboard of your bed),  Smoking cessation, avoidance of late meals, excessive alcohol, and avoid fatty foods, chocolate, peppermint, colas, red wine, and acidic juices such as orange juice.  NO MINT OR MENTHOL PRODUCTS SO NO COUGH DROPS  USE SUGARLESS CANDY INSTEAD (Jolley ranchers or Stover's or Life Savers) or even ice chips will also do - the key is to swallow to prevent all throat clearing. NO OIL BASED VITAMINS - use powdered substitutes.  Avoid fish oil when coughing.     Bronchiectasis =   you have scarring of your bronchial tubes which means that they don't function perfectly normally and mucus tends to pool in certain areas of your lung which can cause pneumonia and further scarring of your lung and bronchial tubes  Whenever you develop cough congestion take mucinex or mucinex dm up to 1200 mg every 12 hours as needed  > these will help keep the mucus loose and flowing but if your condition worsens you need to seek help immediately preferably here or somewhere inside the [facility] system to compare xrays ( worse = darker or bloody mucus or pain on breathing in)     Please remember to go to the lab department   for your tests - we will call you with the results when they are available.   Please schedule a follow up office visit in 6 weeks, call sooner if needed with pfts        .

## 2019-03-02 NOTE — Progress Notes (Signed)
Cheryl Martin, female    DOB: Dec 07, 1930,    MRN: [id number]   Brief patient profile:  26  yowf quit smoking 1975 moved from [place] with recurrent episodes of  bronchitis variable times every since arrival in [place] with some seasonal rhinitis esp in Spring and abnormal cxr since around 2017 with new dx of bronchiectasis by CT chest  01/12/2019 so referred to pulmonary clinic 03/02/2019 by Dr   Raliegh Scarlet     History of Present Illness  03/02/2019  Pulmonary/ 1st office eval/Aevah Stansbery  Chief Complaint  Patient presents with  . Pulmonary Consult    Referred by Dr Raliegh Scarlet for bronchiectasis.    Dyspnea:  Can walk 25 min flat around a track each lap takes a min avg pace -faster most people her age Cough: just with flares / really not significant production chronically, just during flares of what she's been calling bronchitis which is only once a year at most.  Sleep: nasal congestion but doesn't keep her up, one pillow  SABA use: none New abd pain x 4 months no relation with meals   No obvious day to day or daytime variability or assoc excess/ purulent sputum or mucus plugs or hemoptysis or cp or chest tightness, subjective wheeze or overt sinus or hb symptoms.   Sleeping ok  without nocturnal  or early am exacerbation  of respiratory  c/o's or need for noct saba. Also denies any obvious fluctuation of symptoms with weather or environmental changes or other aggravating or alleviating factors except as outlined above   No unusual exposure hx or h/o childhood pna/ asthma or knowledge of premature birth.  Current Allergies, Complete Past Medical History, Past Surgical History, Family History, and Social History were reviewed in [employer] record.  ROS  The following are not active complaints unless bolded Hoarseness, sore throat, dysphagia, dental problems, itching, sneezing,  nasal congestion or discharge of excess mucus or purulent secretions, ear ache,   fever, chills,  sweats, unintended wt loss or wt gain, classically pleuritic or exertional cp,  orthopnea pnd or arm/hand swelling  or leg swelling, presyncope, palpitations,  abd pain, anorexia, nausea, vomiting, diarrhea  or change in bowel habits or change in bladder habits, change in stools or change in urine, dysuria, hematuria,  rash, arthralgias, visual complaints, headache, numbness, weakness or ataxia or problems with walking or coordination,  change in mood or  memory.           Past Medical History:  Diagnosis Date  . ADD (attention deficit disorder)   . Allergy   . Anemia    as a teenager  . Arthritis   . Complication of anesthesia    slow to wake up after colonoscopy  . Edema   . Hearing loss   . History of hiatal hernia    hx of  . Hypothyroidism    hx of as child and during pregnancy  . Neuromuscular disorder ([place])   . Nocturia   . Pneumonia    hx of after bronchitis  . PONV (postoperative nausea and vomiting)   . Thyroid disease   Hands feet swelling  Outpatient Medications Prior to Visit  Medication Sig Dispense Refill  . acetaminophen (TYLENOL) 325 MG tablet Take 650 mg by mouth every 6 (six) hours as needed (for headaches).    Marland Kitchen aspirin EC 81 MG tablet Take 1 tablet (81 mg total) by mouth daily. 90 tablet 3  . Biotin 10000 MCG TABS Take  10,000 mcg by mouth daily.     . Calcium-Magnesium (CAL/MAG CITRATE PO) Take 2 tablets by mouth daily after breakfast.    . Cholecalciferol (VITAMIN D-3 PO) Take 1 capsule by mouth daily with breakfast.    . Docusate Calcium (STOOL SOFTENER PO) Take 1 tablet by mouth every other day.    . folic acid (FOLVITE) 1 MG tablet Take 1 mg daily by mouth.    Marland Kitchen GARCINIA CAMBOGIA-CHROMIUM PO Take 2 tablets by mouth daily.     . nitroGLYCERIN (NITROSTAT) 0.4 MG SL tablet Place 1 tablet (0.4 mg total) under the tongue every 5 (five) minutes as needed for chest pain. 45 tablet 2  . oxybutynin (DITROPAN-XL) 5 MG 24 hr tablet Take 1 tablet (5 mg total) by  mouth at bedtime. 30 tablet 2  . Polyethylene Glycol 3350 (MIRALAX PO) Take 17 g by mouth every other day.     . rosuvastatin (CRESTOR) 10 MG tablet Take 1 tablet (10 mg total) by mouth at bedtime. 90 tablet 3  . Sodium Chloride, Hypertonic, (MURO 128 OP) Place 1 drop into both eyes See admin instructions. Instill 1 drop into each eye in the morning, then as needed daily for dryness/irritation    . vitamin C (ASCORBIC ACID) 500 MG tablet Take 500 mg daily by mouth.        Objective:     BP (!) 122/58 (BP Location: Left Arm, Cuff Size: Normal)   Pulse 60   Temp 98.1 F (36.7 C) (Oral)   Ht 5' 3.5" (1.613 m)   Wt 139 lb (63 kg)   SpO2 97% Comment: on RA  BMI 24.24 kg/m   SpO2: 97 %(on RA)    Pleasant amb wf nad   HEENT: nl dentition, turbinates bilaterally, and oropharynx. Nl external ear canals without cough reflex   NECK :  without JVD/Nodes/TM/ nl carotid upstrokes bilaterally   LUNGS: no acc muscle use,  Nl contour chest which is clear to A and P bilaterally without cough on insp or exp maneuvers   CV:  RRR  no s3 or murmur or increase in P2, and no edema   ABD:  soft and nontender with nl inspiratory excursion in the supine position. No bruits or organomegaly appreciated, bowel sounds nl  MS:  Nl gait/ ext warm without deformities, calf tenderness, cyanosis or clubbing No obvious joint restrictions   SKIN: warm and dry without lesions    NEURO:  alert, approp, nl sensorium with  no motor or cerebellar deficits apparent.          I personally reviewed images and agree with radiology impression as follows:   Chest CT 01/12/2019 1. Multiple small pulmonary nodules are stable and benign given a total of at least 18 months of stability. No further routine follow-up is required for these nodules. No new pulmonary nodules are noted.  2. There are foci of bronchiectasis, nodularity, and scarring, most conspicuous in the medial right upper lobe and lingula,  although also seen in the lung bases, consistent with chronic stigmata of atypical infection and unchanged from prior examinations.  3.  Coronary artery disease and aortic atherosclerosis.  4.  Hiatal hernia.  Labs ordered 03/02/2019  :  allergy profile   alpha one AT phenotype  / immune profile     Assessment   Bronchiectasis without complication ([place]) Onset of ? around 2017  - see CT chest 01/12/2019 - Labs ordered 03/02/2019  :  allergy profile   alpha  one AT phenotype  Immune profile    I discussed the pathophysiology of bronchiectasis in general terms including using the escalator analogy to help her understand the role of mucociliary function and the likelihood that she would have recurrent exacerbations as she got older.  She might benefit from bronchodilators if she has generalized airflow obstruction and therefore I am bringing her back for PFTs in 6 weeks pending the above work-up.     Multiple pulmonary nodules assoc with bronchiectasis This is likely due to mild colonization with MAI  And is an extremely common and relatively  benign condition in the elderly and does not warrant aggressive eval/ rx at this point unless there is a clinical correlation suggesting unaddressed pulmonary infection (purulent sputum, night sweats, unintended wt loss, doe) or evolution of  obvious changes on plain cxr (as opposed to serial CT, which is way over sensitive to make clinical decisions re intervention and treatment in the elderly, who tend to tolerate both dx and treatment poorly) .    Discussed in detail all the  indications, usual  risks and alternatives  relative to the benefits with patient who agrees to proceed with conservative f/u as outlined       Total time devoted to counseling  > 50 % of initial 60 min office visit:  review case with pt/ discussion of options/alternatives/ personally creating written customized instructions  in presence of pt  then going over those specific   Instructions directly with the pt including how to use all of the meds but in particular covering each new medication in detail and the difference between the maintenance= "automatic" meds and the prns using an action plan format for the latter (If this problem/symptom => do that organization reading Left to right).  Please see AVS from this visit for a full list of these instructions which I personally wrote for this pt and  are unique to this visit.   Christinia Gully, MD 03/02/2019

## 2019-03-02 NOTE — Assessment & Plan Note (Signed)
Onset of ? around 2017  - see CT chest 01/12/2019 - Labs ordered 03/02/2019  :  allergy profile   alpha one AT phenotype  Immune profile    I discussed the pathophysiology of bronchiectasis in general terms including using the escalator analogy to help her understand the role of mucociliary function and the likelihood that she would have recurrent exacerbations as she got older.  She might benefit from bronchodilators if she has generalized airflow obstruction and therefore I am bringing her back for PFTs in 6 weeks pending the above work-up.

## 2019-03-02 NOTE — Assessment & Plan Note (Signed)
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    Discussed in detail all the  indications, usual  risks and alternatives  relative to the benefits with patient who agrees to proceed with conservative f/u as outlined     Total time devoted to counseling  > 50 % of initial 60 min office visit:  review case with pt/ discussion of options/alternatives/ personally creating written customized instructions  in presence of pt  then going over those specific  Instructions directly with the pt including how to use all of the meds but in particular covering each new medication in detail and the difference between the maintenance= "automatic" meds and the prns using an action plan format for the latter (If this problem/symptom => do that organization reading Left to right).  Please see AVS from this visit for a full list of these instructions which I personally wrote for this pt and  are unique to this visit.

## 2019-03-07 LAB — RESPIRATORY ALLERGY PROFILE REGION II ~~LOC~~

## 2019-03-07 LAB — QUANTIFERON-TB GOLD PLUS
Mitogen-NIL: >10 IU/mL
NIL: 0.02 IU/mL
QuantiFERON-TB Gold Plus: NEGATIVE
TB1-NIL: 0.1 IU/mL
TB2-NIL: 0.08 IU/mL

## 2019-03-07 LAB — EXTRA SPECIMEN

## 2019-03-07 LAB — ALPHA-1 ANTITRYPSIN PHENOTYPE: A-1 Antitrypsin, Ser: 147 mg/dL (ref 83–199)

## 2019-03-07 LAB — IGG, IGA, IGM
IgG (Immunoglobin G), Serum: 462 mg/dL — ABNORMAL LOW (ref 600–1540)
IgM, Serum: 27 mg/dL — ABNORMAL LOW (ref 50–300)
Immunoglobulin A: 118 mg/dL (ref 70–320)

## 2019-03-07 LAB — INTERPRETATION:

## 2019-03-12 ENCOUNTER — Telehealth: Payer: Self-pay | Admitting: *Deleted

## 2019-03-12 ENCOUNTER — Other Ambulatory Visit: Payer: Self-pay | Admitting: *Deleted

## 2019-03-12 NOTE — Telephone Encounter (Signed)
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
## 2019-03-15 ENCOUNTER — Encounter: Payer: Self-pay | Admitting: *Deleted

## 2019-03-15 ENCOUNTER — Telehealth (INDEPENDENT_AMBULATORY_CARE_PROVIDER_SITE_OTHER): Payer: Medicare HMO | Admitting: Cardiology

## 2019-03-15 ENCOUNTER — Other Ambulatory Visit: Payer: Self-pay

## 2019-03-15 ENCOUNTER — Encounter: Payer: Self-pay | Admitting: Cardiology

## 2019-03-15 ENCOUNTER — Other Ambulatory Visit: Payer: Medicare HMO

## 2019-03-15 DIAGNOSIS — R079 Chest pain, unspecified: Secondary | ICD-10-CM | POA: Diagnosis not present

## 2019-03-15 DIAGNOSIS — E7800 Pure hypercholesterolemia, unspecified: Secondary | ICD-10-CM

## 2019-03-15 NOTE — Addendum Note (Signed)
Addended by: Nuala Alpha on: 03/15/2019 11:18 AM   Modules accepted: Orders

## 2019-03-15 NOTE — Addendum Note (Signed)
Addended by: Nuala Alpha on: 03/15/2019 11:00 AM   Modules accepted: Orders

## 2019-03-15 NOTE — Patient Instructions (Addendum)
Medication Instructions:    Your physician recommends that you continue on your current medications as directed. Please refer to the Current Medication list given to you today.  If you need a refill on your cardiac medications before your next appointment, please call your pharmacy.    Lab work:  YOU WILL COME INTO OUR OFFICE ON Wednesday 03/24/19 AT 9:45 AM TO HAVE YOUR LIPIDS AND CMET CHECKED.  PLEASE COME FASTING TO THIS LAB APPOINTMENT   If you have labs (blood work) drawn today and your tests are completely normal, you will receive your results only by: Marland Kitchen MyChart Message (if you have MyChart) OR . A paper copy in the mail If you have any lab test that is abnormal or we need to change your treatment, we will call you to review the results.    Follow-Up: At [facility], you and your health needs are our priority.  As part of our continuing mission to provide you with exceptional heart care, we have created designated Provider Care Teams.  These Care Teams include your primary Cardiologist (physician) and Advanced Practice Providers (APPs -  Physician Assistants and Nurse Practitioners) who all work together to provide you with the care you need, when you need it.  Your physician wants you to follow-up in: [place] will receive a reminder letter in the mail two months in advance. If you don't receive a letter, please call our office to schedule the follow-up appointment.

## 2019-03-24 ENCOUNTER — Other Ambulatory Visit: Payer: Self-pay

## 2019-03-24 ENCOUNTER — Other Ambulatory Visit: Payer: Medicare HMO | Admitting: *Deleted

## 2019-03-24 DIAGNOSIS — R079 Chest pain, unspecified: Secondary | ICD-10-CM

## 2019-03-24 DIAGNOSIS — E7800 Pure hypercholesterolemia, unspecified: Secondary | ICD-10-CM | POA: Diagnosis not present

## 2019-03-24 LAB — COMPREHENSIVE METABOLIC PANEL
ALT: 10 IU/L (ref 0–32)
AST: 19 IU/L (ref 0–40)
Albumin/Globulin Ratio: 2.9 — ABNORMAL HIGH (ref 1.2–2.2)
Albumin: 4.1 g/dL (ref 3.6–4.6)
Alkaline Phosphatase: 64 IU/L (ref 39–117)
BUN/Creatinine Ratio: 20 (ref 12–28)
BUN: 15 mg/dL (ref 8–27)
Bilirubin Total: 0.5 mg/dL (ref 0.0–1.2)
CO2: 25 mmol/L (ref 20–29)
Calcium: 9.3 mg/dL (ref 8.7–10.3)
Chloride: 104 mmol/L (ref 96–106)
Creatinine, Ser: 0.74 mg/dL (ref 0.57–1.00)
GFR calc Af Amer: 85 mL/min/{1.73_m2} (ref >59)
GFR calc non Af Amer: 74 mL/min/{1.73_m2} (ref >59)
Globulin, Total: 1.4 g/dL — ABNORMAL LOW (ref 1.5–4.5)
Glucose: 80 mg/dL (ref 65–99)
Potassium: 4.2 mmol/L (ref 3.5–5.2)
Sodium: 141 mmol/L (ref 134–144)
Total Protein: 5.5 g/dL — ABNORMAL LOW (ref 6.0–8.5)

## 2019-03-24 LAB — LIPID PANEL
Chol/HDL Ratio: 1.9 ratio (ref 0.0–4.4)
Cholesterol, Total: 144 mg/dL (ref 100–199)
HDL: 77 mg/dL (ref >39)
LDL Chol Calc (NIH): 52 mg/dL (ref 0–99)
Triglycerides: 76 mg/dL (ref 0–149)
VLDL Cholesterol Cal: 15 mg/dL (ref 5–40)

## 2019-04-08 ENCOUNTER — Encounter: Payer: Self-pay | Admitting: Gastroenterology

## 2019-04-08 ENCOUNTER — Ambulatory Visit: Payer: Medicare HMO | Admitting: Gastroenterology

## 2019-04-08 VITALS — BP 110/62 | HR 70 | Temp 97.7°F | Ht 63.0 in | Wt 137.0 lb

## 2019-04-08 DIAGNOSIS — R1012 Left upper quadrant pain: Secondary | ICD-10-CM | POA: Diagnosis not present

## 2019-04-08 DIAGNOSIS — K449 Diaphragmatic hernia without obstruction or gangrene: Secondary | ICD-10-CM | POA: Diagnosis not present

## 2019-04-08 DIAGNOSIS — K5900 Constipation, unspecified: Secondary | ICD-10-CM | POA: Diagnosis not present
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
## 2019-04-08 NOTE — Patient Instructions (Signed)
If you are age 83 or older, your body mass index should be between 23-30. Your Body mass index is 24.27 kg/m. If this is out of the aforementioned range listed, please consider follow up with your Primary Care Provider.  If you are age 106 or younger, your body mass index should be between 19-25. Your Body mass index is 24.27 kg/m. If this is out of the aformentioned range listed, please consider follow up with your Primary Care Provider.   To help prevent the possible spread of infection to our patients, communities, and staff; we will be implementing the following measures:  As of now we are not allowing any visitors/family members to accompany you to any upcoming appointments with [facility]. If you have any concerns about this please contact our office to discuss prior to the appointment.   Thank you for entrusting me with your care and for choosing [facility], Dr. Alberta Cellar

## 2019-04-14 ENCOUNTER — Telehealth: Payer: Self-pay | Admitting: Gastroenterology

## 2019-04-15 ENCOUNTER — Other Ambulatory Visit: Payer: Self-pay

## 2019-04-15 ENCOUNTER — Other Ambulatory Visit: Payer: Self-pay | Admitting: Family Medicine

## 2019-04-15 ENCOUNTER — Encounter: Payer: Self-pay | Admitting: Internal Medicine

## 2019-04-15 ENCOUNTER — Ambulatory Visit: Payer: Medicare HMO | Admitting: Internal Medicine

## 2019-04-15 DIAGNOSIS — J479 Bronchiectasis, uncomplicated: Secondary | ICD-10-CM

## 2019-04-15 DIAGNOSIS — R1012 Left upper quadrant pain: Secondary | ICD-10-CM

## 2019-04-15 DIAGNOSIS — N3941 Urge incontinence: Secondary | ICD-10-CM

## 2019-04-15 MED ORDER — AZITHROMYCIN 250 MG PO TABS: ORAL_TABLET | ORAL | 11 refills | Status: DC

## 2019-04-15 NOTE — Progress Notes (Signed)
Cheryl Martin, female    DOB: 03/19/1931,    MRN: [id number]   Brief patient profile:  72  yowf quit smoking 1975 moved from [place] with recurrent episodes of  bronchitis variable times every since arrival in [place] with some seasonal rhinitis esp in Spring and abnormal cxr since around 2017 with new dx of bronchiectasis by CT chest  01/12/2019 so referred to pulmonary clinic 03/02/2019 by Dr   Raliegh Scarlet     History of Present Illness  03/02/2019  Pulmonary/ 1st office eval/  Chief Complaint  Patient presents with  . Pulmonary Consult    Referred by Dr Raliegh Scarlet for bronchiectasis.    Dyspnea:  Can walk 25 min flat around a track each lap takes a min avg pace -faster most people her age Cough: just with flares / really not significant production chronically, just during flares of what she's been calling bronchitis which is only once a year at most.  Sleep: nasal congestion but doesn't keep her up, one pillow  SABA use: none New abd pain x 4 months no relation with meals  rec GERD diet  Bronchiectasis =    Whenever you develop cough congestion take mucinex or mucinex dm up to 1200 mg every 12 hours as needed        04/15/2019  f/u ov/ re: bronchiectasis  Chief Complaint  Patient presents with  . Follow-up    Breathing is doing well. She has occ throat clearing.   Dyspnea:  Walking track > no change in ex tol  Cough: none x with flares , last jan/feb 2020  Sleeping: some nasal congestion / doesn't really keep her up  SABA use: none  02: none    No obvious day to day or daytime variability or assoc excess/ purulent sputum or mucus plugs or hemoptysis or cp or chest tightness, subjective wheeze or overt sinus or hb symptoms.   Sleeping  without nocturnal  or early am exacerbation  of respiratory  c/o's or need for noct saba. Also denies any obvious fluctuation of symptoms with weather or environmental changes or other aggravating or alleviating factors except as outlined  above   No unusual exposure hx or h/o childhood pna/ asthma or knowledge of premature birth.  Current Allergies, Complete Past Medical History, Past Surgical History, Family History, and Social History were reviewed in [employer] record.  ROS  The following are not active complaints unless bolded Hoarseness, sore throat, dysphagia, dental problems, itching, sneezing,  nasal congestion or discharge of excess mucus or purulent secretions, ear ache,   fever, chills, sweats, unintended wt loss or wt gain, classically pleuritic or exertional cp,  orthopnea pnd or arm/hand swelling  or leg swelling, presyncope, palpitations, abdominal pain, anorexia, nausea, vomiting, diarrhea  or change in bowel habits or change in bladder habits, change in stools or change in urine, dysuria, hematuria,  rash, arthralgias, visual complaints, headache, numbness, weakness or ataxia or problems with walking or coordination,  change in mood or  memory.        Current Meds  Medication Sig  . acetaminophen (TYLENOL) 325 MG tablet Take 650 mg by mouth every 6 (six) hours as needed (for headaches).  Marland Kitchen aspirin EC 81 MG tablet Take 1 tablet (81 mg total) by mouth daily.  Marland Kitchen azithromycin (ZITHROMAX) 250 MG tablet Take 2 on day one then 1 daily x 4 days  . Biotin 10000 MCG TABS Take 10,000 mcg by mouth daily.   Marland Kitchen  Calcium-Magnesium (CAL/MAG CITRATE PO) Take 2 tablets by mouth daily after breakfast.  . Cholecalciferol (VITAMIN D-3 PO) Take 1 capsule by mouth daily with breakfast.  . Docusate Calcium (STOOL SOFTENER PO) Take 1 tablet by mouth every other day.  . folic acid (FOLVITE) 1 MG tablet Take 1 mg daily by mouth.  Marland Kitchen GARCINIA CAMBOGIA-CHROMIUM PO Take 2 tablets by mouth daily.   . nitroGLYCERIN (NITROSTAT) 0.4 MG SL tablet Place 1 tablet (0.4 mg total) under the tongue every 5 (five) minutes as needed for chest pain.  Marland Kitchen oxybutynin (DITROPAN-XL) 5 MG 24 hr tablet Take 1 tablet (5 mg total) by mouth at  bedtime.  . Polyethylene Glycol 3350 (MIRALAX PO) Take 17 g by mouth every other day.   . rosuvastatin (CRESTOR) 10 MG tablet Take 1 tablet (10 mg total) by mouth at bedtime.  . Sodium Chloride, Hypertonic, (MURO 128 OP) Place 1 drop into both eyes See admin instructions. Instill 1 drop into each eye in the morning, then as needed daily for dryness/irritation  . vitamin C (ASCORBIC ACID) 500 MG tablet Take 500 mg daily by mouth.                       Past Medical History:  Diagnosis Date  . ADD (attention deficit disorder)   . Allergy   . Anemia    as a teenager  . Arthritis   . Complication of anesthesia    slow to wake up after colonoscopy  . Edema   . Hearing loss   . History of hiatal hernia    hx of  . Hypothyroidism    hx of as child and during pregnancy  . Neuromuscular disorder ([place])   . Nocturia   . Pneumonia    hx of after bronchitis  . PONV (postoperative nausea and vomiting)   . Thyroid disease   Hands feet swelling     Objective:     Pleasant amb thin wf nad   Wt Readings from Last 3 Encounters:  04/15/19 138 lb (62.6 kg)  04/08/19 137 lb (62.1 kg)  03/15/19 136 lb (61.7 kg)     Vital signs reviewed - Note on arrival 02 sats  100% on RA         HEENT : pt wearing mask not removed for exam due to covid -19 concerns.    NECK :  without JVD/Nodes/TM/ nl carotid upstrokes bilaterally   LUNGS: no acc muscle use,  Nl contour chest which is clear to A and P bilaterally without cough on insp or exp maneuvers   CV:  RRR  no s3 or murmur or increase in P2, and no edema   ABD:  soft and nontender with nl inspiratory excursion in the supine position. No bruits or organomegaly appreciated, bowel sounds nl  MS:  Nl gait/ ext warm without deformities, calf tenderness, cyanosis or clubbing No obvious joint restrictions   SKIN: warm and dry without lesions    NEURO:  alert, approp, nl sensorium with  no motor or cerebellar deficits apparent.            Assessment

## 2019-04-15 NOTE — Telephone Encounter (Signed)
Patient scheduled for CT at [facility] CT on 04/27/19. Patient to arrive at 10:15am and have nothing solid 4 hours before. Patient to pick-up 1 bottle of contrast at our front desk before than and drink it 1 hour before.

## 2019-04-15 NOTE — Assessment & Plan Note (Signed)
Onset of ? around 2017  - see CT chest 01/12/2019 - Labs ordered 03/02/2019     alpha one AT phenotype  MM level 147 - Allergy profile 03/02/19 >  Eos 0.1  /  IgE  13 RAST neg  - Ig profile: 03/02/2019   Minimal decrease IgM and IgG - 04/15/2019 added prn cycles of zpak for flares    Adequate control on present rx, reviewed in detail with pt > no change in rx needed    Each maintenance medication was reviewed in detail including most importantly the difference between maintenance and as needed and under what circumstances the prns are to be used.  Please see AVS for specific  Instructions which are unique to this visit and I personally typed out  which were reviewed in detail in writing with the patient and a copy provided.     >>> f/u in 6 m, sooner if needed

## 2019-04-15 NOTE — Telephone Encounter (Signed)
Sorry to hear her symptoms persist, I had thought this was musculoskeletal pain when I saw her in the clinic. If it's worsening despite reducing her gardening we can offer her a CT scan of the abdomen with contrast, if she wants to proceed. Her last kidney function was normal. Thanks

## 2019-04-15 NOTE — Telephone Encounter (Signed)
Patient called and said she is still having the intermittent pain just below her left rib. She has taken a break from her gardening with all the bending over. The last 3 days it has been more frequent and more sever.

## 2019-04-15 NOTE — Patient Instructions (Addendum)
In the event of a flare of cough with excess or nasty mucus > refillable zpak   Please schedule a follow up visit in 6 months but call sooner if needed with pfts on return

## 2019-04-21 ENCOUNTER — Telehealth: Payer: Self-pay

## 2019-04-21 ENCOUNTER — Telehealth: Payer: Self-pay | Admitting: Gastroenterology

## 2019-04-21 NOTE — Telephone Encounter (Signed)
See staff message with authorization [id number] for CT

## 2019-04-21 NOTE — Telephone Encounter (Signed)
Es, this is a case you were working on.  Thanks!

## 2019-04-21 NOTE — Telephone Encounter (Signed)
I got a call from insurance about a peer to peer for the CT ordered for this patient. I did not realize this had gone peer to peer. Discussed case with the provider who approved it. Authorization number [id number] in case either of you need it. Thanks

## 2019-04-27 ENCOUNTER — Ambulatory Visit (INDEPENDENT_AMBULATORY_CARE_PROVIDER_SITE_OTHER)
Admission: RE | Admit: 2019-04-27 | Discharge: 2019-04-27 | Disposition: A | Discharge status: Home / Self Care | Payer: Medicare HMO | Source: Ambulatory Visit | Attending: Gastroenterology | Admitting: Gastroenterology

## 2019-04-27 ENCOUNTER — Telehealth: Payer: Self-pay | Admitting: Family Medicine

## 2019-04-27 ENCOUNTER — Other Ambulatory Visit: Payer: Self-pay

## 2019-04-27 DIAGNOSIS — M545 Low back pain, unspecified: Secondary | ICD-10-CM

## 2019-04-27 DIAGNOSIS — R1012 Left upper quadrant pain: Secondary | ICD-10-CM

## 2019-04-27 MED ORDER — IOHEXOL 300 MG/ML  SOLN
100.0000 mL | Freq: Once | INTRAMUSCULAR | Status: AC | PRN
  Administered 2019-04-27: 11:00:00 100 mL via INTRAVENOUS

## 2019-04-27 NOTE — Telephone Encounter (Signed)
Ortho group who has an interventional pain doc on staff or going to an interventional pain mgt practice would be next appropriate step  Thnx Larene Beach

## 2019-04-27 NOTE — Telephone Encounter (Signed)
Patient is complaining of pain under her rib.  This is a chronic issue.  She thinks it may be coming from her sciatic.  Patient has seen GI for this and imaging has been done.  Patient denies constipation at this time.  Patient would like to get to the bottom of it and would like referred to ortho.  Please advise.

## 2019-04-27 NOTE — Telephone Encounter (Signed)
Patient called, stating that she had a CT scan done today but is still in a lot of abdominal pain. She is needing someone to call her back about the abdominal pain & the CT scan. CT was ordered by GI, not by our office. Please Advise if we need to redirect her to Dr. Doyne Keel office.   -- fowarding to CMA --- am

## 2019-04-28 NOTE — Telephone Encounter (Signed)
Referral placed.

## 2019-04-30 ENCOUNTER — Encounter: Payer: Self-pay | Admitting: Family Medicine

## 2019-04-30 ENCOUNTER — Ambulatory Visit: Payer: Medicare HMO

## 2019-04-30 ENCOUNTER — Ambulatory Visit (INDEPENDENT_AMBULATORY_CARE_PROVIDER_SITE_OTHER): Payer: Medicare HMO | Admitting: Family Medicine

## 2019-04-30 ENCOUNTER — Other Ambulatory Visit: Payer: Self-pay

## 2019-04-30 DIAGNOSIS — M5442 Lumbago with sciatica, left side: Secondary | ICD-10-CM
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
## 2019-05-31 DIAGNOSIS — M545 Low back pain: Secondary | ICD-10-CM | POA: Diagnosis not present

## 2019-06-02 ENCOUNTER — Ambulatory Visit (INDEPENDENT_AMBULATORY_CARE_PROVIDER_SITE_OTHER): Payer: Medicare HMO | Admitting: Family Medicine

## 2019-06-02 ENCOUNTER — Encounter: Payer: Self-pay | Admitting: Family Medicine

## 2019-06-02 ENCOUNTER — Other Ambulatory Visit: Payer: Self-pay

## 2019-06-02 VITALS — BP 113/73 | HR 69 | Temp 97.8°F | Resp 10 | Ht 63.5 in | Wt 142.3 lb

## 2019-06-02 DIAGNOSIS — E7800 Pure hypercholesterolemia, unspecified: Secondary | ICD-10-CM

## 2019-06-02 DIAGNOSIS — I6529 Occlusion and stenosis of unspecified carotid artery: Secondary | ICD-10-CM

## 2019-06-02 DIAGNOSIS — E2839 Other primary ovarian failure: Secondary | ICD-10-CM | POA: Diagnosis not present

## 2019-06-02 DIAGNOSIS — G588 Other specified mononeuropathies: Secondary | ICD-10-CM | POA: Diagnosis not present

## 2019-06-02 DIAGNOSIS — E5111 Dry beriberi: Secondary | ICD-10-CM

## 2019-06-02 DIAGNOSIS — R911 Solitary pulmonary nodule: Secondary | ICD-10-CM | POA: Diagnosis not present

## 2019-06-02 DIAGNOSIS — J479 Bronchiectasis, uncomplicated: Secondary | ICD-10-CM | POA: Diagnosis not present

## 2019-06-02 DIAGNOSIS — E079 Disorder of thyroid, unspecified: Secondary | ICD-10-CM

## 2019-06-02 DIAGNOSIS — E559 Vitamin D deficiency, unspecified: Secondary | ICD-10-CM | POA: Diagnosis not present

## 2019-06-02 DIAGNOSIS — E538 Deficiency of other specified B group vitamins: Secondary | ICD-10-CM

## 2019-06-02 NOTE — Progress Notes (Signed)
Impression and Recommendations:    1. Bronchiectasis without complication (Cheryl Martin)   2. h/o Nodule of lower lobe of left lung- ( along w R upper and R middle lobes)    3. Pure hypercholesterolemia   4. Vitamin D deficiency   5. h/o low B12- on supp   6. Estrogen deficiency   7. thyroid disease- at time did not warrant txmnt   8. Carotid artery calcification, unspecified laterality   9. 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 - Lengthy conversation held with patient.  All questions were answered. - Discussed that as patient is higher risk with her age and underlying conditions, such as bronchiectasis, if she gets the flu, it could be deadly. - Handout provided today and directions given to patient to educate herself on CDC website. - Patient will call in and come for vaccine in future.   History of Pulmonary Nodule, Lower Lobe of L Lung - Last CT chest done 6 of 2020.  Pulmonary nodule was noted to be stable and benign with over 18 months of stability, and no further follow-up was required.   Bronchiectasis - Followed by Dr. Melvyn Novas of Pulmonology. - Patient knows to continue recommendations as established through [facility].   Pure Hypercholesterolemia - Managed on statin. - No changes made to treatment plan today. - Continue management as established.  See med list. - Need for re-check near future.   Carotid Artery Calcification - Orders for imaging placed today. Pt wonders why cards did not get imaging recently.   Vitamin D Deficiency - Continue management as established.  See med list. - No changes made to treatment plan today. - Need for re-check near future.   H/o Low B12 - On Supplementation - Continue management as established.  See med list. - no changes made to treatment plan today. - Need for re-check near future.   Lifestyle &  Preventative Health Maintenance - Advised patient to continue working toward exercising to improve overall mental, physical, and emotional health.    - Reviewed the "spokes of the wheel" of mood and health management.  Stressed the importance of ongoing prudent habits, including regular exercise, appropriate sleep hygiene, healthful dietary habits, and prayer/meditation to relax.  - Encouraged patient to engage in daily physical activity, especially a formal exercise routine.  Recommended that the patient eventually strive for at least 150 minutes of moderate cardiovascular activity per week according to guidelines [facility].   - Healthy dietary habits encouraged, including low-carb, and high amounts of lean protein in diet.   - Patient should also consume adequate amounts of water.  - Health counseling performed.  All questions answered.   Recommendations - Last full blood work obtained in 11 of 2019; due for full FBW.  - Advised patient to continue follow up with specialists as established. - Encouraged patient to obtain care for specialized concerns with associated specialists.   Orders Placed This Encounter  Procedures  . US Carotid Duplex Bilateral  . B12 and Folate Panel  . Vitamin B1  . TSH  . T4, free  . Vit D     Medications Discontinued During This Encounter  Medication Reason  . azithromycin (ZITHROMAX) 250 MG tablet Error     Gross side effects, risk and benefits, and alternatives of medications and treatment plan in general discussed with patient.  Patient is  aware that all medications have potential side effects and we are unable to predict every side effect or drug-drug interaction that may occur.   Patient will call with any questions prior to using medication if they have concerns.    Expresses verbal understanding and consents to current therapy and treatment regimen.  No barriers to understanding were identified.  Red flag symptoms and signs  discussed in detail.  Patient expressed understanding regarding what to do in case of emergency\urgent symptoms  Please see AVS handed out to patient at the end of our visit for further patient instructions/ counseling done pertaining to today's office visit.   Return for due for MCW 1 day after 07/17/2019, obtain FBW as well.     Note:  This note was prepared with assistance of Dragon voice recognition software. Occasional wrong-word or sound-a-like substitutions may have occurred due to the inherent limitations of voice recognition software.   This document serves as a record of services personally performed by Cheryl Dance, DO. It was created on her behalf by Cheryl Martin, a trained medical scribe. The creation of this record is based on the scribe's personal observations and the provider's statements to them.   This case required medical decision making of at least moderate complexity. The above documentation has been reviewed to be accurate and was completed by Cheryl Martin, D.O.      --------------------------------------------------------------------------------------------------------------------------------------------------------------------------------------------------------------------------------------------    Subjective:    Cheryl Martin, am serving as scribe for Dr. Mellody Martin.  HPI: Cheryl Martin is a 85 y.o. female who presents to [place] [facility] today for issues as discussed below.  Note doing fine with COVID.  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 Notes she was disappointed with the result of the election and admits to thinking about the result very often these days.  Discusses her feelings regarding the election at length and her role in assisting with voting.  Confirms feeling well and doing well overall.  Says "I've got a lot of things crazy going on, that thing under my rib;  they didn't find anything."  Says "It's bothering me but I've blanked it out of my mind."  Says "I can feel it, nobody else can.  It's okay, it's just there."  Has recently seen pulmonary, GI, and orthopedics.    - Orthopedics/Physical Therapy "Went yesterday and they did the needle treatment on my sciatica."  Says in the last week, her left leg has been going numb; "when I stand up, I have to make sure that my feet are under me."  Notes about a month ago, her left leg had fallen asleep, and she stood up and put her weight on it and her foot ended up in a weird position, which hurt.  Says the doctor (orthopedist) checked her foot and "didn't think there were broken bones and said it felt fine."  Patient mentioned this to her therapist yesterday and was told "if it doesn't go away soon, you should have an MRI."  - Bronchiectasis - Followed by Pulm Notes "did the test and nothing was wrong;" Dr. Melvyn Novas told her to keep azithromycin in the house to "prevent anything from going into pneumonia" as needed.  - Concerns about Flu Shot Says "three out of four times I've taken the flu shot, I've gotten sick.  I stopped taking it a long time ago, and I have not had the flu; I have had bronchitis."  Wt Readings from Last 3 Encounters:  06/02/19 142 lb 4.8 oz (64.5 kg)  04/15/19 138 lb (62.6 kg)  04/08/19 137 lb (62.1 kg)   BP Readings from Last 3 Encounters:  06/02/19 113/73  04/15/19 122/64  04/08/19 110/62   Pulse Readings from Last 3 Encounters:  06/02/19 69  04/15/19 (!) 55  04/08/19 70   BMI Readings from Last 3 Encounters:  06/02/19 24.81 kg/m  04/15/19 24.45 kg/m  04/08/19 24.27 kg/m     Patient Care Team    Relationship Specialty Notifications Start End  Cheryl Dance, DO PCP - General Family Medicine  02/20/16   Elsie Saas, MD Consulting Physician Orthopedic Surgery  04/19/15   Foye Spurling, MD Consulting Physician Endocrinology  02/21/16   Hurman Horn, MD  Consulting Physician Ophthalmology  02/21/16   Harriett Sine, MD Consulting Physician Dermatology  03/14/16   Angelia Mould, MD Consulting Physician Vascular Surgery  04/11/16   [name], MD Consulting Physician Orthopedic Surgery  [id number]   Jodi Marble, MD Consulting Physician Otolaryngology  09/03/17   Jolene Provost, PA-C Physician Assistant Physician Assistant  09/03/17    Comment: ENT  Armbruster, Carlota Raspberry, MD Consulting Physician Gastroenterology  04/17/18   Dorothy Spark, MD Consulting Physician Cardiology  01/05/19      Patient Active Problem List   Diagnosis Date Noted  . Urgency incontinence 05/24/2016    Priority: High  . Hyperlipidemia 02/24/2016    Priority: High  . Vitamin D deficiency 02/22/2016    Priority: High  . ADD (attention deficit disorder) 02/19/2011    Priority: High  . Vegan diet 06/08/2018    Priority: Medium  . Incidental lung nodule-7 mm right middle lobe subpleural nodule 06/03/2017    Priority: Medium  . h/o low B12- on supp 02/24/2016    Priority: Medium  . thyroid disease- at time did not warrant txmnt 02/19/2011    Priority: Medium  . Family history of lung cancer- brother 09/03/2017    Priority: Low  . Family history of stomach cancer and colon 09/03/2017    Priority: Low  . Chronic constipation 06/03/2017    Priority: Low  . Patient has healthcare proxy and living will 07/20/2016    Priority: Low  . OA (osteoarthritis) of knee 04/15/2016    Priority: Low  . Status post hysterectomy  02/21/2016    Priority: Low  . Bronchiectasis without complication ([place]) [id number]  . Multiple pulmonary nodules assoc with bronchiectasis 03/02/2019  . Hernia, hiatal 02/17/2019  . TIA (transient ischemic attack) 01/05/2019  . Environmental and seasonal allergies- aside from above sx 10/21/2018  . Exertional chest pain 10/21/2018  . SOB (shortness of breath) on exertion 10/21/2018  . Recurrent productive cough 08/31/2018  .  Malaise and fatigue 08/31/2018  . Nail abnormalities-  longitudinal nail ridges 06/08/2018  . 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 Hoarseness of voice 08/18/2017  . Urinary incontinence 05/23/2017  . Preoperative general physical examination 07/20/2016  . Chronic pain of left knee 04/11/2016  . Senile solar keratosis 03/14/2016  . Peripheral edema- bilateral lower extremities 03/14/2016  . ETD- Sx of L ear 03/14/2016  . Gen chronic Fatigue 02/24/2016  . Decreased hearing 02/19/2011    Past Medical history, Surgical history, Family history, Social history, Allergies and Medications have been entered into the medical record, reviewed and changed as needed.    Current Meds  Medication Sig  . acetaminophen (TYLENOL) 325 MG tablet Take 650 mg by mouth every 6 (six)  hours as needed (for headaches).  Marland Kitchen aspirin EC 81 MG tablet Take 1 tablet (81 mg total) by mouth daily.  . Biotin 10000 MCG TABS Take 10,000 mcg by mouth daily.   . Calcium-Magnesium (CAL/MAG CITRATE PO) Take 2 tablets by mouth daily after breakfast.  . Cholecalciferol (VITAMIN D-3 PO) Take 1 capsule by mouth daily with breakfast.  . Docusate Calcium (STOOL SOFTENER PO) Take 1 tablet by mouth every other day.  . folic acid (FOLVITE) 1 MG tablet Take 1 mg daily by mouth.  Marland Kitchen GARCINIA CAMBOGIA-CHROMIUM PO Take 2 tablets by mouth daily.   . nitroGLYCERIN (NITROSTAT) 0.4 MG SL tablet Place 1 tablet (0.4 mg total) under the tongue every 5 (five) minutes as needed for chest pain.  Marland Kitchen oxybutynin (DITROPAN-XL) 5 MG 24 hr tablet TAKE 1 TABLET BY MOUTH EVERYDAY AT BEDTIME  . Polyethylene Glycol 3350 (MIRALAX PO) Take 17 g by mouth every other day.   . rosuvastatin (CRESTOR) 10 MG tablet Take 1 tablet (10 mg total) by mouth at bedtime.  . Sodium Chloride, Hypertonic, (MURO 128 OP) Place 1 drop into both eyes See admin instructions. Instill 1 drop into each eye in the morning, then as needed daily for dryness/irritation  . vitamin C (ASCORBIC ACID)  500 MG tablet Take 500 mg daily by mouth.    Allergies:  Allergies  Allergen Reactions  . Flexeril [Cyclobenzaprine] Other (See Comments)    Caused excessive lethargy  . Mold Extract [Trichophyton] Other (See Comments)    Runny nose (with dust, also)  . Pollen Extract Other (See Comments)    Runny nose  . Tape Other (See Comments)    Skin will tear with medical tape, but can tolerate paper tape only     Review of Systems:  A fourteen system review of systems was performed and found to be positive as per HPI.   Objective:   Blood pressure 113/73, pulse 69, temperature 97.8 F (36.6 C), temperature source Oral, resp. rate 10, height 5' 3.5" (1.613 m), weight 142 lb 4.8 oz (64.5 kg), SpO2 97 %. Body mass index is 24.81 kg/m. General:  Well Developed, well nourished, appropriate for stated age.  Neuro:  Alert and oriented,  extra-ocular muscles intact  HEENT:  Normocephalic, atraumatic, neck supple, no carotid bruits appreciated  Skin:  no gross rash, warm, pink. Cardiac:  RRR, S1 S2 Respiratory:  ECTA B/L and A/P, Not using accessory muscles, speaking in full sentences- unlabored. Vascular:  Ext warm, no cyanosis apprec.; cap RF less 2 sec. Psych:  No HI/SI, judgement and insight good, Euthymic mood. Full Affect.

## 2019-06-02 NOTE — Patient Instructions (Signed)
Please make sure you discuss with your back specialist regarding the numbness in your leg that goes from your back all the way down to your foot.  This is something they would address with possible MRI or further imaging.  Thank you

## 2019-06-03 ENCOUNTER — Telehealth: Payer: Self-pay

## 2019-06-03 ENCOUNTER — Other Ambulatory Visit: Payer: Medicare HMO

## 2019-06-03 DIAGNOSIS — M545 Low back pain: Secondary | ICD-10-CM | POA: Diagnosis not present

## 2019-06-03 DIAGNOSIS — M79605 Pain in left leg: Secondary | ICD-10-CM

## 2019-06-03 NOTE — Telephone Encounter (Signed)
Patient Cheryl Martin on triage phone stating that she has been going to [facility] as prescribed by Dr Junius Roads. However, for the last 10 days she has been experiencing numbness in her left leg. She would like a call back to discuss next step in treatment plan [phone]

## 2019-06-04 ENCOUNTER — Other Ambulatory Visit: Payer: Medicare HMO

## 2019-06-04 ENCOUNTER — Other Ambulatory Visit: Payer: Self-pay

## 2019-06-04 DIAGNOSIS — E538 Deficiency of other specified B group vitamins: Secondary | ICD-10-CM | POA: Diagnosis not present

## 2019-06-04 DIAGNOSIS — E5111 Dry beriberi: Secondary | ICD-10-CM | POA: Diagnosis not present

## 2019-06-04 DIAGNOSIS — G588 Other specified mononeuropathies: Secondary | ICD-10-CM | POA: Diagnosis not present

## 2019-06-04 DIAGNOSIS — G459 Transient cerebral ischemic attack, unspecified: Secondary | ICD-10-CM

## 2019-06-04 DIAGNOSIS — E785 Hyperlipidemia, unspecified: Secondary | ICD-10-CM

## 2019-06-04 DIAGNOSIS — E079 Disorder of thyroid, unspecified: Secondary | ICD-10-CM | POA: Diagnosis not present

## 2019-06-04 DIAGNOSIS — R5381 Other malaise: Secondary | ICD-10-CM

## 2019-06-04 DIAGNOSIS — E7800 Pure hypercholesterolemia, unspecified: Secondary | ICD-10-CM

## 2019-06-04 DIAGNOSIS — E559 Vitamin D deficiency, unspecified: Secondary | ICD-10-CM

## 2019-06-04 NOTE — Telephone Encounter (Signed)
Pain is probably from pinched nerve in spine, but will order doppler to be sure no blood clot.  If doppler negative and pain persists, could refer for lumbar ESI.

## 2019-06-04 NOTE — Telephone Encounter (Signed)
I called the patient: She did not get to start PT until 11/09 because she was working at the [employer]. She is very pleased with Sport and exercise psychologist at [facility]. Her reason for calling is because she has some new symptoms over the past 2 weeks (before starting PT): intermittent numbness in the left leg, from the groin and down the anterior/lateral thigh to the knee. She says she does have pain in the groin at times. No falls, but is very careful when she stands to walk. No specific injuries bring on the symptoms. She asks if this could be a blood clot. Has no warmth, redness nor swelling in the leg. Please advise on the blood clot issue and if there is anything else she should/should not be doing while she continues the PT.

## 2019-06-04 NOTE — Telephone Encounter (Signed)
I called and advised the patient. She is unable to go for the doppler today, but can go on Monday afternoon prior to her PT visit. I told her our referral coordinator will be calling her with the appointment information.

## 2019-06-04 NOTE — Addendum Note (Signed)
Addended by: Mickel Crow on: 06/04/2019 09:56 AM   Modules accepted: Orders

## 2019-06-04 NOTE — Addendum Note (Signed)
Addended by: Hortencia Pilar on: 06/04/2019 11:44 AM   Modules accepted: Orders

## 2019-06-07 ENCOUNTER — Ambulatory Visit (HOSPITAL_COMMUNITY)
Admission: RE | Admit: 2019-06-07 | Discharge: 2019-06-07 | Disposition: A | Discharge status: Home / Self Care | Payer: Medicare HMO | Source: Ambulatory Visit | Attending: Family Medicine | Admitting: Family Medicine

## 2019-06-07 ENCOUNTER — Telehealth: Payer: Self-pay

## 2019-06-07 ENCOUNTER — Other Ambulatory Visit: Payer: Self-pay

## 2019-06-07 ENCOUNTER — Telehealth: Payer: Self-pay | Admitting: Family Medicine

## 2019-06-07 DIAGNOSIS — M79605 Pain in left leg: Secondary | ICD-10-CM | POA: Diagnosis not present

## 2019-06-07 DIAGNOSIS — M545 Low back pain: Secondary | ICD-10-CM | POA: Diagnosis not present

## 2019-06-07 NOTE — Telephone Encounter (Signed)
Doppler is negative for blood clot.   

## 2019-06-07 NOTE — Telephone Encounter (Signed)
See other message on this from today.

## 2019-06-07 NOTE — Telephone Encounter (Signed)
I called and left this information on the patient's mobile voice mail. Advised her to give [place] a call if she has any questions/concerns.

## 2019-06-07 NOTE — Telephone Encounter (Signed)
Received a  Call from [facility]. States patient is negative for DVT.  [phone]

## 2019-06-09 DIAGNOSIS — M545 Low back pain: Secondary | ICD-10-CM | POA: Diagnosis not present

## 2019-06-09 LAB — T4, FREE: Free T4: 1.29 ng/dL (ref 0.82–1.77)

## 2019-06-09 LAB — VITAMIN B1: Thiamine: 146.7 nmol/L (ref 66.5–200.0)

## 2019-06-09 LAB — VITAMIN D 25 HYDROXY (VIT D DEFICIENCY, FRACTURES): Vit D, 25-Hydroxy: 38.3 ng/mL (ref 30.0–100.0)

## 2019-06-09 LAB — B12 AND FOLATE PANEL
Folate: 15.6 ng/mL (ref >3.0)
Vitamin B-12: 460 pg/mL (ref 232–1245)

## 2019-06-09 LAB — TSH: TSH: 1.74 u[IU]/mL (ref 0.450–4.500)

## 2019-06-14 DIAGNOSIS — M545 Low back pain: Secondary | ICD-10-CM | POA: Diagnosis not present

## 2019-06-16 DIAGNOSIS — M545 Low back pain: Secondary | ICD-10-CM | POA: Diagnosis not present

## 2019-06-21 DIAGNOSIS — M545 Low back pain: Secondary | ICD-10-CM | POA: Diagnosis not present

## 2019-06-23 DIAGNOSIS — M545 Low back pain: Secondary | ICD-10-CM | POA: Diagnosis not present

## 2019-06-28 DIAGNOSIS — M545 Low back pain: Secondary | ICD-10-CM | POA: Diagnosis not present

## 2019-06-30 DIAGNOSIS — M545 Low back pain: Secondary | ICD-10-CM | POA: Diagnosis not present

## 2019-07-12 DIAGNOSIS — M545 Low back pain: Secondary | ICD-10-CM | POA: Diagnosis not present

## 2019-07-14 DIAGNOSIS — M545 Low back pain: Secondary | ICD-10-CM | POA: Diagnosis not present

## 2019-07-20 DIAGNOSIS — M545 Low back pain: Secondary | ICD-10-CM | POA: Diagnosis not present

## 2019-07-24 ENCOUNTER — Other Ambulatory Visit: Payer: Self-pay | Admitting: Family Medicine

## 2019-07-24 DIAGNOSIS — N3941 Urge incontinence: Secondary | ICD-10-CM

## 2019-08-05 ENCOUNTER — Ambulatory Visit
Admission: EM | Admit: 2019-08-05 | Discharge: 2019-08-05 | Disposition: A | Discharge status: Home / Self Care | Payer: Medicare HMO | Attending: Physician Assistant | Admitting: Physician Assistant

## 2019-08-05 DIAGNOSIS — M79672 Pain in left foot: Secondary | ICD-10-CM | POA: Diagnosis not present

## 2019-08-05 MED ORDER — DICLOFENAC SODIUM 1 % EX GEL: 2.0000 g | Freq: Four times a day (QID) | CUTANEOUS | 0 refills | Status: DC

## 2019-08-05 MED ORDER — IBUPROFEN 400 MG PO TABS: 400.0000 mg | ORAL_TABLET | Freq: Three times a day (TID) | ORAL | 0 refills | Status: DC | PRN

## 2019-08-05 NOTE — Discharge Instructions (Signed)
Ibuprofen as needed for pain. Voltaren gel. Ice compress, elevation, rest. Follow up with PCP if symptoms not improving in 1 week.

## 2019-08-05 NOTE — ED Triage Notes (Signed)
Pt states twisted her lt ankle 4-5 months ago and it heeled. Today around 330 after shopping states she developed pain to the sides and top of lt foot. Denies any new injury. States pain to bare weight, is wearing a ortho shoe for support.
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
## 2019-08-06 DIAGNOSIS — M79672 Pain in left foot: Secondary | ICD-10-CM | POA: Diagnosis not present

## 2019-08-06 DIAGNOSIS — M7742 Metatarsalgia, left foot: Secondary | ICD-10-CM | POA: Insufficient documentation

## 2019-08-06 DIAGNOSIS — M19072 Primary osteoarthritis, left ankle and foot: Secondary | ICD-10-CM | POA: Diagnosis not present

## 2019-08-09 ENCOUNTER — Ambulatory Visit: Payer: Medicare HMO | Admitting: Family Medicine

## 2019-08-18 ENCOUNTER — Ambulatory Visit: Payer: Medicare HMO | Admitting: Cardiology

## 2019-08-18 ENCOUNTER — Other Ambulatory Visit: Payer: Self-pay

## 2019-08-18 ENCOUNTER — Encounter: Payer: Self-pay | Admitting: Cardiology

## 2019-08-18 VITALS — BP 120/62 | HR 55 | Ht 63.5 in | Wt 140.2 lb

## 2019-08-18 DIAGNOSIS — E782 Mixed hyperlipidemia: Secondary | ICD-10-CM

## 2019-08-18 DIAGNOSIS — I2510 Atherosclerotic heart disease of native coronary artery without angina pectoris: Secondary | ICD-10-CM

## 2019-08-18 DIAGNOSIS — I2584 Coronary atherosclerosis due to calcified coronary lesion: Secondary | ICD-10-CM

## 2019-08-18 NOTE — Patient Instructions (Signed)
Medication Instructions:   Your physician recommends that you continue on your current medications as directed. Please refer to the Current Medication list given to you today.  *If you need a refill on your cardiac medications before your next appointment, please call your pharmacy*    Testing/Procedures:  Your physician has requested that you have a carotid duplex. This test is an ultrasound of the carotid arteries in your neck. It looks at blood flow through these arteries that supply the brain with blood. Allow one hour for this exam. There are no restrictions or special instructions.    Follow-Up: At [facility], you and your health needs are our priority.  As part of our continuing mission to provide you with exceptional heart care, we have created designated Provider Care Teams.  These Care Teams include your primary Cardiologist (physician) and Advanced Practice Providers (APPs -  Physician Assistants and Nurse Practitioners) who all work together to provide you with the care you need, when you need it.  Your next appointment:   12 month(s)  The format for your next appointment:   In Person  Provider:   Ena Dawley, MD
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
## 2019-08-26 DIAGNOSIS — M545 Low back pain: Secondary | ICD-10-CM | POA: Diagnosis not present

## 2019-08-26 DIAGNOSIS — Z96652 Presence of left artificial knee joint: Secondary | ICD-10-CM | POA: Diagnosis not present

## 2019-08-26 DIAGNOSIS — Z471 Aftercare following joint replacement surgery: Secondary | ICD-10-CM | POA: Diagnosis not present

## 2019-09-03 ENCOUNTER — Ambulatory Visit (HOSPITAL_COMMUNITY)
Admission: RE | Admit: 2019-09-03 | Payer: Medicare HMO | Source: Ambulatory Visit | Attending: Cardiology | Admitting: Cardiology

## 2019-09-03 ENCOUNTER — Other Ambulatory Visit: Payer: Self-pay

## 2019-09-03 ENCOUNTER — Other Ambulatory Visit: Payer: Self-pay | Admitting: Cardiology

## 2019-09-03 ENCOUNTER — Ambulatory Visit (HOSPITAL_COMMUNITY)
Admission: RE | Admit: 2019-09-03 | Discharge: 2019-09-03 | Disposition: A | Discharge status: Home / Self Care | Payer: Medicare HMO | Source: Ambulatory Visit | Attending: Cardiology | Admitting: Cardiology

## 2019-09-03 DIAGNOSIS — I6523 Occlusion and stenosis of bilateral carotid arteries: Secondary | ICD-10-CM

## 2019-09-03 DIAGNOSIS — M79672 Pain in left foot: Secondary | ICD-10-CM | POA: Diagnosis not present

## 2019-09-03 DIAGNOSIS — M19072 Primary osteoarthritis, left ankle and foot: Secondary | ICD-10-CM | POA: Diagnosis not present

## 2019-09-03 DIAGNOSIS — M7742 Metatarsalgia, left foot: Secondary | ICD-10-CM | POA: Diagnosis not present

## 2019-09-06 ENCOUNTER — Telehealth: Payer: Self-pay | Admitting: Family Medicine

## 2019-09-06 ENCOUNTER — Encounter (HOSPITAL_COMMUNITY): Payer: Medicare HMO

## 2019-09-06 NOTE — Telephone Encounter (Signed)
Called and advised patient CD was ready for pickup at front desk.

## 2019-09-06 NOTE — Telephone Encounter (Signed)
Patient is requesting her xrays on CD. She will sign a release form when she comes to pick up the CD. Please call when ready. [phone].

## 2019-09-16 ENCOUNTER — Encounter (HOSPITAL_COMMUNITY): Payer: Self-pay | Admitting: Emergency Medicine

## 2019-09-16 ENCOUNTER — Other Ambulatory Visit: Payer: Self-pay

## 2019-09-16 ENCOUNTER — Emergency Department (HOSPITAL_COMMUNITY)
Admission: EM | Admit: 2019-09-16 | Discharge: 2019-09-17 | Disposition: A | Discharge status: Home / Self Care | Payer: Medicare HMO | Attending: Emergency Medicine | Admitting: Emergency Medicine

## 2019-09-16 DIAGNOSIS — Z87891 Personal history of nicotine dependence: Secondary | ICD-10-CM | POA: Insufficient documentation

## 2019-09-16 DIAGNOSIS — K5900 Constipation, unspecified: Secondary | ICD-10-CM | POA: Diagnosis not present

## 2019-09-16 DIAGNOSIS — M419 Scoliosis, unspecified: Secondary | ICD-10-CM | POA: Insufficient documentation

## 2019-09-16 DIAGNOSIS — Z8673 Personal history of transient ischemic attack (TIA), and cerebral infarction without residual deficits: Secondary | ICD-10-CM | POA: Insufficient documentation

## 2019-09-16 DIAGNOSIS — Z7982 Long term (current) use of aspirin: Secondary | ICD-10-CM | POA: Insufficient documentation

## 2019-09-16 DIAGNOSIS — R0902 Hypoxemia: Secondary | ICD-10-CM | POA: Diagnosis not present

## 2019-09-16 DIAGNOSIS — M4310 Spondylolisthesis, site unspecified: Secondary | ICD-10-CM | POA: Diagnosis not present

## 2019-09-16 DIAGNOSIS — R55 Syncope and collapse: Secondary | ICD-10-CM | POA: Diagnosis not present

## 2019-09-16 DIAGNOSIS — M545 Low back pain: Secondary | ICD-10-CM | POA: Diagnosis not present

## 2019-09-16 DIAGNOSIS — M5136 Other intervertebral disc degeneration, lumbar region: Secondary | ICD-10-CM | POA: Diagnosis not present

## 2019-09-16 DIAGNOSIS — R1084 Generalized abdominal pain: Secondary | ICD-10-CM | POA: Diagnosis not present

## 2019-09-16 DIAGNOSIS — E039 Hypothyroidism, unspecified: Secondary | ICD-10-CM | POA: Diagnosis not present

## 2019-09-16 DIAGNOSIS — R42 Dizziness and giddiness: Secondary | ICD-10-CM | POA: Diagnosis not present

## 2019-09-16 DIAGNOSIS — R52 Pain, unspecified: Secondary | ICD-10-CM | POA: Diagnosis not present

## 2019-09-16 HISTORY — DX: Bronchitis, not specified as acute or chronic: J40

## 2019-09-16 LAB — CBC WITH DIFFERENTIAL/PLATELET
Abs Immature Granulocytes: 0.01 10*3/uL (ref 0.00–0.07)
Basophils Absolute: 0.1 10*3/uL (ref 0.0–0.1)
Basophils Relative: 1 %
Eosinophils Absolute: 0.2 10*3/uL (ref 0.0–0.5)
Eosinophils Relative: 2 %
HCT: 37.2 % (ref 36.0–46.0)
Hemoglobin: 12.3 g/dL (ref 12.0–15.0)
Immature Granulocytes: 0 %
Lymphocytes Relative: 15 %
Lymphs Abs: 0.9 10*3/uL (ref 0.7–4.0)
MCH: 31.1 pg (ref 26.0–34.0)
MCHC: 33.1 g/dL (ref 30.0–36.0)
MCV: 94.2 fL (ref 80.0–100.0)
Monocytes Absolute: 0.6 10*3/uL (ref 0.1–1.0)
Monocytes Relative: 9 %
Neutro Abs: 4.4 10*3/uL (ref 1.7–7.7)
Neutrophils Relative %: 73 %
Platelets: 167 10*3/uL (ref 150–400)
RBC: 3.95 MIL/uL (ref 3.87–5.11)
RDW: 12.2 % (ref 11.5–15.5)
WBC: 6.2 10*3/uL (ref 4.0–10.5)
nRBC: 0 % (ref 0.0–0.2)

## 2019-09-16 LAB — BASIC METABOLIC PANEL
Anion gap: 8 (ref 5–15)
BUN: 17 mg/dL (ref 8–23)
CO2: 24 mmol/L (ref 22–32)
Calcium: 9.4 mg/dL (ref 8.9–10.3)
Chloride: 107 mmol/L (ref 98–111)
Creatinine, Ser: 0.92 mg/dL (ref 0.44–1.00)
GFR calc Af Amer: >60 mL/min (ref >60)
GFR calc non Af Amer: 56 mL/min — ABNORMAL LOW (ref >60)
Glucose, Bld: 152 mg/dL — ABNORMAL HIGH (ref 70–99)
Potassium: 3.9 mmol/L (ref 3.5–5.1)
Sodium: 139 mmol/L (ref 135–145)

## 2019-09-16 LAB — URINALYSIS, ROUTINE W REFLEX MICROSCOPIC
Bilirubin Urine: NEGATIVE
Glucose, UA: NEGATIVE mg/dL
Hgb urine dipstick: NEGATIVE
Ketones, ur: NEGATIVE mg/dL
Leukocytes,Ua: NEGATIVE
Nitrite: NEGATIVE
Protein, ur: NEGATIVE mg/dL
Specific Gravity, Urine: 1.01 (ref 1.005–1.030)
pH: 6 (ref 5.0–8.0)

## 2019-09-16 MED ORDER — SODIUM CHLORIDE 0.9 % IV BOLUS
500.0000 mL | Freq: Once | INTRAVENOUS | Status: AC
  Administered 2019-09-16: 500 mL via INTRAVENOUS

## 2019-09-16 NOTE — ED Notes (Signed)
Linna Hoff daughter [phone] looking for an update on the pt

## 2019-09-16 NOTE — ED Triage Notes (Signed)
Pt presents from home for near syncope. Was using the bathroom, began to experience abd pain (strained), clamminess, dizziness; sat with her head between her legs and then was able to ambulate to bed where she called EMS. EMS arrived to pt warm and dry, A&Ox4, neuro clear, BP 82/48. 250cc NS provided, BP increased to 110/50. 500c total given, lung sounds clear after. Endorses one previous similar event but was never treated.   Denies syncope, injury, CP, SOB  165CBG, 97.79F, 18RR, 99%RA  H/o chronic back pain

## 2019-09-16 NOTE — ED Notes (Signed)
Pt is alert and oriented x 4. Shew denies pain and states that she feels well.

## 2019-09-17 NOTE — Discharge Instructions (Addendum)
We suspect that your near fainting episode was likely a vasovagal attack, which is a benign response from your body that is unpleasant and feels like you are going to faint.  We recommend that he follow-up with your PCP for it.  Ensure that you are hydrating yourself well and being careful when you get up or walk.  If you have an episode of fainting please return to the ER.

## 2019-09-18 ENCOUNTER — Other Ambulatory Visit (HOSPITAL_COMMUNITY)
Admission: RE | Admit: 2019-09-18 | Discharge: 2019-09-18 | Disposition: A | Discharge status: Home / Self Care | Payer: Medicare HMO | Source: Ambulatory Visit | Attending: Internal Medicine | Admitting: Internal Medicine

## 2019-09-18 DIAGNOSIS — Z20822 Contact with and (suspected) exposure to covid-19: Secondary | ICD-10-CM | POA: Diagnosis not present

## 2019-09-18 DIAGNOSIS — Z01812 Encounter for preprocedural laboratory examination: Secondary | ICD-10-CM | POA: Insufficient documentation

## 2019-09-18 LAB — SARS CORONAVIRUS 2 (TAT 6-24 HRS): SARS Coronavirus 2: NEGATIVE

## 2019-09-21 ENCOUNTER — Ambulatory Visit: Payer: Medicare HMO

## 2019-09-21 ENCOUNTER — Ambulatory Visit: Payer: Medicare HMO | Admitting: Internal Medicine

## 2019-09-21 ENCOUNTER — Other Ambulatory Visit: Payer: Self-pay

## 2019-09-21 ENCOUNTER — Encounter: Payer: Self-pay | Admitting: Internal Medicine

## 2019-09-21 DIAGNOSIS — J479 Bronchiectasis, uncomplicated: Secondary | ICD-10-CM

## 2019-09-21 LAB — PULMONARY FUNCTION TEST
FEF 25-75 Post: 1.67 L/sec
FEF 25-75 Pre: 1.36 L/sec
FEF2575-%Change-Post: 22 %
FEF2575-%Pred-Post: 152 %
FEF2575-%Pred-Pre: 124 %
FEV1-%Change-Post: 4 %
FEV1-%Pred-Post: 128 %
FEV1-%Pred-Pre: 123 %
FEV1-Post: 2.22 L
FEV1-Pre: 2.13 L
FEV1FVC-%Change-Post: 5 %
FEV1FVC-%Pred-Pre: 98 %
FEV6-%Change-Post: 0 %
FEV6-%Pred-Post: 132 %
FEV6-%Pred-Pre: 132 %
FEV6-Post: 2.91 L
FEV6-Pre: 2.91 L
FEV6FVC-%Change-Post: 0 %
FEV6FVC-%Pred-Post: 104 %
FEV6FVC-%Pred-Pre: 104 %
FVC-%Change-Post: 0 %
FVC-%Pred-Post: 126 %
FVC-%Pred-Pre: 127 %
FVC-Post: 2.96 L
FVC-Pre: 2.98 L
Post FEV1/FVC ratio: 75 %
Post FEV6/FVC ratio: 98 %
Pre FEV1/FVC ratio: 71 %
Pre FEV6/FVC Ratio: 98 %

## 2019-09-21 NOTE — Progress Notes (Signed)
Cheryl Martin, female    DOB: 08-29-1930,    MRN: [id number]   Brief patient profile:  64  yowf quit smoking 1975 moved from [place] 1984 with recurrent episodes of  bronchitis variable times every since arrival in [place] with some seasonal rhinitis esp in Spring and abnormal cxr since around 2017 with new dx of bronchiectasis by CT chest  01/12/2019 so referred to pulmonary clinic 03/02/2019 by Dr   Raliegh Scarlet     History of Present Illness  03/02/2019  Pulmonary/ 1st office eval/Lester Crickenberger  Chief Complaint  Patient presents with  . Pulmonary Consult    Referred by Dr Raliegh Scarlet for bronchiectasis.    Dyspnea:  Can walk 25 min flat around a track each lap takes a min avg pace -faster most people her age Cough: just with flares / really not significant production chronically, just during flares of what she's been calling bronchitis which is only once a year at most.  Sleep: nasal congestion but doesn't keep her up, one pillow  SABA use: none New abd pain x 4 months no relation with meals  rec GERD diet  Bronchiectasis =    Whenever you develop cough congestion take mucinex or mucinex dm up to 1200 mg every 12 hours as needed        04/15/2019  f/u ov/Elhadji Pecore re: bronchiectasis  Chief Complaint  Patient presents with  . Follow-up    Breathing is doing well. She has occ throat clearing.   Dyspnea:  Walking track > no change in ex tol  Cough: none x with flares , last jan/feb 2020  Sleeping: some nasal congestion / doesn't really keep her up  SABA use: none  02: none  rec In the event of a flare of cough with excess or nasty mucus > refillable zpak Please schedule a follow up visit in 6 months but call sooner if needed with pfts on return    09/21/2019  f/u ov/Lennyn Bellanca re:  Chief Complaint  Patient presents with  . Follow-up    Arlyce Harman before and after done today. Breathing is about the same since the last visit. No new co's.    Dyspnea:  Not limited by breathing from desired activities   Cough: occ  flares and immediately better with zmax Sleeping: no resp symptoms SABA use: none  02: none    No obvious day to day or daytime variability or assoc excess/ purulent sputum or mucus plugs or hemoptysis or cp or chest tightness, subjective wheeze or overt sinus or hb symptoms.   Sleeping  without nocturnal  or early am exacerbation  of respiratory  c/o's or need for noct saba. Also denies any obvious fluctuation of symptoms with weather or environmental changes or other aggravating or alleviating factors except as outlined above   No unusual exposure hx or h/o childhood pna/ asthma or knowledge of premature birth.  Current Allergies, Complete Past Medical History, Past Surgical History, Family History, and Social History were reviewed in [employer] record.  ROS  The following are not active complaints unless bolded Hoarseness, sore throat, dysphagia, dental problems, itching, sneezing,  nasal congestion or discharge of excess mucus or purulent secretions, ear ache,   fever, chills, sweats, unintended wt loss or wt gain, classically pleuritic or exertional cp,  orthopnea pnd or arm/hand swelling  or leg swelling, presyncope, palpitations, abdominal pain, anorexia, nausea, vomiting, diarrhea  or change in bowel habits or change in bladder habits, change in stools or change  in urine, dysuria, hematuria,  rash, arthralgias, visual complaints, headache, numbness, weakness or ataxia or problems with walking or coordination,  change in mood or  memory.        Current Meds  Medication Sig  . acetaminophen (TYLENOL) 325 MG tablet Take 650 mg by mouth every 6 (six) hours as needed (for headaches).  Marland Kitchen aspirin EC 81 MG tablet Take 1 tablet (81 mg total) by mouth daily. (Patient taking differently: Take 81 mg by mouth at bedtime. )  . Biotin 10000 MCG TABS Take 10,000 mcg by mouth daily.   . Calcium-Magnesium (CAL/MAG CITRATE PO) Take 2 tablets by mouth daily after breakfast.  .  Cholecalciferol (VITAMIN D-3 PO) Take 1 capsule by mouth daily with breakfast.  . Cyanocobalamin (VITAMIN B-12 PO) Take 1 tablet by mouth daily.  . diclofenac Sodium (VOLTAREN) 1 % GEL Apply 2 g topically 4 (four) times daily. (Patient taking differently: Apply 2 g topically 4 (four) times daily as needed (to painful sites). )  . Docusate Calcium (STOOL SOFTENER PO) Take 1 tablet by mouth every other day.  . folic acid (FOLVITE) 1 MG tablet Take 1 mg daily by mouth.  Marland Kitchen GARCINIA CAMBOGIA-CHROMIUM PO Take 3 tablets by mouth daily.   Marland Kitchen ibuprofen (ADVIL) 400 MG tablet Take 1 tablet (400 mg total) by mouth every 8 (eight) hours as needed. (Patient taking differently: Take 400 mg by mouth every 8 (eight) hours as needed (for pain). )  . nitroGLYCERIN (NITROSTAT) 0.4 MG SL tablet Place 1 tablet (0.4 mg total) under the tongue every 5 (five) minutes as needed for chest pain.  Marland Kitchen oxybutynin (DITROPAN-XL) 5 MG 24 hr tablet TAKE 1 TABLET BY MOUTH EVERYDAY AT BEDTIME (Patient taking differently: Take 5 mg by mouth at bedtime. )  . Polyethylene Glycol 3350 (MIRALAX PO) Take 17 g by mouth every other day.   . rosuvastatin (CRESTOR) 10 MG tablet Take 1 tablet (10 mg total) by mouth at bedtime.  . Sodium Chloride, Hypertonic, (MURO 128 OP) Place 1 drop into both eyes See admin instructions. Instill 1 drop into each eye in the morning, then as needed daily for dryness/irritation  . TURMERIC PO Take 1 capsule by mouth daily after breakfast.  . vitamin C (ASCORBIC ACID) 500 MG tablet Take 500 mg daily by mouth.                     Past Medical History:  Diagnosis Date  . ADD (attention deficit disorder)   . Allergy   . Anemia    as a teenager  . Arthritis   . Complication of anesthesia    slow to wake up after colonoscopy  . Edema   . Hearing loss   . History of hiatal hernia    hx of  . Hypothyroidism    hx of as child and during pregnancy  . Neuromuscular disorder ([place])   . Nocturia   . Pneumonia     hx of after bronchitis  . PONV (postoperative nausea and vomiting)   . Thyroid disease   Hands feet swelling     Objective:      pleasant amb wf nad    Vital signs reviewed  09/21/2019  - Note at rest 02 sats  100% on RA     09/21/2019        140  04/15/19 138 lb (62.6 kg)  04/08/19 137 lb (62.1 kg)  03/15/19 136 lb (61.7 kg)  HEENT : pt wearing mask not removed for exam due to covid -19 concerns.    NECK :  without JVD/Nodes/TM/ nl carotid upstrokes bilaterally   LUNGS: no acc muscle use,  Nl contour chest which is clear to A and P bilaterally without cough on insp or exp maneuvers   CV:  RRR  no s3 or murmur or increase in P2, and no edema   ABD:  soft and nontender with nl inspiratory excursion in the supine position. No bruits or organomegaly appreciated, bowel sounds nl  MS:  Nl gait/ ext warm without deformities, calf tenderness, cyanosis or clubbing No obvious joint restrictions   SKIN: warm and dry without lesions    NEURO:  alert, approp, nl sensorium with  no motor or cerebellar deficits apparent.           Assessment

## 2019-09-21 NOTE — Patient Instructions (Signed)
No change in recommendations  Your overall lung function is excellent   Please schedule a follow up visit in 6  months but call sooner if needed

## 2019-09-21 NOTE — Assessment & Plan Note (Signed)
Onset of ? around 2017  - see CT chest 01/12/2019 - Labs ordered 03/02/2019     alpha one AT phenotype  MM level 147 - Allergy profile 03/02/19 >  Eos 0.1  /  IgE  13 RAST neg  - Ig profile: 03/02/2019   Minimal decrease IgM and IgG - 04/15/2019 added prn cycles of zpak for flares  - PFT's  09/21/2019  FEV1 2.22 (128 % ) ratio 0.75  p 4 % improvement from saba p nothing prior to study  No clinical indication of dz progression just using zmax for flares s need for bronchodilators so no change in rx  F/u can be q 6 m, sooner prn         Each maintenance medication was reviewed in detail including emphasizing most importantly the difference between maintenance and prns and under what circumstances the prns are to be triggered using an action plan format where appropriate.  Total time for H and P, chart review, counseling,   and generating customized AVS unique to this office visit / charting = 20 min
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
## 2019-10-12 ENCOUNTER — Telehealth: Payer: Self-pay | Admitting: Family Medicine

## 2019-10-12 DIAGNOSIS — N3941 Urge incontinence: Secondary | ICD-10-CM

## 2019-10-12 NOTE — Telephone Encounter (Signed)
This medicine is typically generic hence, she will have to call her insurance and see what they consider the cheapest option for her, as I do not know what the best option would be for her insurance.    Let patient know that Detrol, Vesicare and Ditropan are all generic hence, I would not know which one is best

## 2019-10-12 NOTE — Telephone Encounter (Signed)
Pt called states she needs a refill on Oxibutrin but can not Afford the cost & ask if Provider can find an Alternative  To:   oxybutynin (DITROPAN-XL) 5 MG 24 hr tablet [id number]   Order Details Dose, Route, Frequency: As Directed  Dispense Quantity: 90 tablet Refills: 0       Sig: TAKE 1 TABLET BY MOUTH EVERYDAY AT BEDTIME  Patient taking differently: Take 5 mg by mouth at bedtime.    --forwarding request to med asst for review/ research -- Please contact pt if questions @ [phone].  -glh

## 2019-10-12 NOTE — Telephone Encounter (Signed)
Please advise.  T. Vail Vuncannon, CMA 

## 2019-10-12 NOTE — Telephone Encounter (Signed)
If patient prefers she can try over-the-counter Oxytrol-which is oxybutynin transdermal.  This is the same medicine as the oxybutynin we prescribed her but may be cheaper for her.

## 2019-10-13 NOTE — Telephone Encounter (Signed)
Patient is agreeable to referral. Referral placed using DX  urgency incontinence per Dr. Raliegh Scarlet. AS, CMA

## 2019-10-13 NOTE — Telephone Encounter (Signed)
If patient agrees to this please just place order, no need to send message back to me

## 2019-10-13 NOTE — Telephone Encounter (Signed)
Patient states that it is not the cost that's the issues, she states it is causing excessive Thurst and dry mouth which then causes frequent urination at night.Per Dr. Raliegh Scarlet all these kinds of medications cause these issues. Per Dr. Raliegh Scarlet patient should stop drinking after 5 pm so that she doesn't have frequent urination at night from this medication. Patient asking if there is a surgery that can fix this problem bc she doesn't like the medication. Please advise. AS, CMA

## 2019-10-13 NOTE — Telephone Encounter (Signed)
Let patient know we will have to send her to urology so she can consult with them about what options she may have to help her symptoms besides medications.

## 2019-10-18 ENCOUNTER — Other Ambulatory Visit: Payer: Self-pay | Admitting: Family Medicine

## 2019-10-18 DIAGNOSIS — N3941 Urge incontinence: Secondary | ICD-10-CM

## 2019-10-19 ENCOUNTER — Other Ambulatory Visit: Payer: Self-pay | Admitting: Cardiology

## 2019-10-19 DIAGNOSIS — R0789 Other chest pain: Secondary | ICD-10-CM

## 2019-10-19 DIAGNOSIS — R072 Precordial pain: Secondary | ICD-10-CM

## 2019-10-19 DIAGNOSIS — I2510 Atherosclerotic heart disease of native coronary artery without angina pectoris: Secondary | ICD-10-CM

## 2019-10-19 DIAGNOSIS — E782 Mixed hyperlipidemia: Secondary | ICD-10-CM

## 2019-10-26 ENCOUNTER — Ambulatory Visit: Payer: Medicare HMO | Admitting: Cardiology
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
## 2019-11-18 ENCOUNTER — Ambulatory Visit: Payer: Medicare HMO | Admitting: Urology

## 2019-11-18 ENCOUNTER — Other Ambulatory Visit: Payer: Self-pay | Admitting: Family Medicine

## 2019-11-18 ENCOUNTER — Encounter: Payer: Self-pay | Admitting: Urology

## 2019-11-18 ENCOUNTER — Other Ambulatory Visit: Payer: Self-pay

## 2019-11-18 VITALS — BP 121/72 | HR 59 | Ht 63.0 in | Wt 137.0 lb

## 2019-11-18 DIAGNOSIS — N3941 Urge incontinence: Secondary | ICD-10-CM

## 2019-11-18 LAB — BLADDER SCAN AMB NON-IMAGING

## 2019-11-18 MED ORDER — MIRABEGRON ER 50 MG PO TB24: 50.0000 mg | ORAL_TABLET | Freq: Every day | ORAL | 0 refills | Status: DC

## 2019-11-18 NOTE — Patient Instructions (Signed)

## 2019-11-22 LAB — URINALYSIS, COMPLETE
Bilirubin, UA: NEGATIVE
Glucose, UA: NEGATIVE
Ketones, UA: NEGATIVE
Leukocytes,UA: NEGATIVE
Nitrite, UA: NEGATIVE
Protein,UA: NEGATIVE
Specific Gravity, UA: 1.025 (ref 1.005–1.030)
Urobilinogen, Ur: 0.2 mg/dL (ref 0.2–1.0)
pH, UA: 7 (ref 5.0–7.5)

## 2019-11-22 LAB — MICROSCOPIC EXAMINATION: Bacteria, UA: NONE SEEN

## 2019-12-07 ENCOUNTER — Other Ambulatory Visit: Payer: Medicare HMO

## 2019-12-07 ENCOUNTER — Ambulatory Visit
Admission: RE | Admit: 2019-12-07 | Discharge: 2019-12-07 | Disposition: A | Discharge status: Home / Self Care | Payer: Medicare HMO | Source: Ambulatory Visit | Attending: Urology | Admitting: Urology

## 2019-12-07 ENCOUNTER — Other Ambulatory Visit: Payer: Self-pay

## 2019-12-07 DIAGNOSIS — N3941 Urge incontinence: Secondary | ICD-10-CM

## 2019-12-07 DIAGNOSIS — R3129 Other microscopic hematuria: Secondary | ICD-10-CM | POA: Diagnosis not present

## 2019-12-07 DIAGNOSIS — K838 Other specified diseases of biliary tract: Secondary | ICD-10-CM | POA: Diagnosis not present

## 2019-12-07 MED ORDER — IOPAMIDOL (ISOVUE-300) INJECTION 61%
100.0000 mL | Freq: Once | INTRAVENOUS | Status: AC | PRN
  Administered 2019-12-07: 125 mL via INTRAVENOUS

## 2019-12-08 ENCOUNTER — Telehealth: Payer: Self-pay | Admitting: *Deleted

## 2019-12-08 ENCOUNTER — Telehealth: Payer: Self-pay

## 2019-12-08 ENCOUNTER — Other Ambulatory Visit: Payer: Self-pay | Admitting: Urology

## 2019-12-08 DIAGNOSIS — R918 Other nonspecific abnormal finding of lung field: Secondary | ICD-10-CM

## 2019-12-08 NOTE — Telephone Encounter (Addendum)
  Left Vm to return my call  ----- Message from [name], MD sent at 12/08/2019  9:09 AM EDT ----- CT scan looks fine from a urology perspective but there was some incidental lung nodules.  The radiologist is recommending a dedicated chest CT.  I would like to go ahead and order this and have it done prior to your follow-up.  We can make the appropriate referral as needed based on the results.  [name], MD

## 2019-12-08 NOTE — Telephone Encounter (Signed)
Patient would like to schedule PTNS treatments. Please check PA and contact patient to schedule

## 2019-12-08 NOTE — Telephone Encounter (Signed)
Pt left a message returning your call 

## 2019-12-08 NOTE — Telephone Encounter (Signed)
-----   Message from [name], MD sent at 12/08/2019  9:09 AM EDT ----- CT scan looks fine from a urology perspective but there was some incidental lung nodules.  The radiologist is recommending a dedicated chest CT.  I would like to go ahead and order this and have it done prior to your follow-up.  We can make the appropriate referral as needed based on the results.  [name], MD

## 2019-12-08 NOTE — Telephone Encounter (Signed)
Patient notified she states that she has a pulmonologist Dr. Melvyn Novas, as a Cheryl Martin

## 2019-12-08 NOTE — Telephone Encounter (Signed)
PTNS No Auth needed.

## 2019-12-17 ENCOUNTER — Ambulatory Visit (HOSPITAL_COMMUNITY)
Admission: RE | Admit: 2019-12-17 | Discharge: 2019-12-17 | Disposition: A | Discharge status: Home / Self Care | Payer: Medicare HMO | Source: Ambulatory Visit | Attending: Urology | Admitting: Urology

## 2019-12-17 ENCOUNTER — Other Ambulatory Visit: Payer: Self-pay

## 2019-12-17 DIAGNOSIS — R918 Other nonspecific abnormal finding of lung field: Secondary | ICD-10-CM | POA: Insufficient documentation

## 2019-12-20 NOTE — Progress Notes (Signed)
12/21/19   CC:  Chief Complaint  Patient presents with   Cysto    HPI: Cheryl Martin is a 86 y.o. F w/ hx of microscopic hematuria returns today for a cystoscopy.   Her UA from 11/18/19 reveled 3-10 RBC.   CT A/P w wo contrast from 12/07/19 revealed no findings to explain the patient's history of hematuria. 9 mm subpleural right middle lobe pulmonary nodule has increased in size and there is a new 8 mm right middle lobe pulmonary nodule. Dedicated CT chest without contrast recommended.  F/u CT chest w/o contrast revealed stable benign small nodules, biapical pleuroparenchymal scarring, and post infectious or inflammatory scarring, unchanged compared to prior examination and definitively benign. No evidence of ongoing atypical infection. No further routine CT follow-up is required.  She was on Myrbetriq for her bladder overactivity however called in stating interest in PTNS.  Myrbetriq is prohibitively expensive for her.  Today, she reports concerns w/ insurance coverage for PTNS.   Today's Vitals   12/21/19 1115  BP: 136/70  Pulse: 70   There is no height or weight on file to calculate BMI. NED. A&Ox3.   No respiratory distress   Abd soft, NT, ND Normal external genitalia with patent urethral meatus  Cystoscopy Procedure Note  Patient identification was confirmed, informed consent was obtained, and patient was prepped using Betadine solution.  Lidocaine jelly was administered per urethral meatus.    Procedure: - Flexible cystoscope introduced, without any difficulty.   - Thorough search of the bladder revealed:    normal urethral meatus    normal urothelium    no stones    no ulcers     no tumors    no urethral polyps    no trabeculation  - Ureteral orifices were normal in position and appearance.  Post-Procedure: - Patient tolerated the procedure well  Laboratory findings:  3-10 RBC, otherwise negative  Pertinent Imagings:  CLINICAL DATA:  Microscopic  hematuria. Prior cholecystectomy, appendectomy, and hysterectomy.  EXAM: CT ABDOMEN AND PELVIS WITHOUT AND WITH CONTRAST  TECHNIQUE: Multidetector CT imaging of the abdomen and pelvis was performed following the standard protocol before and following the bolus administration of intravenous contrast.  CONTRAST:  133mL ISOVUE-300 IOPAMIDOL (ISOVUE-300) INJECTION 61%  COMPARISON:  Abdomen CT 04/27/2019.  FINDINGS: Lower chest: 9 mm subpleural right middle lobe nodule has increased in size from 7 mm on the prior study. There is a new 8 mm right middle lobe nodule on 19/9. Moderate to large hiatal hernia.  Hepatobiliary: No suspicious focal abnormality within the liver parenchyma. Gallbladder is surgically absent. Intra hepatic biliary duct dilatation similar to 04/27/2019. Common duct is distended in the hepatoduodenal ligament with apparent cut off of the duct in the head of pancreas. Gallbladder surgically absent.  Pancreas: No focal mass lesion. No dilatation of the main duct. No intraparenchymal cyst. No peripancreatic edema.  Spleen: No splenomegaly. No focal mass lesion.  Adrenals/Urinary Tract: No adrenal nodule or mass. No stones are seen in either kidney or ureter. No bladder stones.  Imaging after IV contrast administration shows no suspicious enhancing lesion in either kidney.  Delayed post-contrast imaging shows no wall thickening or soft tissue filling defect in either intrarenal collecting system or renal pelvis. Mild fullness noted right intrarenal collecting system and renal pelvis. Neither ureter completely opacified, but no evidence for focal hydroureter or mass lesion along the course of either ureter. Delayed imaging of the bladder shows a small right bladder diverticulum. No focal  bladder wall abnormality evident.  Stomach/Bowel: Moderate to large hiatal hernia. Stomach otherwise unremarkable. Duodenum is normally positioned as is the  ligament of [place]. No small bowel wall thickening. No small bowel dilatation. The terminal ileum is normal. No gross colonic mass. No colonic wall thickening.  Vascular/Lymphatic: There is abdominal aortic atherosclerosis without aneurysm. There is no gastrohepatic or hepatoduodenal ligament lymphadenopathy. No retroperitoneal or mesenteric lymphadenopathy. No pelvic sidewall lymphadenopathy.  Reproductive: The uterus is surgically absent. There is no adnexal mass.  Other: No intraperitoneal free fluid.  Musculoskeletal: Pelvic floor laxity evident. No worrisome lytic or sclerotic osseous abnormality. Thoracolumbar scoliosis evident.  IMPRESSION: 1. No CT findings to explain the patient's history of hematuria. 2. Intra hepatic biliary duct dilatation, similar to 04/27/2019. Common duct is distended in the hepatoduodenal ligament with apparent cut off of the duct in the head of pancreas. Correlation with liver function test recommended. MRCP recommended to further evaluate as clinically warranted. 3. 9 mm subpleural right middle lobe pulmonary nodule has increased in size and there is a new 8 mm right middle lobe pulmonary nodule. Dedicated CT chest without contrast recommended. 4. Moderate to large hiatal hernia. 5. Pelvic floor laxity. 6. Aortic Atherosclerosis (ICD10-I70.0).   Electronically Signed   By: Misty Stanley M.D.   On: 12/07/2019 16:33 CLINICAL DATA:  Follow-up lung nodules  EXAM: CT CHEST WITHOUT CONTRAST  TECHNIQUE: Multidetector CT imaging of the chest was performed following the standard protocol without IV contrast.  COMPARISON:  01/12/2019  FINDINGS: Cardiovascular: Aortic atherosclerosis. Normal heart size. Three-vessel coronary artery calcifications and/or stents. No pericardial effusion.  Mediastinum/Nodes: No enlarged mediastinal, hilar, or axillary lymph nodes. Small hiatal hernia. Thyroid gland, trachea, and  esophagus demonstrate no significant findings.  Lungs/Pleura: Right greater than left biapical pleuroparenchymal scarring. Mild, left greater than right bibasilar scarring. Tiny clustered centrilobular and tree-in-bud nodules and mild bronchiectasis of the medial left upper lobe and medial segment right middle lobe. Stable, benign subpleural nodule of the lateral segment right middle lobe measuring 8 mm (series 5, image 29). No pleural effusion or pneumothorax.  Upper Abdomen: No acute abnormality.  Musculoskeletal: No chest wall mass or suspicious bone lesions identified.  IMPRESSION: 1. Stable benign small nodules, biapical pleuroparenchymal scarring, and post infectious or inflammatory scarring, unchanged compared to prior examination and definitively benign. No evidence of ongoing atypical infection. No further routine CT follow-up is required. 2. Hiatal hernia. 3. Coronary artery disease. Aortic Atherosclerosis (ICD10-I70.0).   Electronically Signed   By: Eddie Candle M.D.   On: 12/17/2019 15:48  I have personally reviewed the images and agree with radiologist interpretation.   Assessment/ Plan:  1. Microscopic hematuria CT A/P unremarkable for hematuria Chest CT stable as above Cysto today unremarkable   2. OAB  She is not sure if PTNS covered w/ her insurance but would like to proceed if possible F/u w/ Earnest Conroy, Lucas Mallow, am acting as a scribe for Dr. [name],  I have reviewed the above documentation for accuracy and completeness, and I agree with the above.   [name], MD

## 2019-12-21 ENCOUNTER — Encounter: Payer: Self-pay | Admitting: Urology

## 2019-12-21 ENCOUNTER — Other Ambulatory Visit: Payer: Self-pay

## 2019-12-21 ENCOUNTER — Ambulatory Visit: Payer: Medicare HMO | Admitting: Urology

## 2019-12-21 VITALS — BP 136/70 | HR 70

## 2019-12-21 DIAGNOSIS — R918 Other nonspecific abnormal finding of lung field: Secondary | ICD-10-CM | POA: Diagnosis not present

## 2019-12-21 LAB — URINALYSIS, COMPLETE
Bilirubin, UA: NEGATIVE
Glucose, UA: NEGATIVE
Ketones, UA: NEGATIVE
Leukocytes,UA: NEGATIVE
Nitrite, UA: NEGATIVE
Protein,UA: NEGATIVE
Specific Gravity, UA: 1.025 (ref 1.005–1.030)
Urobilinogen, Ur: 0.2 mg/dL (ref 0.2–1.0)
pH, UA: 5.5 (ref 5.0–7.5)

## 2019-12-21 LAB — MICROSCOPIC EXAMINATION: Bacteria, UA: NONE SEEN

## 2019-12-24 ENCOUNTER — Ambulatory Visit: Payer: Self-pay

## 2019-12-31 ENCOUNTER — Ambulatory Visit: Payer: Self-pay

## 2020-01-07 ENCOUNTER — Ambulatory Visit: Payer: Self-pay

## 2020-01-14 ENCOUNTER — Ambulatory Visit: Payer: Self-pay

## 2020-01-18 DIAGNOSIS — Z801 Family history of malignant neoplasm of trachea, bronchus and lung: Secondary | ICD-10-CM | POA: Diagnosis not present

## 2020-01-18 DIAGNOSIS — G8929 Other chronic pain: Secondary | ICD-10-CM | POA: Diagnosis not present

## 2020-01-18 DIAGNOSIS — K5900 Constipation, unspecified: Secondary | ICD-10-CM | POA: Diagnosis not present

## 2020-01-18 DIAGNOSIS — Z800 Family history of malignant neoplasm of digestive organs: Secondary | ICD-10-CM | POA: Diagnosis not present

## 2020-01-18 DIAGNOSIS — E785 Hyperlipidemia, unspecified: Secondary | ICD-10-CM | POA: Diagnosis not present

## 2020-01-18 DIAGNOSIS — R32 Unspecified urinary incontinence: Secondary | ICD-10-CM | POA: Diagnosis not present

## 2020-01-18 DIAGNOSIS — Z833 Family history of diabetes mellitus: Secondary | ICD-10-CM | POA: Diagnosis not present

## 2020-01-18 DIAGNOSIS — K08409 Partial loss of teeth, unspecified cause, unspecified class: Secondary | ICD-10-CM | POA: Diagnosis not present

## 2020-01-18 DIAGNOSIS — R030 Elevated blood-pressure reading, without diagnosis of hypertension: Secondary | ICD-10-CM | POA: Diagnosis not present

## 2020-01-18 DIAGNOSIS — Z8673 Personal history of transient ischemic attack (TIA), and cerebral infarction without residual deficits: Secondary | ICD-10-CM | POA: Diagnosis not present

## 2020-01-18 DIAGNOSIS — Z008 Encounter for other general examination: Secondary | ICD-10-CM | POA: Diagnosis not present

## 2020-01-21 ENCOUNTER — Ambulatory Visit: Payer: Self-pay

## 2020-01-21 ENCOUNTER — Other Ambulatory Visit: Payer: Self-pay | Admitting: Family Medicine

## 2020-01-21 DIAGNOSIS — G459 Transient cerebral ischemic attack, unspecified: Secondary | ICD-10-CM

## 2020-01-25 ENCOUNTER — Other Ambulatory Visit: Payer: Self-pay | Admitting: Physician Assistant

## 2020-01-25 DIAGNOSIS — G459 Transient cerebral ischemic attack, unspecified: Secondary | ICD-10-CM

## 2020-01-25 MED ORDER — ASPIRIN EC 81 MG PO TBEC: 81.0000 mg | DELAYED_RELEASE_TABLET | Freq: Every day | ORAL | 3 refills | Status: DC

## 2020-01-28 ENCOUNTER — Ambulatory Visit: Payer: Self-pay

## 2020-01-29 ENCOUNTER — Ambulatory Visit
Admission: EM | Admit: 2020-01-29 | Discharge: 2020-01-29 | Disposition: A | Discharge status: Home / Self Care | Payer: Medicare HMO | Attending: Physician Assistant | Admitting: Physician Assistant

## 2020-01-29 ENCOUNTER — Other Ambulatory Visit: Payer: Self-pay

## 2020-01-29 ENCOUNTER — Ambulatory Visit (INDEPENDENT_AMBULATORY_CARE_PROVIDER_SITE_OTHER): Payer: Medicare HMO

## 2020-01-29 DIAGNOSIS — W19XXXA Unspecified fall, initial encounter: Secondary | ICD-10-CM | POA: Diagnosis not present

## 2020-01-29 DIAGNOSIS — M25531 Pain in right wrist: Secondary | ICD-10-CM | POA: Diagnosis not present

## 2020-01-29 NOTE — ED Triage Notes (Signed)
Patient is here for assessment of right wrist pain that she states started after a fall that occurred x 3 months ago.  Home interventions have helped some, but pain is aggravated with movement.

## 2020-01-29 NOTE — Discharge Instructions (Signed)
As discussed, xray shows arthritis to the area you hurt. It did show a possible fracture to the thumb side of the wrist, so we are putting you in a wrist splint that also supports your thumb. Continue tylenol, can add ibuprofen, voltaren gel. Follow up with hand orthopedics for further evaluation needed.
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
## 2020-01-31 ENCOUNTER — Telehealth: Payer: Self-pay | Admitting: Physician Assistant

## 2020-01-31 DIAGNOSIS — M25531 Pain in right wrist: Secondary | ICD-10-CM

## 2020-01-31 NOTE — Telephone Encounter (Signed)
Referral to ortho placed.  Charyl Bigger, CMA

## 2020-01-31 NOTE — Addendum Note (Signed)
Addended by: Fonnie Mu on: 01/31/2020 11:39 AM   Modules accepted: Orders

## 2020-01-31 NOTE — Telephone Encounter (Signed)
Patient fell a few months ago and has been having wrist pain, she went to a [facility] and was advised to f/u with ortho. She needs a PCP referral for this, can we please place this referral for Cheryl Martin to work?

## 2020-02-04 ENCOUNTER — Ambulatory Visit: Payer: Self-pay

## 2020-02-04 ENCOUNTER — Ambulatory Visit: Payer: Medicare HMO | Admitting: Family Medicine

## 2020-02-04 ENCOUNTER — Encounter: Payer: Self-pay | Admitting: Family Medicine

## 2020-02-04 ENCOUNTER — Other Ambulatory Visit: Payer: Self-pay

## 2020-02-04 DIAGNOSIS — M25531 Pain in right wrist: Secondary | ICD-10-CM
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
## 2020-02-08 DIAGNOSIS — M25531 Pain in right wrist: Secondary | ICD-10-CM | POA: Diagnosis not present

## 2020-02-08 DIAGNOSIS — M25641 Stiffness of right hand, not elsewhere classified: Secondary | ICD-10-CM | POA: Diagnosis not present

## 2020-02-08 DIAGNOSIS — W19XXXS Unspecified fall, sequela: Secondary | ICD-10-CM | POA: Diagnosis not present

## 2020-02-08 DIAGNOSIS — M25541 Pain in joints of right hand: Secondary | ICD-10-CM | POA: Diagnosis not present

## 2020-02-08 DIAGNOSIS — M25631 Stiffness of right wrist, not elsewhere classified: Secondary | ICD-10-CM | POA: Diagnosis not present

## 2020-02-11 ENCOUNTER — Ambulatory Visit: Payer: Self-pay

## 2020-02-15 NOTE — Progress Notes (Signed)
Patient was scheduled for virtual visit today however she was out of the state visiting [place] thus we will reschedule for in person visit.  [name], MD

## 2020-02-16 ENCOUNTER — Telehealth (INDEPENDENT_AMBULATORY_CARE_PROVIDER_SITE_OTHER): Payer: Medicare HMO | Admitting: Urology

## 2020-02-16 ENCOUNTER — Encounter: Payer: Self-pay | Admitting: Urology

## 2020-02-16 NOTE — Progress Notes (Signed)
This service is provided via telemedicine   No vital signs collected/recorded due to the encounter was a telemedicine visit.     Patient consents to a telephone visit:  Yes    Names of all persons participating in the telemedicine service and their role in the encounter:  Aubryn Spinola, CMA   

## 2020-02-18 ENCOUNTER — Ambulatory Visit: Payer: Self-pay

## 2020-02-20 DIAGNOSIS — J189 Pneumonia, unspecified organism: Secondary | ICD-10-CM

## 2020-02-20 HISTORY — DX: Pneumonia, unspecified organism: J18.9

## 2020-02-23 ENCOUNTER — Telehealth: Payer: Self-pay | Admitting: Physician Assistant

## 2020-02-23 DIAGNOSIS — O084 Renal failure following ectopic and molar pregnancy: Secondary | ICD-10-CM

## 2020-02-23 DIAGNOSIS — D229 Melanocytic nevi, unspecified: Secondary | ICD-10-CM

## 2020-02-23 NOTE — Telephone Encounter (Signed)
Referral has been placed. AS, CMA 

## 2020-02-23 NOTE — Addendum Note (Signed)
Addended by: Mickel Crow on: 02/23/2020 09:18 AM   Modules accepted: Orders

## 2020-02-23 NOTE — Telephone Encounter (Signed)
Patient is requesting a derm referral for a abnormal mole that ortho provider discovered, can I please get a referral for this?

## 2020-02-25 ENCOUNTER — Ambulatory Visit: Payer: Self-pay

## 2020-02-28 DIAGNOSIS — W19XXXS Unspecified fall, sequela: Secondary | ICD-10-CM | POA: Diagnosis not present

## 2020-02-28 DIAGNOSIS — M25531 Pain in right wrist: Secondary | ICD-10-CM | POA: Diagnosis not present

## 2020-02-28 DIAGNOSIS — M25641 Stiffness of right hand, not elsewhere classified: Secondary | ICD-10-CM | POA: Diagnosis not present

## 2020-02-28 DIAGNOSIS — M25541 Pain in joints of right hand: Secondary | ICD-10-CM | POA: Diagnosis not present

## 2020-02-28 DIAGNOSIS — M25631 Stiffness of right wrist, not elsewhere classified: Secondary | ICD-10-CM | POA: Diagnosis not present
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
## 2020-02-29 ENCOUNTER — Telehealth: Payer: Self-pay | Admitting: *Deleted

## 2020-02-29 ENCOUNTER — Other Ambulatory Visit: Payer: Self-pay

## 2020-02-29 ENCOUNTER — Ambulatory Visit: Payer: Medicare HMO | Admitting: Urology

## 2020-02-29 VITALS — BP 133/77 | HR 66 | Ht 63.0 in | Wt 135.0 lb

## 2020-02-29 DIAGNOSIS — N3941 Urge incontinence: Secondary | ICD-10-CM | POA: Diagnosis not present

## 2020-02-29 NOTE — Telephone Encounter (Signed)
   [facility] Pre-operative Risk Assessment    HEARTCARE STAFF: - Please ensure there is not already an duplicate clearance open for this procedure. - Under Visit Info/Reason for Call, type in Other and utilize the format Clearance MM/DD/YY or Clearance TBD. Do not use dashes or single digits. - If request is for dental extraction, please clarify the # of teeth to be extracted.  Request for surgical clearance:  1. What type of surgery is being performed?  BOTOX INJECTIONS INTO BLADDER   2. When is this surgery scheduled?  03/13/20   3. What type of clearance is required (medical clearance vs. Pharmacy clearance to hold med vs. Both)?  BOTH  4. Are there any medications that need to be held prior to surgery and how long? ASPIRIN X'S 7 DAYS   5. Practice name and name of physician performing surgery?  [facility]   6. What is the office phone number?  [phone]   7.   What is the office fax number?  [phone]  8.   Anesthesia type (None, local, MAC, general) ?     Jeanann Lewandowsky 02/29/2020, 2:31 PM  _________________________________________________________________   (provider comments below)

## 2020-02-29 NOTE — Telephone Encounter (Signed)
Dr Meda Coffee can you comment on the request to hold aspirin for 7 days in this patient prior to bladder Botox injections.  Chest CTA  Dec 2019 showed LAD disease, Myoview 11/09/2018 was low risk.  You saw her last in Jan 2021- she was doing well then. Please respond to CV DIV PRE OP  thanks  LUKE KILROY PA-C 02/29/2020 3:10 PM

## 2020-02-29 NOTE — Telephone Encounter (Signed)
OK to hold ASA for Botox injections

## 2020-02-29 NOTE — Patient Instructions (Signed)
   Step 1 Get all of your supplies ready and place near you. Step 2 Wash your hands or put on gloves. Step 3 Wash around urethral opening with warm antibacterial/ hypoallergenic soapy water from front to back. Step 4 take the catheter out of the package and drain the lubricant over toilet. Step 5 Sit on the toilet and spread your legs to begin catheterization. Step 6 Use your fingers to spread the labia and feel for the urethra.             A MIRROR MAY BE HELPFUL AT FIRST Step 7 Insert the catheter slowly into the urethra. If there is resistance when the catheter reaches the the sphincter muscle,              take a deep breath and gently apply steady pressure.            DO NOT FORCE THE CATHETER Step 8 When the urine begins to flow insert another inch, allow the urine to flow into the toilet. Step 9 When the flow of urine stops, slowly remove the catheter.  

## 2020-02-29 NOTE — Telephone Encounter (Signed)
   Primary Cardiologist: Dr Radford Pax  Chart reviewed as part of pre-operative protocol coverage. Given past medical history and time since last visit, based on ACC/AHA guidelines, ISOLDE SKAFF would be at acceptable risk for the planned procedure without further cardiovascular testing.   Ok to hold aspirin if needed pre op- resume as soon as safe post op.  I will route this recommendation to the requesting party via Epic fax function and remove from pre-op pool.  Please call with questions.  Kerin Ransom, PA-C 02/29/2020, 4:04 PM

## 2020-03-01 ENCOUNTER — Ambulatory Visit (INDEPENDENT_AMBULATORY_CARE_PROVIDER_SITE_OTHER): Payer: Medicare HMO | Admitting: Physician Assistant

## 2020-03-01 ENCOUNTER — Other Ambulatory Visit: Payer: Self-pay | Admitting: Radiology

## 2020-03-01 ENCOUNTER — Encounter: Payer: Self-pay | Admitting: Physician Assistant

## 2020-03-01 VITALS — BP 104/62 | HR 66 | Temp 98.2°F | Ht 63.5 in | Wt 137.3 lb

## 2020-03-01 DIAGNOSIS — Z0000 Encounter for general adult medical examination without abnormal findings: Secondary | ICD-10-CM

## 2020-03-01 DIAGNOSIS — Z23 Encounter for immunization: Secondary | ICD-10-CM

## 2020-03-01 DIAGNOSIS — Z780 Asymptomatic menopausal state: Secondary | ICD-10-CM

## 2020-03-01 DIAGNOSIS — E559 Vitamin D deficiency, unspecified: Secondary | ICD-10-CM | POA: Diagnosis not present

## 2020-03-01 DIAGNOSIS — E785 Hyperlipidemia, unspecified: Secondary | ICD-10-CM

## 2020-03-01 DIAGNOSIS — N3941 Urge incontinence: Secondary | ICD-10-CM

## 2020-03-01 DIAGNOSIS — N3281 Overactive bladder: Secondary | ICD-10-CM

## 2020-03-01 MED ORDER — ONABOTULINUMTOXINA 100 UNITS IJ SOLR: 100.0000 [IU] | INTRAMUSCULAR | Status: AC

## 2020-03-01 MED ORDER — SHINGRIX 50 MCG/0.5ML IM SUSR: 0.5000 mL | Freq: Once | INTRAMUSCULAR | 0 refills | Status: AC
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
## 2020-03-01 NOTE — Patient Instructions (Signed)
Preventive Care 38 Years and Older, Female Preventive care refers to lifestyle choices and visits with your health care provider that can promote health and wellness. This includes:  A yearly physical exam. This is also called an annual well check.  Regular dental and eye exams.  Immunizations.  Screening for certain conditions.  Healthy lifestyle choices, such as diet and exercise. What can I expect for my preventive care visit? Physical exam Your health care provider will check:  Height and weight. These may be used to calculate body mass index (BMI), which is a measurement that tells if you are at a healthy weight.  Heart rate and blood pressure.  Your skin for abnormal spots. Counseling Your health care provider may ask you questions about:  Alcohol, tobacco, and drug use.  Emotional well-being.  Home and relationship well-being.  Sexual activity.  Eating habits.  History of falls.  Memory and ability to understand (cognition).  Work and work Statistician.  Pregnancy and menstrual history. What immunizations do I need?  Influenza (flu) vaccine  This is recommended every year. Tetanus, diphtheria, and pertussis (Tdap) vaccine  You may need a Td booster every 10 years. Varicella (chickenpox) vaccine  You may need this vaccine if you have not already been vaccinated. Zoster (shingles) vaccine  You may need this after age 84. Pneumococcal conjugate (PCV13) vaccine  One dose is recommended after age 84. Pneumococcal polysaccharide (PPSV23) vaccine  One dose is recommended after age 84. Measles, mumps, and rubella (MMR) vaccine  You may need at least one dose of MMR if you were born in 1957 or later. You may also need a second dose. Meningococcal conjugate (MenACWY) vaccine  You may need this if you have certain conditions. Hepatitis A vaccine  You may need this if you have certain conditions or if you travel or work in places where you may be exposed  to hepatitis A. Hepatitis B vaccine  You may need this if you have certain conditions or if you travel or work in places where you may be exposed to hepatitis B. Haemophilus influenzae type b (Hib) vaccine  You may need this if you have certain conditions. You may receive vaccines as individual doses or as more than one vaccine together in one shot (combination vaccines). Talk with your health care provider about the risks and benefits of combination vaccines. What tests do I need? Blood tests  Lipid and cholesterol levels. These may be checked every 5 years, or more frequently depending on your overall health.  Hepatitis C test.  Hepatitis B test. Screening  Lung cancer screening. You may have this screening every year starting at age 84 if you have a 30-pack-year history of smoking and currently smoke or have quit within the past 15 years.  Colorectal cancer screening. All adults should have this screening starting at age 84 and continuing until age 84. Your health care provider may recommend screening at age 84 if you are at increased risk. You will have tests every 1-10 years, depending on your results and the type of screening test.  Diabetes screening. This is done by checking your blood sugar (glucose) after you have not eaten for a while (fasting). You may have this done every 1-3 years.  Mammogram. This may be done every 1-2 years. Talk with your health care provider about how often you should have regular mammograms.  BRCA-related cancer screening. This may be done if you have a family history of breast, ovarian, tubal, or peritoneal cancers.  Other tests  Sexually transmitted disease (STD) testing.  Bone density scan. This is done to screen for osteoporosis. You may have this done starting at age 84. Follow these instructions at home: Eating and drinking  Eat a diet that includes fresh fruits and vegetables, whole grains, lean protein, and low-fat dairy products. Limit  your intake of foods with high amounts of sugar, saturated fats, and salt.  Take vitamin and mineral supplements as recommended by your health care provider.  Do not drink alcohol if your health care provider tells you not to drink.  If you drink alcohol: ? Limit how much you have to 0-1 drink a day. ? Be aware of how much alcohol is in your drink. In the [place] one drink equals one 12 oz bottle of beer (355 mL), one 5 oz glass of wine (148 mL), or one 1 oz glass of hard liquor (44 mL). Lifestyle  Take daily care of your teeth and gums.  Stay active. Exercise for at least 30 minutes on 5 or more days each week.  Do not use any products that contain nicotine or tobacco, such as cigarettes, e-cigarettes, and chewing tobacco. If you need help quitting, ask your health care provider.  If you are sexually active, practice safe sex. Use a condom or other form of protection in order to prevent STIs (sexually transmitted infections).  Talk with your health care provider about taking a low-dose aspirin or statin. What's next?  Go to your health care provider once a year for a well check visit.  Ask your health care provider how often you should have your eyes and teeth checked.  Stay up to date on all vaccines. This information is not intended to replace advice given to you by your health care provider. Make sure you discuss any questions you have with your health care provider. Document Revised: 07/02/2018 Document Reviewed: 07/02/2018 Elsevier Patient Education  2020 [employer].

## 2020-03-02 ENCOUNTER — Other Ambulatory Visit: Payer: Medicare HMO

## 2020-03-02 ENCOUNTER — Other Ambulatory Visit: Payer: Self-pay

## 2020-03-02 DIAGNOSIS — E785 Hyperlipidemia, unspecified: Secondary | ICD-10-CM

## 2020-03-02 DIAGNOSIS — E559 Vitamin D deficiency, unspecified: Secondary | ICD-10-CM | POA: Diagnosis not present

## 2020-03-02 DIAGNOSIS — Z0000 Encounter for general adult medical examination without abnormal findings: Secondary | ICD-10-CM

## 2020-03-03 ENCOUNTER — Other Ambulatory Visit: Payer: Self-pay

## 2020-03-03 ENCOUNTER — Ambulatory Visit: Payer: Self-pay

## 2020-03-03 ENCOUNTER — Encounter: Payer: Self-pay | Admitting: Family Medicine

## 2020-03-03 ENCOUNTER — Ambulatory Visit (INDEPENDENT_AMBULATORY_CARE_PROVIDER_SITE_OTHER): Payer: Medicare HMO

## 2020-03-03 ENCOUNTER — Other Ambulatory Visit: Payer: Medicare HMO

## 2020-03-03 ENCOUNTER — Ambulatory Visit (INDEPENDENT_AMBULATORY_CARE_PROVIDER_SITE_OTHER): Payer: Medicare HMO | Admitting: Family Medicine

## 2020-03-03 DIAGNOSIS — N3941 Urge incontinence: Secondary | ICD-10-CM

## 2020-03-03 DIAGNOSIS — M25531 Pain in right wrist: Secondary | ICD-10-CM

## 2020-03-03 LAB — URINALYSIS, COMPLETE
Bilirubin, UA: NEGATIVE
Glucose, UA: NEGATIVE
Ketones, UA: NEGATIVE
Leukocytes,UA: NEGATIVE
Nitrite, UA: NEGATIVE
Protein,UA: NEGATIVE
Specific Gravity, UA: 1.02 (ref 1.005–1.030)
Urobilinogen, Ur: 0.2 mg/dL (ref 0.2–1.0)
pH, UA: 8.5 — ABNORMAL HIGH (ref 5.0–7.5)

## 2020-03-03 LAB — CBC WITH DIFFERENTIAL/PLATELET
Basophils Absolute: 0.1 10*3/uL (ref 0.0–0.2)
Basos: 1 %
EOS (ABSOLUTE): 0.5 10*3/uL — ABNORMAL HIGH (ref 0.0–0.4)
Eos: 11 %
Hematocrit: 38.7 % (ref 34.0–46.6)
Hemoglobin: 12.9 g/dL (ref 11.1–15.9)
Immature Grans (Abs): 0 10*3/uL (ref 0.0–0.1)
Immature Granulocytes: 0 %
Lymphocytes Absolute: 0.8 10*3/uL (ref 0.7–3.1)
Lymphs: 17 %
MCH: 30.3 pg (ref 26.6–33.0)
MCHC: 33.3 g/dL (ref 31.5–35.7)
MCV: 91 fL (ref 79–97)
Monocytes Absolute: 0.7 10*3/uL (ref 0.1–0.9)
Monocytes: 14 %
Neutrophils Absolute: 2.6 10*3/uL (ref 1.4–7.0)
Neutrophils: 57 %
Platelets: 199 10*3/uL (ref 150–450)
RBC: 4.26 x10E6/uL (ref 3.77–5.28)
RDW: 12.1 % (ref 11.7–15.4)
WBC: 4.6 10*3/uL (ref 3.4–10.8)

## 2020-03-03 LAB — MICROSCOPIC EXAMINATION: Bacteria, UA: NONE SEEN

## 2020-03-03 LAB — COMPREHENSIVE METABOLIC PANEL
ALT: 17 IU/L (ref 0–32)
AST: 25 IU/L (ref 0–40)
Albumin/Globulin Ratio: 2.4 — ABNORMAL HIGH (ref 1.2–2.2)
Albumin: 4.1 g/dL (ref 3.6–4.6)
Alkaline Phosphatase: 107 IU/L (ref 48–121)
BUN/Creatinine Ratio: 22 (ref 12–28)
BUN: 16 mg/dL (ref 8–27)
Bilirubin Total: 0.5 mg/dL (ref 0.0–1.2)
CO2: 23 mmol/L (ref 20–29)
Calcium: 9.5 mg/dL (ref 8.7–10.3)
Chloride: 100 mmol/L (ref 96–106)
Creatinine, Ser: 0.74 mg/dL (ref 0.57–1.00)
GFR calc Af Amer: 85 mL/min/{1.73_m2} (ref >59)
GFR calc non Af Amer: 74 mL/min/{1.73_m2} (ref >59)
Globulin, Total: 1.7 g/dL (ref 1.5–4.5)
Glucose: 85 mg/dL (ref 65–99)
Potassium: 4.3 mmol/L (ref 3.5–5.2)
Sodium: 136 mmol/L (ref 134–144)
Total Protein: 5.8 g/dL — ABNORMAL LOW (ref 6.0–8.5)

## 2020-03-03 LAB — LIPID PANEL
Chol/HDL Ratio: 2.3 ratio (ref 0.0–4.4)
Cholesterol, Total: 139 mg/dL (ref 100–199)
HDL: 61 mg/dL (ref >39)
LDL Chol Calc (NIH): 62 mg/dL (ref 0–99)
Triglycerides: 86 mg/dL (ref 0–149)
VLDL Cholesterol Cal: 16 mg/dL (ref 5–40)

## 2020-03-03 LAB — HEMOGLOBIN A1C
Est. average glucose Bld gHb Est-mCnc: 105 mg/dL
Hgb A1c MFr Bld: 5.3 % (ref 4.8–5.6)

## 2020-03-03 LAB — VITAMIN D 25 HYDROXY (VIT D DEFICIENCY, FRACTURES): Vit D, 25-Hydroxy: 42.1 ng/mL (ref 30.0–100.0)

## 2020-03-03 LAB — TSH: TSH: 2.53 u[IU]/mL (ref 0.450–4.500)
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
## 2020-03-06 LAB — CULTURE, URINE COMPREHENSIVE

## 2020-03-07 ENCOUNTER — Other Ambulatory Visit: Payer: Self-pay

## 2020-03-07 ENCOUNTER — Other Ambulatory Visit
Admission: RE | Admit: 2020-03-07 | Discharge: 2020-03-07 | Disposition: A | Discharge status: Home / Self Care | Payer: Medicare HMO | Source: Ambulatory Visit | Attending: Urology | Admitting: Urology

## 2020-03-07 HISTORY — DX: Other intervertebral disc degeneration, lumbar region without mention of lumbar back pain or lower extremity pain: M51.369

## 2020-03-07 HISTORY — DX: Hyperlipidemia, unspecified: E78.5

## 2020-03-07 HISTORY — DX: Atherosclerotic heart disease of native coronary artery without angina pectoris: I25.10

## 2020-03-07 HISTORY — DX: Other intervertebral disc degeneration, lumbar region: M51.36

## 2020-03-07 HISTORY — DX: Cardiac arrhythmia, unspecified: I49.9

## 2020-03-07 NOTE — Pre-Procedure Instructions (Signed)
Patient had an EKG in February 2021 that indicated Atrial Fibrillation. During her preop interview, she stated that she was unaware of this rhythm change.  For this reason, a current EKG has been ordered even with the cardiac clearance on file.

## 2020-03-07 NOTE — Patient Instructions (Signed)
INSTRUCTIONS FOR SURGERY     Your surgery is scheduled for:  Monday, AUGUST 23RD     To find out your arrival time for the day of surgery,          please call [phone] between 1 pm and 3 pm on :  Friday, AUGUST 20TH     When you arrive for surgery, report to the [facility].       Do NOT stop on the first floor to register.    REMEMBER: Instructions that are not followed completely may result in serious medical risk,  up to and including death, or upon the discretion of your surgeon and anesthesiologist,            your surgery may need to be rescheduled.  __X__ 1. Do not eat food after midnight the night before your procedure.                    No gum, candy, lozenger, tic tacs, tums or hard candies.                  ABSOLUTELY NOTHING SOLID IN YOUR MOUTH AFTER MIDNIGHT                    You may drink unlimited clear liquids up to 2 hours before you are scheduled to arrive for surgery.                   Do not drink anything within those 2 hours unless you need to take medicine, then take the                   smallest amount you need.  Clear liquids include:  water, apple juice without pulp,                   any flavor Gatorade, Black coffee, black tea.  Sugar may be added but no dairy/ honey /lemon.                        Broth and jello is not considered a clear liquid.  __x__  2. On the morning of surgery, please brush your teeth with toothpaste and water. You may rinse with                  mouthwash if you wish but DO NOT SWALLOW TOOTHPASTE OR MOUTHWASH  __X___3. NO alcohol for 24 hours before or after surgery.  __x___ 4.  Do NOT smoke or use e-cigarettes for 24 HOURS PRIOR TO SURGERY.                      DO NOT Use any chewable tobacco products for at least 6 hours prior to surgery.  __x___ 5. If you start any new medication after this appointment and prior to surgery, please                    Bring it with you on the day of surgery.  ___x__ 6. Notify your doctor if there is any change in your  medical condition, such as fever,                   infection, vomitting, diarrhea or any open sores.  __x___ 7.  USE ANTIBACTERIAL SOAP as instructed, the night before surgery and the                    day of surgery.  Once you have washed with this soap, do NOT use any of the                    following: Powders, perfumes or lotions. Please do not wear make up,                   hairpins, clips or nail polish. You may wear deodorant.                   Men may shave their face and neck.  Women need to shave 48 hours prior to surgery.                   DO NOT wear ANY jewelry on the day of surgery. If there are rings that are too tight to                    remove easily, please address this prior to the surgery day. Piercings need to be removed.                                                                     NO METAL ON YOUR BODY.                    Do NOT bring any valuables.  If you came to Pre-Admit testing then you will not need license,                     insurance card or credit card.  If you will be staying overnight, please either leave your things in                     the car or have your family be responsible for these items.                     [facility] IS NOT RESPONSIBLE FOR BELONGINGS OR VALUABLES.  ___X__ 8. DO NOT wear contact lenses on surgery day.  You may not have dentures,                     Hearing aides, contacts or glasses in the operating room. These items can be                    Placed in the Recovery Room to receive immediately after surgery.  __x___ 9. IF YOU ARE SCHEDULED TO GO HOME ON THE SAME DAY, YOU MUST                   Have someone to drive you home and to stay with you  for the first 24 hours.  Have an arrangement prior to arriving on surgery day.  ___x__ 10. Take the following medications on the morning of surgery with  a sip of water:                              1. DRY EYE EYE DROPS, if needed                     2.  _____ 11.  Follow any instructions provided to you by your surgeon.                        Such as enema, clear liquid bowel prep  __X__  12. STOP  ASPIRIN AS OF: TODAY, AUGUST 17TH                       THIS INCLUDES BC POWDERS / GOODIES POWDER  __x___ 13. STOP Anti-inflammatories as of: TODAY, AUGUST 17TH                      This includes IBUPROFEN / MOTRIN / ADVIL / ALEVE/ NAPROXYN                    YOU MAY TAKE TYLENOL ANY TIME PRIOR TO SURGERY.  __X___ 26.  Stop supplements until after surgery.                     This includes: BIOTIN // VITAMIN C // MULTIVITAMINS // PROBIOTIC                 You may continue taking Vitamin B12 / Vitamin D3 but do not take on the morning of surgery.  __X____17.  Continue to take the following medications but do not take on the morning of surgery:                           COLACE // MIRALAX //   __X____18.   Wear clean and comfortable clothing to the hospital.  [place] PHONE NUMBERS FOR YOUR CONTACT PERSON.

## 2020-03-09 ENCOUNTER — Other Ambulatory Visit
Admission: RE | Admit: 2020-03-09 | Discharge: 2020-03-09 | Disposition: A | Discharge status: Home / Self Care | Payer: Medicare HMO | Source: Ambulatory Visit | Attending: Urology | Admitting: Urology

## 2020-03-09 ENCOUNTER — Other Ambulatory Visit: Payer: Self-pay

## 2020-03-09 DIAGNOSIS — Z01818 Encounter for other preprocedural examination: Secondary | ICD-10-CM | POA: Diagnosis not present

## 2020-03-09 DIAGNOSIS — Z8679 Personal history of other diseases of the circulatory system: Secondary | ICD-10-CM | POA: Diagnosis not present

## 2020-03-09 DIAGNOSIS — Z20822 Contact with and (suspected) exposure to covid-19: Secondary | ICD-10-CM | POA: Insufficient documentation
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
## 2020-03-10 ENCOUNTER — Ambulatory Visit: Payer: Self-pay

## 2020-03-10 LAB — SARS CORONAVIRUS 2 (TAT 6-24 HRS): SARS Coronavirus 2: NEGATIVE

## 2020-03-13 ENCOUNTER — Other Ambulatory Visit: Payer: Self-pay

## 2020-03-13 ENCOUNTER — Ambulatory Visit: Payer: Medicare HMO | Admitting: Urgent Care

## 2020-03-13 ENCOUNTER — Ambulatory Visit
Admission: RE | Admit: 2020-03-13 | Discharge: 2020-03-13 | Disposition: A | Discharge status: Home / Self Care | Payer: Medicare HMO | Attending: Urology | Admitting: Urology

## 2020-03-13 ENCOUNTER — Encounter
Admission: RE | Disposition: A | Discharge status: Home / Self Care | Payer: Self-pay | Source: Home / Self Care | Attending: Urology

## 2020-03-13 ENCOUNTER — Encounter: Payer: Self-pay | Admitting: Urology

## 2020-03-13 DIAGNOSIS — Z884 Allergy status to anesthetic agent status: Secondary | ICD-10-CM | POA: Diagnosis not present

## 2020-03-13 DIAGNOSIS — M1990 Unspecified osteoarthritis, unspecified site: Secondary | ICD-10-CM | POA: Diagnosis not present

## 2020-03-13 DIAGNOSIS — I4891 Unspecified atrial fibrillation: Secondary | ICD-10-CM | POA: Insufficient documentation

## 2020-03-13 DIAGNOSIS — Z79899 Other long term (current) drug therapy: Secondary | ICD-10-CM | POA: Insufficient documentation

## 2020-03-13 DIAGNOSIS — Z9049 Acquired absence of other specified parts of digestive tract: Secondary | ICD-10-CM | POA: Diagnosis not present

## 2020-03-13 DIAGNOSIS — Z87891 Personal history of nicotine dependence: Secondary | ICD-10-CM | POA: Insufficient documentation

## 2020-03-13 DIAGNOSIS — K449 Diaphragmatic hernia without obstruction or gangrene: Secondary | ICD-10-CM | POA: Diagnosis not present

## 2020-03-13 DIAGNOSIS — N3281 Overactive bladder: Secondary | ICD-10-CM | POA: Diagnosis not present

## 2020-03-13 DIAGNOSIS — N3941 Urge incontinence: Secondary | ICD-10-CM | POA: Insufficient documentation

## 2020-03-13 DIAGNOSIS — E785 Hyperlipidemia, unspecified: Secondary | ICD-10-CM | POA: Diagnosis not present

## 2020-03-13 DIAGNOSIS — Z888 Allergy status to other drugs, medicaments and biological substances status: Secondary | ICD-10-CM | POA: Diagnosis not present

## 2020-03-13 DIAGNOSIS — I2510 Atherosclerotic heart disease of native coronary artery without angina pectoris: Secondary | ICD-10-CM | POA: Insufficient documentation

## 2020-03-13 DIAGNOSIS — G709 Myoneural disorder, unspecified: Secondary | ICD-10-CM | POA: Diagnosis not present

## 2020-03-13 DIAGNOSIS — Z8673 Personal history of transient ischemic attack (TIA), and cerebral infarction without residual deficits: Secondary | ICD-10-CM | POA: Diagnosis not present

## 2020-03-13 HISTORY — PX: BOTOX INJECTION: SHX5754

## 2020-03-13 SURGERY — BOTOX INJECTION
Anesthesia: General

## 2020-03-13 MED ORDER — CHLORHEXIDINE GLUCONATE 0.12 % MT SOLN: 15.0000 mL | Freq: Once | OROMUCOSAL | Status: AC

## 2020-03-13 MED ORDER — CEFAZOLIN SODIUM-DEXTROSE 2-4 GM/100ML-% IV SOLN
2.0000 g | INTRAVENOUS | Status: AC
  Administered 2020-03-13: 2 g via INTRAVENOUS

## 2020-03-13 MED ORDER — FAMOTIDINE 20 MG PO TABS: 20.0000 mg | ORAL_TABLET | Freq: Once | ORAL | Status: AC

## 2020-03-13 MED ORDER — PROPOFOL 500 MG/50ML IV EMUL
INTRAVENOUS | Status: AC
  Filled 2020-03-13: qty 50

## 2020-03-13 MED ORDER — PROPOFOL 10 MG/ML IV BOLUS
INTRAVENOUS | Status: AC
  Filled 2020-03-13: qty 20

## 2020-03-13 MED ORDER — LACTATED RINGERS IV SOLN: INTRAVENOUS | Status: DC

## 2020-03-13 MED ORDER — SODIUM CHLORIDE FLUSH 0.9 % IV SOLN
INTRAVENOUS | Status: AC
  Filled 2020-03-13: qty 20

## 2020-03-13 MED ORDER — EPHEDRINE SULFATE 50 MG/ML IJ SOLN
INTRAMUSCULAR | Status: DC | PRN
  Administered 2020-03-13: 10 mg via INTRAVENOUS

## 2020-03-13 MED ORDER — DEXAMETHASONE SODIUM PHOSPHATE 10 MG/ML IJ SOLN
INTRAMUSCULAR | Status: DC | PRN
  Administered 2020-03-13: 10 mg via INTRAVENOUS

## 2020-03-13 MED ORDER — PROPOFOL 500 MG/50ML IV EMUL
INTRAVENOUS | Status: DC | PRN
  Administered 2020-03-13: 75 ug/kg/min via INTRAVENOUS

## 2020-03-13 MED ORDER — ONABOTULINUMTOXINA 100 UNITS IJ SOLR
INTRAMUSCULAR | Status: DC | PRN
  Administered 2020-03-13: 100 [IU] via INTRAMUSCULAR

## 2020-03-13 MED ORDER — ORAL CARE MOUTH RINSE: 15.0000 mL | Freq: Once | OROMUCOSAL | Status: AC

## 2020-03-13 MED ORDER — CHLORHEXIDINE GLUCONATE 0.12 % MT SOLN
OROMUCOSAL | Status: AC
  Administered 2020-03-13: 15 mL via OROMUCOSAL
  Filled 2020-03-13: qty 15

## 2020-03-13 MED ORDER — FENTANYL CITRATE (PF) 100 MCG/2ML IJ SOLN
INTRAMUSCULAR | Status: AC
  Filled 2020-03-13: qty 2

## 2020-03-13 MED ORDER — ONDANSETRON HCL 4 MG/2ML IJ SOLN
INTRAMUSCULAR | Status: DC | PRN
  Administered 2020-03-13: 4 mg via INTRAVENOUS

## 2020-03-13 MED ORDER — ONDANSETRON HCL 4 MG/2ML IJ SOLN: 4.0000 mg | Freq: Once | INTRAMUSCULAR | Status: DC | PRN

## 2020-03-13 MED ORDER — FAMOTIDINE 20 MG PO TABS
ORAL_TABLET | ORAL | Status: AC
  Administered 2020-03-13: 20 mg via ORAL
  Filled 2020-03-13: qty 1

## 2020-03-13 MED ORDER — FENTANYL CITRATE (PF) 100 MCG/2ML IJ SOLN
INTRAMUSCULAR | Status: DC | PRN
Start: 2020-03-13 — End: 2020-03-13
  Administered 2020-03-13 (×2): 25 ug via INTRAVENOUS

## 2020-03-13 MED ORDER — SODIUM CHLORIDE (PF) 0.9 % IJ SOLN
INTRAMUSCULAR | Status: DC | PRN
  Administered 2020-03-13: 11 mL via INTRAVENOUS

## 2020-03-13 MED ORDER — CEFAZOLIN SODIUM-DEXTROSE 2-4 GM/100ML-% IV SOLN
INTRAVENOUS | Status: AC
  Filled 2020-03-13: qty 100

## 2020-03-13 MED ORDER — FENTANYL CITRATE (PF) 100 MCG/2ML IJ SOLN: 25.0000 ug | INTRAMUSCULAR | Status: DC | PRN

## 2020-03-13 SURGICAL SUPPLY — 16 items
BAG DRAIN CYSTO-URO LG1000N (MISCELLANEOUS) ×2 IMPLANT
GLOVE BIO SURGEON STRL SZ 6.5 (GLOVE) ×2 IMPLANT
GOWN STRL REUS W/ TWL LRG LVL3 (GOWN DISPOSABLE) ×2 IMPLANT
GOWN STRL REUS W/TWL LRG LVL3 (GOWN DISPOSABLE) ×4
NDL INJETAK FLEX 70CM BOTOX (NEEDLE) ×1 IMPLANT
NDL SAFETY ECLIPSE 18X1.5 (NEEDLE) ×2 IMPLANT
NEEDLE HYPO 18GX1.5 SHARP (NEEDLE) ×4
NEEDLE INJETAK FLEX 70CM BOTOX (NEEDLE) ×2 IMPLANT
PACK CYSTO AR (MISCELLANEOUS) ×2 IMPLANT
SET CYSTO W/LG BORE CLAMP LF (SET/KITS/TRAYS/PACK) ×2 IMPLANT
SOL .9 NS 3000ML IRR  AL (IV SOLUTION) ×2
SOL .9 NS 3000ML IRR AL (IV SOLUTION) ×1
SOL .9 NS 3000ML IRR UROMATIC (IV SOLUTION) ×1 IMPLANT
SURGILUBE 2OZ TUBE FLIPTOP (MISCELLANEOUS) ×2 IMPLANT
SYR 10ML LL (SYRINGE) ×4 IMPLANT
WATER STERILE IRR 1000ML POUR (IV SOLUTION) ×2 IMPLANT

## 2020-03-13 NOTE — Op Note (Signed)
Date of procedure: 03/13/20  Preoperative diagnosis:  1. Refractory OAB  Postoperative diagnosis:  1. Same as above  Procedure: 1. Cystoscopy 2. Intravesical injection of botulinum toxin  Surgeon: [name], MD  Anesthesia: General  Complications: None  Intraoperative findings: Uncomplicated procedure  EBL: Minimal  Specimens: None  Drains: None  Indication: Cheryl Martin is a 86 y.o. patient with severe refractory OAB.  After reviewing the management options for treatment, the patient elected to proceed with the above surgical procedure(s). We have discussed the potential benefits and risks of the procedure, side effects of the proposed treatment, the likelihood of the patient achieving the goals of the procedure, and any potential problems that might occur during the procedure or recuperation. Informed consent has been obtained.  Description of procedure:  The patient was taken to the operating room and general anesthesia was induced.  The patient was placed in the dorsal lithotomy position, prepped and draped in the usual sterile fashion, and preoperative antibiotics were administered. A preoperative time-out was performed.   A 21 French rigid cystoscope was advanced per urethra into the bladder.  The bladder was carefully inspected and was unremarkable. At this point time, a total of 100 units of botulinum toxin was injected into the muscularis propria of the bladder in a total of 20 0.5 cc injections into Rose across the posterior bladder wall.  The procedure was uncomplicated.  Hemostasis was excellent.  The bladder was drained and the scope was removed.  The patient was then clean and dry, repositioned the supine position, reversed by anesthesia, and taken to the PACU in stable condition  Plan: Patient will follow up in a few weeks.  They have been taught how to do catheterize themselves intermittently as needed if they are unable to void and will contact us in  this situation for guidance.    [name], M.D.

## 2020-03-13 NOTE — Transfer of Care (Signed)
Immediate Anesthesia Transfer of Care Note  Patient: Cheryl Martin  Procedure(s) Performed: BOTOX INJECTION (N/A )  Patient Location: PACU  Anesthesia Type:MAC  Level of Consciousness: awake, alert  and oriented  Airway & Oxygen Therapy: Patient Spontanous Breathing and Patient connected to face mask oxygen  Post-op Assessment: Report given to RN and Post -op Vital signs reviewed and stable  Post vital signs: Reviewed and stable  Last Vitals:  Vitals Value Taken Time  BP    Temp    Pulse    Resp    SpO2      Last Pain:  Vitals:   03/13/20 0805  TempSrc: Temporal  PainSc: 0-No pain         Complications: No complications documented.

## 2020-03-13 NOTE — Anesthesia Postprocedure Evaluation (Signed)
Anesthesia Post Note  Patient: Cheryl Martin  Procedure(s) Performed: BOTOX INJECTION (N/A )  Patient location during evaluation: PACU Anesthesia Type: General Level of consciousness: awake and alert Pain management: pain level controlled Vital Signs Assessment: post-procedure vital signs reviewed and stable Respiratory status: spontaneous breathing, nonlabored ventilation, respiratory function stable and patient connected to nasal cannula oxygen Cardiovascular status: blood pressure returned to baseline and stable Postop Assessment: no apparent nausea or vomiting Anesthetic complications: no   No complications documented.   Last Vitals:  Vitals:   03/13/20 1010 03/13/20 1014  BP: 108/61 (!) 133/54  Pulse: 67 61  Resp: 15 20  Temp:  (!) 36 C  SpO2: 98% 99%    Last Pain:  Vitals:   03/13/20 1014  TempSrc: Temporal  PainSc: 0-No pain                 Martha Clan

## 2020-03-13 NOTE — Discharge Instructions (Signed)
AMBULATORY SURGERY  DISCHARGE INSTRUCTIONS   1) The drugs that you were given will stay in your system until tomorrow so for the next 24 hours you should not:  A) Drive an automobile B) Make any legal decisions C) Drink any alcoholic beverage   2) You may resume regular meals tomorrow.  Today it is better to start with liquids and gradually work up to solid foods.  You may eat anything you prefer, but it is better to start with liquids, then soup and crackers, and gradually work up to solid foods.   3) Please notify your doctor immediately if you have any unusual bleeding, trouble breathing, redness and pain at the surgery site, drainage, fever, or pain not relieved by medication.    4) Additional Ins  Office will call with your follow up appt, Patient may resume Aspirin today       Please contact your physician with any problems or Same Day Surgery at [phone], Monday through Friday 6 am to 4 pm, or [place] at [facility] number at [phone].

## 2020-03-13 NOTE — Anesthesia Preprocedure Evaluation (Signed)
Anesthesia Evaluation  Patient identified by MRN, date of birth, ID band Patient awake    Reviewed: Allergy & Precautions, H&P , NPO status , Patient's Chart, lab work & pertinent test results, reviewed documented beta blocker date and time   History of Anesthesia Complications (+) PONV, PROLONGED EMERGENCE and history of anesthetic complications  Airway Mallampati: II  TM Distance: >3 FB Neck ROM: full    Dental  (+) Dental Advidsory Given, Caps, Partial Upper, Partial Lower, Missing, Teeth Intact   Pulmonary neg pulmonary ROS, former smoker,    Pulmonary exam normal breath sounds clear to auscultation       Cardiovascular Exercise Tolerance: Good (-) hypertension(-) angina+ CAD  (-) Past MI and (-) Cardiac Stents Normal cardiovascular exam+ dysrhythmias (-) Valvular Problems/Murmurs Rhythm:regular Rate:Normal     Neuro/Psych neg Seizures TIA Neuromuscular disease negative psych ROS   GI/Hepatic Neg liver ROS, hiatal hernia, neg GERD  ,  Endo/Other  neg diabetesHypothyroidism   Renal/GU negative Renal ROS  negative genitourinary   Musculoskeletal   Abdominal   Peds  Hematology negative hematology ROS (+)   Anesthesia Other Findings Past Medical History: No date: ADD (attention deficit disorder) No date: Allergy No date: Anemia     Comment:  vit b12 and vit d deficiencies No date: Arthritis     Comment:  osteoarthritis No date: Bronchitis No date: Complication of anesthesia     Comment:  slow to wake up after colonoscopy No date: Coronary artery disease No date: DDD (degenerative disc disease), lumbar No date: Dysrhythmia     Comment:  atrial fibrillation No date: Edema No date: Hearing loss No date: History of hiatal hernia     Comment:  hx of No date: Hyperlipidemia No date: Hypothyroidism     Comment:  hx of as child and during pregnancy No date: Neuromuscular disorder ([place])     Comment:  pt unsure  of what this is. No date: Nocturia 02/2020: Pneumonia     Comment:  frequent bouts of bronchitis/pneumonia. has zithromax to              take at start of it. No date: PONV (postoperative nausea and vomiting) 12/2018: Stroke ([place])     Comment:  tia. no residual No date: Thyroid disease   Reproductive/Obstetrics negative OB ROS                             Anesthesia Physical Anesthesia Plan  ASA: II  Anesthesia Plan: General   Post-op Pain Management:    Induction: Intravenous  PONV Risk Score and Plan: 4 or greater and Propofol infusion and TIVA  Airway Management Planned: Natural Airway and Simple Face Mask  Additional Equipment:   Intra-op Plan:   Post-operative Plan:   Informed Consent: I have reviewed the patients History and Physical, chart, labs and discussed the procedure including the risks, benefits and alternatives for the proposed anesthesia with the patient or authorized representative who has indicated his/her understanding and acceptance.     Dental Advisory Given  Plan Discussed with: Anesthesiologist, CRNA and Surgeon  Anesthesia Plan Comments:         Anesthesia Quick Evaluation

## 2020-03-13 NOTE — Interval H&P Note (Signed)
History and Physical Interval Note:  03/13/2020 8:40 AM  Cheryl Martin  has presented today for surgery, with the diagnosis of overactive bladder, urge incontinence.  The various methods of treatment have been discussed with the patient and family. After consideration of risks, benefits and other options for treatment, the patient has consented to  Procedure(s): BOTOX INJECTION (N/A) as a surgical intervention.  The patient's history has been reviewed, patient examined, no change in status, stable for surgery.  I have reviewed the patient's chart and labs.  Questions were answered to the patient's satisfaction.    RRR CTAB  [name]

## 2020-03-14 ENCOUNTER — Encounter: Payer: Self-pay | Admitting: Urology

## 2020-03-17 ENCOUNTER — Ambulatory Visit: Payer: Self-pay

## 2020-03-20 DIAGNOSIS — D485 Neoplasm of uncertain behavior of skin: Secondary | ICD-10-CM | POA: Diagnosis not present

## 2020-03-20 DIAGNOSIS — L814 Other melanin hyperpigmentation: Secondary | ICD-10-CM | POA: Diagnosis not present

## 2020-03-20 DIAGNOSIS — L438 Other lichen planus: Secondary | ICD-10-CM | POA: Diagnosis not present

## 2020-03-20 DIAGNOSIS — L821 Other seborrheic keratosis: Secondary | ICD-10-CM | POA: Diagnosis not present

## 2020-03-23 ENCOUNTER — Ambulatory Visit: Payer: Medicare HMO | Admitting: Internal Medicine

## 2020-03-23 ENCOUNTER — Ambulatory Visit: Payer: Self-pay | Admitting: Physician Assistant

## 2020-04-12 ENCOUNTER — Ambulatory Visit: Payer: Medicare HMO | Admitting: Urology
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
## 2020-04-19 ENCOUNTER — Other Ambulatory Visit: Payer: Self-pay

## 2020-04-19 ENCOUNTER — Ambulatory Visit (INDEPENDENT_AMBULATORY_CARE_PROVIDER_SITE_OTHER): Payer: Medicare HMO | Admitting: Urology

## 2020-04-19 VITALS — BP 125/77 | HR 73 | Ht 63.0 in | Wt 135.0 lb

## 2020-04-19 DIAGNOSIS — N3281 Overactive bladder: Secondary | ICD-10-CM

## 2020-04-19 LAB — BLADDER SCAN AMB NON-IMAGING: Scan Result: 96

## 2020-04-27 DIAGNOSIS — H18593 Other hereditary corneal dystrophies, bilateral: Secondary | ICD-10-CM | POA: Diagnosis not present

## 2020-04-27 DIAGNOSIS — Z961 Presence of intraocular lens: Secondary | ICD-10-CM | POA: Diagnosis not present

## 2020-04-27 DIAGNOSIS — D3132 Benign neoplasm of left choroid: Secondary | ICD-10-CM | POA: Diagnosis not present

## 2020-04-27 DIAGNOSIS — H35372 Puckering of macula, left eye: Secondary | ICD-10-CM | POA: Diagnosis not present

## 2020-04-27 DIAGNOSIS — H04123 Dry eye syndrome of bilateral lacrimal glands: Secondary | ICD-10-CM | POA: Diagnosis not present

## 2020-05-02 ENCOUNTER — Ambulatory Visit: Payer: Medicare HMO | Admitting: Internal Medicine

## 2020-05-15 ENCOUNTER — Ambulatory Visit: Payer: Medicare HMO | Admitting: Internal Medicine

## 2020-06-04 IMAGING — CT CT CHEST W/O CM
1 series · 15 of 34 positions shown, 19 images · non-contrast
Comparison: CT scan of August 20, 2017.

CLINICAL DATA: Lung nodule.

EXAM:
CT CHEST WITHOUT CONTRAST
TECHNIQUE: Multidetector CT imaging of the chest was performed following the
standard protocol without IV contrast.

[Series 2: chest w/(date) · axial · 0.71mm/px · z∈[-306,-42]mm · 15 of 156 slices shown, 19 images]
[im 12/156  mediastinal]
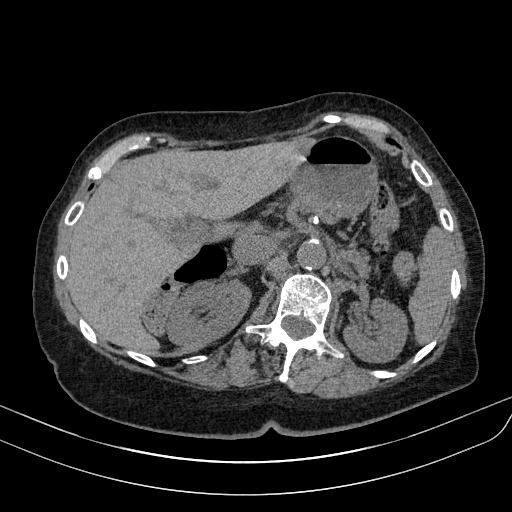
[im 12/156  lung]
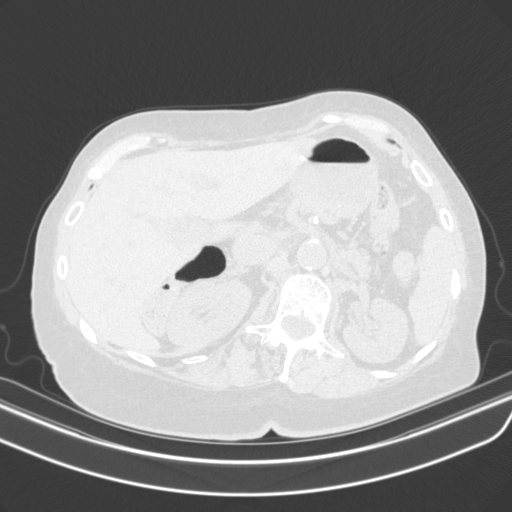
[im 23/156  lung]
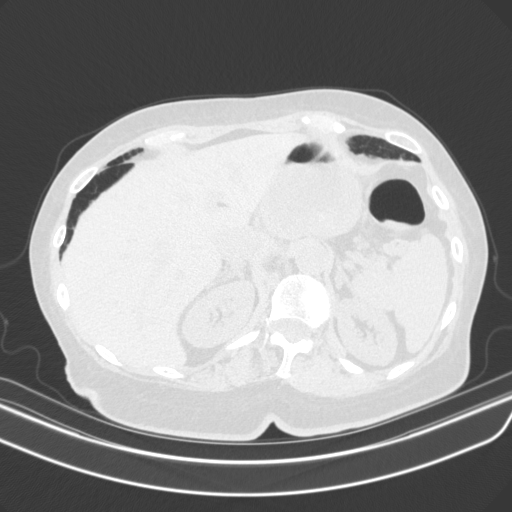
[im 32/156  lung]
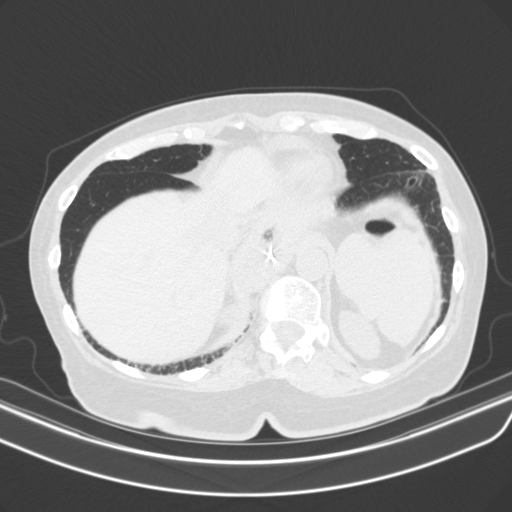
[im 41/156  lung]
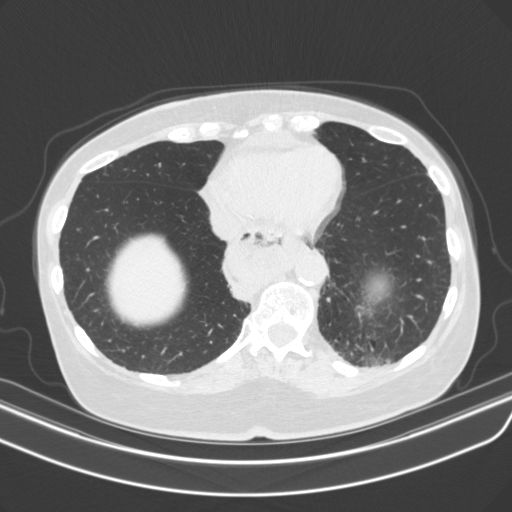
[im 52/156  mediastinal]
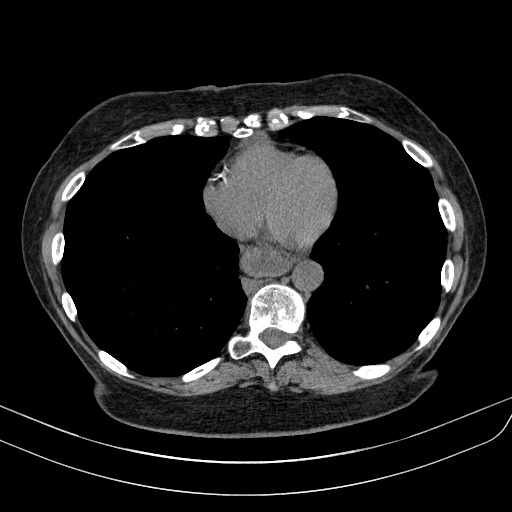
[im 52/156  lung]
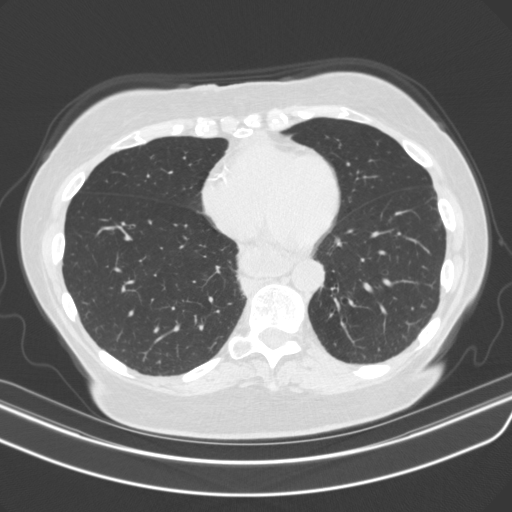
[im 63/156  lung]
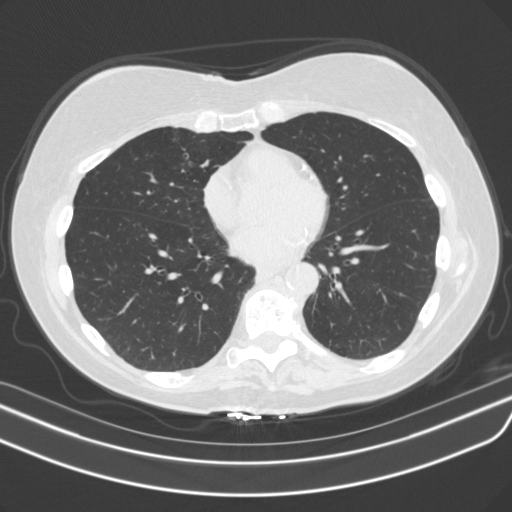
[im 69/156  lung]
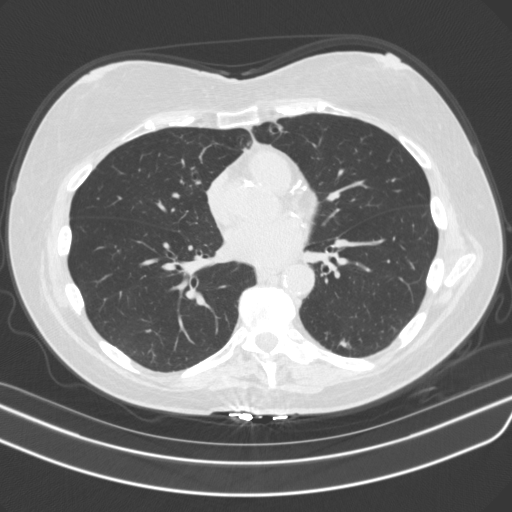
[im 81/156  lung]
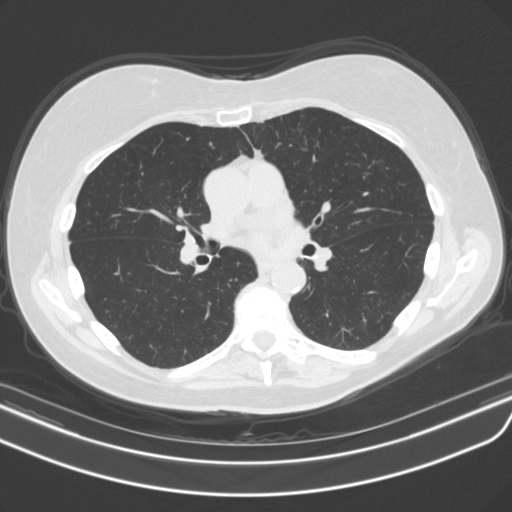
[im 87/156  mediastinal]
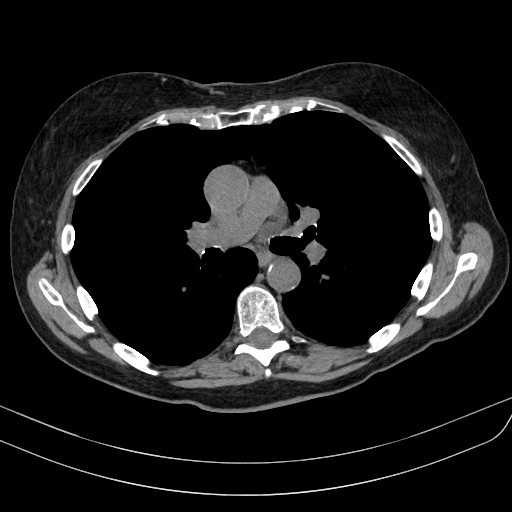
[im 87/156  lung]
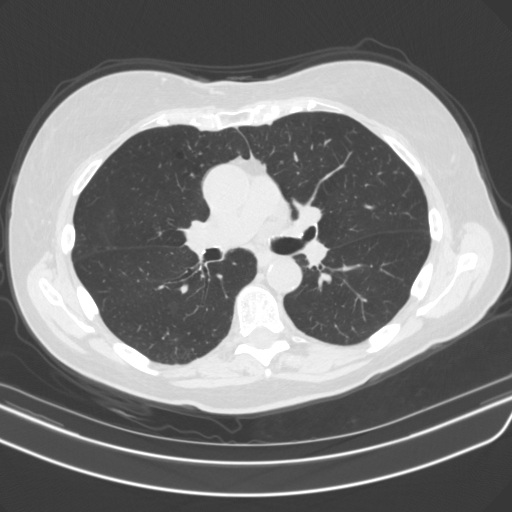
[im 94/156  lung]
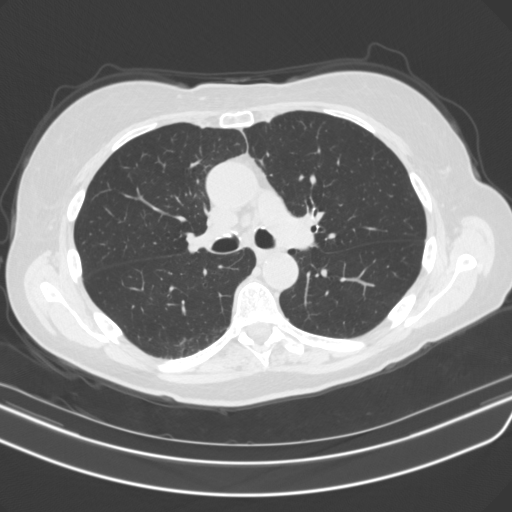
[im 104/156  lung]
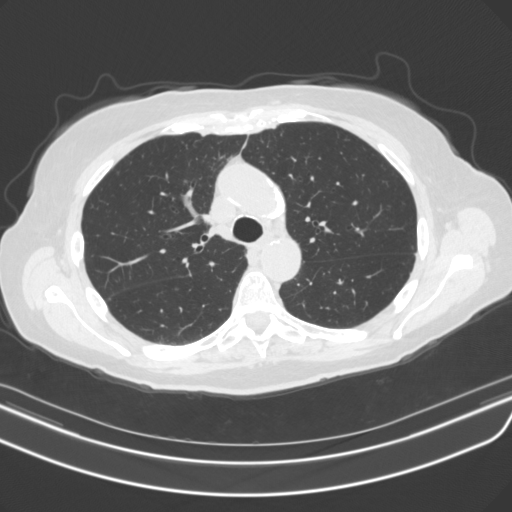
[im 115/156  lung]
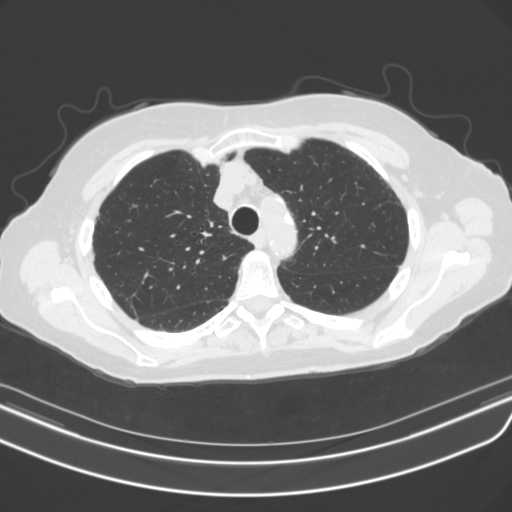
[im 125/156  mediastinal]
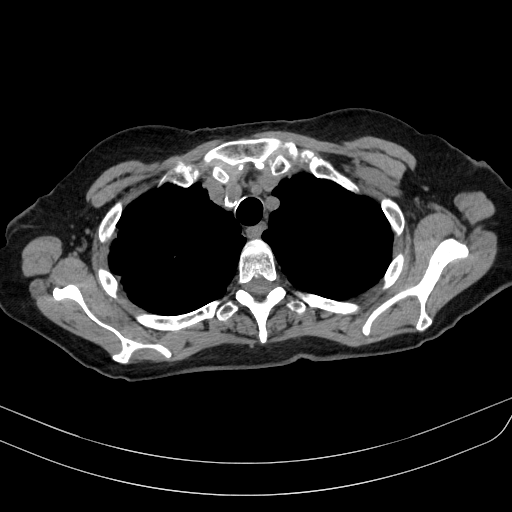
[im 125/156  lung]
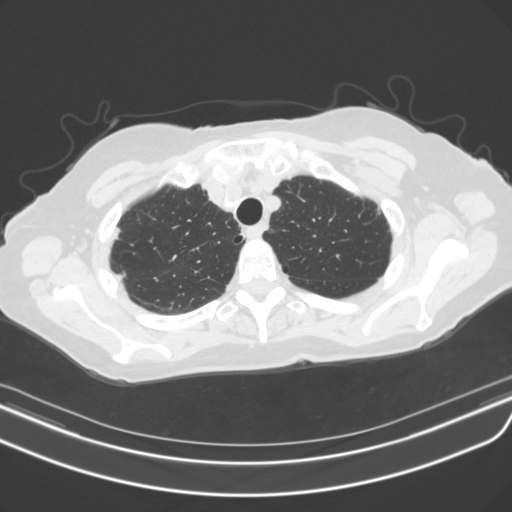
[im 133/156  lung]
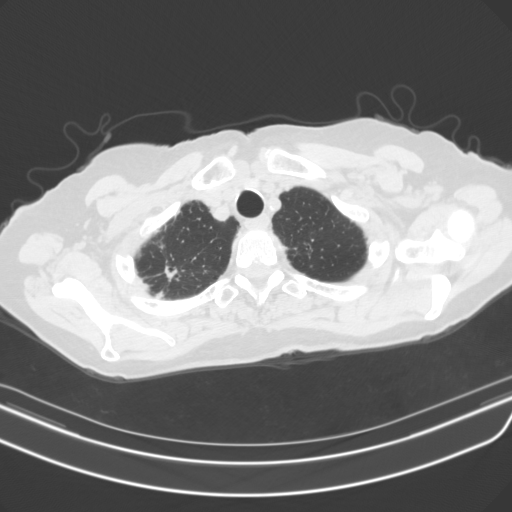
[im 144/156  lung]
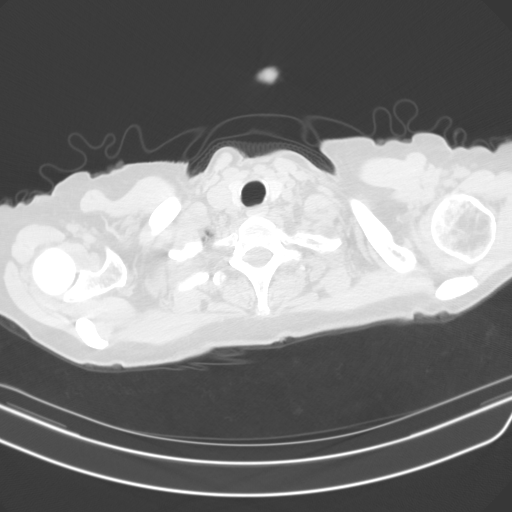

[15 of 34 positions shown; findings below may reference images not displayed]

FINDINGS: Cardiovascular: Atherosclerosis of thoracic aorta is noted without
aneurysm formation. Normal cardiac size. No pericardial effusion.
Coronary artery calcifications are noted.

Mediastinum/Nodes: Stable large sliding-type hiatal hernia is noted.
No adenopathy is noted. Thyroid gland is unremarkable.

Lungs/Pleura: No pneumothorax or pleural effusion is noted. Stable
biapical scarring is noted. Stable 8 mm nodule is noted in right
lung apex most likely related to scarring. Stable 9 mm subpleural
nodule is noted in right middle lobe. Stable 6 mm nodule is seen in
left lower lobe.

Upper Abdomen: No acute abnormality.

Musculoskeletal: No chest wall mass or suspicious bone lesions
identified.
IMPRESSION: Stable 9 mm subpleural nodule seen in right middle lobe as well as
stable 6 mm nodule seen in left lower lobe. Stable 8 mm nodule is
noted in right lung apex most likely related to scarring. Follow-up
unenhanced chest CT in 12 months is recommended to ensure stability
of these abnormalities.

Stable large sliding-type hiatal hernia.

Coronary artery calcifications are noted.

Aortic Atherosclerosis ([id number]-K5L.L).

## 2020-06-13 ENCOUNTER — Ambulatory Visit: Payer: Medicare HMO | Admitting: Internal Medicine

## 2020-06-13 ENCOUNTER — Encounter: Payer: Self-pay | Admitting: Internal Medicine

## 2020-06-13 ENCOUNTER — Other Ambulatory Visit: Payer: Self-pay

## 2020-06-13 ENCOUNTER — Ambulatory Visit (INDEPENDENT_AMBULATORY_CARE_PROVIDER_SITE_OTHER): Payer: Medicare HMO

## 2020-06-13 DIAGNOSIS — R918 Other nonspecific abnormal finding of lung field: Secondary | ICD-10-CM

## 2020-06-13 DIAGNOSIS — J479 Bronchiectasis, uncomplicated: Secondary | ICD-10-CM | POA: Diagnosis not present

## 2020-06-13 MED ORDER — AZITHROMYCIN 250 MG PO TABS
250.0000 mg | ORAL_TABLET | ORAL | 11 refills | Status: DC
Start: 2020-06-13 — End: 2020-09-17

## 2020-06-13 NOTE — Patient Instructions (Signed)
No change medications  Please remember to go to the  x-ray department  for your tests - we will call you with the results when they are available    Please schedule a follow up visit in 6 months but call sooner if needed

## 2020-06-13 NOTE — Progress Notes (Signed)
Cheryl Martin, female    DOB: [birth year],    MRN: [id number]   Brief patient profile:  83  yowf MM/quit smoking 1975 moved from [place] [address] with recurrent episodes of  bronchitis variable times every since arrival in [place] with some seasonal rhinitis esp in Spring and abnormal cxr since around 2017 with new dx of bronchiectasis by CT chest  01/12/2019 so referred to pulmonary clinic 03/02/2019 by Dr   Raliegh Scarlet     History of Present Illness  03/02/2019  Pulmonary/ 1st office eval/Cheryl Martin  Chief Complaint  Patient presents with  . Pulmonary Consult    Referred by Dr Raliegh Scarlet for bronchiectasis.    Dyspnea:  Can walk 25 min flat around a track each lap takes a min avg pace -faster most people her age Cough: just with flares / really not significant production chronically, just during flares of what she's been calling bronchitis which is only once a year at most.  Sleep: nasal congestion but doesn't keep her up, one pillow  SABA use: none New abd pain x 4 months no relation with meals  rec GERD diet  Bronchiectasis =    Whenever you develop cough congestion take mucinex or mucinex dm up to 1200 mg every 12 hours as needed        04/15/2019  f/u ov/Cheryl Martin re: bronchiectasis  Chief Complaint  Patient presents with  . Follow-up    Breathing is doing well. She has occ throat clearing.   Dyspnea:  Walking track > no change in ex tol  Cough: none x with flares , last jan/feb 2020  Sleeping: some nasal congestion / doesn't really keep her up  SABA use: none  02: none  rec In the event of a flare of cough with excess or nasty mucus > refillable zpak  pfts on return >nl     06/13/2020  f/u ov/Cheryl Martin re:  Bronchiectasis  On prn zpak  Chief Complaint  Patient presents with  . Follow-up    Breathing is "pretty good". She c/o hoarseness and occ has to clear her throat.   Dyspnea:  Not limited by breathing from desired activities  / singing in choir again   Cough: no nasty mucus, last zpak 3 m  ago  Sleeping: no resp symptoms/ bed is flat SABA use: no  02: no   No obvious day to day or daytime variability or assoc excess/ purulent sputum or mucus plugs or hemoptysis or cp or chest tightness, subjective wheeze or overt sinus or hb symptoms.   97 without nocturnal  or early am exacerbation  of respiratory  c/o's or need for noct saba. Also denies any obvious fluctuation of symptoms with weather or environmental changes or other aggravating or alleviating factors except as outlined above   No unusual exposure hx or h/o childhood pna/ asthma or knowledge of premature birth.  Current Allergies, Complete Past Medical History, Past Surgical History, Family History, and Social History were reviewed in [employer] record.  ROS  The following are not active complaints unless bolded Hoarseness, sore throat, dysphagia, dental problems, itching, sneezing,  nasal congestion or discharge of excess mucus or purulent secretions, ear ache,   fever, chills, sweats, unintended wt loss or wt gain, classically pleuritic or exertional cp,  orthopnea pnd or arm/hand swelling  or leg swelling, presyncope, palpitations, abdominal pain, anorexia, nausea, vomiting, diarrhea  or change in bowel habits or change in bladder habits, change in stools or change in urine,  dysuria, hematuria,  rash, arthralgias, visual complaints, headache, numbness, weakness or ataxia or problems with walking or coordination,  change in mood or  memory.        Current Meds  Medication Sig  . acetaminophen (TYLENOL) 500 MG tablet Take 1,000 mg by mouth every 6 (six) hours as needed for moderate pain or headache.  Marland Kitchen aspirin EC 81 MG tablet Take 1 tablet (81 mg total) by mouth daily.  Marland Kitchen Bioflavonoid Products (VITAMIN C) CHEW Chew 1 tablet by mouth daily.  Marland Kitchen BIOTIN PO Take 1 tablet by mouth daily.  . Cholecalciferol (VITAMIN D-3 PO) Take 1 capsule by mouth daily with breakfast.  . Cyanocobalamin (B-12) 1000 MCG  SUBL Place 2,000 mcg under the tongue daily.  . diclofenac Sodium (VOLTAREN) 1 % GEL Apply 1 application topically 4 (four) times daily as needed (pain).  Marland Kitchen docusate sodium (COLACE) 100 MG capsule Take 100 mg by mouth every other day. Alternate taking miralax one day and colace the next  . Lactobacillus (PROBIOTIC ACIDOPHILUS) CAPS Take 1 capsule by mouth daily. Takes after breakfast  . Melatonin 3 MG CAPS Take 3 mg by mouth at bedtime as needed (sleep).  . Multiple Vitamin (MULTIVITAMIN WITH MINERALS) TABS tablet Take 1 tablet by mouth daily.  . polyethylene glycol (MIRALAX / GLYCOLAX) 17 g packet Take 17 g by mouth every other day. Alternate taking miralax one day and colace the next  . rosuvastatin (CRESTOR) 10 MG tablet TAKE 1 TABLET BY MOUTH EVERYDAY AT BEDTIME (Patient taking differently: Take 10 mg by mouth at bedtime. )  . Sodium Chloride, Hypertonic, (MURO 128 OP) Place 1 drop into both eyes See admin instructions. Instill 1 drop into each eye in the morning, then as needed daily for dryness/irritation  . Xylitol (XYLIMELTS MT) Use as directed 1 tablet in the mouth or throat daily as needed (dry mouth).                          Past Medical History:  Diagnosis Date  . ADD (attention deficit disorder)   . Allergy   . Anemia    as a teenager  . Arthritis   . Complication of anesthesia    slow to wake up after colonoscopy  . Edema   . Hearing loss   . History of hiatal hernia    hx of  . Hypothyroidism    hx of as child and during pregnancy  . Neuromuscular disorder ([place])   . Nocturia   . Pneumonia    hx of after bronchitis  . PONV (postoperative nausea and vomiting)   . Thyroid disease   Hands feet swelling     Objective:     Vital signs reviewed  06/13/2020  - Note at rest 02 sats  97% on RA      06/13/2020     136  09/21/2019         140  04/15/19 138 lb (62.6 kg)  04/08/19 137 lb (62.1 kg)  03/15/19 136 lb (61.7 kg)     pleasant min hoarse amb thin wf  nad    HEENT : pt wearing mask not removed for exam due to covid -19 concerns.    NECK :  without JVD/Nodes/TM/ nl carotid upstrokes bilaterally   LUNGS: no acc muscle use,  Nl contour chest which is clear to A and P bilaterally without cough on insp or exp maneuvers   CV:  RRR  no s3 or murmur or increase in P2, and no edema   ABD:  soft and nontender with nl inspiratory excursion in the supine position. No bruits or organomegaly appreciated, bowel sounds nl  MS:  Nl gait/ ext warm without deformities, calf tenderness, cyanosis or clubbing No obvious joint restrictions   SKIN: warm and dry without lesions    NEURO:  alert, approp, nl sensorium with  no motor or cerebellar deficits apparent.    CXR PA and Lateral:   06/13/2020 :    I personally reviewed images and  impression as follows:   Minimally increased markings/ no acute changes             Assessment

## 2020-06-14 ENCOUNTER — Encounter: Payer: Self-pay | Admitting: Internal Medicine

## 2020-06-14 NOTE — Assessment & Plan Note (Signed)
See CT chest 01/12/2019 - Quant Gold TB  03/02/2019  Neg  - cxr 06/13/2020 no "macroscopic change"   Although there are clearly abnormalities on CT scan, they should probably be considered "microscopic" since not obvious on plain cxr .     In the setting of obvious "macroscopic" health issues,  I am very reluctatnt to embark on an invasive w/u at this point but will arrange consevative  follow up and in the meantime see what we can do to address the patient's subjective concerns.     Discussed in detail all the  indications, usual  risks and alternatives  relative to the benefits with patient who agrees to proceed with conservative f/u as outlined            Each maintenance medication was reviewed in detail including emphasizing most importantly the difference between maintenance and prns and under what circumstances the prns are to be triggered using an action plan format where appropriate.  Total time for H and P, chart review, counseling, and generating customized AVS unique to this office visit / charting = 15 min      .

## 2020-06-14 NOTE — Assessment & Plan Note (Signed)
Onset of ? around 2017  - see CT chest 01/12/2019 - Labs ordered 03/02/2019     alpha one AT phenotype  MM level 147 - Allergy profile 03/02/19 >  Eos 0.1  /  IgE  13 RAST neg  - Ig profile: 03/02/2019   Minimal decrease IgM and IgG - 04/15/2019 added prn cycles of zpak for flares  - PFT's  09/21/2019  FEV1 2.22 (128 % ) ratio 0.75  p 4 % improvement from saba p nothing prior to study    Adequate control on present rx, reviewed in detail with pt > no change in rx needed  = prn zpak,no indication for bronchodilators

## 2020-06-19 ENCOUNTER — Other Ambulatory Visit: Payer: Self-pay

## 2020-06-19 ENCOUNTER — Ambulatory Visit
Admission: RE | Admit: 2020-06-19 | Discharge: 2020-06-19 | Disposition: A | Discharge status: Home / Self Care | Payer: Medicare HMO | Source: Ambulatory Visit | Attending: Physician Assistant | Admitting: Physician Assistant

## 2020-06-19 DIAGNOSIS — M810 Age-related osteoporosis without current pathological fracture: Secondary | ICD-10-CM | POA: Diagnosis not present

## 2020-06-19 DIAGNOSIS — E785 Hyperlipidemia, unspecified: Secondary | ICD-10-CM

## 2020-06-19 DIAGNOSIS — Z0000 Encounter for general adult medical examination without abnormal findings: Secondary | ICD-10-CM

## 2020-06-19 DIAGNOSIS — Z780 Asymptomatic menopausal state: Secondary | ICD-10-CM

## 2020-06-20 ENCOUNTER — Encounter: Payer: Self-pay | Admitting: Physician Assistant

## 2020-06-20 ENCOUNTER — Other Ambulatory Visit: Payer: Self-pay | Admitting: Physician Assistant

## 2020-06-20 ENCOUNTER — Ambulatory Visit (HOSPITAL_COMMUNITY): Payer: Medicare HMO

## 2020-06-20 ENCOUNTER — Telehealth: Payer: Self-pay | Admitting: Physician Assistant

## 2020-06-20 ENCOUNTER — Ambulatory Visit (INDEPENDENT_AMBULATORY_CARE_PROVIDER_SITE_OTHER): Payer: Medicare HMO | Admitting: Physician Assistant

## 2020-06-20 VITALS — BP 123/68 | HR 61 | Temp 98.1°F | Ht 63.0 in | Wt 134.1 lb

## 2020-06-20 DIAGNOSIS — R270 Ataxia, unspecified: Secondary | ICD-10-CM | POA: Diagnosis not present

## 2020-06-20 DIAGNOSIS — G44319 Acute post-traumatic headache, not intractable: Secondary | ICD-10-CM | POA: Diagnosis not present

## 2020-06-20 DIAGNOSIS — W19XXXA Unspecified fall, initial encounter: Secondary | ICD-10-CM

## 2020-06-20 NOTE — Telephone Encounter (Signed)
Called patient to give results from dexa scan and patient advised that she has a place on the back of her head that is painful and swollen.   Patient then states that she fell in her yard recently from a standing position and hit her chin on the ground. I advised patient that she should go to ER for evaluation and be sure she doesn't have a concussion. Patient declined going to ER and states that the place on her head has been there for "a while" and that she doesn't think its related to the fall.  We had an opening on the schedule today and she is coming in for evaluation in our office at 1045am. Patient was advised that she should call EMS for dizziness, confusion, blackout, etc.
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
## 2020-06-21 ENCOUNTER — Other Ambulatory Visit: Payer: Self-pay

## 2020-06-21 ENCOUNTER — Ambulatory Visit
Admission: RE | Admit: 2020-06-21 | Discharge: 2020-06-21 | Disposition: A | Discharge status: Home / Self Care | Payer: Medicare HMO | Source: Ambulatory Visit | Attending: Physician Assistant | Admitting: Physician Assistant

## 2020-06-21 DIAGNOSIS — R270 Ataxia, unspecified: Secondary | ICD-10-CM

## 2020-06-21 DIAGNOSIS — S0990XA Unspecified injury of head, initial encounter: Secondary | ICD-10-CM | POA: Diagnosis not present

## 2020-06-21 DIAGNOSIS — W19XXXA Unspecified fall, initial encounter: Secondary | ICD-10-CM

## 2020-06-21 DIAGNOSIS — G319 Degenerative disease of nervous system, unspecified: Secondary | ICD-10-CM | POA: Diagnosis not present

## 2020-06-21 DIAGNOSIS — I618 Other nontraumatic intracerebral hemorrhage: Secondary | ICD-10-CM | POA: Diagnosis not present

## 2020-06-21 DIAGNOSIS — G44319 Acute post-traumatic headache, not intractable: Secondary | ICD-10-CM

## 2020-06-21 MED ORDER — GADOBENATE DIMEGLUMINE 529 MG/ML IV SOLN
10.0000 mL | Freq: Once | INTRAVENOUS | Status: AC | PRN
  Administered 2020-06-21: 10 mL via INTRAVENOUS

## 2020-06-22 ENCOUNTER — Ambulatory Visit: Payer: Medicare HMO | Admitting: Dermatology

## 2020-07-04 ENCOUNTER — Other Ambulatory Visit: Payer: Self-pay

## 2020-07-04 ENCOUNTER — Ambulatory Visit (INDEPENDENT_AMBULATORY_CARE_PROVIDER_SITE_OTHER): Payer: Medicare HMO | Admitting: Physician Assistant

## 2020-07-04 ENCOUNTER — Encounter: Payer: Self-pay | Admitting: Physician Assistant

## 2020-07-04 VITALS — BP 108/58 | HR 71 | Ht 63.0 in | Wt 144.2 lb

## 2020-07-04 DIAGNOSIS — W19XXXD Unspecified fall, subsequent encounter: Secondary | ICD-10-CM | POA: Diagnosis not present

## 2020-07-04 DIAGNOSIS — M810 Age-related osteoporosis without current pathological fracture: Secondary | ICD-10-CM | POA: Diagnosis not present

## 2020-07-04 NOTE — Patient Instructions (Signed)
Osteoporosis  Osteoporosis happens when your bones get thin and weak. This can cause your bones to break (fracture) more easily. You can do things at home to make your bones stronger. Follow these instructions at home:  Activity  Exercise as told by your doctor. Ask your doctor what activities are safe for you. You should do: ? Exercises that make your muscles work to hold your body weight up (weight-bearing exercises). These include tai chi, yoga, and walking. ? Exercises to make your muscles stronger. One example is lifting weights. Lifestyle  Limit alcohol intake to no more than 1 drink a day for nonpregnant women and 2 drinks a day for men. One drink equals 12 oz of beer, 5 oz of wine, or 1 oz of hard liquor.  Do not use any products that have nicotine or tobacco in them. These include cigarettes and e-cigarettes. If you need help quitting, ask your doctor. Preventing falls  Use tools to help you move around (mobility aids) as needed. These include canes, walkers, scooters, and crutches.  Keep rooms well-lit and free of clutter.  Put away things that could make you trip. These include cords and rugs.  Install safety rails on stairs. Install grab bars in bathrooms.  Use rubber mats in slippery areas, like bathrooms.  Wear shoes that: ? Fit you well. ? Support your feet. ? Have closed toes. ? Have rubber soles or low heels.  Tell your doctor about all of the medicines you are taking. Some medicines can make you more likely to fall. General instructions  Eat plenty of calcium and vitamin D. These nutrients are good for your bones. Good sources of calcium and vitamin D include: ? Some fatty fish, such as salmon and tuna. ? Foods that have calcium and vitamin D added to them (fortified foods). For example, some breakfast cereals are fortified with calcium and vitamin D. ? Egg yolks. ? Cheese. ? Liver.  Take over-the-counter and prescription medicines only as told by your  doctor.  Keep all follow-up visits as told by your doctor. This is important. Contact a doctor if:  You have not been tested (screened) for osteoporosis and you are: ? A woman who is age 18 or older. ? A man who is age 46 or older. Get help right away if:  You fall.  You get hurt. Summary  Osteoporosis happens when your bones get thin and weak.  Weak bones can break (fracture) more easily.  Eat plenty of calcium and vitamin D. These nutrients are good for your bones.  Tell your doctor about all of the medicines that you take. This information is not intended to replace advice given to you by your health care provider. Make sure you discuss any questions you have with your health care provider. Document Revised: 06/20/2017 Document Reviewed: 05/02/2017 Elsevier Patient Education  [place].   Alendronate weekly tablets What is this medicine? ALENDRONATE (a LEN droe nate) slows calcium loss from bones. It helps to make healthy bone and to slow bone loss in people with osteoporosis. It may be used to treat Paget's disease. This medicine may be used for other purposes; ask your health care provider or pharmacist if you have questions. COMMON BRAND NAME(S): Fosamax What should I tell my health care provider before I take this medicine? They need to know if you have any of these conditions:  esophagus, stomach, or intestine problems, like acid-reflux or GERD  dental disease  kidney disease  low blood calcium  low vitamin D  problems swallowing  problems sitting or standing for 30 minutes  an unusual or allergic reaction to alendronate, other medicines, foods, dyes, or preservatives  pregnant or trying to get pregnant  breast-feeding How should I use this medicine? You must take this medicine exactly as directed or you will lower the amount of medicine you absorb into your body or you may cause yourself harm. Take your dose by mouth first thing in the morning,  after you are up for the day. Do not eat or drink anything before you take this medicine. Swallow your medicine with a full glass (6 to 8 fluid ounces) of plain water. Do not take this tablet with any other drink. Do not chew or crush the tablet. After taking this medicine, do not eat breakfast, drink, or take any medicines or vitamins for at least 30 minutes. Stand or sit up for at least 30 minutes after you take this medicine; do not lie down. Take this medicine on the same day every week. Do not take your medicine more often than directed. Talk to your pediatrician regarding the use of this medicine in children. Special care may be needed. Overdosage: If you think you have taken too much of this medicine contact a poison control center or emergency room at once. NOTE: This medicine is only for you. Do not share this medicine with others. What if I miss a dose? If you miss a dose, take the dose on the morning after you remember. Then take your next dose on your regular day of the week. Never take 2 tablets on the same day. Do not take double or extra doses. What may interact with this medicine?  aluminum hydroxide  antacids  aspirin  calcium supplements  drugs for inflammation like ibuprofen, naproxen, and others  iron supplements  magnesium supplements  vitamins with minerals This list may not describe all possible interactions. Give your health care provider a list of all the medicines, herbs, non-prescription drugs, or dietary supplements you use. Also tell them if you smoke, drink alcohol, or use illegal drugs. Some items may interact with your medicine. What should I watch for while using this medicine? Visit your doctor or health care professional for regular checks ups. It may be some time before you see benefit from this medicine. Do not stop taking your medication except on your doctor's advice. Your doctor or health care professional may order blood tests and other tests to see  how you are doing. You should make sure you get enough calcium and vitamin D while you are taking this medicine, unless your doctor tells you not to. Discuss the foods you eat and the vitamins you take with your health care professional. Some people who take this medicine have severe bone, joint, and/or muscle pain. This medicine may also increase your risk for a broken thigh bone. Tell your doctor right away if you have pain in your upper leg or groin. Tell your doctor if you have any pain that does not go away or that gets worse. This medicine can make you more sensitive to the sun. If you get a rash while taking this medicine, sunlight may cause the rash to get worse. Keep out of the sun. If you cannot avoid being in the sun, wear protective clothing and use sunscreen. Do not use sun lamps or tanning beds/booths. What side effects may I notice from receiving this medicine? Side effects that you should report to your doctor or  health care professional as soon as possible:  allergic reactions like skin rash, itching or hives, swelling of the face, lips, or tongue  black or tarry stools  bone, muscle or joint pain  changes in vision  chest pain  heartburn or stomach pain  jaw pain, especially after dental work  pain or trouble when swallowing  redness, blistering, peeling or loosening of the skin, including inside the mouth Side effects that usually do not require medical attention (report to your doctor or health care professional if they continue or are bothersome):  changes in taste  diarrhea or constipation  eye pain or itching  headache  nausea or vomiting  stomach gas or fullness This list may not describe all possible side effects. Call your doctor for medical advice about side effects. You may report side effects to FDA at 1-800-FDA-1088. Where should I keep my medicine? Keep out of the reach of children. Store at room temperature of 15 and 30 degrees C (59 and 86  degrees F). Throw away any unused medicine after the expiration date. NOTE: This sheet is a summary. It may not cover all possible information. If you have questions about this medicine, talk to your doctor, pharmacist, or health care provider.  2020 Elsevier/Gold Standard (2011-05-23 09:02:42)   Concussion, Adult  A concussion is a brain injury from a hard, direct hit (trauma) to your head or body. This direct hit causes the brain to quickly shake back and forth inside the skull. A concussion may also be called a mild traumatic brain injury (TBI). Healing from this injury can take time. What are the causes? This condition is caused by:  A direct hit to your head, such as: ? Running into a player during a game. ? Being hit in a fight. ? Hitting your head on a hard surface.  A quick and sudden movement (jolt) of the head or neck, such as in a car crash. What are the signs or symptoms? The signs of a concussion can be hard to notice. They may be missed by you, family members, and doctors. You may look fine on the outside but may not act or feel normal. Physical symptoms  Headaches.  Being tired (fatigued).  Being dizzy.  Problems with body balance.  Problems seeing or hearing.  Being sensitive to light or noise.  Feeling sick to your stomach (nausea) or throwing up (vomiting).  Not sleeping or eating as you used to.  Loss of feeling (numbness) or tingling in the body.  Seizure. Mental and emotional symptoms  Problems remembering things.  Trouble focusing your mind (concentrating), organizing, or making decisions.  Being slow to think, act, react, speak, or read.  Feeling grouchy (irritable).  Having mood changes.  Feeling worried or nervous (anxious).  Feeling sad (depressed). How is this treated? This condition may be treated by:  Stopping sports or activity if you are injured. If you hit your head or have signs of concussion: ? Do not return to sports or  activities the same day. ? Get checked by a doctor before you return to your activities.  Resting your body and your mind.  Being watched carefully, often at home.  Medicines to help with symptoms such as: ? Feeling sick to your stomach. ? Headaches. ? Problems with sleep.  Avoid taking strong pain medicines (opioids) for a concussion.  Avoiding alcohol and drugs.  Being asked to go to a concussion clinic or a place to help you recover (rehabilitation center). Recovery  from a concussion can take time. Return to activities only:  When you are fully healed.  When your doctor says it is safe. Follow these instructions at home: Activity  Limit activities that need a lot of thought or focus, such as: ? Homework or work for your job. ? Watching TV. ? Using the computer or phone. ? Playing memory games and puzzles.  Rest. Rest helps your brain heal. Make sure you: ? Get plenty of sleep. Most adults should get 7-9 hours of sleep each night. ? Rest during the day. Take naps or breaks when you feel tired.  Avoid activity like exercise until your doctor says its safe. Stop any activity that makes symptoms worse.  Do not do activities that could cause a second concussion, such as riding a bike or playing sports.  Ask your doctor when you can return to your normal activities, such as school, work, sports, and driving. Your ability to react may be slower. Do not do these activities if you are dizzy. General instructions   Take over-the-counter and prescription medicines only as told by your doctor.  Do not drink alcohol until your doctor says you can.  Watch your symptoms and tell other people to do the same. Other problems can occur after a concussion. Older adults have a higher risk of serious problems.  Tell your work Freight forwarder, teachers, Government social research officer, school counselor, coach, or Product/process development scientist about your injury and symptoms. Tell them about what you can or cannot do.  Keep  all follow-up visits as told by your doctor. This is important. How is this prevented?  It is very important that you do not get another brain injury. In rare cases, another injury can cause brain damage that will not go away, brain swelling, or death. The risk of this is greatest in the first 7-10 days after a head injury. To avoid injuries: ? Stop activities that could lead to a second concussion, such as contact sports, until your doctor says it is okay. ? When you return to sports or activities:  Do not crash into other players. This is how most concussions happen.  Follow the rules.  Respect other players. ? Get regular exercise. Do strength and balance training. ? Wear a helmet that fits you well during sports, biking, or other activities.  Helmets can help protect you from serious skull and brain injuries, but they do not protect you from a concussion. Even when wearing a helmet, you should avoid being hit in the head. Contact a doctor if:  Your symptoms get worse or they do not get better.  You have new symptoms.  You have another injury. Get help right away if:  You have bad headaches or your headaches get worse.  You feel weak or numb in any part of your body.  You are mixed up (confused).  Your balance gets worse.  You keep throwing up.  You feel more sleepy than normal.  Your speech is not clear (is slurred).  You cannot recognize people or places.  You have a seizure.  Others have trouble waking you up.  You have behavior changes.  You have changes in how you see (vision).  You pass out (lose consciousness). Summary  A concussion is a brain injury from a hard, direct hit (trauma) to your head or body.  This condition is treated with rest and careful watching of symptoms.  If you keep having symptoms, call your doctor. This information is not intended to replace  advice given to you by your health care provider. Make sure you discuss any questions  you have with your health care provider. Document Revised: 02/26/2018 Document Reviewed: 02/26/2018 Elsevier Patient Education  [place].
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
## 2020-07-27 ENCOUNTER — Encounter: Payer: Self-pay | Admitting: Physician Assistant

## 2020-07-27 ENCOUNTER — Ambulatory Visit (INDEPENDENT_AMBULATORY_CARE_PROVIDER_SITE_OTHER): Payer: Medicare HMO | Admitting: Physician Assistant

## 2020-07-27 ENCOUNTER — Other Ambulatory Visit: Payer: Self-pay

## 2020-07-27 VITALS — BP 117/65 | HR 69 | Ht 63.0 in | Wt 138.4 lb

## 2020-07-27 DIAGNOSIS — E785 Hyperlipidemia, unspecified: Secondary | ICD-10-CM | POA: Diagnosis not present

## 2020-07-27 DIAGNOSIS — Z0000 Encounter for general adult medical examination without abnormal findings: Secondary | ICD-10-CM | POA: Diagnosis not present

## 2020-07-27 DIAGNOSIS — J479 Bronchiectasis, uncomplicated: Secondary | ICD-10-CM

## 2020-07-27 DIAGNOSIS — I700 Atherosclerosis of aorta: Secondary | ICD-10-CM

## 2020-07-27 DIAGNOSIS — R519 Headache, unspecified: Secondary | ICD-10-CM
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
## 2020-07-27 NOTE — Assessment & Plan Note (Signed)
-  Followed by Pulmonology. 

## 2020-07-27 NOTE — Patient Instructions (Signed)

## 2020-07-27 NOTE — Assessment & Plan Note (Signed)
-  Last lipid panel: Total cholesterol 139, triglycerides 86, HDL 61, LDL 62 (at goal) -Discussed with patient risk versus benefit of statin therapy and recommend to continue current medication, especially with CAD. Recommend to discuss with cardiologist possibly decreasing Crestor to 5 mg, however, if LDL does not remain at goal then recommend to resume Crestor 10 mg. Patient verbalized understanding.  -Continue with a heart healthy diet. Continue to stay as active as possible. -Will repeat lipid panel and hepatic function today.

## 2020-07-28 LAB — COMPREHENSIVE METABOLIC PANEL
ALT: 13 IU/L (ref 0–32)
AST: 16 IU/L (ref 0–40)
Albumin/Globulin Ratio: 2.5 — ABNORMAL HIGH (ref 1.2–2.2)
Albumin: 4.3 g/dL (ref 3.6–4.6)
Alkaline Phosphatase: 93 IU/L (ref 44–121)
BUN/Creatinine Ratio: 18 (ref 12–28)
BUN: 13 mg/dL (ref 8–27)
Bilirubin Total: 0.4 mg/dL (ref 0.0–1.2)
CO2: 26 mmol/L (ref 20–29)
Calcium: 9.9 mg/dL (ref 8.7–10.3)
Chloride: 102 mmol/L (ref 96–106)
Creatinine, Ser: 0.73 mg/dL (ref 0.57–1.00)
GFR calc Af Amer: 86 mL/min/{1.73_m2} (ref >59)
GFR calc non Af Amer: 74 mL/min/{1.73_m2} (ref >59)
Globulin, Total: 1.7 g/dL (ref 1.5–4.5)
Glucose: 93 mg/dL (ref 65–99)
Potassium: 4.3 mmol/L (ref 3.5–5.2)
Sodium: 139 mmol/L (ref 134–144)
Total Protein: 6 g/dL (ref 6.0–8.5)

## 2020-07-28 LAB — CBC WITH DIFFERENTIAL/PLATELET
Basophils Absolute: 0 10*3/uL (ref 0.0–0.2)
Basos: 1 %
EOS (ABSOLUTE): 0.1 10*3/uL (ref 0.0–0.4)
Eos: 2 %
Hematocrit: 38.6 % (ref 34.0–46.6)
Hemoglobin: 13.1 g/dL (ref 11.1–15.9)
Immature Grans (Abs): 0 10*3/uL (ref 0.0–0.1)
Immature Granulocytes: 0 %
Lymphocytes Absolute: 1.1 10*3/uL (ref 0.7–3.1)
Lymphs: 20 %
MCH: 31.6 pg (ref 26.6–33.0)
MCHC: 33.9 g/dL (ref 31.5–35.7)
MCV: 93 fL (ref 79–97)
Monocytes Absolute: 0.5 10*3/uL (ref 0.1–0.9)
Monocytes: 9 %
Neutrophils Absolute: 3.8 10*3/uL (ref 1.4–7.0)
Neutrophils: 68 %
Platelets: 215 10*3/uL (ref 150–450)
RBC: 4.14 x10E6/uL (ref 3.77–5.28)
RDW: 11.9 % (ref 11.7–15.4)
WBC: 5.5 10*3/uL (ref 3.4–10.8)

## 2020-07-28 LAB — LIPID PANEL
Chol/HDL Ratio: 2.3 ratio (ref 0.0–4.4)
Cholesterol, Total: 163 mg/dL (ref 100–199)
HDL: 71 mg/dL (ref >39)
LDL Chol Calc (NIH): 67 mg/dL (ref 0–99)
Triglycerides: 149 mg/dL (ref 0–149)
VLDL Cholesterol Cal: 25 mg/dL (ref 5–40)

## 2020-08-15 ENCOUNTER — Emergency Department (HOSPITAL_BASED_OUTPATIENT_CLINIC_OR_DEPARTMENT_OTHER)
Admission: EM | Admit: 2020-08-15 | Discharge: 2020-08-15 | Disposition: A | Discharge status: Home / Self Care | Payer: Medicare HMO | Attending: Emergency Medicine | Admitting: Emergency Medicine

## 2020-08-15 ENCOUNTER — Other Ambulatory Visit: Payer: Self-pay

## 2020-08-15 ENCOUNTER — Telehealth: Payer: Self-pay | Admitting: Physician Assistant

## 2020-08-15 ENCOUNTER — Encounter (HOSPITAL_BASED_OUTPATIENT_CLINIC_OR_DEPARTMENT_OTHER): Payer: Self-pay | Admitting: Emergency Medicine

## 2020-08-15 ENCOUNTER — Emergency Department (HOSPITAL_BASED_OUTPATIENT_CLINIC_OR_DEPARTMENT_OTHER): Payer: Medicare HMO

## 2020-08-15 DIAGNOSIS — Z96652 Presence of left artificial knee joint: Secondary | ICD-10-CM | POA: Insufficient documentation

## 2020-08-15 DIAGNOSIS — I700 Atherosclerosis of aorta: Secondary | ICD-10-CM | POA: Insufficient documentation

## 2020-08-15 DIAGNOSIS — E039 Hypothyroidism, unspecified: Secondary | ICD-10-CM | POA: Diagnosis not present

## 2020-08-15 DIAGNOSIS — K449 Diaphragmatic hernia without obstruction or gangrene: Secondary | ICD-10-CM | POA: Insufficient documentation

## 2020-08-15 DIAGNOSIS — M47816 Spondylosis without myelopathy or radiculopathy, lumbar region: Secondary | ICD-10-CM | POA: Diagnosis not present

## 2020-08-15 DIAGNOSIS — M5459 Other low back pain: Secondary | ICD-10-CM | POA: Diagnosis not present

## 2020-08-15 DIAGNOSIS — K5730 Diverticulosis of large intestine without perforation or abscess without bleeding: Secondary | ICD-10-CM | POA: Insufficient documentation

## 2020-08-15 DIAGNOSIS — Z7982 Long term (current) use of aspirin: Secondary | ICD-10-CM | POA: Diagnosis not present

## 2020-08-15 DIAGNOSIS — M5126 Other intervertebral disc displacement, lumbar region: Secondary | ICD-10-CM | POA: Diagnosis not present

## 2020-08-15 DIAGNOSIS — M545 Low back pain, unspecified: Secondary | ICD-10-CM | POA: Diagnosis present

## 2020-08-15 DIAGNOSIS — M4316 Spondylolisthesis, lumbar region: Secondary | ICD-10-CM | POA: Diagnosis not present

## 2020-08-15 DIAGNOSIS — M5136 Other intervertebral disc degeneration, lumbar region: Secondary | ICD-10-CM | POA: Diagnosis not present

## 2020-08-15 DIAGNOSIS — Z87891 Personal history of nicotine dependence: Secondary | ICD-10-CM | POA: Diagnosis not present

## 2020-08-15 DIAGNOSIS — G9520 Unspecified cord compression: Secondary | ICD-10-CM | POA: Diagnosis not present

## 2020-08-15 DIAGNOSIS — R9431 Abnormal electrocardiogram [ECG] [EKG]: Secondary | ICD-10-CM | POA: Diagnosis not present

## 2020-08-15 DIAGNOSIS — M549 Dorsalgia, unspecified: Secondary | ICD-10-CM

## 2020-08-15 LAB — CBC WITH DIFFERENTIAL/PLATELET
Abs Immature Granulocytes: 0.01 10*3/uL (ref 0.00–0.07)
Basophils Absolute: 0.1 10*3/uL (ref 0.0–0.1)
Basophils Relative: 1 %
Eosinophils Absolute: 0.2 10*3/uL (ref 0.0–0.5)
Eosinophils Relative: 4 %
HCT: 40.2 % (ref 36.0–46.0)
Hemoglobin: 13.8 g/dL (ref 12.0–15.0)
Immature Granulocytes: 0 %
Lymphocytes Relative: 29 %
Lymphs Abs: 1.3 10*3/uL (ref 0.7–4.0)
MCH: 31.5 pg (ref 26.0–34.0)
MCHC: 34.3 g/dL (ref 30.0–36.0)
MCV: 91.8 fL (ref 80.0–100.0)
Monocytes Absolute: 0.4 10*3/uL (ref 0.1–1.0)
Monocytes Relative: 9 %
Neutro Abs: 2.5 10*3/uL (ref 1.7–7.7)
Neutrophils Relative %: 57 %
Platelets: 215 10*3/uL (ref 150–400)
RBC: 4.38 MIL/uL (ref 3.87–5.11)
RDW: 11.9 % (ref 11.5–15.5)
WBC: 4.4 10*3/uL (ref 4.0–10.5)
nRBC: 0 % (ref 0.0–0.2)

## 2020-08-15 LAB — BASIC METABOLIC PANEL
Anion gap: 8 (ref 5–15)
BUN: 14 mg/dL (ref 8–23)
CO2: 25 mmol/L (ref 22–32)
Calcium: 9.3 mg/dL (ref 8.9–10.3)
Chloride: 100 mmol/L (ref 98–111)
Creatinine, Ser: 0.78 mg/dL (ref 0.44–1.00)
GFR, Estimated: >60 mL/min (ref >60)
Glucose, Bld: 86 mg/dL (ref 70–99)
Potassium: 4.1 mmol/L (ref 3.5–5.1)
Sodium: 133 mmol/L — ABNORMAL LOW (ref 135–145)

## 2020-08-15 LAB — URINALYSIS, ROUTINE W REFLEX MICROSCOPIC
Bilirubin Urine: NEGATIVE
Glucose, UA: NEGATIVE mg/dL
Hgb urine dipstick: NEGATIVE
Ketones, ur: NEGATIVE mg/dL
Leukocytes,Ua: NEGATIVE
Nitrite: NEGATIVE
Protein, ur: NEGATIVE mg/dL
Specific Gravity, Urine: 1.01 (ref 1.005–1.030)
pH: 7.5 (ref 5.0–8.0)

## 2020-08-15 MED ORDER — IBUPROFEN 400 MG PO TABS
400.0000 mg | ORAL_TABLET | Freq: Once | ORAL | Status: AC | PRN
  Administered 2020-08-15: 400 mg via ORAL
  Filled 2020-08-15: qty 1

## 2020-08-15 MED ORDER — LIDOCAINE 5 % EX PTCH
1.0000 | MEDICATED_PATCH | Freq: Once | CUTANEOUS | Status: DC
  Administered 2020-08-15: 1 via TRANSDERMAL
  Filled 2020-08-15: qty 1

## 2020-08-15 MED ORDER — BACLOFEN 5 MG PO TABS: 5.0000 mg | ORAL_TABLET | Freq: Two times a day (BID) | ORAL | 0 refills | Status: DC | PRN

## 2020-08-15 MED ORDER — METHOCARBAMOL 500 MG PO TABS: 500.0000 mg | ORAL_TABLET | Freq: Two times a day (BID) | ORAL | 0 refills | Status: DC

## 2020-08-15 MED ORDER — METHOCARBAMOL 500 MG PO TABS
500.0000 mg | ORAL_TABLET | Freq: Once | ORAL | Status: AC
  Administered 2020-08-15: 500 mg via ORAL
  Filled 2020-08-15: qty 1

## 2020-08-15 NOTE — Discharge Instructions (Signed)
You were seen here today for Back Pain: Low back pain is discomfort in the lower back that may be due to injuries to muscles and ligaments around the spine. Occasionally, it may be caused by a problem to a part of the spine called a disc. Your back pain should be treated with medicines listed below as well as back exercises and this back pain should get better over the next 2 weeks. Most patients get completely well in 4 weeks. It is important to know however, if you develop severe or worsening pain, low back pain with fever, numbness, weakness or inability to walk or urinate, you should return to the ER immediately.  Please follow up with your doctor this week for a recheck if still having symptoms.  HOME INSTRUCTIONS Self - care:  The application of heat can help soothe the pain.  Maintaining your daily activities, including walking (this is encouraged), as it will help you get better faster than just staying in bed. Do not life, push, pull anything more than 10 pounds for the next week. I am attaching back exercises that you can do at home to help facilitate your recovery.   Back Exercises - I have attached a handout on back exercises that can be done at home to help facilitate your recovery.   Medications are also useful to help with pain control.   Acetaminophen.  This medication is generally safe, and found over the counter. Take as directed for your age. You should not take more than 8 of the extra strength (500mg ) pills a day (max dose is 4000mg  total OVER one day)  Non steroidal anti inflammatory: This includes medications including Ibuprofen, naproxen and Mobic; These medications help both pain and swelling and are very useful in treating back pain.  They should be taken with food, as they can cause stomach upset, and more seriously, stomach bleeding. Do not combine the medications.   Lidocaine Patch: Salon Pas lidocaine patches (blue and silver box) can be purchased over the counter and worn  for 12 hours for local pain relief   Muscle relaxants:  These medications can help with muscle tightness that is a cause of lower back pain.  Most of these medications can cause drowsiness, and it is not safe to drive or use dangerous machinery while taking them. They are primarily helpful when taken at night before sleep.  You will need to follow up with your primary healthcare provider in 1-2 weeks for reassessment and persistent symptoms.  Be aware that if you develop new symptoms, such as a fever, leg weakness, difficulty with or loss of control of your urine or bowels, abdominal pain, or more severe pain, you will need to seek medical attention and/or return to the Emergency department. Additional Information:  Your vital signs today were: BP (!) 156/87    Pulse 67    Temp 97.6 F (36.4 C) (Oral)    Resp 16    Ht 5\' 3"  (1.6 m)    Wt 61.2 kg    SpO2 99%    BMI 23.91 kg/m  If your blood pressure (BP) was elevated above 135/85 this visit, please have this repeated by your doctor within one month. ---------------

## 2020-08-15 NOTE — ED Notes (Signed)
Patients daughter notified this RN that her pain is increasing as the ibuprofen she took earlier this morning is worn off. Pt brought back to triage room to assess. States back is spasming up. Given ibuprofen as she states it helped earlier. Also provided with heating pad. Pt appreciative, informed that she will be going back to a room soon.

## 2020-08-15 NOTE — ED Notes (Signed)
EKG handed to [place]

## 2020-08-15 NOTE — Addendum Note (Signed)
Addended by: Mickel Crow on: 08/15/2020 09:21 AM   Modules accepted: Orders

## 2020-08-15 NOTE — ED Triage Notes (Signed)
Back spasms started yesterday ,  Hurts to take a deep a deep breath, states only shoveled snow a few days ago but no injury

## 2020-08-15 NOTE — Telephone Encounter (Signed)
Patients daughter called office to cancel apt schedule for severe back pain. Baclofen was sent to pharmacy to help patient make it to apt for tomorrow since patient declined to go to ED for evaluation. Daughter then called office to cancel apt and told Ben at front desk they were going to [facility] for treatment today bc they could not wait until tomorrow. I then asked Suezanne Jacquet to advise Arbie Cookey we would cancel Baclofen and let the provider that they will see in ED treat patient. AS, CMA

## 2020-08-15 NOTE — ED Notes (Signed)
PT ambulated to stock room from [facility] and had steady gait with no assistance.

## 2020-08-15 NOTE — Telephone Encounter (Signed)
Patient's daughter advised that prescription Baclofen was going to be cancelled since appointment was cancelled and they would need to get that from [facility] when they are seen.
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
## 2020-08-15 NOTE — Telephone Encounter (Signed)
Spoke with Arbie Cookey who is on pt DPR. She states mother is in extreme lower back pain and cant get comfortable. They called EMS and were advised by EMS not to go to ED due to wait times.   I spoke with Herb Grays who was agreeable to sending Baclofen 5mg  BID #10 no refills and advised for patient to use Diclofenac Gel OTC and for pt to schedule apt for tomorrow if they refuse to take her to ED today.    Daughter is aware of the above and scheduled apt for patient with Herb Grays tomorrow at 11am. AS, CMA

## 2020-08-15 NOTE — Telephone Encounter (Signed)
Patient started experiencing pain yesterday, some discomfort and got worse throughout the day. Patient was in more pain before bed and took ibuprofen and it helped her rest some. When patient got up this morning patient was in bad pain, hardly could walk, called 911 and paramedics came. They thought it was sciatica but it was a muscular spasm. Left side of back.Patient is still in pain and experiencing spams and put is using Biofreeze. Patient's daughter Cheryl Martin called. Thanks

## 2020-08-16 ENCOUNTER — Ambulatory Visit: Payer: Medicare HMO | Admitting: Physician Assistant

## 2020-09-13 ENCOUNTER — Other Ambulatory Visit: Payer: Self-pay

## 2020-09-13 ENCOUNTER — Encounter: Payer: Self-pay | Admitting: Physician Assistant

## 2020-09-13 ENCOUNTER — Ambulatory Visit (INDEPENDENT_AMBULATORY_CARE_PROVIDER_SITE_OTHER): Payer: Medicare HMO | Admitting: Physician Assistant

## 2020-09-13 VITALS — BP 114/65 | HR 70 | Temp 98.9°F | Ht 63.0 in | Wt 141.8 lb

## 2020-09-13 DIAGNOSIS — M545 Low back pain, unspecified: Secondary | ICD-10-CM | POA: Diagnosis not present
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
## 2020-09-13 NOTE — Patient Instructions (Signed)
Sciatica Rehab Ask your health care provider which exercises are safe for you. Do exercises exactly as told by your health care provider and adjust them as directed. It is normal to feel mild stretching, pulling, tightness, or discomfort as you do these exercises. Stop right away if you feel sudden pain or your pain gets worse. Do not begin these exercises until told by your health care provider. Stretching and range-of-motion exercises These exercises warm up your muscles and joints and improve the movement and flexibility of your hips and back. These exercises also help to relieve pain, numbness, and tingling. Sciatic nerve glide 1. Sit in a chair with your head facing down toward your chest. Place your hands behind your back. Let your shoulders slump forward. 2. Slowly straighten one of your legs while you tilt your head back as if you are looking toward the ceiling. Only straighten your leg as far as you can without making your symptoms worse. 3. Hold this position for __________ seconds. 4. Slowly return to the starting position. 5. Repeat with your other leg. Repeat __________ times. Complete this exercise __________ times a day. Knee to chest with hip adduction and internal rotation 1. Lie on your back on a firm surface with both legs straight. 2. Bend one of your knees and move it up toward your chest until you feel a gentle stretch in your lower back and buttock. Then, move your knee toward the shoulder that is on the opposite side from your leg. This is hip adduction and internal rotation. ? Hold your leg in this position by holding on to the front of your knee. 3. Hold this position for __________ seconds. 4. Slowly return to the starting position. 5. Repeat with your other leg. Repeat __________ times. Complete this exercise __________ times a day.   Prone extension on elbows 1. Lie on your abdomen on a firm surface. A bed may be too soft for this exercise. 2. Prop yourself up on  your elbows. 3. Use your arms to help lift your chest up until you feel a gentle stretch in your abdomen and your lower back. ? This will place some of your body weight on your elbows. If this is uncomfortable, try stacking pillows under your chest. ? Your hips should stay down, against the surface that you are lying on. Keep your hip and back muscles relaxed. 4. Hold this position for __________ seconds. 5. Slowly relax your upper body and return to the starting position. Repeat __________ times. Complete this exercise __________ times a day.   Strengthening exercises These exercises build strength and endurance in your back. Endurance is the ability to use your muscles for a long time, even after they get tired. Pelvic tilt This exercise strengthens the muscles that lie deep in the abdomen. 1. Lie on your back on a firm surface. Bend your knees and keep your feet flat on the floor. 2. Tense your abdominal muscles. Tip your pelvis up toward the ceiling and flatten your lower back into the floor. ? To help with this exercise, you may place a small towel under your lower back and try to push your back into the towel. 3. Hold this position for __________ seconds. 4. Let your muscles relax completely before you repeat this exercise. Repeat __________ times. Complete this exercise __________ times a day. Alternating arm and leg raises 1. Get on your hands and knees on a firm surface. If you are on a hard floor, you may want to  use padding, such as an exercise mat, to cushion your knees. 2. Line up your arms and legs. Your hands should be directly below your shoulders, and your knees should be directly below your hips. 3. Lift your left leg behind you. At the same time, raise your right arm and straighten it in front of you. ? Do not lift your leg higher than your hip. ? Do not lift your arm higher than your shoulder. ? Keep your abdominal and back muscles tight. ? Keep your hips facing the  ground. ? Do not arch your back. ? Keep your balance carefully, and do not hold your breath. 4. Hold this position for __________ seconds. 5. Slowly return to the starting position. 6. Repeat with your right leg and your left arm. Repeat __________ times. Complete this exercise __________ times a day.   Posture and body mechanics Good posture and healthy body mechanics can help to relieve stress in your body's tissues and joints. Body mechanics refers to the movements and positions of your body while you do your daily activities. Posture is part of body mechanics. Good posture means:  Your spine is in its natural S-curve position (neutral).  Your shoulders are pulled back slightly.  Your head is not tipped forward. Follow these guidelines to improve your posture and body mechanics in your everyday activities. Standing  When standing, keep your spine neutral and your feet about hip width apart. Keep a slight bend in your knees. Your ears, shoulders, and hips should line up.  When you do a task in which you stand in one place for a long time, place one foot up on a stable object that is 2-4 inches (5-10 cm) high, such as a footstool. This helps keep your spine neutral.   Sitting  When sitting, keep your spine neutral and keep your feet flat on the floor. Use a footrest, if necessary, and keep your thighs parallel to the floor. Avoid rounding your shoulders, and avoid tilting your head forward.  When working at a desk or a computer, keep your desk at a height where your hands are slightly lower than your elbows. Slide your chair under your desk so you are close enough to maintain good posture.  When working at a computer, place your monitor at a height where you are looking straight ahead and you do not have to tilt your head forward or downward to look at the screen.   Resting  When lying down and resting, avoid positions that are most painful for you.  If you have pain with activities  such as sitting, bending, stooping, or squatting, lie in a position in which your body does not bend very much. For example, avoid curling up on your side with your arms and knees near your chest (fetal position).  If you have pain with activities such as standing for a long time or reaching with your arms, lie with your spine in a neutral position and bend your knees slightly. Try the following positions: ? Lying on your side with a pillow between your knees. ? Lying on your back with a pillow under your knees. Lifting  When lifting objects, keep your feet at least shoulder width apart and tighten your abdominal muscles.  Bend your knees and hips and keep your spine neutral. It is important to lift using the strength of your legs, not your back. Do not lock your knees straight out.  Always ask for help to lift heavy or awkward objects.  This information is not intended to replace advice given to you by your health care provider. Make sure you discuss any questions you have with your health care provider. Document Revised: 10/30/2018 Document Reviewed: 07/30/2018 Elsevier Patient Education  2021 [employer]  

## 2020-09-17 ENCOUNTER — Encounter: Payer: Self-pay | Admitting: *Deleted

## 2020-09-17 ENCOUNTER — Ambulatory Visit (INDEPENDENT_AMBULATORY_CARE_PROVIDER_SITE_OTHER): Payer: Medicare HMO

## 2020-09-17 ENCOUNTER — Ambulatory Visit
Admission: EM | Admit: 2020-09-17 | Discharge: 2020-09-17 | Disposition: A | Discharge status: Home / Self Care | Payer: Medicare HMO | Attending: Emergency Medicine | Admitting: Emergency Medicine

## 2020-09-17 ENCOUNTER — Other Ambulatory Visit: Payer: Self-pay

## 2020-09-17 DIAGNOSIS — T07XXXA Unspecified multiple injuries, initial encounter: Secondary | ICD-10-CM | POA: Diagnosis not present

## 2020-09-17 DIAGNOSIS — W19XXXA Unspecified fall, initial encounter: Secondary | ICD-10-CM

## 2020-09-17 DIAGNOSIS — M533 Sacrococcygeal disorders, not elsewhere classified: Secondary | ICD-10-CM

## 2020-09-17 DIAGNOSIS — M545 Low back pain, unspecified: Secondary | ICD-10-CM | POA: Diagnosis not present

## 2020-09-17 DIAGNOSIS — M25551 Pain in right hip: Secondary | ICD-10-CM

## 2020-09-17 DIAGNOSIS — I878 Other specified disorders of veins: Secondary | ICD-10-CM | POA: Diagnosis not present

## 2020-09-17 DIAGNOSIS — M4186 Other forms of scoliosis, lumbar region: Secondary | ICD-10-CM | POA: Diagnosis not present

## 2020-09-17 DIAGNOSIS — M1611 Unilateral primary osteoarthritis, right hip: Secondary | ICD-10-CM | POA: Diagnosis not present

## 2020-09-17 HISTORY — DX: Muscle spasm of back: M62.830

## 2020-09-17 NOTE — Discharge Instructions (Addendum)
Your x-rays were negative for hip, pelvic, sacral, coccygeal fracture.  Heat or ice, whichever feels better.  Take 200 to 400 mg of ibuprofen combined with 500 mg of Tylenol 3-4 times a day.  Continue the Robaxin.

## 2020-09-17 NOTE — ED Triage Notes (Signed)
Pt reports fall occurring 2 days ago; states went to step up "and I didn't step up far enough", causing her to fall over onto the floor. Denies taking blood thinners. Denies head injury or LOC.  C/O neck pain, right low back pain extending into right buttock, left forearm soreness, left knee soreness and right shin pain.  Left upper arm tender to palpation.  C/O various areas of ecchymosis.
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
## 2020-09-19 ENCOUNTER — Ambulatory Visit: Payer: Self-pay | Admitting: Physician Assistant

## 2020-09-26 ENCOUNTER — Other Ambulatory Visit: Payer: Self-pay

## 2020-09-26 ENCOUNTER — Encounter: Payer: Self-pay | Admitting: Physician Assistant

## 2020-09-26 ENCOUNTER — Ambulatory Visit: Payer: Medicare HMO | Admitting: Physician Assistant

## 2020-09-26 VITALS — BP 138/78 | HR 83 | Ht 63.0 in | Wt 135.0 lb

## 2020-09-26 DIAGNOSIS — N3281 Overactive bladder: Secondary | ICD-10-CM

## 2020-09-26 LAB — BLADDER SCAN AMB NON-IMAGING
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
## 2020-09-27 LAB — URINALYSIS, COMPLETE
Bilirubin, UA: NEGATIVE
Glucose, UA: NEGATIVE
Ketones, UA: NEGATIVE
Leukocytes,UA: NEGATIVE
Nitrite, UA: NEGATIVE
Protein,UA: NEGATIVE
Specific Gravity, UA: 1.02 (ref 1.005–1.030)
Urobilinogen, Ur: 0.2 mg/dL (ref 0.2–1.0)
pH, UA: 5.5 (ref 5.0–7.5)

## 2020-09-27 LAB — MICROSCOPIC EXAMINATION
Bacteria, UA: NONE SEEN
WBC, UA: NONE SEEN /hpf (ref 0–5)

## 2020-09-29 LAB — CULTURE, URINE COMPREHENSIVE
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
## 2020-10-04 ENCOUNTER — Ambulatory Visit: Payer: Medicare HMO | Admitting: Gastroenterology

## 2020-10-04 ENCOUNTER — Ambulatory Visit: Payer: Medicare HMO | Admitting: Physician Assistant

## 2020-10-04 DIAGNOSIS — E785 Hyperlipidemia, unspecified: Secondary | ICD-10-CM

## 2020-10-04 DIAGNOSIS — I2510 Atherosclerotic heart disease of native coronary artery without angina pectoris: Secondary | ICD-10-CM

## 2020-10-22 ENCOUNTER — Other Ambulatory Visit: Payer: Self-pay | Admitting: Cardiology

## 2020-10-22 DIAGNOSIS — I2510 Atherosclerotic heart disease of native coronary artery without angina pectoris: Secondary | ICD-10-CM

## 2020-10-22 DIAGNOSIS — R072 Precordial pain: Secondary | ICD-10-CM

## 2020-10-22 DIAGNOSIS — I2584 Coronary atherosclerosis due to calcified coronary lesion: Secondary | ICD-10-CM

## 2020-10-22 DIAGNOSIS — R0789 Other chest pain: Secondary | ICD-10-CM

## 2020-10-22 DIAGNOSIS — E782 Mixed hyperlipidemia: Secondary | ICD-10-CM

## 2020-10-23 ENCOUNTER — Other Ambulatory Visit: Payer: Self-pay

## 2020-10-23 ENCOUNTER — Ambulatory Visit: Payer: Medicare HMO | Admitting: Nurse Practitioner

## 2020-10-23 ENCOUNTER — Other Ambulatory Visit: Payer: Self-pay | Admitting: Cardiology

## 2020-10-23 ENCOUNTER — Emergency Department (HOSPITAL_COMMUNITY)
Admission: EM | Admit: 2020-10-23 | Discharge: 2020-10-23 | Disposition: A | Discharge status: Home / Self Care | Payer: Medicare HMO | Attending: Emergency Medicine | Admitting: Emergency Medicine

## 2020-10-23 ENCOUNTER — Emergency Department (HOSPITAL_COMMUNITY): Payer: Medicare HMO

## 2020-10-23 ENCOUNTER — Telehealth: Payer: Self-pay | Admitting: Physician Assistant

## 2020-10-23 ENCOUNTER — Encounter (HOSPITAL_COMMUNITY): Payer: Self-pay

## 2020-10-23 DIAGNOSIS — I2510 Atherosclerotic heart disease of native coronary artery without angina pectoris: Secondary | ICD-10-CM | POA: Insufficient documentation

## 2020-10-23 DIAGNOSIS — I959 Hypotension, unspecified: Secondary | ICD-10-CM | POA: Diagnosis not present

## 2020-10-23 DIAGNOSIS — Z96652 Presence of left artificial knee joint: Secondary | ICD-10-CM | POA: Insufficient documentation

## 2020-10-23 DIAGNOSIS — R1012 Left upper quadrant pain: Secondary | ICD-10-CM

## 2020-10-23 DIAGNOSIS — R110 Nausea: Secondary | ICD-10-CM | POA: Diagnosis not present

## 2020-10-23 DIAGNOSIS — Z87891 Personal history of nicotine dependence: Secondary | ICD-10-CM | POA: Diagnosis not present

## 2020-10-23 DIAGNOSIS — Z7982 Long term (current) use of aspirin: Secondary | ICD-10-CM | POA: Insufficient documentation

## 2020-10-23 DIAGNOSIS — Z743 Need for continuous supervision: Secondary | ICD-10-CM | POA: Diagnosis not present

## 2020-10-23 DIAGNOSIS — K5900 Constipation, unspecified: Secondary | ICD-10-CM | POA: Diagnosis not present

## 2020-10-23 DIAGNOSIS — R109 Unspecified abdominal pain: Secondary | ICD-10-CM | POA: Diagnosis not present

## 2020-10-23 DIAGNOSIS — R0902 Hypoxemia: Secondary | ICD-10-CM | POA: Diagnosis not present

## 2020-10-23 DIAGNOSIS — E039 Hypothyroidism, unspecified: Secondary | ICD-10-CM | POA: Diagnosis not present

## 2020-10-23 DIAGNOSIS — I2584 Coronary atherosclerosis due to calcified coronary lesion: Secondary | ICD-10-CM

## 2020-10-23 DIAGNOSIS — R072 Precordial pain: Secondary | ICD-10-CM

## 2020-10-23 DIAGNOSIS — E782 Mixed hyperlipidemia: Secondary | ICD-10-CM

## 2020-10-23 DIAGNOSIS — R1084 Generalized abdominal pain: Secondary | ICD-10-CM | POA: Diagnosis not present

## 2020-10-23 DIAGNOSIS — R0789 Other chest pain: Secondary | ICD-10-CM

## 2020-10-23 LAB — CBC WITH DIFFERENTIAL/PLATELET
Abs Immature Granulocytes: 0.01 10*3/uL (ref 0.00–0.07)
Basophils Absolute: 0.1 10*3/uL (ref 0.0–0.1)
Basophils Relative: 1 %
Eosinophils Absolute: 0.2 10*3/uL (ref 0.0–0.5)
Eosinophils Relative: 3 %
HCT: 38.8 % (ref 36.0–46.0)
Hemoglobin: 12.9 g/dL (ref 12.0–15.0)
Immature Granulocytes: 0 %
Lymphocytes Relative: 25 %
Lymphs Abs: 1.4 10*3/uL (ref 0.7–4.0)
MCH: 31.7 pg (ref 26.0–34.0)
MCHC: 33.2 g/dL (ref 30.0–36.0)
MCV: 95.3 fL (ref 80.0–100.0)
Monocytes Absolute: 0.5 10*3/uL (ref 0.1–1.0)
Monocytes Relative: 10 %
Neutro Abs: 3.4 10*3/uL (ref 1.7–7.7)
Neutrophils Relative %: 61 %
Platelets: 187 10*3/uL (ref 150–400)
RBC: 4.07 MIL/uL (ref 3.87–5.11)
RDW: 12.4 % (ref 11.5–15.5)
WBC: 5.6 10*3/uL (ref 4.0–10.5)
nRBC: 0 % (ref 0.0–0.2)

## 2020-10-23 LAB — URINALYSIS, ROUTINE W REFLEX MICROSCOPIC
Bilirubin Urine: NEGATIVE
Glucose, UA: NEGATIVE mg/dL
Hgb urine dipstick: NEGATIVE
Ketones, ur: NEGATIVE mg/dL
Leukocytes,Ua: NEGATIVE
Nitrite: NEGATIVE
Protein, ur: NEGATIVE mg/dL
Specific Gravity, Urine: 1.015 (ref 1.005–1.030)
pH: 9 — ABNORMAL HIGH (ref 5.0–8.0)

## 2020-10-23 LAB — COMPREHENSIVE METABOLIC PANEL
ALT: 15 U/L (ref 0–44)
AST: 21 U/L (ref 15–41)
Albumin: 4 g/dL (ref 3.5–5.0)
Alkaline Phosphatase: 88 U/L (ref 38–126)
Anion gap: 7 (ref 5–15)
BUN: 16 mg/dL (ref 8–23)
CO2: 27 mmol/L (ref 22–32)
Calcium: 9.6 mg/dL (ref 8.9–10.3)
Chloride: 106 mmol/L (ref 98–111)
Creatinine, Ser: 0.82 mg/dL (ref 0.44–1.00)
GFR, Estimated: >60 mL/min (ref >60)
Glucose, Bld: 103 mg/dL — ABNORMAL HIGH (ref 70–99)
Potassium: 4 mmol/L (ref 3.5–5.1)
Sodium: 140 mmol/L (ref 135–145)
Total Bilirubin: 0.8 mg/dL (ref 0.3–1.2)
Total Protein: 6.4 g/dL — ABNORMAL LOW (ref 6.5–8.1)

## 2020-10-23 LAB — LIPASE, BLOOD: Lipase: 32 U/L (ref 11–51)

## 2020-10-23 MED ORDER — SODIUM CHLORIDE 0.9 % IV BOLUS
500.0000 mL | Freq: Once | INTRAVENOUS | Status: AC
  Administered 2020-10-23: 500 mL via INTRAVENOUS

## 2020-10-23 MED ORDER — FAMOTIDINE 20 MG PO TABS: 20.0000 mg | ORAL_TABLET | Freq: Two times a day (BID) | ORAL | 0 refills | Status: DC

## 2020-10-23 MED ORDER — IOHEXOL 300 MG/ML  SOLN
100.0000 mL | Freq: Once | INTRAMUSCULAR | Status: AC | PRN
  Administered 2020-10-23: 100 mL via INTRAVENOUS

## 2020-10-23 MED ORDER — ONDANSETRON HCL 4 MG PO TABS: 4.0000 mg | ORAL_TABLET | Freq: Four times a day (QID) | ORAL | 0 refills | Status: DC
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
## 2020-10-23 NOTE — Telephone Encounter (Signed)
Pt has had nausea for 2+ weeks now. No other symptoms. Pt advised to take a covid test at home and if negative we can schedule in office apt for evaluation. Pt added today at 3pm. AS, CMA

## 2020-10-23 NOTE — Discharge Instructions (Signed)
Please return for any problem.  °

## 2020-10-23 NOTE — Telephone Encounter (Signed)
Patient's daughter has no fever, no chills.Patient feels nauseous and feels like she is going to throw up, but has not yet. Please advise, thanks.

## 2020-10-23 NOTE — ED Triage Notes (Signed)
EMS reports from home, LUQ abdominal pain. Hx constipation, had bowel movement this morning and pain began after. Denies NVD.  BP 120/70 HR 90 RR 18 Sp02 98 RA
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
## 2020-10-23 NOTE — Telephone Encounter (Signed)
Patient's daughter called in and is on the way to the hospital. For documentation purposes only.

## 2020-10-25 ENCOUNTER — Other Ambulatory Visit: Payer: Self-pay

## 2020-10-25 ENCOUNTER — Ambulatory Visit: Payer: Medicare HMO | Admitting: Physician Assistant

## 2020-10-25 ENCOUNTER — Encounter: Payer: Self-pay | Admitting: Physician Assistant

## 2020-10-25 VITALS — BP 120/52 | HR 68 | Ht 63.0 in | Wt 137.2 lb

## 2020-10-25 DIAGNOSIS — E785 Hyperlipidemia, unspecified: Secondary | ICD-10-CM

## 2020-10-25 DIAGNOSIS — I2510 Atherosclerotic heart disease of native coronary artery without angina pectoris: Secondary | ICD-10-CM

## 2020-10-25 DIAGNOSIS — I2584 Coronary atherosclerosis due to calcified coronary lesion: Secondary | ICD-10-CM

## 2020-10-25 NOTE — Patient Instructions (Signed)
Medication Instructions:  Your physician recommends that you continue on your current medications as directed. Please refer to the Current Medication list given to you today.  *If you need a refill on your cardiac medications before your next appointment, please call your pharmacy*   Lab Work: none If you have labs (blood work) drawn today and your tests are completely normal, you will receive your results only by: Marland Kitchen MyChart Message (if you have MyChart) OR . A paper copy in the mail If you have any lab test that is abnormal or we need to change your treatment, we will call you to review the results.   Testing/Procedures: none   Follow-Up: At [facility], you and your health needs are our priority.  As part of our continuing mission to provide you with exceptional heart care, we have created designated Provider Care Teams.  These Care Teams include your primary Cardiologist (physician) and Advanced Practice Providers (APPs -  Physician Assistants and Nurse Practitioners) who all work together to provide you with the care you need, when you need it.  Your next appointment:    AS NEEDED  The format for your next appointment:   In Person

## 2020-11-07 ENCOUNTER — Other Ambulatory Visit: Payer: Self-pay

## 2020-11-07 ENCOUNTER — Encounter: Payer: Self-pay | Admitting: Physician Assistant

## 2020-11-07 ENCOUNTER — Ambulatory Visit: Payer: Medicare HMO | Admitting: Physician Assistant

## 2020-11-07 VITALS — BP 112/50 | HR 64 | Ht 63.0 in | Wt 137.4 lb

## 2020-11-07 DIAGNOSIS — K5909 Other constipation: Secondary | ICD-10-CM | POA: Diagnosis not present

## 2020-11-07 DIAGNOSIS — G8929 Other chronic pain: Secondary | ICD-10-CM

## 2020-11-07 DIAGNOSIS — R1012 Left upper quadrant pain: Secondary | ICD-10-CM | POA: Diagnosis not present
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
## 2020-11-07 NOTE — Patient Instructions (Signed)
If you are age 85 or older, your body mass index should be between 23-30. Your Body mass index is 24.34 kg/m. If this is out of the aforementioned range listed, please consider follow up with your Primary Care Provider.  If you are age 26 or younger, your body mass index should be between 19-25. Your Body mass index is 24.34 kg/m. If this is out of the aformentioned range listed, please consider follow up with your Primary Care Provider.   Anderson [place] recommends that you complete a bowel purge (to clean out your bowels). Please do the following: Purchase a bottle of Miralax over the counter as well as a box of 5 mg dulcolax tablets. Take 4 dulcolax tablets. Wait 1 hour. You will then drink 6-8 capfuls of Miralax mixed in an adequate amount of water/juice/gatorade (you may choose which of these liquids to drink) over the next 2-3 hours. You should expect results within 1 to 6 hours after completing the bowel purge.  Start Miralax twice daily after completing bowel purge.  Call and let us know how you are doing.  It was a pleasure to see you today!  Ellouise Newer, PA-C

## 2020-11-07 NOTE — Progress Notes (Signed)
Agree with assessment and plan as outlined.  

## 2020-11-08 ENCOUNTER — Telehealth: Payer: Self-pay | Admitting: *Deleted

## 2020-11-08 ENCOUNTER — Telehealth: Payer: Self-pay | Admitting: Physician Assistant

## 2020-11-08 DIAGNOSIS — N3941 Urge incontinence: Secondary | ICD-10-CM

## 2020-11-08 NOTE — Telephone Encounter (Signed)
Returned patient call and ler her know that she does not have to be fasting.Patient also asked if she needed to retake Ducolax tablets after taking 4 and medication only stayed down for 45 mins. I let her know to not take the Ducolax tablets again and she had already started the miralax.

## 2020-11-08 NOTE — Telephone Encounter (Signed)
Patient returned call-scheduled lab visit and Botox appointment. Voiced understanding.

## 2020-11-08 NOTE — Addendum Note (Signed)
Addended by: Verlene Mayer A on: 11/08/2020 04:46 PM   Modules accepted: Orders

## 2020-11-08 NOTE — Telephone Encounter (Signed)
Left VM to return call to setup botox. Patient will need a Lab visit for UA/Urine culture 10 days prior and Botox appointment.   Botox approved #[id number] Visits  11/08/20-11/08/21

## 2020-11-14 ENCOUNTER — Other Ambulatory Visit: Payer: Self-pay

## 2020-11-14 ENCOUNTER — Telehealth: Payer: Self-pay | Admitting: Physician Assistant

## 2020-11-14 ENCOUNTER — Other Ambulatory Visit: Payer: Medicare HMO

## 2020-11-14 DIAGNOSIS — N3941 Urge incontinence: Secondary | ICD-10-CM | POA: Diagnosis not present

## 2020-11-14 LAB — URINALYSIS, COMPLETE
Bilirubin, UA: NEGATIVE
Glucose, UA: NEGATIVE
Leukocytes,UA: NEGATIVE
Nitrite, UA: NEGATIVE
Protein,UA: NEGATIVE
RBC, UA: NEGATIVE
Specific Gravity, UA: 1.015 (ref 1.005–1.030)
Urobilinogen, Ur: 0.2 mg/dL (ref 0.2–1.0)
pH, UA: 6.5 (ref 5.0–7.5)

## 2020-11-14 LAB — MICROSCOPIC EXAMINATION
Bacteria, UA: NONE SEEN
Epithelial Cells (non renal): NONE SEEN /hpf (ref 0–10)

## 2020-11-14 NOTE — Telephone Encounter (Signed)
Patient scheduled.

## 2020-11-14 NOTE — Telephone Encounter (Signed)
Please contact and schedule patient for next available acute appointment.

## 2020-11-14 NOTE — Telephone Encounter (Signed)
Patient's daughter called in stating her headaches have gotten worse, really bad last night. It is the back of her head. Patient has an appointment in [place] today and daughter is driving her due to her headache as she does not feel comfortable diving. Patient states she has headaches all the time. Patient has been using an ice pack and tylenol. Please advise, thanks.

## 2020-11-17 ENCOUNTER — Ambulatory Visit (INDEPENDENT_AMBULATORY_CARE_PROVIDER_SITE_OTHER): Payer: Medicare HMO | Admitting: Nurse Practitioner

## 2020-11-17 ENCOUNTER — Encounter: Payer: Self-pay | Admitting: Nurse Practitioner

## 2020-11-17 ENCOUNTER — Other Ambulatory Visit: Payer: Self-pay

## 2020-11-17 VITALS — BP 130/71 | HR 63 | Temp 99.1°F | Ht 63.0 in | Wt 135.1 lb

## 2020-11-17 DIAGNOSIS — W19XXXD Unspecified fall, subsequent encounter: Secondary | ICD-10-CM

## 2020-11-17 DIAGNOSIS — H518 Other specified disorders of binocular movement: Secondary | ICD-10-CM

## 2020-11-17 DIAGNOSIS — G4452 New daily persistent headache (NDPH): Secondary | ICD-10-CM | POA: Diagnosis not present

## 2020-11-17 LAB — CULTURE, URINE COMPREHENSIVE
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
## 2020-11-17 NOTE — Patient Instructions (Signed)
General Headache Without Cause A headache is pain or discomfort that is felt around the head or neck area. There are many causes and types of headaches. In some cases, the cause may not be found. Follow these instructions at home: Watch your condition for any changes. Let your doctor know about them. Take these steps to help with your condition: Managing pain  Take over-the-counter and prescription medicines only as told by your doctor.  Lie down in a dark, quiet room when you have a headache.  If told, put ice on your head and neck area: ? Put ice in a plastic bag. ? Place a towel between your skin and the bag. ? Leave the ice on for 20 minutes, 2-3 times per day.  If told, put heat on the affected area. Use the heat source that your doctor recommends, such as a moist heat pack or a heating pad. ? Place a towel between your skin and the heat source. ? Leave the heat on for 20-30 minutes. ? Remove the heat if your skin turns bright red. This is very important if you are unable to feel pain, heat, or cold. You may have a greater risk of getting burned.  Keep lights dim if bright lights bother you or make your headaches worse.      Eating and drinking  Eat meals on a regular schedule.  If you drink alcohol: ? Limit how much you use to:  0-1 drink a day for women.  0-2 drinks a day for men. ? Be aware of how much alcohol is in your drink. In the [place] one drink equals one 12 oz bottle of beer (355 mL), one 5 oz glass of wine (148 mL), or one 1 oz glass of hard liquor (44 mL).  Stop drinking caffeine, or reduce how much caffeine you drink. General instructions  Keep a journal to find out if certain things bring on headaches. For example, write down: ? What you eat and drink. ? How much sleep you get. ? Any change to your diet or medicines.  Get a massage or try other ways to relax.  Limit stress.  Sit up straight. Do not tighten (tense) your muscles.  Do not use any  products that contain nicotine or tobacco. This includes cigarettes, e-cigarettes, and chewing tobacco. If you need help quitting, ask your doctor.  Exercise regularly as told by your doctor.  Get enough sleep. This often means 7-9 hours of sleep each night.  Keep all follow-up visits as told by your doctor. This is important.   Contact a doctor if:  Your symptoms are not helped by medicine.  You have a headache that feels different than the other headaches.  You feel sick to your stomach (nauseous) or you throw up (vomit).  You have a fever. Get help right away if:  Your headache gets very bad quickly.  Your headache gets worse after a lot of physical activity.  You keep throwing up.  You have a stiff neck.  You have trouble seeing.  You have trouble speaking.  You have pain in the eye or ear.  Your muscles are weak or you lose muscle control.  You lose your balance or have trouble walking.  You feel like you will pass out (faint) or you pass out.  You are mixed up (confused).  You have a seizure. Summary  A headache is pain or discomfort that is felt around the head or neck area.  There are many   causes and types of headaches. In some cases, the cause may not be found.  Keep a journal to help find out what causes your headaches. Watch your condition for any changes. Let your doctor know about them.  Contact a doctor if you have a headache that is different from usual, or if your headache is not helped by medicine.  Get help right away if your headache gets very bad, you throw up, you have trouble seeing, you lose your balance, or you have a seizure. This information is not intended to replace advice given to you by your health care provider. Make sure you discuss any questions you have with your health care provider. Document Revised: 01/26/2018 Document Reviewed: 01/26/2018 Elsevier Patient Education  2021 Elsevier Inc.  

## 2020-11-20 ENCOUNTER — Ambulatory Visit: Payer: Medicare HMO | Admitting: Gastroenterology

## 2020-11-24 ENCOUNTER — Other Ambulatory Visit: Payer: Self-pay

## 2020-11-24 ENCOUNTER — Encounter: Payer: Self-pay | Admitting: Physician Assistant

## 2020-11-24 ENCOUNTER — Ambulatory Visit (INDEPENDENT_AMBULATORY_CARE_PROVIDER_SITE_OTHER): Payer: Medicare HMO | Admitting: Physician Assistant

## 2020-11-24 VITALS — BP 126/58 | HR 58 | Temp 97.5°F | Ht 63.0 in | Wt 138.3 lb

## 2020-11-24 DIAGNOSIS — E785 Hyperlipidemia, unspecified: Secondary | ICD-10-CM

## 2020-11-24 DIAGNOSIS — I700 Atherosclerosis of aorta: Secondary | ICD-10-CM | POA: Diagnosis not present

## 2020-11-24 DIAGNOSIS — Z8669 Personal history of other diseases of the nervous system and sense organs: Secondary | ICD-10-CM
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
## 2020-11-24 NOTE — Patient Instructions (Signed)

## 2020-11-28 ENCOUNTER — Other Ambulatory Visit: Payer: Self-pay

## 2020-11-28 ENCOUNTER — Ambulatory Visit (INDEPENDENT_AMBULATORY_CARE_PROVIDER_SITE_OTHER): Payer: Medicare HMO | Admitting: Urology

## 2020-11-28 ENCOUNTER — Encounter: Payer: Self-pay | Admitting: Urology

## 2020-11-28 VITALS — BP 114/62 | HR 68 | Ht 63.0 in | Wt 136.0 lb

## 2020-11-28 DIAGNOSIS — N3941 Urge incontinence: Secondary | ICD-10-CM | POA: Diagnosis not present

## 2020-11-28 DIAGNOSIS — N3281 Overactive bladder: Secondary | ICD-10-CM

## 2020-11-28 MED ORDER — ONABOTULINUMTOXINA 100 UNITS IJ SOLR
100.0000 [IU] | Freq: Once | INTRAMUSCULAR | Status: AC
  Administered 2020-11-28: 100 [IU] via INTRAMUSCULAR

## 2020-11-28 MED ORDER — LIDOCAINE HCL 2 % IJ SOLN
50.0000 mL | Freq: Once | INTRAMUSCULAR | Status: AC
  Administered 2020-11-28: 1000 mg

## 2020-11-28 MED ORDER — SULFAMETHOXAZOLE-TRIMETHOPRIM 800-160 MG PO TABS
1.0000 | ORAL_TABLET | Freq: Once | ORAL | Status: AC
  Administered 2020-11-28: 1 via ORAL

## 2020-11-28 NOTE — Progress Notes (Signed)
   11/28/20  CC:  Chief Complaint  Patient presents with  . Cysto    Bladder Botox    HPI: 87 year old female with severe refractory OAB who presents today for intravesical Botox injection.  Please see previous notes for details.  She did very well with her previous treatment.  Prior to the procedure, intravesical lidocaine was administered and allowed to dwell for 30 minutes.  Please see CMA note.  Blood pressure 114/62, pulse 68, height 5\' 3"  (1.6 m), weight 136 lb (61.7 kg). NED. A&Ox3.   No respiratory distress   Abd soft, NT, ND Normal external genitalia with patent urethral meatus  Prior to the procedure, a single dose of p.o. antibiotics was administered.    Cystoscopy Procedure Note  Patient identification was confirmed, informed consent was obtained, and patient was prepped using Betadine solution.  Lidocaine jelly was administered per urethral meatus.    Procedure: - Flexible cystoscope introduced, without any difficulty.   - Thorough search of the bladder revealed:    normal urethral meatus    normal urothelium    no stones    no ulcers     no tumors    no urethral polyps    no trabeculation  - Ureteral orifices were normal in position and appearance.  At this point in time, using a flexible needle, 2 rows of 10 injections, 0.5 cc each were administered along the posterior bladder wall.  A total of 100 units of Botox was delivered into the detrusor muscle.  Post-Procedure: - Patient tolerated the procedure well  Assessment/ Plan:  1. Overactive bladder - lidocaine (XYLOCAINE) 2 % (with pres) injection 1,000 mg - botulinum toxin Type A (BOTOX) injection 100 Units - sulfamethoxazole-trimethoprim (BACTRIM DS) 800-160 MG per tablet 1 tablet  2. Urge incontinence - lidocaine (XYLOCAINE) 2 % (with pres) injection 1,000 mg - botulinum toxin Type A (BOTOX) injection 100 Units    Return for  .next botox injection  [name], MD

## 2020-11-28 NOTE — Progress Notes (Signed)
Bladder Instillation  Prior to Bladder Botox procedure, patient is present today for a Bladder Instillation of Lidocaine 2%. Patient was cleaned and prepped in a sterile fashion with betadine and lidocaine 2% jelly was instilled into the urethra.  A 14FR catheter was inserted, urine return was noted 12ml, urine was yellow in color.  50 ml of Lidocaine 2% was instilled into the bladder. The catheter was then removed. Patient tolerated well, no complications were noted Patient held in bladder for 30 minutes prior to procedure starting.   Performed by: Fonnie Jarvis, CMA

## 2020-11-30 ENCOUNTER — Other Ambulatory Visit: Payer: Medicare HMO

## 2020-11-30 DIAGNOSIS — H518 Other specified disorders of binocular movement: Secondary | ICD-10-CM | POA: Insufficient documentation

## 2020-11-30 DIAGNOSIS — W19XXXA Unspecified fall, initial encounter: Secondary | ICD-10-CM | POA: Insufficient documentation

## 2020-11-30 DIAGNOSIS — G4452 New daily persistent headache (NDPH): Secondary | ICD-10-CM | POA: Insufficient documentation

## 2020-12-11 ENCOUNTER — Other Ambulatory Visit: Payer: Self-pay

## 2020-12-11 ENCOUNTER — Ambulatory Visit: Payer: Medicare HMO | Admitting: Internal Medicine

## 2020-12-11 ENCOUNTER — Encounter: Payer: Self-pay | Admitting: Internal Medicine

## 2020-12-11 DIAGNOSIS — J479 Bronchiectasis, uncomplicated: Secondary | ICD-10-CM | POA: Diagnosis not present

## 2020-12-11 MED ORDER — AZITHROMYCIN 250 MG PO TABS: ORAL_TABLET | ORAL | 11 refills | Status: DC

## 2020-12-11 NOTE — Patient Instructions (Signed)
No change in recommendations although  I very strongly recommend you get the 3rd pfizer vaccine as soon as possible based on your risk of dying from the virus  and the proven safety and benefit of these vaccines against even the delta and omicron variants.        Please schedule a follow up visit in 12 months but call sooner if needed

## 2020-12-11 NOTE — Assessment & Plan Note (Signed)
Onset   ? around 2017  - see CT chest 01/12/2019 - Labs ordered 03/02/2019     alpha one AT phenotype  MM level 147 - Allergy profile 03/02/19 >  Eos 0.1  /  IgE  13 RAST neg  - Ig profile: 03/02/2019   Minimal decrease IgM and IgG - 04/15/2019 added prn cycles of zpak for flares  - PFT's  09/21/2019  FEV1 2.22 (128 % ) ratio 0.75  p 4 % improvement from saba p nothing prior to study    Adequate control on present rx, reviewed in detail with pt > no change in rx needed  = short course zpak for flares/ no bronchodilators needed   Advised re completing/ keeping up with covid 19 schedules and she agreed to reconsider.           Each maintenance medication was reviewed in detail including emphasizing most importantly the difference between maintenance and prns and under what circumstances the prns are to be triggered using an action plan format where appropriate.  Total time for H and P, chart review, counseling, reviewing  and generating customized AVS unique to this office visit / same day charting = 21 min

## 2020-12-11 NOTE — Progress Notes (Signed)
Cheryl Martin, female    DOB: July 12, 1931,    MRN: [id number]   Brief patient profile:  55 yowf MM/quit smoking 1975 moved from [place] 1984 with recurrent episodes of  bronchitis variable times every since arrival in [place] with some seasonal rhinitis esp in Spring and abnormal cxr since around 2017 with new dx of bronchiectasis by CT chest  01/12/2019 so referred to pulmonary clinic 03/02/2019 by Dr   Raliegh Scarlet     History of Present Illness  03/02/2019  Pulmonary/ 1st office eval/Taffany Heiser  Chief Complaint  Patient presents with  . Pulmonary Consult    Referred by Dr Raliegh Scarlet for bronchiectasis.    Dyspnea:  Can walk 25 min flat around a track each lap takes a min avg pace -faster most people her age Cough: just with flares / really not significant production chronically, just during flares of what she's been calling bronchitis which is only once a year at most.  Sleep: nasal congestion but doesn't keep her up, one pillow  SABA use: none New abd pain x 4 months no relation with meals  rec GERD diet  Bronchiectasis =    Whenever you develop cough congestion take mucinex or mucinex dm up to 1200 mg every 12 hours as needed        04/15/2019  f/u ov/Kylan Liberati re: bronchiectasis  Chief Complaint  Patient presents with  . Follow-up    Breathing is doing well. She has occ throat clearing.   Dyspnea:  Walking track > no change in ex tol  Cough: none x with flares , last jan/feb 2020  Sleeping: some nasal congestion / doesn't really keep her up  SABA use: none  02: none  rec In the event of a flare of cough with excess or nasty mucus > refillable zpak  pfts on return >nl         12/11/2020  f/u ov/Oswald Pott re: bronchiectasis on prn zpak Chief Complaint  Patient presents with  . Follow-up    Breathing is overall doing well. She has had slight increase in cough past few days, non prod and she relates to pollen allergy.   Dyspnea:  Not limited by breathing from desired activities   Cough: last  zpak x 55m/no colr to mucus  Sleeping: min nasal congestion noct, o/w no resp sympoms SABA use: none 02: none  Covid status:   vax x 2 last 09/02/19 mild arm ache    No obvious day to day or daytime variability or assoc excess/ purulent sputum or mucus plugs or hemoptysis or cp or chest tightness, subjective wheeze or overt sinus or hb symptoms.   Sleeping as above without nocturnal  or early am exacerbation  of respiratory  c/o's or need for noct saba. Also denies any obvious fluctuation of symptoms with weather or environmental changes or other aggravating or alleviating factors except as outlined above   No unusual exposure hx or h/o childhood pna/ asthma or knowledge of premature birth.  Current Allergies, Complete Past Medical History, Past Surgical History, Family History, and Social History were reviewed in [employer] record.  ROS  The following are not active complaints unless bolded Hoarseness, sore throat, dysphagia, dental problems, itching, sneezing,    discharge of excess mucus or purulent secretions, ear ache,   fever, chills, sweats, unintended wt loss or wt gain, classically pleuritic or exertional cp,  orthopnea pnd or arm/hand swelling  or leg swelling, presyncope, palpitations, abdominal pain, anorexia, nausea, vomiting, diarrhea  or change in bowel habits or change in bladder habits, change in stools or change in urine, dysuria, hematuria,  rash, arthralgias, visual complaints, headache, numbness, weakness or ataxia or problems with walking or coordination,  change in mood or  memory.        Current Meds  Medication Sig  . acetaminophen (TYLENOL) 500 MG tablet Take 1,000 mg by mouth every 6 (six) hours as needed for moderate pain or headache.  Marland Kitchen aspirin EC 81 MG tablet Take 1 tablet (81 mg total) by mouth daily.  Marland Kitchen Bioflavonoid Products (VITAMIN C) CHEW Chew 1 tablet by mouth daily.  Marland Kitchen BIOTIN PO Take 1 tablet by mouth daily.  . Cholecalciferol  (VITAMIN D-3 PO) Take 1 capsule by mouth daily with breakfast.  . Cyanocobalamin (B-12) 1000 MCG SUBL Place 2,000 mcg under the tongue daily.  Marland Kitchen docusate sodium (COLACE) 100 MG capsule Take 100 mg by mouth every other day. Alternate taking miralax one day and colace the next  . Melatonin 3 MG CAPS Take 3 mg by mouth at bedtime as needed (sleep).  . Multiple Vitamin (MULTIVITAMIN WITH MINERALS) TABS tablet Take 1 tablet by mouth daily.  . polyethylene glycol (MIRALAX / GLYCOLAX) 17 g packet Take 17 g by mouth every other day. Alternate taking miralax one day and colace the next  . rosuvastatin (CRESTOR) 10 MG tablet TAKE 1 TABLET BY MOUTH EVERYDAY AT BEDTIME  . Sodium Chloride, Hypertonic, (MURO 128 OP) Place 1 drop into both eyes See admin instructions. Instill 1 drop into each eye in the morning, then as needed daily for dryness/irritation  . UNKNOWN TO PATIENT OTC liquid for dry mouth                       Past Medical History:  Diagnosis Date  . ADD (attention deficit disorder)   . Allergy   . Anemia    as a teenager  . Arthritis   . Complication of anesthesia    slow to wake up after colonoscopy  . Edema   . Hearing loss   . History of hiatal hernia    hx of  . Hypothyroidism    hx of as child and during pregnancy  . Neuromuscular disorder ([place])   . Nocturia   . Pneumonia    hx of after bronchitis  . PONV (postoperative nausea and vomiting)   . Thyroid disease   Hands feet swelling     Objective:       12/11/2020       136  06/13/2020     136  09/21/2019         140  04/15/19 138 lb (62.6 kg)  04/08/19 137 lb (62.1 kg)  03/15/19 136 lb (61.7 kg)    Vital signs reviewed  12/11/2020  - Note at rest 02 sats  99% on RA   General appearance:    Amb well preserved wf nad    HEENT : pt wearing mask not removed for exam due to covid -19 concerns.    NECK :  without JVD/Nodes/TM/ nl carotid upstrokes bilaterally   LUNGS: no acc muscle use,  Nl contour chest  which is clear to A and P bilaterally without cough on insp or exp maneuvers   CV:  RRR  no s3 or murmur or increase in P2, and no edema   ABD:  soft and nontender with nl inspiratory excursion in the supine position. No bruits or organomegaly appreciated,  bowel sounds nl  MS:  Nl gait/ ext warm without deformities, calf tenderness, cyanosis or clubbing No obvious joint restrictions   SKIN: warm and dry without lesions    NEURO:  alert, approp, nl sensorium with  no motor or cerebellar deficits apparent.         I personally reviewed images and agree with radiology impression as follows:   Chest CT chest cuts on abd ct from 10/23/20 Lower chest: 8 x 4 mm subpleural nodule in the right middle lobe, unchanged from the 2019 chest CT and considered benign. Mild bibasilar atelectasis. No pleural effusion. Coronary atherosclerosis. Unchanged small pericardial effusion. Moderate-sized sliding hiatal hernia.      Assessment

## 2020-12-12 ENCOUNTER — Ambulatory Visit (INDEPENDENT_AMBULATORY_CARE_PROVIDER_SITE_OTHER): Payer: Medicare HMO | Admitting: Physician Assistant

## 2020-12-12 ENCOUNTER — Encounter: Payer: Self-pay | Admitting: Physician Assistant

## 2020-12-12 ENCOUNTER — Ambulatory Visit: Payer: Self-pay | Admitting: Physician Assistant

## 2020-12-12 VITALS — BP 127/76 | HR 60 | Ht 63.0 in | Wt 136.0 lb

## 2020-12-12 DIAGNOSIS — N3281 Overactive bladder: Secondary | ICD-10-CM

## 2020-12-12 LAB — BLADDER SCAN AMB NON-IMAGING
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
## 2020-12-14 ENCOUNTER — Ambulatory Visit: Payer: Self-pay | Admitting: Physician Assistant

## 2020-12-14 LAB — URINALYSIS, COMPLETE
Bilirubin, UA: NEGATIVE
Glucose, UA: NEGATIVE
Ketones, UA: NEGATIVE
Leukocytes,UA: NEGATIVE
Nitrite, UA: NEGATIVE
Protein,UA: NEGATIVE
Specific Gravity, UA: 1.015 (ref 1.005–1.030)
Urobilinogen, Ur: 0.2 mg/dL (ref 0.2–1.0)
pH, UA: 7.5 (ref 5.0–7.5)

## 2020-12-14 LAB — MICROSCOPIC EXAMINATION
Bacteria, UA: NONE SEEN
Epithelial Cells (non renal): NONE SEEN /hpf (ref 0–10)
WBC, UA: NONE SEEN /hpf (ref 0–5)

## 2020-12-19 ENCOUNTER — Ambulatory Visit: Payer: Medicare HMO | Admitting: Physician Assistant

## 2020-12-22 ENCOUNTER — Encounter: Payer: Self-pay | Admitting: Nurse Practitioner

## 2020-12-22 ENCOUNTER — Ambulatory Visit: Payer: Medicare HMO | Admitting: Nurse Practitioner

## 2020-12-22 VITALS — BP 92/60 | HR 62 | Ht 63.0 in | Wt 137.0 lb

## 2020-12-22 DIAGNOSIS — G8929 Other chronic pain: Secondary | ICD-10-CM | POA: Diagnosis not present

## 2020-12-22 DIAGNOSIS — K5909 Other constipation: Secondary | ICD-10-CM | POA: Diagnosis not present

## 2020-12-22 DIAGNOSIS — R1012 Left upper quadrant pain: Secondary | ICD-10-CM

## 2020-12-22 NOTE — Patient Instructions (Signed)
The [facility] providers would like to encourage you to use [facility] to communicate with providers for non-urgent requests or questions.  Due to long hold times on the telephone, sending your provider a message by [facility] may be faster and more efficient way to get a response. Please allow 48 business hours for a response.  Please remember that this is for non-urgent requests/questions.  If you are age 85 or older, your body mass index should be between 23-30. Your Body mass index is 24.27 kg/m. If this is out of the aforementioned range listed, please consider follow up with your Primary Care Provider.  RECOMMENDATIONS: Continue taking Miralax once daily, may increase to twice a day if needed. May try Linzess if constipation worsens. Please call our office if your symptoms worsen.  It was great seeing you today! Thank you for entrusting me with your care and choosing [facility].  Noralyn Pick, CRNP
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
## 2020-12-29 NOTE — Progress Notes (Signed)
Agree with assessment and plan as outlined. Suspect her pain may be musculoskeletal given positional nature of it and negative workup otherwise

## 2021-01-28 ENCOUNTER — Other Ambulatory Visit: Payer: Self-pay | Admitting: Physician Assistant

## 2021-01-28 DIAGNOSIS — G459 Transient cerebral ischemic attack, unspecified: Secondary | ICD-10-CM

## 2021-02-09 ENCOUNTER — Telehealth: Payer: Self-pay | Admitting: Physician Assistant

## 2021-02-09 ENCOUNTER — Encounter (HOSPITAL_COMMUNITY): Payer: Self-pay | Admitting: Emergency Medicine

## 2021-02-09 ENCOUNTER — Emergency Department (HOSPITAL_COMMUNITY): Payer: Medicare HMO

## 2021-02-09 ENCOUNTER — Emergency Department (HOSPITAL_COMMUNITY)
Admission: EM | Admit: 2021-02-09 | Discharge: 2021-02-09 | Disposition: A | Discharge status: Home / Self Care | Payer: Medicare HMO | Attending: Emergency Medicine | Admitting: Emergency Medicine

## 2021-02-09 ENCOUNTER — Emergency Department (HOSPITAL_BASED_OUTPATIENT_CLINIC_OR_DEPARTMENT_OTHER): Payer: Medicare HMO

## 2021-02-09 DIAGNOSIS — Z87891 Personal history of nicotine dependence: Secondary | ICD-10-CM | POA: Insufficient documentation

## 2021-02-09 DIAGNOSIS — R600 Localized edema: Secondary | ICD-10-CM | POA: Diagnosis not present

## 2021-02-09 DIAGNOSIS — Z7982 Long term (current) use of aspirin: Secondary | ICD-10-CM | POA: Diagnosis not present

## 2021-02-09 DIAGNOSIS — Z20822 Contact with and (suspected) exposure to covid-19: Secondary | ICD-10-CM | POA: Diagnosis not present

## 2021-02-09 DIAGNOSIS — I2510 Atherosclerotic heart disease of native coronary artery without angina pectoris: Secondary | ICD-10-CM | POA: Diagnosis not present

## 2021-02-09 DIAGNOSIS — Z96652 Presence of left artificial knee joint: Secondary | ICD-10-CM | POA: Insufficient documentation

## 2021-02-09 DIAGNOSIS — E039 Hypothyroidism, unspecified: Secondary | ICD-10-CM | POA: Diagnosis not present

## 2021-02-09 DIAGNOSIS — M79605 Pain in left leg: Secondary | ICD-10-CM | POA: Diagnosis not present

## 2021-02-09 DIAGNOSIS — R0602 Shortness of breath: Secondary | ICD-10-CM | POA: Diagnosis not present

## 2021-02-09 DIAGNOSIS — U071 COVID-19: Secondary | ICD-10-CM | POA: Diagnosis not present

## 2021-02-09 DIAGNOSIS — I10 Essential (primary) hypertension: Secondary | ICD-10-CM | POA: Diagnosis not present

## 2021-02-09 DIAGNOSIS — K449 Diaphragmatic hernia without obstruction or gangrene: Secondary | ICD-10-CM | POA: Diagnosis not present

## 2021-02-09 DIAGNOSIS — M7989 Other specified soft tissue disorders: Secondary | ICD-10-CM | POA: Diagnosis not present

## 2021-02-09 DIAGNOSIS — Z79899 Other long term (current) drug therapy: Secondary | ICD-10-CM | POA: Insufficient documentation

## 2021-02-09 LAB — CBC WITH DIFFERENTIAL/PLATELET
Abs Immature Granulocytes: 0.01 10*3/uL (ref 0.00–0.07)
Basophils Absolute: 0 10*3/uL (ref 0.0–0.1)
Basophils Relative: 1 %
Eosinophils Absolute: 0 10*3/uL (ref 0.0–0.5)
Eosinophils Relative: 0 %
HCT: 36.5 % (ref 36.0–46.0)
Hemoglobin: 12.3 g/dL (ref 12.0–15.0)
Immature Granulocytes: 0 %
Lymphocytes Relative: 15 %
Lymphs Abs: 0.7 10*3/uL (ref 0.7–4.0)
MCH: 31.9 pg (ref 26.0–34.0)
MCHC: 33.7 g/dL (ref 30.0–36.0)
MCV: 94.6 fL (ref 80.0–100.0)
Monocytes Absolute: 0.8 10*3/uL (ref 0.1–1.0)
Monocytes Relative: 16 %
Neutro Abs: 3.2 10*3/uL (ref 1.7–7.7)
Neutrophils Relative %: 68 %
Platelets: 148 10*3/uL — ABNORMAL LOW (ref 150–400)
RBC: 3.86 MIL/uL — ABNORMAL LOW (ref 3.87–5.11)
RDW: 12.2 % (ref 11.5–15.5)
WBC: 4.7 10*3/uL (ref 4.0–10.5)
nRBC: 0 % (ref 0.0–0.2)

## 2021-02-09 LAB — BASIC METABOLIC PANEL
Anion gap: 8 (ref 5–15)
BUN: 11 mg/dL (ref 8–23)
CO2: 24 mmol/L (ref 22–32)
Calcium: 9 mg/dL (ref 8.9–10.3)
Chloride: 102 mmol/L (ref 98–111)
Creatinine, Ser: 0.79 mg/dL (ref 0.44–1.00)
GFR, Estimated: >60 mL/min (ref >60)
Glucose, Bld: 87 mg/dL (ref 70–99)
Potassium: 4.3 mmol/L (ref 3.5–5.1)
Sodium: 134 mmol/L — ABNORMAL LOW (ref 135–145)

## 2021-02-09 LAB — BRAIN NATRIURETIC PEPTIDE: B Natriuretic Peptide: 169.3 pg/mL — ABNORMAL HIGH (ref 0.0–100.0)

## 2021-02-09 LAB — RESP PANEL BY RT-PCR (FLU A&B, COVID) ARPGX2
Influenza A by PCR: NEGATIVE
Influenza B by PCR: NEGATIVE
SARS Coronavirus 2 by RT PCR: POSITIVE — AB

## 2021-02-09 MED ORDER — NIRMATRELVIR/RITONAVIR (PAXLOVID)TABLET: 3.0000 | ORAL_TABLET | Freq: Two times a day (BID) | ORAL | 0 refills | Status: AC

## 2021-02-09 MED ORDER — ACETAMINOPHEN 325 MG PO TABS
650.0000 mg | ORAL_TABLET | Freq: Once | ORAL | Status: AC
  Administered 2021-02-09: 650 mg via ORAL
  Filled 2021-02-09: qty 2

## 2021-02-09 NOTE — Telephone Encounter (Signed)
Patient called office sounding very short of breath and stating her daughter has covid and she thinks she might have pneumonia. Chest is hurting from all the coughing. Patient had a hard time getting through a sentence without coughing and taking another breath. I advised patient she needed to call EMS and go to the ED for evaluation. Patient is at higher risk of complications with covid and is having a harder time breathing. Patient verbalized understanding and states she is going to the ED. AS, CMA

## 2021-02-09 NOTE — Progress Notes (Signed)
Left lower extremity venous duplex completed. Refer to "CV Proc" under chart review to view preliminary results.  02/09/2021 7:22 PM Kelby Aline., MHA, RVT, RDCS, RDMS

## 2021-02-09 NOTE — ED Triage Notes (Signed)
Pt endorses SOB and sent by PCP for further work up. Denies CP. Pt was covid swabbed this morning and waiting results. Recent travels and was in car for extended time.

## 2021-02-09 NOTE — ED Provider Notes (Signed)
Emergency Medicine Provider Triage Evaluation Note  Cheryl Martin , a 87 y.o. female  was evaluated in triage.  Pt complains of shortness of breath x1 day.  Daughter was recently diagnosed with COVID.  Patient had outpatient antibiotics, took a COVID test earlier [facility] but has not had the results yet.  .  Review of Systems  Positive: Body aches, cough, shortness of breath Negative: Fever, leg swelling, chest pain  Physical Exam  There were no vitals taken for this visit. Gen:   Awake, no distress   Resp:  Normal effort  MSK:   Moves extremities without difficulty  Other:    Medical Decision Making  Medically screening exam initiated at 12:41 PM.  Appropriate orders placed.  Cheryl Martin was informed that the remainder of the evaluation will be completed by another provider, this initial triage assessment does not replace that evaluation, and the importance of remaining in the ED until their evaluation is complete.     Cheryl Raring, PA-C 02/09/21 1241    Cheryl Reichert, MD 02/09/21 [phone]
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
## 2021-02-09 NOTE — ED Notes (Signed)
Pt verbalizes understanding of discharge instructions. Opportunity for questions and answers were provided. Pt discharged from the ED.   ?

## 2021-02-09 NOTE — Discharge Instructions (Addendum)
Hold (do NOT take) your Crestor (rosuvastatin) while taking Paxlovid.  Take Paxlovid as prescribed, start this evening. Follow up with your primary care provider, return to ER as needed.

## 2021-03-29 ENCOUNTER — Encounter: Payer: Self-pay | Admitting: Physician Assistant

## 2021-03-29 ENCOUNTER — Other Ambulatory Visit: Payer: Self-pay

## 2021-03-29 ENCOUNTER — Ambulatory Visit (INDEPENDENT_AMBULATORY_CARE_PROVIDER_SITE_OTHER): Payer: Medicare HMO | Admitting: Physician Assistant

## 2021-03-29 VITALS — BP 118/67 | HR 56 | Temp 97.5°F | Ht 63.0 in | Wt 131.5 lb

## 2021-03-29 DIAGNOSIS — Z130 Encounter for screening for diseases of the blood and blood-forming organs and certain disorders involving the immune mechanism: Secondary | ICD-10-CM

## 2021-03-29 DIAGNOSIS — Z8639 Personal history of other endocrine, nutritional and metabolic disease: Secondary | ICD-10-CM | POA: Diagnosis not present

## 2021-03-29 DIAGNOSIS — E079 Disorder of thyroid, unspecified: Secondary | ICD-10-CM | POA: Diagnosis not present

## 2021-03-29 DIAGNOSIS — Z13228 Encounter for screening for other metabolic disorders: Secondary | ICD-10-CM

## 2021-03-29 DIAGNOSIS — Z1329 Encounter for screening for other suspected endocrine disorder: Secondary | ICD-10-CM | POA: Diagnosis not present

## 2021-03-29 DIAGNOSIS — E785 Hyperlipidemia, unspecified: Secondary | ICD-10-CM | POA: Diagnosis not present

## 2021-03-29 DIAGNOSIS — Z0000 Encounter for general adult medical examination without abnormal findings: Secondary | ICD-10-CM | POA: Diagnosis not present

## 2021-03-29 DIAGNOSIS — Z1321 Encounter for screening for nutritional disorder: Secondary | ICD-10-CM | POA: Diagnosis not present

## 2021-03-29 NOTE — Patient Instructions (Signed)
Preventive Care 40 Years and Older, Female Preventive care refers to lifestyle choices and visits with your health care provider that can promote health and wellness. This includes: A yearly physical exam. This is also called an annual wellness visit. Regular dental and eye exams. Immunizations. Screening for certain conditions. Healthy lifestyle choices, such as: Eating a healthy diet. Getting regular exercise. Not using drugs or products that contain nicotine and tobacco. Limiting alcohol use. What can I expect for my preventive care visit? Physical exam Your health care provider will check your: Height and weight. These may be used to calculate your BMI (body mass index). BMI is a measurement that tells if you are at a healthy weight. Heart rate and blood pressure. Body temperature. Skin for abnormal spots. Counseling Your health care provider may ask you questions about your: Past medical problems. Family's medical history. Alcohol, tobacco, and drug use. Emotional well-being. Home life and relationship well-being. Sexual activity. Diet, exercise, and sleep habits. History of falls. Memory and ability to understand (cognition). Work and work Statistician. Pregnancy and menstrual history. Access to firearms. What immunizations do I need? Vaccines are usually given at various ages, according to a schedule. Your health care provider will recommend vaccines for you based on your age, medical history, and lifestyle or other factors, such as travel or where you work. What tests do I need? Blood tests Lipid and cholesterol levels. These may be checked every 5 years, or more often depending on your overall health. Hepatitis C test. Hepatitis B test. Screening Lung cancer screening. You may have this screening every year starting at age 33 if you have a 30-pack-year history of smoking and currently smoke or have quit within the past 15 years. Colorectal cancer screening. All  adults should have this screening starting at age 73 and continuing until age 9. Your health care provider may recommend screening at age 31 if you are at increased risk. You will have tests every 1-10 years, depending on your results and the type of screening test. Diabetes screening. This is done by checking your blood sugar (glucose) after you have not eaten for a while (fasting). You may have this done every 1-3 years. Mammogram. This may be done every 1-2 years. Talk with your health care provider about how often you should have regular mammograms. Abdominal aortic aneurysm (AAA) screening. You may need this if you are a current or former smoker. BRCA-related cancer screening. This may be done if you have a family history of breast, ovarian, tubal, or peritoneal cancers. Other tests STD (sexually transmitted disease) testing, if you are at risk. Bone density scan. This is done to screen for osteoporosis. You may have this done starting at age 43. Talk with your health care provider about your test results, treatment options, and if necessary, the need for more tests. Follow these instructions at home: Eating and drinking  Eat a diet that includes fresh fruits and vegetables, whole grains, lean protein, and low-fat dairy products. Limit your intake of foods with high amounts of sugar, saturated fats, and salt. Take vitamin and mineral supplements as recommended by your health care provider. Do not drink alcohol if your health care provider tells you not to drink. If you drink alcohol: Limit how much you have to 0-1 drink a day. Be aware of how much alcohol is in your drink. In the [place] one drink equals one 12 oz bottle of beer (355 mL), one 5 oz glass of wine (148 mL), or one  1 oz glass of hard liquor (44 mL). Lifestyle Take daily care of your teeth and gums. Brush your teeth every morning and night with fluoride toothpaste. Floss one time each day. Stay active. Exercise for at least  30 minutes 5 or more days each week. Do not use any products that contain nicotine or tobacco, such as cigarettes, e-cigarettes, and chewing tobacco. If you need help quitting, ask your health care provider. Do not use drugs. If you are sexually active, practice safe sex. Use a condom or other form of protection in order to prevent STIs (sexually transmitted infections). Talk with your health care provider about taking a low-dose aspirin or statin. Find healthy ways to cope with stress, such as: Meditation, yoga, or listening to music. Journaling. Talking to a trusted person. Spending time with friends and family. Safety Always wear your seat belt while driving or riding in a vehicle. Do not drive: If you have been drinking alcohol. Do not ride with someone who has been drinking. When you are tired or distracted. While texting. Wear a helmet and other protective equipment during sports activities. If you have firearms in your house, make sure you follow all gun safety procedures. What's next? Visit your health care provider once a year for an annual wellness visit. Ask your health care provider how often you should have your eyes and teeth checked. Stay up to date on all vaccines. This information is not intended to replace advice given to you by your health care provider. Make sure you discuss any questions you have with your health care provider. Document Revised: 09/15/2020 Document Reviewed: 07/02/2018 Elsevier Patient Education  2022 [employer].
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
## 2021-03-30 LAB — COMPREHENSIVE METABOLIC PANEL
ALT: 15 IU/L (ref 0–32)
AST: 23 IU/L (ref 0–40)
Albumin/Globulin Ratio: 3 — ABNORMAL HIGH (ref 1.2–2.2)
Albumin: 4.5 g/dL (ref 3.6–4.6)
Alkaline Phosphatase: 97 IU/L (ref 44–121)
BUN/Creatinine Ratio: 21 (ref 12–28)
BUN: 15 mg/dL (ref 8–27)
Bilirubin Total: 0.5 mg/dL (ref 0.0–1.2)
CO2: 26 mmol/L (ref 20–29)
Calcium: 10.1 mg/dL (ref 8.7–10.3)
Chloride: 100 mmol/L (ref 96–106)
Creatinine, Ser: 0.72 mg/dL (ref 0.57–1.00)
Globulin, Total: 1.5 g/dL (ref 1.5–4.5)
Glucose: 83 mg/dL (ref 65–99)
Potassium: 4.4 mmol/L (ref 3.5–5.2)
Sodium: 139 mmol/L (ref 134–144)
Total Protein: 6 g/dL (ref 6.0–8.5)
eGFR: 81 mL/min/{1.73_m2} (ref >59)

## 2021-03-30 LAB — LIPID PANEL
Chol/HDL Ratio: 2.5 ratio (ref 0.0–4.4)
Cholesterol, Total: 158 mg/dL (ref 100–199)
HDL: 64 mg/dL (ref >39)
LDL Chol Calc (NIH): 77 mg/dL (ref 0–99)
Triglycerides: 94 mg/dL (ref 0–149)
VLDL Cholesterol Cal: 17 mg/dL (ref 5–40)

## 2021-03-30 LAB — CBC WITH DIFFERENTIAL/PLATELET
Basophils Absolute: 0.1 10*3/uL (ref 0.0–0.2)
Basos: 1 %
EOS (ABSOLUTE): 0.2 10*3/uL (ref 0.0–0.4)
Eos: 5 %
Hematocrit: 38.9 % (ref 34.0–46.6)
Hemoglobin: 12.7 g/dL (ref 11.1–15.9)
Immature Grans (Abs): 0 10*3/uL (ref 0.0–0.1)
Immature Granulocytes: 0 %
Lymphocytes Absolute: 1.1 10*3/uL (ref 0.7–3.1)
Lymphs: 24 %
MCH: 30.5 pg (ref 26.6–33.0)
MCHC: 32.6 g/dL (ref 31.5–35.7)
MCV: 94 fL (ref 79–97)
Monocytes Absolute: 0.5 10*3/uL (ref 0.1–0.9)
Monocytes: 10 %
Neutrophils Absolute: 2.7 10*3/uL (ref 1.4–7.0)
Neutrophils: 60 %
Platelets: 187 10*3/uL (ref 150–450)
RBC: 4.16 x10E6/uL (ref 3.77–5.28)
RDW: 12.3 % (ref 11.7–15.4)
WBC: 4.5 10*3/uL (ref 3.4–10.8)

## 2021-03-30 LAB — VITAMIN B12: Vitamin B-12: 1778 pg/mL — ABNORMAL HIGH (ref 232–1245)

## 2021-03-30 LAB — HEMOGLOBIN A1C
Est. average glucose Bld gHb Est-mCnc: 100 mg/dL
Hgb A1c MFr Bld: 5.1 % (ref 4.8–5.6)

## 2021-03-30 LAB — TSH: TSH: 2.58 u[IU]/mL (ref 0.450–4.500)

## 2021-04-19 DIAGNOSIS — K08409 Partial loss of teeth, unspecified cause, unspecified class: Secondary | ICD-10-CM | POA: Diagnosis not present

## 2021-04-19 DIAGNOSIS — M1990 Unspecified osteoarthritis, unspecified site: Secondary | ICD-10-CM | POA: Diagnosis not present

## 2021-04-19 DIAGNOSIS — G8929 Other chronic pain: Secondary | ICD-10-CM | POA: Diagnosis not present

## 2021-04-19 DIAGNOSIS — R030 Elevated blood-pressure reading, without diagnosis of hypertension: Secondary | ICD-10-CM | POA: Diagnosis not present

## 2021-04-19 DIAGNOSIS — Z791 Long term (current) use of non-steroidal anti-inflammatories (NSAID): Secondary | ICD-10-CM | POA: Diagnosis not present

## 2021-04-19 DIAGNOSIS — Z7982 Long term (current) use of aspirin: Secondary | ICD-10-CM | POA: Diagnosis not present

## 2021-04-19 DIAGNOSIS — I2510 Atherosclerotic heart disease of native coronary artery without angina pectoris: Secondary | ICD-10-CM | POA: Diagnosis not present

## 2021-04-19 DIAGNOSIS — N3941 Urge incontinence: Secondary | ICD-10-CM | POA: Diagnosis not present

## 2021-04-19 DIAGNOSIS — K5900 Constipation, unspecified: Secondary | ICD-10-CM | POA: Diagnosis not present

## 2021-04-19 DIAGNOSIS — M545 Low back pain, unspecified: Secondary | ICD-10-CM | POA: Diagnosis not present

## 2021-04-19 DIAGNOSIS — J309 Allergic rhinitis, unspecified: Secondary | ICD-10-CM | POA: Diagnosis not present

## 2021-04-19 DIAGNOSIS — E785 Hyperlipidemia, unspecified: Secondary | ICD-10-CM | POA: Diagnosis not present

## 2021-05-14 ENCOUNTER — Other Ambulatory Visit: Payer: Self-pay

## 2021-05-14 ENCOUNTER — Ambulatory Visit: Payer: Medicare HMO | Admitting: Physician Assistant

## 2021-05-14 ENCOUNTER — Encounter: Payer: Self-pay | Admitting: Physician Assistant

## 2021-05-14 VITALS — BP 122/73 | HR 62 | Ht 63.0 in | Wt 131.0 lb

## 2021-05-14 DIAGNOSIS — N3281 Overactive bladder: Secondary | ICD-10-CM
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
## 2021-05-21 DIAGNOSIS — H35373 Puckering of macula, bilateral: Secondary | ICD-10-CM | POA: Diagnosis not present

## 2021-05-21 DIAGNOSIS — H04123 Dry eye syndrome of bilateral lacrimal glands: Secondary | ICD-10-CM | POA: Diagnosis not present

## 2021-05-21 DIAGNOSIS — H52213 Irregular astigmatism, bilateral: Secondary | ICD-10-CM | POA: Diagnosis not present

## 2021-05-21 DIAGNOSIS — D3132 Benign neoplasm of left choroid: Secondary | ICD-10-CM | POA: Diagnosis not present

## 2021-05-21 DIAGNOSIS — H18593 Other hereditary corneal dystrophies, bilateral: Secondary | ICD-10-CM | POA: Diagnosis not present

## 2021-05-21 DIAGNOSIS — Z961 Presence of intraocular lens: Secondary | ICD-10-CM | POA: Diagnosis not present

## 2021-05-21 DIAGNOSIS — H31101 Choroidal degeneration, unspecified, right eye: Secondary | ICD-10-CM | POA: Diagnosis not present

## 2021-07-18 ENCOUNTER — Telehealth: Payer: Self-pay | Admitting: *Deleted

## 2021-07-18 DIAGNOSIS — N3281 Overactive bladder: Secondary | ICD-10-CM

## 2021-07-18 NOTE — Telephone Encounter (Addendum)
Called patient to inform prior to Botox UA/UCX will need to be completed. Lab visit scheduled and order placed. Voiced understanding.

## 2021-07-19 ENCOUNTER — Other Ambulatory Visit: Payer: Medicare HMO

## 2021-07-19 DIAGNOSIS — N3281 Overactive bladder: Secondary | ICD-10-CM | POA: Diagnosis not present

## 2021-07-19 LAB — URINALYSIS, COMPLETE
Bilirubin, UA: NEGATIVE
Glucose, UA: NEGATIVE
Ketones, UA: NEGATIVE
Leukocytes,UA: NEGATIVE
Nitrite, UA: NEGATIVE
Protein,UA: NEGATIVE
Specific Gravity, UA: 1.015 (ref 1.005–1.030)
Urobilinogen, Ur: 0.2 mg/dL (ref 0.2–1.0)
pH, UA: 7 (ref 5.0–7.5)

## 2021-07-19 LAB — MICROSCOPIC EXAMINATION: Bacteria, UA: NONE SEEN

## 2021-07-25 LAB — CULTURE, URINE COMPREHENSIVE

## 2021-07-30 NOTE — Progress Notes (Signed)
° °  07/31/21 CC: No chief complaint on file.   HPI:   Cheryl Martin is a 88 y.o. female with a personal history of OAB and urge incontinence, who presents today for Botox injection.   Her last Botox injection was on 11/28/2020.   She is doing well today.   Vitals:   07/31/21 1317  BP: 125/67  Pulse: 62  NED. A&Ox3.   No respiratory distress   Abd soft, NT, ND Normal external genitalia with patent urethral meatus  Cystoscopy Procedure Note   Patient identification was confirmed, informed consent was obtained, and patient was prepped using Betadine solution.  Lidocaine jelly was administered per urethral meatus.     Procedure: - Flexible cystoscope introduced, without any difficulty.   - Thorough search of the bladder revealed:    normal urethral meatus    normal urothelium    no stones    no ulcers     no tumors    no urethral polyps    no trabeculation   - Ureteral orifices were normal in position and appearance.   A Botox injection needle was used to inject a total of 100 units which was reconstituted in a total of 10 cc of saline.  A total of 2 rows of 5 injections (10 mL) was injected into the muscularis of the bladder.  This was well-tolerated.  There is slight oozing from a few of the injection sites but no diffuse bleeding.   Post-Procedure: - Patient tolerated the procedure well   Patient will f/u when she is due for her next Botox injection.   Assessment and Plan:   1. Overactive bladder - lidocaine (XYLOCAINE) 2 % (with pres) injection 1,000 mg - botulinum toxin Type A (BOTOX) injection 100 Units - sulfamethoxazole-trimethoprim (BACTRIM DS) 800-160 MG per tablet 1 tablet   2. Urge incontinence - lidocaine (XYLOCAINE) 2 % (with pres) injection 1,000 mg - botulinum toxin Type A (BOTOX) injection 100 Units   Return in 6 months for Botox injection  Guy Sandifer Adrain Butrick,acting as a scribe for [name], MD.,have documented all relevant documentation on  the behalf of [name], MD,as directed by  [name], MD while in the presence of [name], MD.  I have reviewed the above documentation for accuracy and completeness, and I agree with the above.   [name], MD

## 2021-07-31 ENCOUNTER — Encounter: Payer: Self-pay | Admitting: Urology

## 2021-07-31 ENCOUNTER — Other Ambulatory Visit: Payer: Self-pay

## 2021-07-31 ENCOUNTER — Ambulatory Visit: Payer: Medicare HMO | Admitting: Urology

## 2021-07-31 VITALS — BP 125/67 | HR 62 | Ht 63.0 in | Wt 131.0 lb

## 2021-07-31 DIAGNOSIS — N3941 Urge incontinence: Secondary | ICD-10-CM | POA: Diagnosis not present

## 2021-07-31 DIAGNOSIS — N3281 Overactive bladder: Secondary | ICD-10-CM | POA: Diagnosis not present

## 2021-07-31 LAB — URINALYSIS, COMPLETE
Bilirubin, UA: NEGATIVE
Glucose, UA: NEGATIVE
Ketones, UA: NEGATIVE
Leukocytes,UA: NEGATIVE
Nitrite, UA: NEGATIVE
Protein,UA: NEGATIVE
Specific Gravity, UA: 1.015 (ref 1.005–1.030)
Urobilinogen, Ur: 0.2 mg/dL (ref 0.2–1.0)
pH, UA: 7 (ref 5.0–7.5)

## 2021-07-31 LAB — MICROSCOPIC EXAMINATION
Bacteria, UA: NONE SEEN
Epithelial Cells (non renal): NONE SEEN /hpf (ref 0–10)

## 2021-07-31 MED ORDER — LIDOCAINE HCL 2 % IJ SOLN
50.0000 mL | Freq: Once | INTRAMUSCULAR | Status: AC
  Administered 2021-07-31: 1000 mg

## 2021-07-31 MED ORDER — ONABOTULINUMTOXINA 100 UNITS IJ SOLR
100.0000 [IU] | Freq: Once | INTRAMUSCULAR | Status: AC
  Administered 2021-07-31: 100 [IU] via INTRAMUSCULAR

## 2021-07-31 NOTE — Progress Notes (Signed)
Bladder Instillation  Due to Botox patient is present today for a Bladder Instillation of 2% lidocain. Patient was cleaned and prepped in a sterile fashion with betadine and lidocaine 2% jelly was instilled into the urethra.  A 14FR catheter was inserted, urine return was noted 53ml, urine was yellow in color.  50 ml was instilled into the bladder. The catheter was then removed. Patient tolerated well, no complications were noted Patient held in bladder for 30 minutes prior to procedure starting.   Performed by: Verlene Mayer, [place]

## 2021-08-09 ENCOUNTER — Other Ambulatory Visit: Payer: Medicare HMO

## 2021-08-09 ENCOUNTER — Other Ambulatory Visit: Payer: Self-pay

## 2021-08-09 DIAGNOSIS — Z0000 Encounter for general adult medical examination without abnormal findings: Secondary | ICD-10-CM

## 2021-08-10 LAB — ABO/RH: Rh Factor: POSITIVE
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
## 2021-08-29 ENCOUNTER — Other Ambulatory Visit: Payer: Self-pay

## 2021-08-29 ENCOUNTER — Ambulatory Visit (INDEPENDENT_AMBULATORY_CARE_PROVIDER_SITE_OTHER): Payer: Medicare HMO | Admitting: Physician Assistant

## 2021-08-29 ENCOUNTER — Encounter: Payer: Self-pay | Admitting: Physician Assistant

## 2021-08-29 VITALS — BP 118/64 | HR 64 | Temp 97.0°F | Ht 63.5 in | Wt 135.0 lb

## 2021-08-29 DIAGNOSIS — H9192 Unspecified hearing loss, left ear: Secondary | ICD-10-CM | POA: Diagnosis not present

## 2021-08-29 DIAGNOSIS — E785 Hyperlipidemia, unspecified: Secondary | ICD-10-CM | POA: Diagnosis not present

## 2021-08-29 DIAGNOSIS — K5909 Other constipation: Secondary | ICD-10-CM

## 2021-08-29 NOTE — Assessment & Plan Note (Signed)
-  Reassurance provided no cerumen impaction of either ear. Recommend new audiometry. Advised to let me know if needs a referral to her preferred location. Pt verbalized understanding.

## 2021-08-29 NOTE — Assessment & Plan Note (Signed)
-  Recommend to continue magnesium supplement if providing benefits for constipation. Discussed high fiber diet.

## 2021-08-29 NOTE — Patient Instructions (Signed)

## 2021-08-29 NOTE — Assessment & Plan Note (Signed)
-  Discussed with patient risk vs benefits of statin therapy to reduce cardiovascular risk and given age reasonable to consider stopping statin therapy if experiencing side effects. Discussed to continue a heart healthy diet and will continue to monitor lipid panel. Will obtain baseline lipid. Continue to stay active with walking.

## 2021-08-30 LAB — COMPREHENSIVE METABOLIC PANEL
ALT: 15 IU/L (ref 0–32)
AST: 22 IU/L (ref 0–40)
Albumin/Globulin Ratio: 2.6 — ABNORMAL HIGH (ref 1.2–2.2)
Albumin: 4.5 g/dL (ref 3.6–4.6)
Alkaline Phosphatase: 102 IU/L (ref 44–121)
BUN/Creatinine Ratio: 21 (ref 12–28)
BUN: 16 mg/dL (ref 8–27)
Bilirubin Total: 0.6 mg/dL (ref 0.0–1.2)
CO2: 23 mmol/L (ref 20–29)
Calcium: 9.7 mg/dL (ref 8.7–10.3)
Chloride: 104 mmol/L (ref 96–106)
Creatinine, Ser: 0.75 mg/dL (ref 0.57–1.00)
Globulin, Total: 1.7 g/dL (ref 1.5–4.5)
Glucose: 91 mg/dL (ref 70–99)
Potassium: 4.6 mmol/L (ref 3.5–5.2)
Sodium: 142 mmol/L (ref 134–144)
Total Protein: 6.2 g/dL (ref 6.0–8.5)
eGFR: 77 mL/min/{1.73_m2} (ref >59)

## 2021-08-30 LAB — LIPID PANEL
Chol/HDL Ratio: 2.6 ratio (ref 0.0–4.4)
Cholesterol, Total: 204 mg/dL — ABNORMAL HIGH (ref 100–199)
HDL: 79 mg/dL (ref >39)
LDL Chol Calc (NIH): 109 mg/dL — ABNORMAL HIGH (ref 0–99)
Triglycerides: 93 mg/dL (ref 0–149)
VLDL Cholesterol Cal: 16 mg/dL (ref 5–40)

## 2021-09-26 ENCOUNTER — Ambulatory Visit (INDEPENDENT_AMBULATORY_CARE_PROVIDER_SITE_OTHER): Payer: Medicare HMO | Admitting: Nurse Practitioner

## 2021-09-26 ENCOUNTER — Encounter: Payer: Self-pay | Admitting: Nurse Practitioner

## 2021-09-26 ENCOUNTER — Other Ambulatory Visit: Payer: Self-pay

## 2021-09-26 VITALS — BP 117/77 | HR 61 | Temp 97.6°F | Ht 63.5 in | Wt 133.1 lb

## 2021-09-26 DIAGNOSIS — R42 Dizziness and giddiness: Secondary | ICD-10-CM | POA: Diagnosis not present

## 2021-09-26 DIAGNOSIS — G4452 New daily persistent headache (NDPH): Secondary | ICD-10-CM | POA: Diagnosis not present

## 2021-09-26 NOTE — Progress Notes (Signed)
Established patient visit ? ? ?Patient: Cheryl Martin   DOB: Jun 17, 1931   [name]  MRN: [id number] ?Visit Date: 09/26/2021 ? ?Chief Complaint  ?Patient presents with  ? Headache  ? ?Subjective  ?  ?Headache  ?This is a chronic problem. The current episode started more than 1 month ago. The problem occurs constantly. The problem has been gradually worsening. The pain is located in the Occipital and left unilateral region. The pain does not radiate. The quality of the pain is described as squeezing (describes the headaches as pressure.). Associated symptoms include back pain and dizziness. Pertinent negatives include no abdominal pain, coughing, drainage, ear pain, eye pain, eye redness, eye watering, fever, nausea, neck pain, phonophobia, photophobia, rhinorrhea, sinus pressure, sore throat, tinnitus, visual change, vomiting or weakness. The symptoms are aggravated by unknown (seemsto get worse with reading, watching TV, using her phone, and sewing. nothing is bad enough to make her stop doing thise activities.). She has tried cold packs and NSAIDs for the symptoms. The treatment provided mild relief.   ? ? ?Medications: ?Outpatient Medications Prior to Visit  ?Medication Sig  ? acetaminophen (TYLENOL) 500 MG tablet Take 1,000 mg by mouth every 6 (six) hours as needed for moderate pain or headache.  ? ASPIRIN LOW DOSE 81 MG EC tablet TAKE 1 TABLET BY MOUTH EVERY DAY  ? Bioflavonoid Products (VITAMIN C) CHEW Chew 1 tablet by mouth daily.  ? BIOTIN PO Take 1 tablet by mouth daily.  ? Cholecalciferol (VITAMIN D-3 PO) Take 1 capsule by mouth daily with breakfast.  ? Cyanocobalamin (B-12) 1000 MCG SUBL Place 2,000 mcg under the tongue daily.  ? Melatonin 3 MG CAPS Take 3 mg by mouth at bedtime as needed (sleep).  ? Multiple Vitamin (MULTIVITAMIN WITH MINERALS) TABS tablet Take 1 tablet by mouth daily.  ? nitroGLYCERIN (NITROSTAT) 0.4 MG SL tablet Place 1 tablet (0.4 mg total) under the tongue every 5 (five)  minutes as needed for chest pain.  ? polyethylene glycol (MIRALAX / GLYCOLAX) 17 g packet Take 17 g by mouth every other day.  ? rosuvastatin (CRESTOR) 10 MG tablet TAKE 1 TABLET BY MOUTH EVERYDAY AT BEDTIME  ? Sodium Chloride, Hypertonic, (MURO 128 OP) Place 1 drop into both eyes See admin instructions. Instill 1 drop into each eye in the morning, then as needed daily for dryness/irritation  ? UNKNOWN TO PATIENT OTC liquid for dry mouth  ? ?No facility-administered medications prior to visit.  ? ? ?Review of Systems  ?Constitutional:  Negative for activity change, appetite change, chills, fatigue and fever.  ?HENT:  Negative for congestion, ear pain, postnasal drip, rhinorrhea, sinus pressure, sinus pain, sneezing, sore throat and tinnitus.   ?Eyes: Negative.  Negative for photophobia, pain and redness.  ?Respiratory:  Negative for cough, chest tightness, shortness of breath and wheezing.   ?Cardiovascular:  Negative for chest pain and palpitations.  ?Gastrointestinal:  Negative for abdominal pain, constipation, diarrhea, nausea and vomiting.  ?Endocrine: Negative for cold intolerance, heat intolerance, polydipsia and polyuria.  ?Genitourinary:  Negative for dyspareunia, dysuria, flank pain, frequency and urgency.  ?Musculoskeletal:  Positive for back pain and myalgias. Negative for arthralgias and neck pain.  ?     Patient states that she has sciatica and scoliosis. She is now seeing orthopedics provider for this.   ?Skin:  Negative for rash.  ?Allergic/Immunologic: Negative for environmental allergies.  ?Neurological:  Positive for dizziness and headaches. Negative for weakness.  ?     The  patient is now having headaches nearly every day. Feels like pressure in both sides of the occipital aspect of the head.   ?Hematological:  Negative for adenopathy.  ?Psychiatric/Behavioral:  The patient is not nervous/anxious.   ? ? Objective  ?  ? ?Today's Vitals  ? 09/26/21 1118  ?BP: 117/77  ?Pulse: 61  ?Temp: 97.6 ?F (36.4  ?C)  ?SpO2: 97%  ?Weight: 133 lb 1.9 oz (60.4 kg)  ?Height: 5' 3.5" (1.613 m)  ? ?Body mass index is 23.21 kg/m?.  ? ?Physical Exam ?Vitals and nursing note reviewed.  ?Constitutional:   ?   Appearance: Normal appearance. She is well-developed.  ?HENT:  ?   Head: Normocephalic and atraumatic.  ?Eyes:  ?   Pupils: Pupils are equal, round, and reactive to light.  ?Cardiovascular:  ?   Rate and Rhythm: Normal rate and regular rhythm.  ?   Pulses: Normal pulses.  ?   Heart sounds: Normal heart sounds.  ?Pulmonary:  ?   Effort: Pulmonary effort is normal.  ?   Breath sounds: Normal breath sounds.  ?Abdominal:  ?   Palpations: Abdomen is soft.  ?Musculoskeletal:     ?   General: Normal range of motion.  ?   Cervical back: Normal range of motion and neck supple.  ?Lymphadenopathy:  ?   Cervical: No cervical adenopathy.  ?Skin: ?   General: Skin is warm and dry.  ?   Capillary Refill: Capillary refill takes less than 2 seconds.  ?Neurological:  ?   General: No focal deficit present.  ?   Mental Status: She is alert and oriented to person, place, and time.  ?   GCS: GCS eye subscore is 4. GCS verbal subscore is 5. GCS motor subscore is 6.  ?   Cranial Nerves: No cranial nerve deficit or facial asymmetry.  ?   Sensory: No sensory deficit.  ?   Motor: No weakness.  ?   Coordination: Coordination normal.  ?   Gait: Gait normal.  ?Psychiatric:     ?   Mood and Affect: Mood normal.     ?   Behavior: Behavior normal.     ?   Thought Content: Thought content normal.     ?   Judgment: Judgment normal.  ?  ? ? Assessment & Plan  ?  ? ?1. New daily persistent headache ?Headaches are now daily with moderate to severe pressure in both sides of the occipital region of the head. Having some episodes of dizziness associated with them. Recommend she take tylenol or ibuprofen as needed and as indicated to relieve pain as this has helped her in the past. Will get MRI of the brain for further evaluation.  ?- MR Brain Wo Contrast; Future ? ?2.  Dizziness ?This is new symptom that has been associated with headaches at times. Will get MRI of brain for further evaluation.  ?- MR Brain Wo Contrast; Future  ? ?Return for prn worsening or persistent symptoms.  ?   ? ? ? ?Ronnell Freshwater, NP  ?[facility] ?[phone] (phone) ?[phone] (fax) ? ? Medical [facility] ?

## 2021-10-11 ENCOUNTER — Ambulatory Visit
Admission: RE | Admit: 2021-10-11 | Discharge: 2021-10-11 | Disposition: A | Discharge status: Home / Self Care | Payer: Medicare HMO | Source: Ambulatory Visit | Attending: Nurse Practitioner | Admitting: Nurse Practitioner

## 2021-10-11 ENCOUNTER — Other Ambulatory Visit: Payer: Self-pay

## 2021-10-11 DIAGNOSIS — G4452 New daily persistent headache (NDPH): Secondary | ICD-10-CM

## 2021-10-11 DIAGNOSIS — R519 Headache, unspecified: Secondary | ICD-10-CM | POA: Diagnosis not present

## 2021-10-11 DIAGNOSIS — R42 Dizziness and giddiness: Secondary | ICD-10-CM

## 2021-10-12 DIAGNOSIS — M4180 Other forms of scoliosis, site unspecified: Secondary | ICD-10-CM | POA: Diagnosis not present

## 2021-10-15 ENCOUNTER — Telehealth: Payer: Self-pay | Admitting: Physician Assistant

## 2021-10-15 NOTE — Telephone Encounter (Signed)
Yes. I have reviewed her MRI. There are no acute findings or abnormalities. Showing moderate and chronic small vessel disease and ischemia which is stable from the last MRI she had. With the development of the new, daily headaches, I do recommend she be referred to neurologist. If she is agreeable, I will place that referral. Thanks.

## 2021-10-15 NOTE — Telephone Encounter (Signed)
Called the patient to discuss results of MRI. She gave verbal understanding of results and asked about why her nose was constantly running. I advised to take a daily antihistamine to help with symptoms. She also would like to hold off on the neurology referral until her back issues are resolved by [facility].  ?

## 2021-10-15 NOTE — Telephone Encounter (Signed)
Patient left vm asking for someone to return her call about her MRI. Please advise. [phone] ?

## 2021-10-15 NOTE — Progress Notes (Signed)
There are no acute findings or abnormalities. Showing moderate and chronic small vessel disease and ischemia which is stable from the last MRI she had. With the development of the new, daily headaches, I do recommend she be referred to neurologist.

## 2021-11-11 DIAGNOSIS — M5459 Other low back pain: Secondary | ICD-10-CM | POA: Diagnosis not present

## 2021-11-15 ENCOUNTER — Ambulatory Visit (HOSPITAL_BASED_OUTPATIENT_CLINIC_OR_DEPARTMENT_OTHER): Payer: Medicare HMO | Attending: Orthopedic Surgery | Admitting: Physical Therapy

## 2021-11-15 DIAGNOSIS — M5459 Other low back pain: Secondary | ICD-10-CM | POA: Insufficient documentation

## 2021-11-15 DIAGNOSIS — M62830 Muscle spasm of back: Secondary | ICD-10-CM | POA: Insufficient documentation

## 2021-11-15 DIAGNOSIS — M4180 Other forms of scoliosis, site unspecified: Secondary | ICD-10-CM | POA: Diagnosis not present
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
## 2021-11-16 ENCOUNTER — Encounter (HOSPITAL_BASED_OUTPATIENT_CLINIC_OR_DEPARTMENT_OTHER): Payer: Self-pay | Admitting: Physical Therapy

## 2021-11-19 DIAGNOSIS — M5451 Vertebrogenic low back pain: Secondary | ICD-10-CM | POA: Diagnosis not present

## 2021-11-21 ENCOUNTER — Encounter (HOSPITAL_BASED_OUTPATIENT_CLINIC_OR_DEPARTMENT_OTHER): Payer: Self-pay | Admitting: Physical Therapy

## 2021-11-21 ENCOUNTER — Ambulatory Visit (HOSPITAL_BASED_OUTPATIENT_CLINIC_OR_DEPARTMENT_OTHER): Payer: Medicare HMO | Attending: Orthopedic Surgery | Admitting: Physical Therapy

## 2021-11-21 DIAGNOSIS — M62830 Muscle spasm of back: Secondary | ICD-10-CM | POA: Diagnosis not present

## 2021-11-21 DIAGNOSIS — M5459 Other low back pain: Secondary | ICD-10-CM | POA: Insufficient documentation
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
## 2021-11-24 ENCOUNTER — Emergency Department (HOSPITAL_COMMUNITY)
Admission: EM | Admit: 2021-11-24 | Discharge: 2021-11-24 | Disposition: A | Discharge status: Home / Self Care | Payer: Medicare HMO | Attending: Emergency Medicine | Admitting: Emergency Medicine

## 2021-11-24 ENCOUNTER — Emergency Department (HOSPITAL_COMMUNITY): Payer: Medicare HMO

## 2021-11-24 DIAGNOSIS — Z743 Need for continuous supervision: Secondary | ICD-10-CM | POA: Diagnosis not present

## 2021-11-24 DIAGNOSIS — R110 Nausea: Secondary | ICD-10-CM | POA: Diagnosis not present

## 2021-11-24 DIAGNOSIS — R519 Headache, unspecified: Secondary | ICD-10-CM | POA: Diagnosis not present

## 2021-11-24 DIAGNOSIS — G4489 Other headache syndrome: Secondary | ICD-10-CM | POA: Diagnosis not present

## 2021-11-24 DIAGNOSIS — I6782 Cerebral ischemia: Secondary | ICD-10-CM | POA: Diagnosis not present

## 2021-11-24 DIAGNOSIS — J189 Pneumonia, unspecified organism: Secondary | ICD-10-CM | POA: Diagnosis not present

## 2021-11-24 DIAGNOSIS — G43909 Migraine, unspecified, not intractable, without status migrainosus: Secondary | ICD-10-CM | POA: Diagnosis not present

## 2021-11-24 DIAGNOSIS — Z7982 Long term (current) use of aspirin: Secondary | ICD-10-CM | POA: Insufficient documentation

## 2021-11-24 DIAGNOSIS — I10 Essential (primary) hypertension: Secondary | ICD-10-CM | POA: Diagnosis not present

## 2021-11-24 DIAGNOSIS — R6883 Chills (without fever): Secondary | ICD-10-CM | POA: Diagnosis not present

## 2021-11-24 LAB — I-STAT CHEM 8, ED
BUN: 11 mg/dL (ref 8–23)
Calcium, Ion: 1.12 mmol/L — ABNORMAL LOW (ref 1.15–1.40)
Chloride: 101 mmol/L (ref 98–111)
Creatinine, Ser: 0.6 mg/dL (ref 0.44–1.00)
Glucose, Bld: 106 mg/dL — ABNORMAL HIGH (ref 70–99)
HCT: 39 % (ref 36.0–46.0)
Hemoglobin: 13.3 g/dL (ref 12.0–15.0)
Potassium: 4 mmol/L (ref 3.5–5.1)
Sodium: 133 mmol/L — ABNORMAL LOW (ref 135–145)
TCO2: 23 mmol/L (ref 22–32)

## 2021-11-24 MED ORDER — METOCLOPRAMIDE HCL 5 MG/ML IJ SOLN
10.0000 mg | Freq: Once | INTRAMUSCULAR | Status: AC
  Administered 2021-11-24: 10 mg via INTRAVENOUS
  Filled 2021-11-24: qty 2

## 2021-11-24 MED ORDER — FENTANYL CITRATE PF 50 MCG/ML IJ SOSY
50.0000 ug | PREFILLED_SYRINGE | Freq: Once | INTRAMUSCULAR | Status: AC
  Administered 2021-11-24: 50 ug via INTRAVENOUS
  Filled 2021-11-24: qty 1

## 2021-11-24 MED ORDER — DIPHENHYDRAMINE HCL 50 MG/ML IJ SOLN
12.5000 mg | Freq: Once | INTRAMUSCULAR | Status: AC
  Administered 2021-11-24: 12.5 mg via INTRAVENOUS
  Filled 2021-11-24: qty 1

## 2021-11-24 NOTE — ED Notes (Signed)
Patient transported to CT 

## 2021-11-24 NOTE — ED Provider Notes (Signed)
?[facility] DEPT ?Provider Note ? ? ?CSN: [id number] ?Arrival date & time: 11/24/21  1324 ? ?  ? ?History ? ?Chief Complaint  ?Patient presents with  ? Headache  ? URI  ? ? ?Cheryl Martin is a 88 y.o. female. ? ?Patient is a 88 year old female who presents with headache.  She describes a 2 to 3-day history of pain in the back of her head that radiates a little bit around her head to the top.  She has associated photophobia and nausea.  No vomiting.  No fevers.  No associated neck pain or stiffness.  No vision changes.  No speech deficits.  No recent trauma.  No numbness or weakness to her extremities.  She says she has had similar migrainous type headaches in the past.  She always knows when it is coming on when she starts having splotches in her eyes and photophobia.  She has been having these headaches for about 5 to 6 years.  It is always in the back of her head.  She says normally she will take some Tylenol that eases it off but this time its lasted longer and been more intense than it normally is.  However it is the same type of pain that she normally has.  She was seen about 2 months ago by her PCP for these type of headaches.  She had an MRI of her brain which showed no acute abnormality.  She does have some back issues including spinal stenosis for which she is getting physical therapy for.  No change in the symptoms.  She has had some recent allergy symptoms which were triggered about a week ago by a dust cloud where she was having a sidewalk laid.  The dust came into the house and triggered her allergy symptoms.  She is not sure if this is what triggered her migraine.  She says her allergy symptoms are better.  No other recent illnesses. ? ? ?  ? ?Home Medications ?Prior to Admission medications   ?Medication Sig Start Date End Date Taking? Authorizing Provider  ?acetaminophen (TYLENOL) 500 MG tablet Take 1,000 mg by mouth every 6 (six) hours as needed for moderate pain or  headache.    [provider]  ?ASPIRIN LOW DOSE 81 MG EC tablet TAKE 1 TABLET BY MOUTH EVERY DAY 01/29/21   Lorrene Reid, PA-C  ?Bioflavonoid Products (VITAMIN C) CHEW Chew 1 tablet by mouth daily.    [provider]  ?BIOTIN PO Take 1 tablet by mouth daily.    [provider]  ?Cholecalciferol (VITAMIN D-3 PO) Take 1 capsule by mouth daily with breakfast.    [provider]  ?Cyanocobalamin (B-12) 1000 MCG SUBL Place 2,000 mcg under the tongue daily.    [provider]  ?Melatonin 3 MG CAPS Take 3 mg by mouth at bedtime as needed (sleep).    [provider]  ?Multiple Vitamin (MULTIVITAMIN WITH MINERALS) TABS tablet Take 1 tablet by mouth daily.    [provider]  ?nitroGLYCERIN (NITROSTAT) 0.4 MG SL tablet Place 1 tablet (0.4 mg total) under the tongue every 5 (five) minutes as needed for chest pain. 10/22/18 03/01/20  Dorothy Spark, MD  ?polyethylene glycol (MIRALAX / GLYCOLAX) 17 g packet Take 17 g by mouth every other day.    [provider]  ?rosuvastatin (CRESTOR) 10 MG tablet TAKE 1 TABLET BY MOUTH EVERYDAY AT BEDTIME 10/25/20   Imogene Burn, PA-C  ?Sodium Chloride, Hypertonic, (MURO  128 OP) Place 1 drop into both eyes See admin instructions. Instill 1 drop into each eye in the morning, then as needed daily for dryness/irritation    [provider]  ?UNKNOWN TO PATIENT OTC liquid for dry mouth    [provider]  ?   ? ?Allergies    ?Oxybutynin, Tape, Flexeril [cyclobenzaprine], Mold extract [trichophyton], and Pollen extract   ? ?Review of Systems   ?Review of Systems  ?Constitutional:  Negative for chills, diaphoresis, fatigue and fever.  ?HENT:  Negative for congestion, rhinorrhea and sneezing.   ?Eyes:  Positive for photophobia. Negative for visual disturbance.  ?Respiratory:  Negative for cough, chest tightness and shortness of breath.   ?Cardiovascular:  Negative for chest pain and leg swelling.   ?Gastrointestinal:  Negative for abdominal pain, blood in stool, diarrhea, nausea and vomiting.  ?Genitourinary:  Negative for difficulty urinating, flank pain, frequency and hematuria.  ?Musculoskeletal:  Negative for arthralgias and back pain.  ?Skin:  Negative for rash.  ?Neurological:  Positive for headaches. Negative for dizziness, speech difficulty, weakness and numbness.  ? ?Physical Exam ?Updated Vital Signs ?BP 124/65   Pulse 86   Temp 99.4 ?F (37.4 ?C) (Oral)   Resp 17   Ht '5\' 2"'$  (1.575 m)   Wt 59 kg   SpO2 96%   BMI 23.78 kg/m?  ?Physical Exam ?Constitutional:   ?   Appearance: She is well-developed.  ?HENT:  ?   Head: Normocephalic and atraumatic.  ?Eyes:  ?   General: No visual field deficit. ?   Pupils: Pupils are equal, round, and reactive to light.  ?Cardiovascular:  ?   Rate and Rhythm: Normal rate and regular rhythm.  ?   Heart sounds: Normal heart sounds.  ?Pulmonary:  ?   Effort: Pulmonary effort is normal. No respiratory distress.  ?   Breath sounds: Normal breath sounds. No wheezing or rales.  ?Chest:  ?   Chest wall: No tenderness.  ?Abdominal:  ?   General: Bowel sounds are normal.  ?   Palpations: Abdomen is soft.  ?   Tenderness: There is no abdominal tenderness. There is no guarding or rebound.  ?Musculoskeletal:     ?   General: Normal range of motion.  ?   Cervical back: Normal range of motion and neck supple.  ?Lymphadenopathy:  ?   Cervical: No cervical adenopathy.  ?Skin: ?   General: Skin is warm and dry.  ?   Findings: No rash.  ?Neurological:  ?   Mental Status: She is alert and oriented to person, place, and time.  ?   GCS: GCS eye subscore is 4. GCS verbal subscore is 5. GCS motor subscore is 6.  ?   Cranial Nerves: No cranial nerve deficit, dysarthria or facial asymmetry.  ?   Sensory: No sensory deficit.  ?   Motor: No weakness.  ?   Coordination: Coordination normal.  ? ? ?ED Results / Procedures / Treatments   ?Labs ?(all labs ordered are listed, but only abnormal  results are displayed) ?Labs Reviewed  ?I-STAT CHEM 8, ED - Abnormal; Notable for the following components:  ?    Result Value  ? Sodium 133 (*)   ? Glucose, Bld 106 (*)   ? Calcium, Ion 1.12 (*)   ? All other components within normal limits  ? ? ?EKG ?None ? ?Radiology ?CT Head Wo Contrast ? ?Result Date: 11/24/2021 ?CLINICAL DATA:  Ongoing migraine over the past week. Chills  with congestion 1 week. EXAM: CT HEAD WITHOUT CONTRAST TECHNIQUE: Contiguous axial images were obtained from the base of the skull through the vertex without intravenous contrast. RADIATION DOSE REDUCTION: This exam was performed according to the departmental dose-optimization program which includes automated exposure control, adjustment of the mA and/or kV according to patient size and/or use of iterative reconstruction technique. COMPARISON:  07/25/2018 FINDINGS: Brain: Ventricles, cisterns and other CSF spaces are normal. There is mild chronic ischemic microvascular disease. There is no mass, mass effect, shift of midline structures or acute hemorrhage. No evidence of acute infarction. Vascular: No hyperdense vessel or unexpected calcification. Skull: Normal. Negative for fracture or focal lesion. Sinuses/Orbits: No acute finding. Other: None. IMPRESSION: 1. No acute findings. 2. Mild chronic ischemic microvascular disease. Electronically Signed   By: Marin Olp M.D.   On: 11/24/2021 14:43   ? ?Procedures ?Procedures  ? ? ?Medications Ordered in ED ?Medications  ?metoCLOPramide (REGLAN) injection 10 mg (10 mg Intravenous Given 11/24/21 1404)  ?diphenhydrAMINE (BENADRYL) injection 12.5 mg (12.5 mg Intravenous Given 11/24/21 1403)  ?fentaNYL (SUBLIMAZE) injection 50 mcg (50 mcg Intravenous Given 11/24/21 1708)  ? ? ?ED Course/ Medical Decision Making/ A&P ?  ?                        ?Medical Decision Making ?Amount and/or Complexity of Data Reviewed ?Radiology: ordered. ? ?Risk ?Prescription drug management. ? ? ?Patient is a 88 year old female  who presents with a headache.  She said she has had similar type headaches but not this bad.  She had a recent MRI a which I reviewed and showed no acute abnormality.  This was done about 2 months ago for similar occipital t

## 2021-11-24 NOTE — ED Notes (Signed)
Pt d/c home with daughter per MD order. Discharge summary reviewed with pt, pt verbalizes understanding. Off unit via WC. No s/s of acute distress noted at discharge.  ?

## 2021-11-24 NOTE — ED Triage Notes (Signed)
Pt to ED via EMS from home c/o migraine ongoing for the past week, constant in nature- reports she has been without her glasses for almost 2 weeks. Also c/o chills and congestion for 1 week, afebrile. Reports was on abx x 3 days with no relief. Last VS:  148/72, 76 hr, 20 RR, 100% 109 CBG.  No medications given by EMS.  ?

## 2021-11-26 ENCOUNTER — Observation Stay (HOSPITAL_COMMUNITY)
Admission: EM | Admit: 2021-11-26 | Discharge: 2021-11-27 | Disposition: A | Discharge status: Home / Self Care | Payer: Medicare HMO | Attending: Internal Medicine | Admitting: Internal Medicine

## 2021-11-26 ENCOUNTER — Emergency Department (HOSPITAL_COMMUNITY): Payer: Medicare HMO

## 2021-11-26 ENCOUNTER — Other Ambulatory Visit: Payer: Self-pay

## 2021-11-26 ENCOUNTER — Encounter (HOSPITAL_COMMUNITY): Payer: Self-pay

## 2021-11-26 DIAGNOSIS — E871 Hypo-osmolality and hyponatremia: Secondary | ICD-10-CM | POA: Diagnosis not present

## 2021-11-26 DIAGNOSIS — I672 Cerebral atherosclerosis: Secondary | ICD-10-CM | POA: Diagnosis not present

## 2021-11-26 DIAGNOSIS — I4891 Unspecified atrial fibrillation: Secondary | ICD-10-CM | POA: Diagnosis not present

## 2021-11-26 DIAGNOSIS — R519 Headache, unspecified: Secondary | ICD-10-CM | POA: Diagnosis not present

## 2021-11-26 DIAGNOSIS — Z87891 Personal history of nicotine dependence: Secondary | ICD-10-CM | POA: Insufficient documentation

## 2021-11-26 DIAGNOSIS — G459 Transient cerebral ischemic attack, unspecified: Secondary | ICD-10-CM | POA: Diagnosis not present

## 2021-11-26 DIAGNOSIS — Z8673 Personal history of transient ischemic attack (TIA), and cerebral infarction without residual deficits: Secondary | ICD-10-CM | POA: Diagnosis not present

## 2021-11-26 DIAGNOSIS — Z20822 Contact with and (suspected) exposure to covid-19: Secondary | ICD-10-CM | POA: Insufficient documentation

## 2021-11-26 DIAGNOSIS — R4701 Aphasia: Secondary | ICD-10-CM | POA: Diagnosis present

## 2021-11-26 DIAGNOSIS — Z7982 Long term (current) use of aspirin: Secondary | ICD-10-CM | POA: Insufficient documentation

## 2021-11-26 DIAGNOSIS — E039 Hypothyroidism, unspecified: Secondary | ICD-10-CM | POA: Diagnosis not present

## 2021-11-26 DIAGNOSIS — I6503 Occlusion and stenosis of bilateral vertebral arteries: Secondary | ICD-10-CM | POA: Diagnosis not present

## 2021-11-26 DIAGNOSIS — I2510 Atherosclerotic heart disease of native coronary artery without angina pectoris: Secondary | ICD-10-CM | POA: Diagnosis not present

## 2021-11-26 DIAGNOSIS — Z96652 Presence of left artificial knee joint: Secondary | ICD-10-CM | POA: Diagnosis not present

## 2021-11-26 DIAGNOSIS — Z79899 Other long term (current) drug therapy: Secondary | ICD-10-CM | POA: Insufficient documentation

## 2021-11-26 DIAGNOSIS — Q283 Other malformations of cerebral vessels: Secondary | ICD-10-CM | POA: Diagnosis not present

## 2021-11-26 DIAGNOSIS — E785 Hyperlipidemia, unspecified: Secondary | ICD-10-CM | POA: Diagnosis not present

## 2021-11-26 DIAGNOSIS — R9431 Abnormal electrocardiogram [ECG] [EKG]: Secondary | ICD-10-CM | POA: Diagnosis not present

## 2021-11-26 DIAGNOSIS — G319 Degenerative disease of nervous system, unspecified: Secondary | ICD-10-CM | POA: Diagnosis not present

## 2021-11-26 DIAGNOSIS — I6523 Occlusion and stenosis of bilateral carotid arteries: Secondary | ICD-10-CM | POA: Diagnosis not present

## 2021-11-26 LAB — CBC
HCT: 39.3 % (ref 36.0–46.0)
Hemoglobin: 13.6 g/dL (ref 12.0–15.0)
MCH: 32.9 pg (ref 26.0–34.0)
MCHC: 34.6 g/dL (ref 30.0–36.0)
MCV: 94.9 fL (ref 80.0–100.0)
Platelets: 234 10*3/uL (ref 150–400)
RBC: 4.14 MIL/uL (ref 3.87–5.11)
RDW: 13.1 % (ref 11.5–15.5)
WBC: 5.8 10*3/uL (ref 4.0–10.5)
nRBC: 0 % (ref 0.0–0.2)

## 2021-11-26 LAB — DIFFERENTIAL
Abs Immature Granulocytes: 0.01 10*3/uL (ref 0.00–0.07)
Basophils Absolute: 0.1 10*3/uL (ref 0.0–0.1)
Basophils Relative: 1 %
Eosinophils Absolute: 0.1 10*3/uL (ref 0.0–0.5)
Eosinophils Relative: 2 %
Immature Granulocytes: 0 %
Lymphocytes Relative: 30 %
Lymphs Abs: 1.7 10*3/uL (ref 0.7–4.0)
Monocytes Absolute: 0.7 10*3/uL (ref 0.1–1.0)
Monocytes Relative: 12 %
Neutro Abs: 3.2 10*3/uL (ref 1.7–7.7)
Neutrophils Relative %: 55 %

## 2021-11-26 LAB — URINALYSIS, ROUTINE W REFLEX MICROSCOPIC
Bacteria, UA: NONE SEEN
Bilirubin Urine: NEGATIVE
Glucose, UA: NEGATIVE mg/dL
Ketones, ur: NEGATIVE mg/dL
Leukocytes,Ua: NEGATIVE
Nitrite: NEGATIVE
Protein, ur: NEGATIVE mg/dL
Specific Gravity, Urine: 1.015 (ref 1.005–1.030)
pH: 6 (ref 5.0–8.0)

## 2021-11-26 LAB — COMPREHENSIVE METABOLIC PANEL
ALT: 18 U/L (ref 0–44)
AST: 22 U/L (ref 15–41)
Albumin: 4.1 g/dL (ref 3.5–5.0)
Alkaline Phosphatase: 69 U/L (ref 38–126)
Anion gap: 9 (ref 5–15)
BUN: 19 mg/dL (ref 8–23)
CO2: 23 mmol/L (ref 22–32)
Calcium: 9.5 mg/dL (ref 8.9–10.3)
Chloride: 102 mmol/L (ref 98–111)
Creatinine, Ser: 0.66 mg/dL (ref 0.44–1.00)
GFR, Estimated: >60 mL/min (ref >60)
Glucose, Bld: 101 mg/dL — ABNORMAL HIGH (ref 70–99)
Potassium: 4 mmol/L (ref 3.5–5.1)
Sodium: 134 mmol/L — ABNORMAL LOW (ref 135–145)
Total Bilirubin: 0.6 mg/dL (ref 0.3–1.2)
Total Protein: 6.5 g/dL (ref 6.5–8.1)

## 2021-11-26 LAB — ETHANOL: Alcohol, Ethyl (B): <10 mg/dL (ref <10)

## 2021-11-26 LAB — I-STAT CHEM 8, ED
BUN: 18 mg/dL (ref 8–23)
Calcium, Ion: 1.2 mmol/L (ref 1.15–1.40)
Chloride: 99 mmol/L (ref 98–111)
Creatinine, Ser: 0.6 mg/dL (ref 0.44–1.00)
Glucose, Bld: 98 mg/dL (ref 70–99)
HCT: 35 % — ABNORMAL LOW (ref 36.0–46.0)
Hemoglobin: 11.9 g/dL — ABNORMAL LOW (ref 12.0–15.0)
Potassium: 3.9 mmol/L (ref 3.5–5.1)
Sodium: 134 mmol/L — ABNORMAL LOW (ref 135–145)
TCO2: 26 mmol/L (ref 22–32)

## 2021-11-26 LAB — RESP PANEL BY RT-PCR (FLU A&B, COVID) ARPGX2
Influenza A by PCR: NEGATIVE
Influenza B by PCR: NEGATIVE
SARS Coronavirus 2 by RT PCR: NEGATIVE

## 2021-11-26 LAB — RAPID URINE DRUG SCREEN, HOSP PERFORMED
Amphetamines: NOT DETECTED
Barbiturates: NOT DETECTED
Benzodiazepines: NOT DETECTED
Cocaine: NOT DETECTED
Opiates: NOT DETECTED
Tetrahydrocannabinol: NOT DETECTED

## 2021-11-26 MED ORDER — MELATONIN 3 MG PO TABS: 3.0000 mg | ORAL_TABLET | Freq: Every evening | ORAL | Status: DC | PRN

## 2021-11-26 MED ORDER — POLYETHYLENE GLYCOL 3350 17 G PO PACK
17.0000 g | PACK | Freq: Every day | ORAL | Status: DC | PRN
Start: 2021-11-26 — End: 2021-11-28

## 2021-11-26 MED ORDER — ACETAMINOPHEN 325 MG PO TABS: 650.0000 mg | ORAL_TABLET | Freq: Four times a day (QID) | ORAL | Status: DC | PRN

## 2021-11-26 MED ORDER — ONDANSETRON HCL 4 MG/2ML IJ SOLN: 4.0000 mg | Freq: Four times a day (QID) | INTRAMUSCULAR | Status: DC | PRN

## 2021-11-26 MED ORDER — SODIUM CHLORIDE 0.9 % IV SOLN: INTRAVENOUS | Status: DC

## 2021-11-26 MED ORDER — ROSUVASTATIN CALCIUM 20 MG PO TABS
40.0000 mg | ORAL_TABLET | Freq: Every day | ORAL | Status: DC
  Filled 2021-11-26: qty 2

## 2021-11-26 MED ORDER — FENTANYL CITRATE PF 50 MCG/ML IJ SOSY
25.0000 ug | PREFILLED_SYRINGE | Freq: Once | INTRAMUSCULAR | Status: AC
  Administered 2021-11-26: 25 ug via INTRAVENOUS
  Filled 2021-11-26: qty 1

## 2021-11-26 MED ORDER — PROCHLORPERAZINE EDISYLATE 10 MG/2ML IJ SOLN
10.0000 mg | Freq: Once | INTRAMUSCULAR | Status: AC
  Administered 2021-11-26: 10 mg via INTRAVENOUS
  Filled 2021-11-26: qty 2

## 2021-11-26 MED ORDER — CLOPIDOGREL BISULFATE 75 MG PO TABS
75.0000 mg | ORAL_TABLET | Freq: Once | ORAL | Status: AC
  Administered 2021-11-27: 75 mg via ORAL
  Filled 2021-11-26: qty 1

## 2021-11-26 MED ORDER — IOHEXOL 350 MG/ML SOLN
100.0000 mL | Freq: Once | INTRAVENOUS | Status: AC | PRN
  Administered 2021-11-26: 75 mL via INTRAVENOUS

## 2021-11-26 MED ORDER — ENOXAPARIN SODIUM 40 MG/0.4ML IJ SOSY
40.0000 mg | PREFILLED_SYRINGE | INTRAMUSCULAR | Status: DC
  Administered 2021-11-27: 40 mg via SUBCUTANEOUS
  Filled 2021-11-26: qty 0.4

## 2021-11-26 MED ORDER — DIPHENHYDRAMINE HCL 50 MG/ML IJ SOLN
25.0000 mg | Freq: Once | INTRAMUSCULAR | Status: AC
  Administered 2021-11-26: 25 mg via INTRAVENOUS
  Filled 2021-11-26: qty 1
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
## 2021-11-26 NOTE — ED Notes (Signed)
Patient transported to MRI 

## 2021-11-26 NOTE — ED Notes (Signed)
Patient being seen by Dr in triage for symptoms.  ?

## 2021-11-26 NOTE — ED Provider Notes (Signed)
?[facility] DEPT ?Provider Note ? ? ?CSN: [id number] ?Arrival date & time: 11/26/21  1929 ? ?  ? ?History ? ?No chief complaint on file. ? ? ?Cheryl Martin is a 88 y.o. female hx of scoliosis, TIA here with trouble speaking. Patient has been having headaches for 2 days. Came to the ED 2 days ago and had unremarkable CT head and given migraine cocktail. She states that she still had headache for the last 2 days. Around 6 pm tonight, she had acute onset of headache and had trouble speaking. She had no weakness or numbness. When she came to the ED, her symptoms resolved.  ? ?The history is provided by the patient.  ? ?  ? ?Home Medications ?Prior to Admission medications   ?Medication Sig Start Date End Date Taking? Authorizing Provider  ?acetaminophen (TYLENOL) 500 MG tablet Take 1,000 mg by mouth every 6 (six) hours as needed for moderate pain or headache.    [provider]  ?ASPIRIN LOW DOSE 81 MG EC tablet TAKE 1 TABLET BY MOUTH EVERY DAY 01/29/21   Lorrene Reid, PA-C  ?Bioflavonoid Products (VITAMIN C) CHEW Chew 1 tablet by mouth daily.    [provider]  ?BIOTIN PO Take 1 tablet by mouth daily.    [provider]  ?Cholecalciferol (VITAMIN D-3 PO) Take 1 capsule by mouth daily with breakfast.    [provider]  ?Cyanocobalamin (B-12) 1000 MCG SUBL Place 2,000 mcg under the tongue daily.    [provider]  ?Melatonin 3 MG CAPS Take 3 mg by mouth at bedtime as needed (sleep).    [provider]  ?Multiple Vitamin (MULTIVITAMIN WITH MINERALS) TABS tablet Take 1 tablet by mouth daily.    [provider]  ?nitroGLYCERIN (NITROSTAT) 0.4 MG SL tablet Place 1 tablet (0.4 mg total) under the tongue every 5 (five) minutes as needed for chest pain. 10/22/18 03/01/20  Dorothy Spark, MD  ?polyethylene glycol (MIRALAX / GLYCOLAX) 17 g packet Take 17 g by mouth every other day.    [provider]  ?rosuvastatin  (CRESTOR) 10 MG tablet TAKE 1 TABLET BY MOUTH EVERYDAY AT BEDTIME 10/25/20   Imogene Burn, PA-C  ?Sodium Chloride, Hypertonic, (MURO 128 OP) Place 1 drop into both eyes See admin instructions. Instill 1 drop into each eye in the morning, then as needed daily for dryness/irritation    [provider]  ?UNKNOWN TO PATIENT OTC liquid for dry mouth    [provider]  ?   ? ?Allergies    ?Oxybutynin, Tape, Flexeril [cyclobenzaprine], Mold extract [trichophyton], and Pollen extract   ? ?Review of Systems   ?Review of Systems  ?Neurological:  Positive for headaches.  ?All other systems reviewed and are negative. ? ?Physical Exam ?Updated Vital Signs ?BP (!) 168/65 (BP Location: Left Arm)   Pulse 65   Temp 97.9 ?F (36.6 ?C) (Oral)   Resp 16   SpO2 100%  ?Physical Exam ?Vitals and nursing note reviewed.  ?Constitutional:   ?   Comments: Chronically ill, slightly uncomfortable   ?HENT:  ?   Head: Normocephalic.  ?   Nose: Nose normal.  ?   Mouth/Throat:  ?   Mouth: Mucous membranes are moist.  ?Eyes:  ?   Extraocular Movements: Extraocular movements intact.  ?   Pupils: Pupils are equal, round, and reactive to light.  ?Cardiovascular:  ?   Rate and Rhythm: Normal rate and regular rhythm.  ?  Pulses: Normal pulses.  ?   Heart sounds: Normal heart sounds.  ?Pulmonary:  ?   Effort: Pulmonary effort is normal.  ?   Breath sounds: Normal breath sounds.  ?Abdominal:  ?   General: Abdomen is flat.  ?   Palpations: Abdomen is soft.  ?Musculoskeletal:     ?   General: Normal range of motion.  ?   Cervical back: Normal range of motion and neck supple.  ?Skin: ?   General: Skin is warm.  ?   Capillary Refill: Capillary refill takes less than 2 seconds.  ?Neurological:  ?   General: No focal deficit present.  ?   Mental Status: She is oriented to person, place, and time.  ?   Comments: No obvious facial droop. Patient has nl speech. Nl strength bilateral arms and legs. Nl finger to nose bilaterally.    ?Psychiatric:     ?   Mood and Affect: Mood normal.     ?   Behavior: Behavior normal.  ? ? ?ED Results / Procedures / Treatments   ?Labs ?(all labs ordered are listed, but only abnormal results are displayed) ?Labs Reviewed  ?COMPREHENSIVE METABOLIC PANEL - Abnormal; Notable for the following components:  ?    Result Value  ? Sodium 134 (*)   ? Glucose, Bld 101 (*)   ? All other components within normal limits  ?I-STAT CHEM 8, ED - Abnormal; Notable for the following components:  ? Sodium 134 (*)   ? Hemoglobin 11.9 (*)   ? HCT 35.0 (*)   ? All other components within normal limits  ?RESP PANEL BY RT-PCR (FLU A&B, COVID) [id number]  ?ETHANOL  ?CBC  ?DIFFERENTIAL  ?RAPID URINE DRUG SCREEN, HOSP PERFORMED  ?URINALYSIS, ROUTINE W REFLEX MICROSCOPIC  ? ? ?EKG ?None ? ? ?ED ECG REPORT ? ? Date: 11/26/2021 ? Rate: 63 ? Rhythm: normal sinus rhythm ? QRS Axis: normal ? Intervals: normal ? ST/T Wave abnormalities: normal ? Conduction Disutrbances:none ? Narrative Interpretation:  ? Old EKG Reviewed: unchanged ? ?I have personally reviewed the EKG tracing and agree with the computerized printout as noted. ? ?Radiology ?No results found. ? ?Procedures ?Procedures  ? ? ?Medications Ordered in ED ?Medications  ?fentaNYL (SUBLIMAZE) injection 25 mcg (has no administration in time range)  ?prochlorperazine (COMPAZINE) injection 10 mg (has no administration in time range)  ?diphenhydrAMINE (BENADRYL) injection 25 mg (has no administration in time range)  ?iohexol (OMNIPAQUE) 350 MG/ML injection 100 mL (has no administration in time range)  ? ? ?ED Course/ Medical Decision Making/ A&P ?  ?                        ?Medical Decision Making ?[name] is a 88 y.o. female here with trouble speaking. Symptoms resolved. Likely complex migraine vs TIA vs stroke. Since symptoms resolved, will not activate code stroke. Will get CTA head/neck, MRI brain. Will give migraine cocktail  ? ?11:06 PM ?MRI showed no stroke. Given that she had  clear expressive aphasia at 6 pm, I consulted Dr. Lorrin Goodell from neurology. He recommend admission for TIA workup and continue aspirin and add plavix. Hospitalist to admit.  ? ?Problems Addressed: ?TIA (transient ischemic attack): acute illness or injury ? ?Amount and/or Complexity of Data Reviewed ?Labs: ordered. Decision-making details documented in ED Course. ?Radiology: ordered and independent interpretation performed. Decision-making details documented in ED Course. ?ECG/medicine tests: ordered and independent interpretation performed. Decision-making details documented in ED  Course. ? ?Risk ?Prescription drug management. ? ? ?Final Clinical Impression(s) / ED Diagnoses ?Final diagnoses:  ?None  ? ? ?Rx / DC Orders ?ED Discharge Orders   ? ? None  ? ?  ? ? ?  ?Drenda Freeze, MD ?11/26/21 2307 ? ?

## 2021-11-26 NOTE — ED Triage Notes (Addendum)
Patient arrives with daughter. Daughter states when she arrived from work her mother could not form words. Sentences were garbled and distorted at 6:00pm. LKN was 0700 this morning. Patient also reports headache and nausea.  ?

## 2021-11-27 ENCOUNTER — Ambulatory Visit: Payer: Medicare HMO | Admitting: Physician Assistant

## 2021-11-27 ENCOUNTER — Observation Stay (HOSPITAL_BASED_OUTPATIENT_CLINIC_OR_DEPARTMENT_OTHER): Payer: Medicare HMO

## 2021-11-27 DIAGNOSIS — G459 Transient cerebral ischemic attack, unspecified: Secondary | ICD-10-CM | POA: Diagnosis not present

## 2021-11-27 LAB — BASIC METABOLIC PANEL
Anion gap: 6 (ref 5–15)
BUN: 15 mg/dL (ref 8–23)
CO2: 24 mmol/L (ref 22–32)
Calcium: 9.3 mg/dL (ref 8.9–10.3)
Chloride: 104 mmol/L (ref 98–111)
Creatinine, Ser: 0.67 mg/dL (ref 0.44–1.00)
GFR, Estimated: >60 mL/min (ref >60)
Glucose, Bld: 94 mg/dL (ref 70–99)
Potassium: 3.9 mmol/L (ref 3.5–5.1)
Sodium: 134 mmol/L — ABNORMAL LOW (ref 135–145)

## 2021-11-27 LAB — CBC
HCT: 36.4 % (ref 36.0–46.0)
Hemoglobin: 12.3 g/dL (ref 12.0–15.0)
MCH: 31.5 pg (ref 26.0–34.0)
MCHC: 33.8 g/dL (ref 30.0–36.0)
MCV: 93.1 fL (ref 80.0–100.0)
Platelets: 188 10*3/uL (ref 150–400)
RBC: 3.91 MIL/uL (ref 3.87–5.11)
RDW: 13.1 % (ref 11.5–15.5)
WBC: 4.8 10*3/uL (ref 4.0–10.5)
nRBC: 0 % (ref 0.0–0.2)

## 2021-11-27 LAB — LIPID PANEL
Cholesterol: 199 mg/dL (ref 0–200)
HDL: 63 mg/dL (ref >40)
LDL Cholesterol: 116 mg/dL — ABNORMAL HIGH (ref 0–99)
Total CHOL/HDL Ratio: 3.2 RATIO
Triglycerides: 98 mg/dL (ref <150)
VLDL: 20 mg/dL (ref 0–40)

## 2021-11-27 LAB — ECHOCARDIOGRAM COMPLETE BUBBLE STUDY
Area-P 1/2: 2.29 cm2
S' Lateral: 2.3 cm

## 2021-11-27 LAB — HEMOGLOBIN A1C
Hgb A1c MFr Bld: 5 % (ref 4.8–5.6)
Mean Plasma Glucose: 96.8 mg/dL

## 2021-11-27 LAB — MAGNESIUM: Magnesium: 2.1 mg/dL (ref 1.7–2.4)

## 2021-11-27 LAB — PHOSPHORUS: Phosphorus: 3.3 mg/dL (ref 2.5–4.6)

## 2021-11-27 MED ORDER — CLOPIDOGREL BISULFATE 75 MG PO TABS
75.0000 mg | ORAL_TABLET | Freq: Every day | ORAL | Status: DC
  Administered 2021-11-27: 75 mg via ORAL
  Filled 2021-11-27: qty 1

## 2021-11-27 MED ORDER — ASPIRIN EC 81 MG PO TBEC
81.0000 mg | DELAYED_RELEASE_TABLET | Freq: Every day | ORAL | Status: DC
  Administered 2021-11-27: 81 mg via ORAL
  Filled 2021-11-27: qty 1

## 2021-11-27 MED ORDER — SODIUM CHLORIDE 0.9% FLUSH
10.0000 mL | Freq: Once | INTRAVENOUS | Status: AC
Start: 2021-11-27 — End: 2021-11-27
  Administered 2021-11-27: 10 mL via INTRAVENOUS

## 2021-11-27 MED ORDER — CLOPIDOGREL BISULFATE 75 MG PO TABS: 75.0000 mg | ORAL_TABLET | Freq: Every day | ORAL | 0 refills | Status: AC

## 2021-11-27 NOTE — Discharge Summary (Signed)
?Physician Discharge Summary ?  ?Patient: Cheryl Martin MRN: [id number] DOB: 1930/09/21  ?Admit date:     11/26/2021  ?Discharge date: 11/27/21  ?Discharge Physician: Marylu Lund  ? ?PCP: Lorrene Reid, PA-C  ? ?Recommendations at discharge:  ? ? Follow up with PCP in 1-2 weeks ?Follow up with Neurology as scheduled ?Recommend referral to Cardiology for amb cardiac monitoring r/o paroxsymal a fib, this can be arranged through her established outpatient cardiologist ? ?Discharge Diagnoses: ?Principal Problem: ?  TIA (transient ischemic attack) ? ?Resolved Problems: ?  * No resolved hospital problems. * ? ?Hospital Course: ?No notes on file ? ?Assessment and Plan: ?No notes have been filed under this hospital service. ?Service: Hospitalist ? ?Presumed transient ischemic attack ?Reported symptoms of difficulty getting words out earlier around 6 PM the night of presentation.  Symptoms resolved in the ED. ?Admitted for TIA work-up as recommended by neurology ?CT angio head and neck unremarkable for any significant stenosis or large vessel occlusion. ?MRI brain no acute intracranial abnormality. ?Admitted for TIA work-up as recommended by neurology/stroke team ?LDL 109 on 08/29/2021, goal LDL less than 70.   ?Increased home dose of Crestor to 40 mg daily, however pt reports not tolerating statin secondary to severe myalgias ?2d echo reviewed, unremarkable with normal LVEF ?Discussed case with Neurology. Recs for Aspirin and plavix daily x 21 days, then ASA alone. ?Neurology recommends referral for outpt cardiac monitoring with her Cardiologist ?  ?Mild hypovolemic hyponatremia ?Serum sodium 134 ?Remained stable ?  ?Hyperlipidemia ?LDL 109 on 08/29/2021 ?Goal LDL less than 70 ?Home dose of Crestor increased to 40 mg daily. ?  ? ?  ? ? ?Consultants: Neurology ?Procedures performed:   ?Disposition: Home ?Diet recommendation:  ?Cardiac diet ?DISCHARGE MEDICATION: ?Allergies as of 11/27/2021   ? ?   Reactions  ? Oxybutynin  Other (See Comments)  ? Cannot tolerate at higher doses, caused dehydration   ? Tape Other (See Comments)  ? Skin will tear with medical tape, but can tolerate paper tape only  ? Flexeril [cyclobenzaprine] Other (See Comments)  ? Caused excessive lethargy  ? Mold Extract [trichophyton] Other (See Comments)  ? Runny nose (with dust, also)  ? Pollen Extract Other (See Comments)  ? Runny nose  ? ?  ? ?  ?Medication List  ?  ? ?STOP taking these medications   ? ?rosuvastatin 10 MG tablet ?Commonly known as: CRESTOR ?  ? ?  ? ?TAKE these medications   ? ?acetaminophen 500 MG tablet ?Commonly known as: TYLENOL ?Take 1,000 mg by mouth every 6 (six) hours as needed for moderate pain or headache. ?  ?Aspirin Low Dose 81 MG EC tablet ?Generic drug: aspirin ?TAKE 1 TABLET BY MOUTH EVERY DAY ?What changed: how much to take ?  ?B-12 1000 MCG Subl ?Place 2,000 mcg under the tongue daily. ?  ?BIOTIN PO ?Take 1 tablet by mouth daily. ?  ?clopidogrel 75 MG tablet ?Commonly known as: PLAVIX ?Take 1 tablet (75 mg total) by mouth daily for 21 days. ?Start taking on: Nov 28, 2021 ?  ?Melatonin 3 MG Caps ?Take 3 mg by mouth at bedtime as needed (sleep). ?  ?multivitamin with minerals Tabs tablet ?Take 1 tablet by mouth daily. ?  ?MURO 128 OP ?Place 1 drop into both eyes See admin instructions. Instill 1 drop into each eye in the morning, then as needed daily for dryness/irritation ?  ?nitroGLYCERIN 0.4 MG SL tablet ?Commonly known as: NITROSTAT ?Place 1 tablet (0.4  mg total) under the tongue every 5 (five) minutes as needed for chest pain. ?  ?polyethylene glycol 17 g packet ?Commonly known as: MIRALAX / GLYCOLAX ?Take 17 g by mouth every other day. ?  ?Vitamin C Chew ?Chew 1 tablet by mouth daily. ?  ?VITAMIN D-3 PO ?Take 1,000 mcg by mouth daily with breakfast. ?  ? ?  ? ? Follow-up Information   ? ? Lorrene Reid, PA-C Follow up in 2 week(s).   ?Specialty: Physician Assistant ?Why: Hospital follow up ?Contact information: ?[address] ?[address] ?[place] [address] ?[phone] ? ? ?  ?  ? ? Follow up with Neurology as will be scheduled Follow up.   ?Why: Hospital follow up ? ?  ?  ? ?  ?  ? ?  ? ?Discharge Exam: ?There were no vitals filed for this visit. ?General exam: Awake, laying in bed, in nad ?Respiratory system: Normal respiratory effort, no wheezing ?Cardiovascular system: regular rate, s1, s2 ?Gastrointestinal system: Soft, nondistended, positive BS ?Central nervous system: CN2-12 grossly intact, strength intact ?Extremities: Perfused, no clubbing ?Skin: Normal skin turgor, no notable skin lesions seen ?Psychiatry: Mood normal // no visual hallucinations  ? ?Condition at discharge: fair ? ?The results of significant diagnostics from this hospitalization (including imaging, microbiology, ancillary and laboratory) are listed below for reference.  ? ?Imaging Studies: ?CT ANGIO HEAD W OR WO CONTRAST ? ?Result Date: 11/26/2021 ?CLINICAL DATA:  Initial evaluation for acute stroke. EXAM: CT ANGIOGRAPHY HEAD AND NECK TECHNIQUE: Multidetector CT imaging of the head and neck was performed using the standard protocol during bolus administration of intravenous contrast. Multiplanar CT image reconstructions and MIPs were obtained to evaluate the vascular anatomy. Carotid stenosis measurements (when applicable) are obtained utilizing NASCET criteria, using the distal internal carotid diameter as the denominator. RADIATION DOSE REDUCTION: This exam was performed according to the departmental dose-optimization program which includes automated exposure control, adjustment of the mA and/or kV according to patient size and/or use of iterative reconstruction technique. CONTRAST:  58m OMNIPAQUE IOHEXOL 350 MG/ML SOLN COMPARISON:  None Available. FINDINGS: CTA NECK FINDINGS Aortic arch: Visualized aortic arch normal in caliber with normal branch pattern. Mild-to-moderate atheromatous change about the arch and origin the great vessels without  stenosis. Right carotid system: Right common and internal carotid arteries patent without stenosis or dissection. Minimal for age atheromatous change about the proximal cervical right ICA without stenosis. Left carotid system: Left common and internal carotid arteries patent without significant stenosis or dissection. Mild for age atheromatous change about the left carotid bulb/proximal left ICA without significant stenosis. Vertebral arteries: Both vertebral arteries arise from the subclavian arteries. No significant proximal subclavian artery stenosis. Atheromatous change with associated stenosis of up to 50-60% stenosis present at the origin of the left vertebral artery (series 7, image 80). Vertebral arteries otherwise patent without stenosis or dissection. Skeleton: No discrete or worrisome osseous lesions. Mild-to-moderate multilevel degenerative spondylosis without high-grade spinal stenosis. Other neck: No other acute soft tissue abnormality within the neck. Upper chest: Irregular biapical pleuroparenchymal thickening/scarring noted at the lung apices, right greater than left. Visualized upper chest demonstrates no other acute finding. Review of the MIP images confirms the above findings CTA HEAD FINDINGS Anterior circulation: Petrous segments patent bilaterally. Atheromatous change within the carotid siphons without hemodynamically significant stenosis. A1 segments patent bilaterally. Left A1 hypoplastic. Normal anterior communicating artery complex. Anterior cerebral arteries widely patent. No M1 stenosis or occlusion. No proximal MCA branch occlusion. Distal MCA branches  perfused and symmetric. Posterior circulation: Both vertebral arteries patent without stenosis. Both PICA patent. Basilar widely patent to its distal aspect. Superior cerebellar arteries patent bilaterally. Right PCA supplied via the basilar. Fetal type origin of the left PCA. Both PCAs patent to their distal aspects without stenosis.  Venous sinuses: Patent allowing for timing the contrast bolus. Anatomic variants: As above.  No aneurysm. Review of the MIP images confirms the above findings IMPRESSION: 1. Negative CTA for large vessel occlusion

## 2021-11-27 NOTE — H&P (Signed)
Neurology telephone note ? ?Contacted by Dr. Wyline Copas about this 88 yo patient with hx CAD who presented with transient aphasia, now resolved. MRI brain showed no e/o acute infarct. CTA H&N showed no LVO, no sig intracranial stenosis, 50-60% stenosis L vert, no other significant findings. TTE unremarkable. LDL 116. A1c 5.0. ? ?Patient currently asymptomatic. Suspect this represented TIA. Given her TIA workup is complete, no indication to transfer to [facility] at this time. ? ?Recommendations: ?- OK to discharge from neuro standpoint ?- ASA '81mg'$  daily + plavix '75mg'$  daily x21 days f/b ASA '81mg'$  daily monotherapy after that ?- Continue rosuvastatin '40mg'$  daily ?- I will arrange outpatient neurology f/u ?- Recommend amb cardiac monitoring r/o paroxsymal a fib, this can be arranged through her established outpatient cardiologist ? ?Su Monks, MD ?[facility] ?[phone] ? ?If 7pm- 7am, please page neurology on call as listed in [place]. ? ? ?

## 2021-11-27 NOTE — Progress Notes (Addendum)
SLP Cancellation Note ? ?Patient Details ?Name: Cheryl Martin ?MRN: [id number] ?DOB: 12-20-1930 ? ? ?Cancelled treatment:       Reason Eval/Treat Not Completed: (P)  (Order for swallow eval received, pt passed [employer] screen.  Please order speech/language evaluation due to pt's speech deficits if indicated. Thanks.) ? ?Kathleen Lime, MS CCC SLP ?Acute Rehab Services ?Office [phone] ?Pager [phone] ? ?[name] ?11/27/2021, 9:11 AM ? ? ?

## 2021-11-27 NOTE — Care Management Obs Status (Signed)
MEDICARE OBSERVATION STATUS NOTIFICATION ? ? ?Patient Details  ?Name: Cheryl Martin ?MRN: [id number] ?Date of Birth: 09-24-1930 ? ? ?Medicare Observation Status Notification Given:  Yes ? ? ? ?Dessa Phi, RN ?11/27/2021, 3:45 PM ?

## 2021-11-27 NOTE — Hospital Course (Signed)
88 y.o. female with medical history significant for overactive bladder followed by urology, chronic constipation followed by GI, large hiatal hernia, GERD, coronary artery disease, coronary calcification followed by cardiology, who presented to [facility] ED from home due to difficulty getting her words out.  Onset around 6 PM on 11/26/2021. Associated with a recurrent headache.  No focal motor or sensory deficits.   Prior to this, she was having intermittent headaches for the past 2 days.  Presented to the ED on 11/24/21 and had a head CT without contrast which was unrevealing.  She was treated symptomatically for her headache and released home.  She continued to have headaches intermittently.  Today, the patient was brought into the ED by her daughter.  In the ED, her symptoms had resolved.  Work-up revealed no evidence of acute CVA.  CT angio head and neck as well as MRI brain were unremarkable.  EDP discussed the case with neurology/stroke team on-call Dr. Lorrin Goodell who recommended admission for TIA work-up at [facility].  The patient was admitted by the hospitalist service to [facility] telemetry medical unit, as observation status. ?

## 2021-11-27 NOTE — Progress Notes (Signed)
Occupational Therapy Evaluation ? ?Patient lives at home with daughter and is independent at baseline with self care tasks. Does not use an AD for mobility. Patient able to perform self care tasks listed below without physical assistance. Denies any other symptoms other than headache "it's much better than it was." Also reports at home her daughter would ask her something and she had trouble with word finding, this has apparently resolved as patient did not have any difficulties during evaluation. No acute OT needs at this time, will sign off. ? ? ? 11/27/21 1137  ?OT Visit Information  ?Last OT Received On 11/27/21  ?Assistance Needed +1  ?History of Present Illness 88 yo female admitted with TIA, difficulty speaking, migraine. Hx of back pain, spinal stenosis, scoliosis, CAD, NM d/o, L TKA 2018, ADD, falls, OAB  ?Precautions  ?Precautions None  ?Restrictions  ?Weight Bearing Restrictions No  ?Home Living  ?Family/patient expects to be discharged to: Private residence  ?Living Arrangements Children ?(daughter)  ?Available Help at Discharge Family;Available PRN/intermittently  ?Type of Home House  ?Home Access Stairs to enter  ?Entrance Stairs-Number of Steps 3  ?Home Layout One level  ?Bathroom Shower/Tub Walk-in shower  ?Bathroom Toilet Standard  ?Home Equipment None  ?Prior Function  ?Prior Level of Function  Independent/Modified Independent  ?Communication  ?Communication No difficulties  ?Pain Assessment  ?Pain Assessment Faces  ?Faces Pain Scale 2  ?Pain Location headache  ?Pain Descriptors / Indicators Headache  ?Pain Intervention(s) Monitored during session  ?Cognition  ?Arousal/Alertness Awake/alert  ?Behavior During Therapy [facility] for tasks assessed/performed  ?Overall Cognitive Status Within Functional Limits for tasks assessed  ?Upper Extremity Assessment  ?Upper Extremity Assessment RUE deficits/detail;LUE deficits/detail  ?RUE Deficits / Details R elbow strength 4-/5, AROM and grip intact. Denies any  parasthesia  ?LUE Deficits / Details L elbow strength 3+/5, AROM and grip grossly intact. Denies parasthesia  ?Lower Extremity Assessment  ?Lower Extremity Assessment Defer to PT evaluation  ?Cervical / Trunk Assessment  ?Cervical / Trunk Assessment Normal  ?Vision- History  ?Baseline Vision/History 1 Wears glasses  ?ADL  ?Overall ADL's  At baseline  ?General ADL Comments Patient able to pull up her socks, ambulate to/from bathroom, transfer on/off toilet, perform peri care/clothing management and wash her hands at the sink without assistance.  ?Bed Mobility  ?Overal bed mobility Modified Independent  ?Balance  ?Overall balance assessment Mild deficits observed, not formally tested  ?OT - End of Session  ?Activity Tolerance Patient tolerated treatment well  ?Patient left in bed;with call bell/phone within reach  ?Nurse Communication Mobility status  ?OT Assessment  ?OT Recommendation/Assessment Patient does not need any further OT services  ?OT Visit Diagnosis Other abnormalities of gait and mobility (R26.89)  ?OT Problem List Decreased activity tolerance  ?AM-PAC OT "6 Clicks" Daily Activity Outcome Measure (Version 2)  ?Help from another person eating meals? 4  ?Help from another person taking care of personal grooming? 4  ?Help from another person toileting, which includes using toliet, bedpan, or urinal? 4  ?Help from another person bathing (including washing, rinsing, drying)? 4  ?Help from another person to put on and taking off regular upper body clothing? 4  ?Help from another person to put on and taking off regular lower body clothing? 4  ?6 Click Score 24  ?Progressive Mobility  ?What is the highest level of mobility based on the progressive mobility assessment? Level 6 (Walks independently in room and hall) - Balance while walking in room without assist - Complete  ?  Activity Ambulated with assistance to bathroom  ?OT Recommendation  ?Follow Up Recommendations No OT follow up  ?Assistance recommended at  discharge PRN  ?Functional Status Assessent Patient has not had a recent decline in their functional status  ?OT Equipment Tub/shower seat  ?Acute Rehab OT Goals  ?Patient Stated Goal Resume aquatic therapy  ?OT Goal Formulation All assessment and education complete, DC therapy  ?OT Time Calculation  ?OT Start Time (ACUTE ONLY) 6016  ?OT Stop Time (ACUTE ONLY) 5800  ?OT Time Calculation (min) 18 min  ?OT General Charges  ?$OT Visit 1 Visit  ?OT Evaluation  ?$OT Eval Low Complexity 1 Low  ?Written Expression  ?Dominant Hand Right  ? ?Delbert Phenix OT ?OT pager: [phone] ? ?

## 2021-11-27 NOTE — Discharge Instructions (Signed)
Take both aspirin and plavix daily for 3 weeks, afterwards continue your daily aspirin ?

## 2021-11-27 NOTE — Progress Notes (Signed)
?  Echocardiogram ?2D Echocardiogram with bubble study has been performed. ? Cheryl Martin ?11/27/2021, 1:59 PM ?

## 2021-11-27 NOTE — Progress Notes (Signed)
?  Transition of Care (TOC) Screening Note ? ? ?Patient Details  ?Name: Cheryl Martin ?Date of Birth: [birth year] ? ? ?Transition of Care [facility]) CM/SW Contact:    ?Dessa Phi, RN ?Phone Number: ?11/27/2021, 3:44 PM ? ? ? ?Transition of Care Department [facility]) has reviewed patient and no TOC needs have been identified at this time. We will continue to monitor patient advancement through interdisciplinary progression rounds. If new patient transition needs arise, please place a TOC consult. ?  ?

## 2021-11-27 NOTE — Evaluation (Signed)
Physical Therapy Evaluation ?Patient Details ?Name: Cheryl Martin ?MRN: [id number] ?DOB: 27-Feb-1931 ?Today's Date: 11/27/2021 ? ?History of Present Illness ? 88 yo female admitted with TIA, difficulty speaking, migraine. Hx of back pain, spinal stenosis, scoliosis, CAD, NM d/o, L TKA 2018, ADD, falls, OAB  ?Clinical Impression ? On eval in ED, pt was Supv-Mod Ind with mobility. She walked ~75 feet around the unit without a device. No overt LOB observed. Pt denied dizziness. Will continue to follow during hospital stay.   ?   ? ?Recommendations for follow up therapy are one component of a multi-disciplinary discharge planning process, led by the attending physician.  Recommendations may be updated based on patient status, additional functional criteria and insurance authorization. ? ?Follow Up Recommendations  (resume OP PT once pt is ready to) ? ?  ?Assistance Recommended at Discharge PRN  ?Patient can return home with the following ?   ? ?  ?Equipment Recommendations None recommended by PT  ?Recommendations for Other Services ?    ?  ?Functional Status Assessment Patient has had a recent decline in their functional status and demonstrates the ability to make significant improvements in function in a reasonable and predictable amount of time.  ? ?  ?Precautions / Restrictions Restrictions ?Weight Bearing Restrictions: No  ? ?  ? ?Mobility ? Bed Mobility ?Overal bed mobility: Modified Independent ?  ?  ?  ?  ?  ?  ?  ?  ? ?Transfers ?Overall transfer level: Modified independent ?  ?  ?  ?  ?  ?  ?  ?  ?  ?  ? ?Ambulation/Gait ?Ambulation/Gait assistance: Supervision ?Gait Distance (Feet): 75 Feet ?Assistive device: None ?Gait Pattern/deviations: Step-through pattern ?  ?  ?  ?General Gait Details: No LOB. Slow, cautious gait observed. Pt denied dizziness. ? ?Stairs ?  ?  ?  ?  ?  ? ?Wheelchair Mobility ?  ? ?Modified Rankin (Stroke Patients Only) ?  ? ?  ? ?Balance Overall balance assessment: Mild deficits observed,  not formally tested ?  ?  ?  ?  ?  ?  ?  ?  ?  ?  ?  ?  ?  ?  ?  ?  ?  ?  ?   ? ? ? ?Pertinent Vitals/Pain Pain Assessment ?Pain Assessment: Faces ?Faces Pain Scale: Hurts a little bit ?Pain Location: headache ?Pain Intervention(s): Monitored during session  ? ? ?Home Living Family/patient expects to be discharged to:: Private residence ?Living Arrangements: Children (daughter) ?Available Help at Discharge: Family;Available PRN/intermittently ?Type of Home: House ?Home Access: Stairs to enter ?  ?Entrance Stairs-Number of Steps: 3 ?  ?Home Layout: One level ?Home Equipment: None ?   ?  ?Prior Function Prior Level of Function : Independent/Modified Independent ?  ?  ?  ?  ?  ?  ?  ?  ?  ? ? ?Hand Dominance  ? Dominant Hand: Right ? ?  ?Extremity/Trunk Assessment  ? Upper Extremity Assessment ?Upper Extremity Assessment: Defer to OT evaluation ?  ? ?Lower Extremity Assessment ?Lower Extremity Assessment: Overall WFL for tasks assessed ?  ? ?Cervical / Trunk Assessment ?Cervical / Trunk Assessment: Normal  ?Communication  ?    ?Cognition Arousal/Alertness: Awake/alert ?Behavior During Therapy: [facility] for tasks assessed/performed ?Overall Cognitive Status: Within Functional Limits for tasks assessed ?  ?  ?  ?  ?  ?  ?  ?  ?  ?  ?  ?  ?  ?  ?  ?  ?  ?  ?  ? ?  ?  General Comments   ? ?  ?Exercises    ? ?Assessment/Plan  ?  ?PT Assessment Patient needs continued PT services  ?PT Problem List Decreased mobility ? ?   ?  ?PT Treatment Interventions Therapeutic activities;Therapeutic exercise;Patient/family education;Functional mobility training;Gait training   ? ?PT Goals (Current goals can be found in the Care Plan section)  ?Acute Rehab PT Goals ?Patient Stated Goal: home soon ?PT Goal Formulation: With patient ?Time For Goal Achievement: 12/11/21 ?Potential to Achieve Goals: Good ? ?  ?Frequency Min 3X/week ?  ? ? ?Co-evaluation   ?  ?  ?  ?  ? ? ?  ?AM-PAC PT "6 Clicks" Mobility  ?Outcome Measure Help needed turning from  your back to your side while in a flat bed without using bedrails?: None ?Help needed moving from lying on your back to sitting on the side of a flat bed without using bedrails?: None ?Help needed moving to and from a bed to a chair (including a wheelchair)?: None ?Help needed standing up from a chair using your arms (e.g., wheelchair or bedside chair)?: None ?Help needed to walk in hospital room?: A Little ?Help needed climbing 3-5 steps with a railing? : A Little ?6 Click Score: 22 ? ?  ?End of Session Equipment Utilized During Treatment: Gait belt ?Activity Tolerance: Patient tolerated treatment well ?Patient left: in bed;with call bell/phone within reach ?  ?PT Visit Diagnosis: Unsteadiness on feet (R26.81) ?  ? ?Time: 7530-0511 ?PT Time Calculation (min) (ACUTE ONLY): 18 min ? ? ?Charges:   PT Evaluation ?$PT Eval Low Complexity: 1 Low ?  ?  ?   ? ? ? ? ? ?Ossie Beltran P, PT ?Acute Rehabilitation  ?Office: [phone] ?Pager: [phone] ? ?  ?

## 2021-11-28 ENCOUNTER — Ambulatory Visit (HOSPITAL_BASED_OUTPATIENT_CLINIC_OR_DEPARTMENT_OTHER): Payer: Medicare HMO | Admitting: Physical Therapy

## 2021-12-03 ENCOUNTER — Ambulatory Visit (INDEPENDENT_AMBULATORY_CARE_PROVIDER_SITE_OTHER): Payer: Medicare HMO | Admitting: Physician Assistant

## 2021-12-03 ENCOUNTER — Encounter: Payer: Self-pay | Admitting: Physician Assistant

## 2021-12-03 VITALS — BP 94/56 | HR 89 | Temp 97.7°F | Ht 63.5 in | Wt 129.0 lb

## 2021-12-03 DIAGNOSIS — G459 Transient cerebral ischemic attack, unspecified: Secondary | ICD-10-CM | POA: Diagnosis not present

## 2021-12-03 DIAGNOSIS — E785 Hyperlipidemia, unspecified: Secondary | ICD-10-CM

## 2021-12-03 DIAGNOSIS — Z09 Encounter for follow-up examination after completed treatment for conditions other than malignant neoplasm: Secondary | ICD-10-CM

## 2021-12-03 NOTE — Patient Instructions (Addendum)
Heart-Healthy Eating Plan Many factors influence your heart (coronary) health, including eating and exercise habits. Coronary risk increases with abnormal blood fat (lipid) levels. Heart-healthy meal planning includes limiting unhealthy fats, increasing healthy fats, and making other diet and lifestyle changes. What is my plan? Your health care provider may recommend that you: Limit your fat intake to _________% or less of your total calories each day. Limit your saturated fat intake to _________% or less of your total calories each day. Limit the amount of cholesterol in your diet to less than _________ mg per day. What are tips for following this plan? Cooking Cook foods using methods other than frying. Baking, boiling, grilling, and broiling are all good options. Other ways to reduce fat include: Removing the skin from poultry. Removing all visible fats from meats. Steaming vegetables in water or broth. Meal planning  At meals, imagine dividing your plate into fourths: Fill one-half of your plate with vegetables and green salads. Fill one-fourth of your plate with whole grains. Fill one-fourth of your plate with lean protein foods. Eat 4-5 servings of vegetables per day. One serving equals 1 cup raw or cooked vegetable, or 2 cups raw leafy greens. Eat 4-5 servings of fruit per day. One serving equals 1 medium whole fruit,  cup dried fruit,  cup fresh, frozen, or canned fruit, or  cup 100% fruit juice. Eat more foods that contain soluble fiber. Examples include apples, broccoli, carrots, beans, peas, and barley. Aim to get 25-30 g of fiber per day. Increase your consumption of legumes, nuts, and seeds to 4-5 servings per week. One serving of dried beans or legumes equals  cup cooked, 1 serving of nuts is  cup, and 1 serving of seeds equals 1 tablespoon. Fats Choose healthy fats more often. Choose monounsaturated and polyunsaturated fats, such as olive and canola oils, flaxseeds,  walnuts, almonds, and seeds. Eat more omega-3 fats. Choose salmon, mackerel, sardines, tuna, flaxseed oil, and ground flaxseeds. Aim to eat fish at least 2 times each week. Check food labels carefully to identify foods with trans fats or high amounts of saturated fat. Limit saturated fats. These are found in animal products, such as meats, butter, and cream. Plant sources of saturated fats include palm oil, palm kernel oil, and coconut oil. Avoid foods with partially hydrogenated oils in them. These contain trans fats. Examples are stick margarine, some tub margarines, cookies, crackers, and other baked goods. Avoid fried foods. General information Eat more home-cooked food and less restaurant, buffet, and fast food. Limit or avoid alcohol. Limit foods that are high in starch and sugar. Lose weight if you are overweight. Losing just 5-10% of your body weight can help your overall health and prevent diseases such as diabetes and heart disease. Monitor your salt (sodium) intake, especially if you have high blood pressure. Talk with your health care provider about your sodium intake. Try to incorporate more vegetarian meals weekly. What foods can I eat? Fruits All fresh, canned (in natural juice), or frozen fruits. Vegetables Fresh or frozen vegetables (raw, steamed, roasted, or grilled). Green salads. Grains Most grains. Choose whole wheat and whole grains most of the time. Rice and pasta, including brown rice and pastas made with whole wheat. Meats and other proteins Lean, well-trimmed beef, veal, pork, and lamb. Chicken and turkey without skin. All fish and shellfish. Wild duck, rabbit, pheasant, and venison. Egg whites or low-cholesterol egg substitutes. Dried beans, peas, lentils, and tofu. Seeds and most nuts. Dairy Low-fat or nonfat cheeses,   including ricotta and mozzarella. Skim or 1% milk (liquid, powdered, or evaporated). Buttermilk made with low-fat milk. Nonfat or low-fat  yogurt. ?Fats and oils ?Non-hydrogenated (trans-free) margarines. Vegetable oils, including soybean, sesame, sunflower, olive, peanut, safflower, corn, canola, and cottonseed. Salad dressings or mayonnaise made with a vegetable oil. ?Beverages ?Water (mineral or sparkling). Coffee and tea. Diet carbonated beverages. ?Sweets and desserts ?Sherbet, gelatin, and fruit ice. Small amounts of dark chocolate. ?Limit all sweets and desserts. ?Seasonings and condiments ?All seasonings and condiments. ?The items listed above may not be a complete list of foods and beverages you can eat. Contact a dietitian for more options. ?What foods are not recommended? ?Fruits ?Canned fruit in heavy syrup. Fruit in cream or butter sauce. Fried fruit. Limit coconut. ?Vegetables ?Vegetables cooked in cheese, cream, or butter sauce. Fried vegetables. ?Grains ?Breads made with saturated or trans fats, oils, or whole milk. Croissants. Sweet rolls. Donuts. High-fat crackers, such as cheese crackers. ?Meats and other proteins ?Fatty meats, such as hot dogs, ribs, sausage, bacon, rib-eye roast or steak. High-fat deli meats, such as salami and bologna. Caviar. Domestic duck and goose. Organ meats, such as liver. ?Dairy ?Cream, sour cream, cream cheese, and creamed cottage cheese. Whole-milk cheeses. Whole or 2% milk (liquid, evaporated, or condensed). Whole buttermilk. Cream sauce or high-fat cheese sauce. Whole-milk yogurt. ?Fats and oils ?Meat fat, or shortening. Cocoa butter, hydrogenated oils, palm oil, coconut oil, palm kernel oil. Solid fats and shortenings, including bacon fat, salt pork, lard, and butter. Nondairy cream substitutes. Salad dressings with cheese or sour cream. ?Beverages ?Regular sodas and any drinks with added sugar. ?Sweets and desserts ?Frosting. Pudding. Cookies. Cakes. Pies. Milk chocolate or white chocolate. Buttered syrups. Full-fat ice cream or ice cream drinks. ?The items listed above may not be a complete list of  foods and beverages to avoid. Contact a dietitian for more information. ?Summary ?Heart-healthy meal planning includes limiting unhealthy fats, increasing healthy fats, and making other diet and lifestyle changes. ?Lose weight if you are overweight. Losing just 5-10% of your body weight can help your overall health and prevent diseases such as diabetes and heart disease. ?Focus on eating a balance of foods, including fruits and vegetables, low-fat or nonfat dairy, lean protein, nuts and legumes, whole grains, and heart-healthy oils and fats. ?This information is not intended to replace advice given to you by your health care provider. Make sure you discuss any questions you have with your health care provider. ?Document Revised: 11/16/2020 Document Reviewed: 11/16/2020 ?Elsevier Patient Education ? [place]. ? ?Transient Ischemic Attack ?A transient ischemic attack (TIA) causes stroke-like symptoms that go away quickly. Having a TIA means that a person is at higher risk for a stroke. A TIA happens when blood supply to the brain is blocked temporarily. A TIA is a medical emergency. ?What are the causes? ?This condition is caused by a temporary blockage in an artery in the head or neck. This means the brain does not get the blood supply it needs. There is no permanent brain damage with a TIA. A blockage can be caused by: ?Fatty buildup in an artery in the head or neck (atherosclerosis). ?A blood clot. ?An artery tear (dissection). ?Inflammation of an artery (vasculitis). ?Sometimes the cause is not known. ?What increases the risk? ?Certain factors may make you more likely to develop this condition. Some of these are things that you can change, such as: ?Obesity. ?Using products that contain nicotine or tobacco. ?Taking oral birth control, especially if  you also use tobacco. ?Not being active. ?Heavy alcohol use. ?Drug use, especially cocaine and methamphetamine. ?Medical conditions that may increase your risk  include: ?High blood pressure (hypertension). ?High cholesterol. ?Diabetes. ?Heart disease (coronary artery disease). ?An irregular heartbeat, also called atrial fibrillation (AFib). ?Sickle cell disease. ?Sleep proble

## 2021-12-03 NOTE — Progress Notes (Signed)
?Established patient visit ? ? ?Patient: Cheryl Martin   DOB: [birth year]   [name]  MRN: [id number] ?Visit Date: 12/03/2021 ? ?Chief Complaint  ?Patient presents with  ? Hospitalization Follow-up  ? ?Subjective  ?  ?HPI  ?Patient presents for hospital follow-up. Patient reports tolerating Plavix and aspirin without issues. States in the past was on Lipitor which she did not tolerate due to myalgias. Reports has not been taking Crestor 40 mg. Patient reports since her hospitalization her headaches have resolved.  ? ? ?Discharge summary: ?Admit date:     11/26/2021  ?Discharge date: 11/27/21  ?Discharge Physician: Marylu Lund  ?  ?PCP: Lorrene Reid, PA-C  ?  ?Recommendations at discharge:  ?  ? Follow up with PCP in 1-2 weeks ?Follow up with Neurology as scheduled ?Recommend referral to Cardiology for amb cardiac monitoring r/o paroxsymal a fib, this can be arranged through her established outpatient cardiologist ?  ?Discharge Diagnoses: ?Principal Problem: ?  TIA (transient ischemic attack) ?  ?Resolved Problems: ?  * No resolved hospital problems. * ?  ?Hospital Course: ?No notes on file ?  ?Assessment and Plan: ?No notes have been filed under this hospital service. ?Service: Hospitalist ?  ?Presumed transient ischemic attack ?Reported symptoms of difficulty getting words out earlier around 6 PM the night of presentation.  Symptoms resolved in the ED. ?Admitted for TIA work-up as recommended by neurology ?CT angio head and neck unremarkable for any significant stenosis or large vessel occlusion. ?MRI brain no acute intracranial abnormality. ?Admitted for TIA work-up as recommended by neurology/stroke team ?LDL 109 on 08/29/2021, goal LDL less than 70.   ?Increased home dose of Crestor to 40 mg daily, however pt reports not tolerating statin secondary to severe myalgias ?2d echo reviewed, unremarkable with normal LVEF ?Discussed case with Neurology. Recs for Aspirin and plavix daily x 21 days, then ASA  alone. ?Neurology recommends referral for outpt cardiac monitoring with her Cardiologist ?  ?Mild hypovolemic hyponatremia ?Serum sodium 134 ?Remained stable ?  ?Hyperlipidemia ?LDL 109 on 08/29/2021 ?Goal LDL less than 70 ?Home dose of Crestor increased to 40 mg daily. ?  ?  ?Consultants: Neurology ?Procedures performed:   ?Disposition: Home ?Diet recommendation:  ?Cardiac diet ? ? ? ?Medications: ?Outpatient Medications Prior to Visit  ?Medication Sig  ? acetaminophen (TYLENOL) 500 MG tablet Take 1,000 mg by mouth every 6 (six) hours as needed for moderate pain or headache.  ? ASPIRIN LOW DOSE 81 MG EC tablet TAKE 1 TABLET BY MOUTH EVERY DAY (Patient taking differently: Take 81 mg by mouth daily.)  ? Bioflavonoid Products (VITAMIN C) CHEW Chew 1 tablet by mouth daily.  ? BIOTIN PO Take 1 tablet by mouth daily.  ? Cholecalciferol (VITAMIN D-3 PO) Take 1,000 mcg by mouth daily with breakfast.  ? clopidogrel (PLAVIX) 75 MG tablet Take 1 tablet (75 mg total) by mouth daily for 21 days.  ? Cyanocobalamin (B-12) 1000 MCG SUBL Place 2,000 mcg under the tongue daily.  ? Melatonin 3 MG CAPS Take 3 mg by mouth at bedtime as needed (sleep).  ? Multiple Vitamin (MULTIVITAMIN WITH MINERALS) TABS tablet Take 1 tablet by mouth daily.  ? polyethylene glycol (MIRALAX / GLYCOLAX) 17 g packet Take 17 g by mouth every other day.  ? Sodium Chloride, Hypertonic, (MURO 128 OP) Place 1 drop into both eyes See admin instructions. Instill 1 drop into each eye in the morning, then as needed daily for dryness/irritation  ? nitroGLYCERIN (NITROSTAT) 0.4  MG SL tablet Place 1 tablet (0.4 mg total) under the tongue every 5 (five) minutes as needed for chest pain.  ? ?No facility-administered medications prior to visit.  ? ? ?Review of Systems. ?Review of Systems:  ?A fourteen system review of systems was performed and found to be positive as per HPI. ? ?Last CBC ?Lab Results  ?Component Value Date  ? WBC 4.8 11/27/2021  ? HGB 12.3 11/27/2021  ?  HCT 36.4 11/27/2021  ? MCV 93.1 11/27/2021  ? MCH 31.5 11/27/2021  ? RDW 13.1 11/27/2021  ? PLT 188 11/27/2021  ? ?Last metabolic panel ?Lab Results  ?Component Value Date  ? GLUCOSE 94 11/27/2021  ? NA 134 (L) 11/27/2021  ? K 3.9 11/27/2021  ? CL 104 11/27/2021  ? CO2 24 11/27/2021  ? BUN 15 11/27/2021  ? CREATININE 0.67 11/27/2021  ? GFRNONAA >60 11/27/2021  ? CALCIUM 9.3 11/27/2021  ? PHOS 3.3 11/27/2021  ? PROT 6.5 11/26/2021  ? ALBUMIN 4.1 11/26/2021  ? LABGLOB 1.7 08/29/2021  ? AGRATIO 2.6 (H) 08/29/2021  ? BILITOT 0.6 11/26/2021  ? ALKPHOS 69 11/26/2021  ? AST 22 11/26/2021  ? ALT 18 11/26/2021  ? ANIONGAP 6 11/27/2021  ? ?Last lipids ?Lab Results  ?Component Value Date  ? CHOL 199 11/27/2021  ? HDL 63 11/27/2021  ? LDLCALC 116 (H) 11/27/2021  ? TRIG 98 11/27/2021  ? CHOLHDL 3.2 11/27/2021  ? ?Last hemoglobin A1c ?Lab Results  ?Component Value Date  ? HGBA1C 5.0 11/27/2021  ? ?Last thyroid functions ?Lab Results  ?Component Value Date  ? TSH 2.580 03/29/2021  ? ?Last vitamin D ?Lab Results  ?Component Value Date  ? VD25OH 42.1 03/02/2020  ? ?  Objective  ?  ?BP (!) 94/56   Pulse 89   Temp 97.7 ?F (36.5 ?C)   Ht 5' 3.5" (1.613 m)   Wt 129 lb (58.5 kg)   SpO2 96%   BMI 22.49 kg/m?  ?BP Readings from Last 3 Encounters:  ?12/03/21 (!) 94/56  ?11/27/21 (!) 142/63  ?11/24/21 138/75  ? ?Wt Readings from Last 3 Encounters:  ?12/03/21 129 lb (58.5 kg)  ?11/24/21 130 lb (59 kg)  ?09/26/21 133 lb 1.9 oz (60.4 kg)  ? ? ?Physical Exam  ?General:  Well Developed, well nourished, appropriate for stated age.  ?Neuro:  Alert and oriented,  extra-ocular muscles intact  ?HEENT:  Normocephalic, atraumatic, neck supple  ?Skin:  no gross rash, warm, pink. ?Cardiac:  RRR, S1 S2 ?Respiratory: CTA B/L  ?Vascular:  Ext warm, no cyanosis apprec.; cap RF less 2 sec. ?Psych:  No HI/SI, judgement and insight good, Euthymic mood. Full Affect. ? ? ?No results found for any visits on 12/03/21. ? Assessment & Plan  ?  ? ? ?Problem List  Items Addressed This Visit   ? ?  ? Cardiovascular and Mediastinum  ? TIA (transient ischemic attack) (Chronic)  ? Relevant Orders  ? Ambulatory referral to Cardiology  ?  ? Other  ? Hyperlipidemia (Chronic)  ? ?Other Visit Diagnoses   ? ? Hospital discharge follow-up    -  Primary  ? ?  ? ?Hospital discharge follow-up, TIA: ?-Reviewed hospital notes, labs and imaging. ?-Discussed with patient secondary stroke prevention.  ?-Advised to continue with aspirin and Plavix x 21 days and then continue with aspirin only. ?-Will place cardiology referral for outpatient cardiac monitoring.  ?-BP low today. Discussed adequate hydration.  ? ?Hyperlipidemia: ?-Recent lipid panel: LDL 116 ?-Discussed with patient  to start Crestor 40 mg. Advised if unable to tolerate due to myalgias then take Crestor 20 mg. Discussed LDL goal of <70 and if unable to tolerate rosuvastatin (Crestor) then to consider alternative treatment options. Will repeat lipid panel and hepatic function in 6 weeks.  ? ? ?Return for lab visit in 6 weeks for lipid panel and CMP.  ?   ? ? ? ?Lorrene Reid, PA-C  ?[facility] ?[phone] (phone) ?[phone] (fax) ? ?[facility] ?

## 2021-12-04 NOTE — Progress Notes (Signed)
Cardiology Office Note:    Date:  10/04/2022   ID:  Cheryl Martin, DOB November 18, 1930, MRN [id number]  PCP:  Velva Harman, PA   [facility] Providers Cardiologist:  Lenna Sciara, MD Referring MD: Lorrene Reid, PA-C   Chief Complaint/Reason for Referral: Hospital follow-up TIA  PATIENT DID NOT APPEAR FOR APPOINTMENT   ASSESSMENT:    1. TIA (transient ischemic attack)   2. Coronary artery calcification   3. Aortic atherosclerosis (Cheryl Martin)   4. Hyperlipidemia, unspecified hyperlipidemia type     PLAN:    In order of problems listed above: 1.  TIA: Continue aspirin and we will obtain monitor to evaluate for occult atrial fibrillation.  Of note her left atria are of normal size on recent echocardiogram.  Follow-up in 9 months or earlier if needed. 2.  Coronary artery calcification: Continue aspirin.  Given advanced age I do not think strict lipid control is required. 3.  Aortic atherosclerosis: Continue aspirin.  See discussion above regarding lipid control. 4.  Hyperlipidemia: LDL recently was 116.  It is unclear to me whether strict lipid control in this 88 year old will be of much benefit.              History of Present Illness:    FOCUSED PROBLEM LIST:   1.  Multivessel coronary artery calcification CT scan 2020 2.  Aortic atherosclerosis 2020 3.  Hyperlipidemia 4.  Statin intolerance in response to Crestor  The patient is a 88 y.o. female with the indicated medical history here for hospital follow-up regarding a TIA.  The patient presented earlier this month with word finding difficulties.  She underwent an evaluation including a CT angio which demonstrated no significant stenosis or large vessel occlusion.  An MRI of the brain was also negative.         Current Medications: No outpatient medications have been marked as taking for the 12/06/21 encounter (Office Visit) with [name], MD.     Allergies:    Oxybutynin, Tape, Flexeril  [cyclobenzaprine], Mold extract [trichophyton], and Pollen extract   Social History:   Social History   Tobacco Use   Smoking status: Former    Packs/day: 0.25    Years: 22.00    Additional pack years: 0.00    Total pack years: 5.50    Types: Cigarettes    Quit date: 07/22/1973    Years since quitting: 49.2    Passive exposure: Past   Smokeless tobacco: Never   Tobacco comments:    smoked off and on for 22 years "social smoker"  Vaping Use   Vaping Use: Never used  Substance Use Topics   Alcohol use: No    Alcohol/week: 0.0 standard drinks of alcohol   Drug use: No     Family Hx: Family History  Problem Relation Age of Onset   Colon cancer Brother    Stomach cancer Brother    Pancreatic cancer Neg Hx    Liver cancer Neg Hx      Review of Systems:   Please see the history of present illness.    All other systems reviewed and are negative.     EKGs/Labs/Other Test Reviewed:    EKG:  EKG performed May 2023 that I personally reviewed demonstrates sinus rhythm  Prior CV studies: TTE 2023 demonstrates normal ejection fraction with diastolic dysfunction and no intracardiac shunt and no significant valvular abnormalities.  Other studies Reviewed: Review of the additional studies/records demonstrates: CT chest 2021 with aortic atherosclerosis and  three-vessel coronary artery calcification  Recent Labs: 01/14/2022: ALT 17 08/09/2022: BUN 11; Creatinine, Ser 0.69; Hemoglobin 12.2; Magnesium 2.2; Platelets 192; Potassium 4.0; Sodium 135   Recent Lipid Panel Lab Results  Component Value Date/Time   CHOL 148 01/14/2022 09:42 AM   TRIG 73 01/14/2022 09:42 AM   HDL 71 01/14/2022 09:42 AM   LDLCALC 63 01/14/2022 09:42 AM    Risk Assessment/Calculations:          Physical Exam:      Signed, [name], MD  10/04/2022 9:07 AM    [address], [place], [place]  [address] Phone: [phone]; Fax: [phone]   Note:   This document was prepared using Dragon voice recognition software and may include unintentional dictation errors.

## 2021-12-05 ENCOUNTER — Ambulatory Visit (HOSPITAL_BASED_OUTPATIENT_CLINIC_OR_DEPARTMENT_OTHER): Payer: Medicare HMO | Admitting: Physical Therapy

## 2021-12-06 ENCOUNTER — Other Ambulatory Visit: Payer: Self-pay | Admitting: Physician Assistant

## 2021-12-06 ENCOUNTER — Ambulatory Visit (INDEPENDENT_AMBULATORY_CARE_PROVIDER_SITE_OTHER): Payer: Medicare HMO | Admitting: Internal Medicine

## 2021-12-06 DIAGNOSIS — G459 Transient cerebral ischemic attack, unspecified: Secondary | ICD-10-CM

## 2021-12-06 DIAGNOSIS — I700 Atherosclerosis of aorta: Secondary | ICD-10-CM

## 2021-12-06 DIAGNOSIS — E785 Hyperlipidemia, unspecified: Secondary | ICD-10-CM

## 2021-12-06 DIAGNOSIS — I2510 Atherosclerotic heart disease of native coronary artery without angina pectoris: Secondary | ICD-10-CM

## 2021-12-06 DIAGNOSIS — I2584 Coronary atherosclerosis due to calcified coronary lesion: Secondary | ICD-10-CM

## 2021-12-07 ENCOUNTER — Ambulatory Visit (HOSPITAL_BASED_OUTPATIENT_CLINIC_OR_DEPARTMENT_OTHER): Payer: Medicare HMO | Admitting: Physical Therapy

## 2021-12-07 ENCOUNTER — Telehealth: Payer: Self-pay | Admitting: Internal Medicine

## 2021-12-07 NOTE — Telephone Encounter (Signed)
Called patient and she was wanting to change her office visit appointment to a mychart video visit.   12/11/2021  She states she had a recent TIA and is advised not to drive at the moment.   Dr Melvyn Novas are you ok with this change

## 2021-12-08 NOTE — Telephone Encounter (Signed)
That's fine

## 2021-12-10 ENCOUNTER — Ambulatory Visit (HOSPITAL_BASED_OUTPATIENT_CLINIC_OR_DEPARTMENT_OTHER): Payer: Medicare HMO | Admitting: Physical Therapy

## 2021-12-10 NOTE — Telephone Encounter (Signed)
Office visit is changed to mychart per patients request. Nothing further

## 2021-12-11 ENCOUNTER — Telehealth (INDEPENDENT_AMBULATORY_CARE_PROVIDER_SITE_OTHER): Payer: Medicare HMO | Admitting: Internal Medicine

## 2021-12-11 ENCOUNTER — Encounter: Payer: Self-pay | Admitting: Internal Medicine

## 2021-12-11 DIAGNOSIS — J479 Bronchiectasis, uncomplicated: Secondary | ICD-10-CM

## 2021-12-11 NOTE — Assessment & Plan Note (Addendum)
Onset   ? around 2017  - see CT chest 01/12/2019 - Labs ordered 03/02/2019     alpha one AT phenotype  MM level 147 - Allergy profile 03/02/19 >  Eos 0.1  /  IgE  13 RAST neg  - Ig profile: 03/02/2019   Minimal decrease IgM and IgG - 04/15/2019 added prn cycles of zpak for flares  - PFT's  09/21/2019  FEV1 2.22 (128 % ) ratio 0.75  p 4 % improvement from saba p nothing prior to study       Well compensated on just zpak prn taking on avg once every 3 months with mucus "always clears back up"   No clinical change and since could not come in for in person ov today due to cva will need set up f/u for 6 m with cxr, call sooner if needed

## 2021-12-11 NOTE — Progress Notes (Signed)
Cheryl Martin, female    DOB: [birth year],    MRN: [id number]   Brief patient profile:  59 yowf MM/quit smoking 1975 moved from [place] 1984 with recurrent episodes of  bronchitis variable times every since arrival in [place] with some seasonal rhinitis esp in Spring and abnormal cxr since around 2017 with new dx of bronchiectasis by CT chest  01/12/2019 so referred to pulmonary clinic 03/02/2019 by Dr   Raliegh Scarlet     History of Present Illness  03/02/2019  Pulmonary/ 1st office eval/Djon Tith  Chief Complaint  Patient presents with   Pulmonary Consult    Referred by Dr Raliegh Scarlet for bronchiectasis.    Dyspnea:  Can walk 25 min flat around a track each lap takes a min avg pace -faster most people her age Cough: just with flares / really not significant production chronically, just during flares of what she's been calling bronchitis which is only once a year at most.  Sleep: nasal congestion but doesn't keep her up, one pillow  SABA use: none New abd pain x 4 months no relation with meals  rec GERD diet  Bronchiectasis =    Whenever you develop cough congestion take mucinex or mucinex dm up to 1200 mg every 12 hours as needed        04/15/2019  f/u ov/Sima Lindenberger re: bronchiectasis  Chief Complaint  Patient presents with   Follow-up    Breathing is doing well. She has occ throat clearing.   Dyspnea:  Walking track > no change in ex tol  Cough: none x with flares , last jan/feb 2020  Sleeping: some nasal congestion / doesn't really keep her up  SABA use: none  02: none  rec In the event of a flare of cough with excess or nasty mucus > refillable zpak  pfts on return > wnl    12/11/2020  f/u ov/Veasna Santibanez re: bronchiectasis on prn zpak Chief Complaint  Patient presents with   Follow-up    Breathing is overall doing well. She has had slight increase in cough past few days, non prod and she relates to pollen allergy.   Dyspnea:  Not limited by breathing from desired activities   Cough: last zpak x 56mno  color to mucus  Sleeping: min nasal congestion noct, o/w no resp sympoms SABA use: none 02: none  Rec No change rx     12/11/2021  Virtual  ov/Hiba Garry re: bronchiectasis maint on zpak prn   No chief complaint on file.    I connected with CDarin Engelson 12/11/21 at  1:30 PM EDT by Mychart video  and verified that I am speaking with the correct person using two identifiers. Pt is at home and this call made from my office with no other participants    I discussed the limitations, risks, security and privacy concerns of performing an evaluation and management service by telephone and the availability of in person appointments. I also discussed with the patient that there may be a patient responsible charge related to this service. The patient expressed understanding and agreed to proceed.  Dyspnea:  no change  but very sedentary Cough: just watery pnd Sleeping: able to lie flat/ one pillow  SABA use: none  02: none      No obvious day to day or daytime variability or assoc excess/ purulent sputum or mucus plugs or hemoptysis or cp or chest tightness, subjective wheeze or overt sinus or hb symptoms.   Sleeping  without nocturnal  or early am exacerbation  of respiratory  c/o's or need for noct saba. Also denies any obvious fluctuation of symptoms with weather or environmental changes or other aggravating or alleviating factors except as outlined above   No unusual exposure hx or h/o childhood pna/ asthma or knowledge of premature birth.  Current Allergies, Complete Past Medical History, Past Surgical History, Family History, and Social History were reviewed in [employer] record.  ROS  The following are not active complaints unless bolded Hoarseness, sore throat, dysphagia, dental problems, itching, sneezing,  nasal congestion or discharge of excess mucus or purulent secretions, ear ache,   fever, chills, sweats, unintended wt loss or wt gain, classically  pleuritic or exertional cp,  orthopnea pnd or arm/hand swelling  or leg swelling, presyncope, palpitations, abdominal pain, anorexia, nausea, vomiting, diarrhea  or change in bowel habits or change in bladder habits, change in stools or change in urine, dysuria, hematuria,  rash, arthralgias, visual complaints, headache, numbness, weakness or ataxia or problems with walking = a little off balance since TIA/ minimal expressive aphasia or coordination,  change in mood or  memory.         Current Meds  Medication Sig   acetaminophen (TYLENOL) 500 MG tablet Take 1,000 mg by mouth every 6 (six) hours as needed for moderate pain or headache.   ASPIRIN LOW DOSE 81 MG EC tablet TAKE 1 TABLET BY MOUTH EVERY DAY (Patient taking differently: Take 81 mg by mouth daily.)   Bioflavonoid Products (VITAMIN C) CHEW Chew 1 tablet by mouth daily.   BIOTIN PO Take 1 tablet by mouth daily.   Cholecalciferol (VITAMIN D-3 PO) Take 1,000 mcg by mouth daily with breakfast.   clopidogrel (PLAVIX) 75 MG tablet Take 1 tablet (75 mg total) by mouth daily for 21 days.   Cyanocobalamin (B-12) 1000 MCG SUBL Place 2,000 mcg under the tongue daily.   Melatonin 3 MG CAPS Take 3 mg by mouth at bedtime as needed (sleep).   Multiple Vitamin (MULTIVITAMIN WITH MINERALS) TABS tablet Take 1 tablet by mouth daily.   polyethylene glycol (MIRALAX / GLYCOLAX) 17 g packet Take 17 g by mouth every other day.   Sodium Chloride, Hypertonic, (MURO 128 OP) Place 1 drop into both eyes See admin instructions. Instill 1 drop into each eye in the morning, then as needed daily for dryness/irritation           Past Medical History:  Diagnosis Date   ADD (attention deficit disorder)    Allergy    Anemia    as a teenager   Arthritis    Complication of anesthesia    slow to wake up after colonoscopy   Edema    Hearing loss    History of hiatal hernia    hx of   Hypothyroidism    hx of as child and during pregnancy   Neuromuscular disorder  ([place])    Nocturia    Pneumonia    hx of after bronchitis   PONV (postoperative nausea and vomiting)    Thyroid disease   Hands feet swelling      Objective:       12/11/2020       136  06/13/2020     136  09/21/2019         140  04/15/19 138 lb (62.6 kg)  04/08/19 137 lb (62.1 kg)  03/15/19 136 lb (61.7 kg)     General appearance:    Elderly wf/ good  voice texture, no spont cough or conversational sob   Toal time 28 min       Assessment

## 2021-12-11 NOTE — Patient Instructions (Signed)
Since she was unable to come in today needs ov in 6 months with cxr.     No change in medications.
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
## 2021-12-13 ENCOUNTER — Ambulatory Visit (INDEPENDENT_AMBULATORY_CARE_PROVIDER_SITE_OTHER): Payer: Medicare HMO

## 2021-12-13 ENCOUNTER — Encounter: Payer: Self-pay | Admitting: Physician Assistant

## 2021-12-13 ENCOUNTER — Other Ambulatory Visit: Payer: Self-pay | Admitting: Physician Assistant

## 2021-12-13 ENCOUNTER — Ambulatory Visit: Payer: Medicare HMO | Admitting: Physician Assistant

## 2021-12-13 VITALS — BP 112/56 | HR 67 | Ht 63.5 in | Wt 129.8 lb

## 2021-12-13 DIAGNOSIS — G459 Transient cerebral ischemic attack, unspecified: Secondary | ICD-10-CM

## 2021-12-13 DIAGNOSIS — I4891 Unspecified atrial fibrillation: Secondary | ICD-10-CM

## 2021-12-13 DIAGNOSIS — I2584 Coronary atherosclerosis due to calcified coronary lesion: Secondary | ICD-10-CM

## 2021-12-13 DIAGNOSIS — I2510 Atherosclerotic heart disease of native coronary artery without angina pectoris: Secondary | ICD-10-CM | POA: Diagnosis not present

## 2021-12-13 DIAGNOSIS — R42 Dizziness and giddiness: Secondary | ICD-10-CM

## 2021-12-13 DIAGNOSIS — E785 Hyperlipidemia, unspecified: Secondary | ICD-10-CM | POA: Diagnosis not present

## 2021-12-13 NOTE — Progress Notes (Unsigned)
[employer] event monitor [id number] from office inventory applied to patient.

## 2021-12-13 NOTE — Patient Instructions (Signed)
Medication Instructions:  Your physician recommends that you continue on your current medications as directed. Please refer to the Current Medication list given to you today. *If you need a refill on your cardiac medications before your next appointment, please call your pharmacy*   Lab Work: None ordered If you have labs (blood work) drawn today and your tests are completely normal, you will receive your results only by: [place] (if you have MyChart) OR A paper copy in the mail If you have any lab test that is abnormal or we need to change your treatment, we will call you to review the results.   Testing/Procedures: Your physician has recommended that you wear an 30 day event monitor. Event monitors are medical devices that record the heart's electrical activity. Doctors most often [place] these monitors to diagnose arrhythmias. Arrhythmias are problems with the speed or rhythm of the heartbeat. The monitor is a small, portable device. You can wear one while you do your normal daily activities. This is usually used to diagnose what is causing palpitations/syncope (passing out).   Follow-Up: At [facility], you and your health needs are our priority.  As part of our continuing mission to provide you with exceptional heart care, we have created designated Provider Care Teams.  These Care Teams include your primary Cardiologist (physician) and Advanced Practice Providers (APPs -  Physician Assistants and Nurse Practitioners) who all work together to provide you with the care you need, when you need it.  We recommend signing up for the patient portal called "MyChart".  Sign up information is provided on this After Visit Summary.  MyChart is used to connect with patients for Virtual Visits (Telemedicine).  Patients are able to view lab/test results, encounter notes, upcoming appointments, etc.  Non-urgent messages can be sent to your provider as well.   To learn more about what you can do with  MyChart, go to [email or web address].    Your next appointment:   6 month(s)  The format for your next appointment:   In Person  Provider:   Allegra Lai, MD   Other Instructions   Important Information About Sugar

## 2021-12-14 ENCOUNTER — Ambulatory Visit (HOSPITAL_BASED_OUTPATIENT_CLINIC_OR_DEPARTMENT_OTHER): Payer: Medicare HMO | Admitting: Physical Therapy

## 2021-12-19 ENCOUNTER — Ambulatory Visit (HOSPITAL_BASED_OUTPATIENT_CLINIC_OR_DEPARTMENT_OTHER): Payer: Medicare HMO | Admitting: Physical Therapy

## 2021-12-21 ENCOUNTER — Ambulatory Visit (HOSPITAL_BASED_OUTPATIENT_CLINIC_OR_DEPARTMENT_OTHER): Payer: Medicare HMO | Admitting: Physical Therapy

## 2022-01-02 ENCOUNTER — Ambulatory Visit: Payer: Medicare HMO | Admitting: Neurology

## 2022-01-02 ENCOUNTER — Encounter: Payer: Self-pay | Admitting: Neurology

## 2022-01-02 VITALS — BP 129/69 | HR 53 | Ht 63.0 in | Wt 130.0 lb

## 2022-01-02 DIAGNOSIS — G44209 Tension-type headache, unspecified, not intractable: Secondary | ICD-10-CM | POA: Diagnosis not present

## 2022-01-02 DIAGNOSIS — G459 Transient cerebral ischemic attack, unspecified: Secondary | ICD-10-CM

## 2022-01-02 DIAGNOSIS — F09 Unspecified mental disorder due to known physiological condition: Secondary | ICD-10-CM | POA: Diagnosis not present

## 2022-01-02 NOTE — Patient Instructions (Addendum)
Continue current medications Consider magnesium oxide as headache preventive medication Follow-up in 6 months, at that time we will repeat [place] versus MMSE Follow-up with cardiology as scheduled
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
## 2022-01-12 DIAGNOSIS — M5416 Radiculopathy, lumbar region: Secondary | ICD-10-CM | POA: Diagnosis not present

## 2022-01-14 ENCOUNTER — Other Ambulatory Visit: Payer: Self-pay

## 2022-01-14 ENCOUNTER — Other Ambulatory Visit: Payer: Medicare HMO

## 2022-01-14 DIAGNOSIS — Z09 Encounter for follow-up examination after completed treatment for conditions other than malignant neoplasm: Secondary | ICD-10-CM

## 2022-01-14 DIAGNOSIS — N3281 Overactive bladder: Secondary | ICD-10-CM

## 2022-01-14 DIAGNOSIS — Z0000 Encounter for general adult medical examination without abnormal findings: Secondary | ICD-10-CM

## 2022-01-15 ENCOUNTER — Other Ambulatory Visit: Payer: Medicare HMO

## 2022-01-15 DIAGNOSIS — N3281 Overactive bladder: Secondary | ICD-10-CM

## 2022-01-15 LAB — URINALYSIS, COMPLETE
Bilirubin, UA: NEGATIVE
Glucose, UA: NEGATIVE
Ketones, UA: NEGATIVE
Leukocytes,UA: NEGATIVE
Nitrite, UA: NEGATIVE
Protein,UA: NEGATIVE
Specific Gravity, UA: 1.01 (ref 1.005–1.030)
Urobilinogen, Ur: 0.2 mg/dL (ref 0.2–1.0)
pH, UA: 6 (ref 5.0–7.5)

## 2022-01-15 LAB — COMPREHENSIVE METABOLIC PANEL
ALT: 17 IU/L (ref 0–32)
AST: 24 IU/L (ref 0–40)
Albumin/Globulin Ratio: 2.6 — ABNORMAL HIGH (ref 1.2–2.2)
Albumin: 4.4 g/dL (ref 3.6–4.6)
Alkaline Phosphatase: 83 IU/L (ref 44–121)
BUN/Creatinine Ratio: 17 (ref 12–28)
BUN: 11 mg/dL (ref 8–27)
Bilirubin Total: 0.4 mg/dL (ref 0.0–1.2)
CO2: 22 mmol/L (ref 20–29)
Calcium: 9.4 mg/dL (ref 8.7–10.3)
Chloride: 102 mmol/L (ref 96–106)
Creatinine, Ser: 0.66 mg/dL (ref 0.57–1.00)
Globulin, Total: 1.7 g/dL (ref 1.5–4.5)
Glucose: 88 mg/dL (ref 70–99)
Potassium: 4.5 mmol/L (ref 3.5–5.2)
Sodium: 141 mmol/L (ref 134–144)
Total Protein: 6.1 g/dL (ref 6.0–8.5)
eGFR: 84 mL/min/{1.73_m2} (ref >59)

## 2022-01-15 LAB — LIPID PANEL
Chol/HDL Ratio: 2.1 ratio (ref 0.0–4.4)
Cholesterol, Total: 148 mg/dL (ref 100–199)
HDL: 71 mg/dL (ref >39)
LDL Chol Calc (NIH): 63 mg/dL (ref 0–99)
Triglycerides: 73 mg/dL (ref 0–149)
VLDL Cholesterol Cal: 14 mg/dL (ref 5–40)

## 2022-01-15 LAB — MICROSCOPIC EXAMINATION: Bacteria, UA: NONE SEEN

## 2022-01-16 ENCOUNTER — Telehealth: Payer: Self-pay | Admitting: Physician Assistant

## 2022-01-16 ENCOUNTER — Emergency Department (HOSPITAL_BASED_OUTPATIENT_CLINIC_OR_DEPARTMENT_OTHER)
Admission: EM | Admit: 2022-01-16 | Discharge: 2022-01-16 | Disposition: A | Discharge status: Home / Self Care | Payer: Medicare HMO | Attending: Emergency Medicine | Admitting: Emergency Medicine

## 2022-01-16 ENCOUNTER — Other Ambulatory Visit (HOSPITAL_BASED_OUTPATIENT_CLINIC_OR_DEPARTMENT_OTHER): Payer: Self-pay

## 2022-01-16 ENCOUNTER — Encounter (HOSPITAL_BASED_OUTPATIENT_CLINIC_OR_DEPARTMENT_OTHER): Payer: Self-pay | Admitting: Emergency Medicine

## 2022-01-16 ENCOUNTER — Other Ambulatory Visit: Payer: Self-pay

## 2022-01-16 DIAGNOSIS — R209 Unspecified disturbances of skin sensation: Secondary | ICD-10-CM | POA: Diagnosis not present

## 2022-01-16 DIAGNOSIS — R2243 Localized swelling, mass and lump, lower limb, bilateral: Secondary | ICD-10-CM | POA: Insufficient documentation

## 2022-01-16 DIAGNOSIS — Z7982 Long term (current) use of aspirin: Secondary | ICD-10-CM | POA: Insufficient documentation

## 2022-01-16 DIAGNOSIS — R21 Rash and other nonspecific skin eruption: Secondary | ICD-10-CM | POA: Insufficient documentation

## 2022-01-16 DIAGNOSIS — L299 Pruritus, unspecified: Secondary | ICD-10-CM | POA: Diagnosis not present

## 2022-01-16 MED ORDER — PREDNISONE 20 MG PO TABS
40.0000 mg | ORAL_TABLET | Freq: Every day | ORAL | 0 refills | Status: DC
  Filled 2022-01-16: qty 10, 5d supply, fill #0

## 2022-01-16 NOTE — ED Provider Notes (Signed)
[facility] EMERGENC[facility] DEPT Provider Note   CSN: [id number] Arrival date & time: 01/16/22  1210     History  Chief Complaint  Patient presents with   Rash   Leg Swelling    Cheryl Martin is a 88 y.o. female.  Presents emerged department due to concern for chief complaint of rash.  Secondary complaint she also had some tingling in her leg.  Rash started about 2 weeks ago.  Initially it was quite itchy.  Tried putting on a cream and this seemed to help with the itchiness but rash has not dissipated.  Has not grown significantly on the leg however a few days ago seem to pop up on her right leg.  No fevers or chills.  No new swelling to the legs over the past couple weeks.  She said that ever since she had surgery on her left knee a few years ago she had some numb tingliness around her knee.  Over the past few weeks this seems to have progressed down her leg.  Has not noted any weakness in her leg.  Denies any numbness or weakness elsewhere in her body, no speech or vision changes. HPI     Home Medications Prior to Admission medications   Medication Sig Start Date End Date Taking? Authorizing Provider  predniSONE (DELTASONE) 20 MG tablet Take 2 tablets (40 mg total) by mouth daily for 5 days. 01/16/22 01/21/22 Yes Estil Vallee, Ellwood Dense, MD  acetaminophen (TYLENOL) 500 MG tablet Take 1,000 mg by mouth every 6 (six) hours as needed for moderate pain or headache.    [provider]  ASPIRIN LOW DOSE 81 MG EC tablet TAKE 1 TABLET BY MOUTH EVERY DAY 01/29/21   Abonza, Maritza, PA-C  Bioflavonoid Products (VITAMIN C) CHEW Chew 1 tablet by mouth daily.    [provider]  BIOTIN PO Take 1 tablet by mouth daily.    [provider]  Cholecalciferol (VITAMIN D-3 PO) Take 1,000 mcg by mouth daily with breakfast.    [provider]  Cyanocobalamin (B-12) 1000 MCG SUBL Place 2,000 mcg under the tongue daily.    [provider]  Melatonin 3 MG CAPS  Take 3 mg by mouth at bedtime as needed (sleep).    [provider]  Multiple Vitamin (MULTIVITAMIN WITH MINERALS) TABS tablet Take 1 tablet by mouth daily.    [provider]  nitroGLYCERIN (NITROSTAT) 0.4 MG SL tablet Place 1 tablet (0.4 mg total) under the tongue every 5 (five) minutes as needed for chest pain. 10/22/18 11/27/21  Dorothy Spark, MD  polyethylene glycol (MIRALAX / GLYCOLAX) 17 g packet Take 17 g by mouth every other day.    [provider]  rosuvastatin (CRESTOR) 20 MG tablet Take 20 mg by mouth daily.    [provider]  Sodium Chloride, Hypertonic, (MURO 128 OP) Place 1 drop into both eyes See admin instructions. Instill 1 drop into each eye in the morning, then as needed daily for dryness/irritation    [provider]      Allergies    Oxybutynin, Tape, Flexeril [cyclobenzaprine], Mold extract [trichophyton], and Pollen extract    Review of Systems   Review of Systems  Constitutional:  Negative for chills and fever.  HENT:  Negative for ear pain and sore throat.   Eyes:  Negative for pain and visual disturbance.  Respiratory:  Negative for cough and shortness of breath.   Cardiovascular:  Negative for chest pain and palpitations.  Gastrointestinal:  Negative for abdominal pain and vomiting.  Genitourinary:  Negative for dysuria and hematuria.  Musculoskeletal:  Negative for arthralgias and back pain.  Skin:  Positive for rash. Negative for color change.  Neurological:  Positive for numbness. Negative for seizures and syncope.  All other systems reviewed and are negative.   Physical Exam Updated Vital Signs BP (!) 141/70 (BP Location: Right Arm)   Pulse 63   Temp 97.8 F (36.6 C)   Resp 16   Ht '5\' 3"'$  (1.6 m)   Wt 58.1 kg   SpO2 99%   BMI 22.67 kg/m  Physical Exam Vitals and nursing note reviewed.  Constitutional:      General: She is not in acute distress.    Appearance: She is well-developed.  HENT:      Head: Normocephalic and atraumatic.  Eyes:     Conjunctiva/sclera: Conjunctivae normal.  Cardiovascular:     Rate and Rhythm: Normal rate and regular rhythm.     Heart sounds: No murmur heard. Pulmonary:     Effort: Pulmonary effort is normal. No respiratory distress.     Breath sounds: Normal breath sounds.  Abdominal:     Palpations: Abdomen is soft.     Tenderness: There is no abdominal tenderness.  Musculoskeletal:     Cervical back: Neck supple.     Comments: No tenderness palpation throughout entirety of her lower legs, she has palpable DP and PT pulses in both feet, she has good Doppler signal in her left foot  Skin:    General: Skin is warm and dry.     Capillary Refill: Capillary refill takes less than 2 seconds.     Comments: Around 5 cm diameter area of erythema to the proximal lower leg on the medial side, blanchable, slightly raised; there is a similar-appearing but smaller 1 cm diameter area of erythema to the medial aspect of her right knee, also blanchable and slightly raised  Neurological:     Mental Status: She is alert.     Comments: 5 out of 5 strength throughout both lower extremities, sensation to light touch intact throughout both lower extremities  Psychiatric:        Mood and Affect: Mood normal.     ED Results / Procedures / Treatments   Labs (all labs ordered are listed, but only abnormal results are displayed) Labs Reviewed - No data to display  EKG None  Radiology No results found.  Procedures Procedures    Medications Ordered in ED Medications - No data to display  ED Course/ Medical Decision Making/ A&P                           Medical Decision Making Risk Prescription drug management.   88 year old lady presented to ER due to concern for rash.  Patient has a rash to the medial aspect of her left leg.  Raised, blanchable, given the association with itching, lack of fever or other systemic symptoms I suspect most likely some sort of  allergic reaction.  No specific known triggers.  Has a couple small areas on her right leg as well.  I recommended course of steroids.  Advise she follow-up with her primary care doctor in 48 hours for recheck.  Regarding the numb sensation in her left leg.  She described an area extending from her knee down part of her lower leg.  Her strength is intact and sensation to light touch intact throughout  careful inspection.  She has no other associated neuro complaints to suggest central etiology.  Given her intact neuro exam I do not feel she needs any further ER testing today.  Patient does have prior history of TIA and has seen a neurologist previously.  Advised that she follow-up with a neurologist to discuss the symptoms further.  Daughter at bedside in agreement and demonstrated understanding of need for PCP follow-up, neuro follow-up and return precautions for ER.    After the discussed management above, the patient was determined to be safe for discharge.  The patient was in agreement with this plan and all questions regarding their care were answered.  ED return precautions were discussed and the patient will return to the ED with any significant worsening of condition.        Final Clinical Impression(s) / ED Diagnoses Final diagnoses:  Rash    Rx / DC Orders ED Discharge Orders          Ordered    predniSONE (DELTASONE) 20 MG tablet  Daily        01/16/22 1627              Lucrezia Starch, MD 01/16/22 1724

## 2022-01-16 NOTE — ED Triage Notes (Signed)
Pt arrives to ED with c/o rash and left leg swelling. Pt reports she developed a red, raised rash to her left leg x1.5 weeks ago. Pt reports new red, raised rash to right leg that started just x3 days ago. Pt also reports swelling her left leg started today. Pt reports that the rash itches, denies pain.

## 2022-01-16 NOTE — Discharge Instructions (Signed)
For the rash, I recommend taking a trial of oral steroids.  Please follow-up with your primary care doctor and have this area rechecked sometime in the next couple days, ideally on Friday if your primary doctor has availability.  If however you notice that the area of redness is worsening or you develop a fever or swelling or increasing pain in the area, please return to the emergency room for reassessment at that time.  For the tingling/numb sensation in your left leg I would recommend following up with the neurologist that you had seen previously.  Call their office to request an appointment.  If you develop any increase in area of numbness, any weakness speech change, vision change, or other new neurologic symptoms, please return immediately to ER for reassessment.  For itching I would recommend taking over-the-counter Benadryl as needed.

## 2022-01-18 ENCOUNTER — Telehealth: Payer: Self-pay | Admitting: *Deleted

## 2022-01-18 ENCOUNTER — Ambulatory Visit: Payer: Medicare HMO | Admitting: Gastroenterology

## 2022-01-18 ENCOUNTER — Encounter: Payer: Self-pay | Admitting: Gastroenterology

## 2022-01-18 VITALS — BP 122/70 | HR 68 | Ht 62.75 in | Wt 130.1 lb

## 2022-01-18 DIAGNOSIS — R1012 Left upper quadrant pain: Secondary | ICD-10-CM

## 2022-01-18 DIAGNOSIS — K5909 Other constipation: Secondary | ICD-10-CM

## 2022-01-18 LAB — CULTURE, URINE COMPREHENSIVE

## 2022-01-18 NOTE — Telephone Encounter (Signed)
REQUEST FOR CARDIAC CLEARANCE      Request Clearance  Faxed to: Lorrene Reid, PA-C  Procedure: Botox Bladder Injection  Date of Procedure: 01/29/22  Provider: Dr. Erlene Quan    Cardiac  Clearance : Need clearance   Reason: Ok to hold Aspirin '81mg'$  5 days prior to procedure?  Please advise     Risk Assessment:    Low   '[]'$       Moderate   '[]'$     High   '[]'$           This patient is optimized for surgery  YES '[]'$       NO   '[]'$    I recommend further assessment/workup prior to procedure:    YES '[]'$      NO  '[]'$   Appointment scheduled for: _______________________   Further recommendations: ______________________________    Physician Signature:__________________________________   Printed Name: ________________________________________   Date: _________________

## 2022-01-18 NOTE — Patient Instructions (Addendum)
If you are age 86 or older, your body mass index should be between 23-30. Your Body mass index is 23.23 kg/m. If this is out of the aforementioned range listed, please consider follow up with your Primary Care Provider.  If you are age 52 or younger, your body mass index should be between 19-25. Your Body mass index is 23.23 kg/m. If this is out of the aformentioned range listed, please consider follow up with your Primary Care Provider.   ________________________________________________________   Please follow up as needed.  Thank you for entrusting me with your care and for choosing [facility], Dr. Boon Cellar
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
## 2022-01-23 ENCOUNTER — Telehealth: Payer: Self-pay | Admitting: *Deleted

## 2022-01-23 NOTE — Telephone Encounter (Signed)
   Pre-operative Risk Assessment    Patient Name: Cheryl Martin  DOB: 22-Mar-1931 MRN: [id number]      Request for Surgical Clearance    Procedure:   BOTOX   BLADDER INJECTION (BLADDER INSTALLATION)  Date of Surgery:  Clearance 01/29/22                                 Surgeon:  DR. Erlene Quan Surgeon's Group or Practice Name:   Phone number:  [phone] Fax number:  [phone]   Type of Clearance Requested:   - Medical ; REQUEST TO HOLD ASA x 5 DAYS PRIOR   Type of Anesthesia:   LIDOCAINE   Additional requests/questions:    Jiles Prows   01/23/2022, 2:51 PM

## 2022-01-23 NOTE — Telephone Encounter (Signed)
Pt agreeable to plan of care for tele visit pre op 01/24/22 @ 3:40.  Med rec and consent are done.     Patient Consent for Virtual Visit        Cheryl Martin has provided verbal consent on 01/23/2022 for a virtual visit (video or telephone).   CONSENT FOR VIRTUAL VISIT FOR:  Cheryl Martin  By participating in this virtual visit I agree to the following:  I hereby voluntarily request, consent and authorize [name], physician assistants, nurse practitioners or other licensed health care professionals (the Practitioner), to provide me with telemedicine health care services (the "Services") as deemed necessary by the treating Practitioner. I acknowledge and consent to receive the Services by the Practitioner via telemedicine. I understand that the telemedicine visit will involve communicating with the Practitioner through live audiovisual communication technology and the disclosure of certain medical information by electronic transmission. I acknowledge that I have been given the opportunity to request an in-person assessment or other available alternative prior to the telemedicine visit and am voluntarily participating in the telemedicine visit.  I understand that I have the right to withhold or withdraw my consent to the use of telemedicine in the course of my care at any time, without affecting my right to future care or treatment, and that the Practitioner or I may terminate the telemedicine visit at any time. I understand that I have the right to inspect all information obtained and/or recorded in the course of the telemedicine visit and may receive copies of available information for a reasonable fee.  I understand that some of the potential risks of receiving the Services via telemedicine include:  Delay or interruption in medical evaluation due to technological equipment failure or disruption; Information transmitted may not be sufficient  (e.g. poor resolution of images) to allow for appropriate medical decision making by the Practitioner; and/or  In rare instances, security protocols could fail, causing a breach of personal health information.  Furthermore, I acknowledge that it is my responsibility to provide information about my medical history, conditions and care that is complete and accurate to the best of my ability. I acknowledge that Practitioner's advice, recommendations, and/or decision may be based on factors not within their control, such as incomplete or inaccurate data provided by me or distortions of diagnostic images or specimens that may result from electronic transmissions. I understand that the practice of medicine is not an exact science and that Practitioner makes no warranties or guarantees regarding treatment outcomes. I acknowledge that a copy of this consent can be made available to me via my patient portal (Cheryl Martin), or I can request a printed copy by calling the office of [facility].    I understand that my insurance will be billed for this visit.   I have read or had this consent read to me. I understand the contents of this consent, which adequately explains the benefits and risks of the Services being provided via telemedicine.  I have been provided ample opportunity to ask questions regarding this consent and the Services and have had my questions answered to my satisfaction. I give my informed consent for the services to be provided through the use of telemedicine in my medical care

## 2022-01-23 NOTE — Telephone Encounter (Signed)
Pt agreeable to plan of care for tele visit pre op 01/24/22 @ 3:40.  Med rec and consent are done.

## 2022-01-23 NOTE — Telephone Encounter (Signed)
I will update the requesting office to please see notes in regard to holding ASA. I will call the pt and set up televisit as well.

## 2022-01-23 NOTE — Telephone Encounter (Signed)
   Name: Cheryl Martin  DOB: 1931/05/13  MRN: [id number]  Primary Cardiologist: None   Preoperative team, please contact this patient and set up a phone call appointment for further preoperative risk assessment. Please obtain consent and complete medication review. Thank you for your help.  I confirm that guidance regarding antiplatelet and oral anticoagulation therapy has been completed and, if necessary, noted below.  Patient takes aspirin 81 mg daily for history of coronary artery calcification well as history of TIA.  From a cardiac standpoint, it would be acceptable for her to hold aspirin for 5 days prior to her procedure, however, cardiology cannot provide recommendations for holding aspirin from a neurological perspective. Recommend additional input on holding aspirin from neurology given history of recent TIA.  Lenna Sciara, NP 01/23/2022, 3:30 PM [place] [address] [place],  [address]

## 2022-01-24 ENCOUNTER — Ambulatory Visit (INDEPENDENT_AMBULATORY_CARE_PROVIDER_SITE_OTHER): Payer: Medicare HMO | Admitting: Nurse Practitioner

## 2022-01-24 DIAGNOSIS — Z01810 Encounter for preprocedural cardiovascular examination: Secondary | ICD-10-CM

## 2022-01-24 DIAGNOSIS — M5416 Radiculopathy, lumbar region: Secondary | ICD-10-CM | POA: Diagnosis not present

## 2022-01-24 NOTE — Progress Notes (Signed)
NOTES HAVE BEEN FAXED AS FYI TO REQUESTING OFFICE; PT DID NOT ANSWER THE PHONE TO PROCEED WITH THE TELE VISIT THAT WE HAD SETUP FOR TODAY. PT HAS NOT BEEN CLEARED.

## 2022-01-24 NOTE — Progress Notes (Signed)
   Virtual Visit via Telephone Note   Because of Cheryl Martin's co-morbid illnesses, she is at least at moderate risk for complications without adequate follow up.  This format is felt to be most appropriate for this patient at this time.  The patient did not have access to video technology/had technical difficulties with video requiring transitioning to audio format only (telephone).  All issues noted in this document were discussed and addressed.  No physical exam could be performed with this format.  Please refer to the patient's chart for her consent to telehealth for [facility]  Evaluation Performed:  Preoperative cardiovascular risk assessment _____________   Date:  01/24/2022   Patient ID:  Cheryl Martin, DOB [birth year], MRN [id number] Patient Location:  Home Provider location:   Office  Primary Care Provider:  Lorrene Reid, PA-C Primary Cardiologist:  None  Multiple attempts made to contact patient for preop telephone visit.  Phone went straight to voicemail.  Will notify preoperative team to reschedule telephone visit for preoperative cardiac evaluation.   Lenna Sciara, NP  01/24/2022, 3:44 PM

## 2022-01-25 ENCOUNTER — Ambulatory Visit (INDEPENDENT_AMBULATORY_CARE_PROVIDER_SITE_OTHER): Payer: Medicare HMO | Admitting: Nurse Practitioner

## 2022-01-25 ENCOUNTER — Telehealth: Payer: Self-pay | Admitting: Nurse Practitioner

## 2022-01-25 ENCOUNTER — Telehealth: Payer: Self-pay

## 2022-01-25 DIAGNOSIS — Z01810 Encounter for preprocedural cardiovascular examination: Secondary | ICD-10-CM | POA: Diagnosis not present

## 2022-01-25 NOTE — Telephone Encounter (Signed)
Pt calls triage line, she states that she has been seen by cardiology and received clearance. Advised pt that when clearance is received via fax she will be called to schedule.
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
## 2022-01-25 NOTE — Telephone Encounter (Signed)
I s/w the pt and she has been added onto pre op schedule today as her procedure is 01/29/22. Pt missed her tele call the other day. Ok per pre op provider to add the pt as her procedure is 01/29/22.

## 2022-01-25 NOTE — Telephone Encounter (Signed)
Spoke with patient, I advised pt that cardiology did not clear her and she needs to reach out to cardiology as she missed her visit with them. I canceled pt for BOTOX. Pt aware.

## 2022-01-25 NOTE — Telephone Encounter (Signed)
  Pt calling back, pt said, she missed her tele visit yesterday because she accidentally turned off her phone. She is following up her clearance and requesting if she can get another televisit for her to get clearance for her upcoming procedure

## 2022-01-25 NOTE — Telephone Encounter (Signed)
Pt would like a call back regarding app from yesterday. Please advise

## 2022-01-25 NOTE — Telephone Encounter (Signed)
As per Dr. Erlene Quan OK for pt to continue Aspirin '81mg'$  for BOTOX. Pt aware and verbalized understanding.

## 2022-01-25 NOTE — Telephone Encounter (Signed)
Patient gets botox bladder injections. She had TIA this past May and wants to know if the botox bladder injections are cardiac-safe. Please send approval to urologist: Dr. [name]  [facility]  [address]  [place], [place] [address]  [phone]  Patient is due for her next injection on 7/11 and would like to have the okay for it.

## 2022-01-29 ENCOUNTER — Encounter: Payer: Self-pay | Admitting: Urology

## 2022-01-29 ENCOUNTER — Ambulatory Visit: Payer: Medicare HMO | Admitting: Urology

## 2022-01-29 VITALS — BP 138/69 | HR 65 | Ht 62.75 in | Wt 130.0 lb

## 2022-01-29 DIAGNOSIS — N3281 Overactive bladder: Secondary | ICD-10-CM | POA: Diagnosis not present

## 2022-01-29 DIAGNOSIS — N3941 Urge incontinence: Secondary | ICD-10-CM

## 2022-01-29 LAB — URINALYSIS, COMPLETE
Bilirubin, UA: NEGATIVE
Glucose, UA: NEGATIVE
Ketones, UA: NEGATIVE
Leukocytes,UA: NEGATIVE
Nitrite, UA: NEGATIVE
Protein,UA: NEGATIVE
Specific Gravity, UA: 1.015 (ref 1.005–1.030)
Urobilinogen, Ur: 0.2 mg/dL (ref 0.2–1.0)
pH, UA: 6.5 (ref 5.0–7.5)

## 2022-01-29 LAB — MICROSCOPIC EXAMINATION
Bacteria, UA: NONE SEEN
WBC, UA: NONE SEEN /hpf (ref 0–5)

## 2022-01-29 MED ORDER — SULFAMETHOXAZOLE-TRIMETHOPRIM 800-160 MG PO TABS: 1.0000 | ORAL_TABLET | Freq: Two times a day (BID) | ORAL | Status: DC

## 2022-01-29 MED ORDER — LIDOCAINE HCL 2 % IJ SOLN
50.0000 mL | Freq: Once | INTRAMUSCULAR | Status: AC
  Administered 2022-01-29: 1000 mg

## 2022-01-29 MED ORDER — ONABOTULINUMTOXINA 100 UNITS IJ SOLR
100.0000 [IU] | Freq: Once | INTRAMUSCULAR | Status: AC
  Administered 2022-01-29: 100 [IU] via INTRAMUSCULAR

## 2022-01-29 MED ORDER — SULFAMETHOXAZOLE-TRIMETHOPRIM 800-160 MG PO TABS
1.0000 | ORAL_TABLET | Freq: Once | ORAL | Status: AC
  Administered 2022-01-29: 1 via ORAL

## 2022-01-29 NOTE — Patient Instructions (Signed)
Botulinum Toxin Bladder Injection  A botulinum toxin bladder injection is a procedure to treat an overactive bladder. During the procedure, a drug called botulinum toxin is injected into the bladder through a long, thin needle. This drug relaxes the bladder muscles and reduces overactivity. You may need this procedure if your medicines are not working or you cannot take them. The procedure may be repeated as needed. The treatment is done once and it usually lasts for 6 months. Your health care provider will monitor you to see how well you respond. Tell a health care provider about: Any allergies you have. All medicines you are taking, including vitamins, herbs, eye drops, creams, and over-the-counter medicines. Any problems you or family members have had with anesthetic medicines. Any bleeding problems you have. Any surgeries you have had. Any medical conditions you have. Any previous reactions to a botulinum toxin injection. Any symptoms of urinary tract infection. These include chills, fever, a burning feeling when passing urine, and needing to pass urine often. Whether you are pregnant or may be pregnant. What are the risks? Generally this is a safe procedure. However, problems may occur, including: Not being able to pass urine. If this happens, you may need to have your bladder emptied with a thin tube (urinary catheter). Bleeding. Urinary tract infection. Allergic reaction to the botulinum toxin. Pain or burning when passing urine. Damage to nearby structures or organs. What happens before the procedure? When to stop eating and drinking Follow instructions from your health care provider about what you may eat and drink before your procedure. These may include: 8 hours before the procedure Stop eating most foods. Do not eat meat, fried foods, or fatty foods. Eat only light foods, such as toast or crackers. All liquids are okay except energy drinks and alcohol. 6 hours before the  procedure Stop eating. Drink only clear liquids, such as water, clear fruit juice, black coffee, plain tea, and sports drinks. Do not drink energy drinks or alcohol. 2 hours before the procedure Stop drinking all liquids. You may be allowed to take medicines with small sips of water. If you do not follow your health care provider's instructions, your procedure may be delayed or canceled. Medicines Ask your health care provider about: Changing or stopping your regular medicines. This is especially important if you are taking diabetes medicines or blood thinners. Taking medicines such as aspirin and ibuprofen. These medicines can thin your blood. Do not take these medicines unless your health care provider tells you to take them. Taking over-the-counter medicines, vitamins, herbs, and supplements. General instructions Ask your health care provider what steps will be taken to help prevent infection. These steps may include: Removing hair at the procedure site. Washing skin with a germ-killing soap. Taking antibiotic medicine. If you will be going home right after the procedure, plan to have a responsible adult: Take you home from the hospital or clinic. You will not be allowed to drive. Care for you for the time you are told. What happens during the procedure?  You will be asked to empty your bladder. An IV will be inserted into one of your veins. You will be given one or more of the following: A medicine to help you relax (sedative). A medicine to numb the area (local anesthetic). A medicine to make you fall asleep (general anesthetic). A long, thin scope called a cystoscope will be passed into your bladder through the part of the body that carries urine from your bladder (urethra). The cystoscope   will be used to fill your bladder with water. A long needle will be passed through the cystoscope and into the bladder. The botulinum toxin will be injected into your bladder. It may be  injected into multiple areas of your bladder. The cystoscope will be removed and your bladder will be emptied with a urinary catheter. The procedure may vary among health care providers and hospitals. What can I expect after the procedure? After your procedure, it is common to have: Blood-tinged urine. Burning or soreness when you pass urine. Follow these instructions at home: Medicines Take over-the-counter and prescription medicines only as told by your health care provider. If you were prescribed an antibiotic medicine, take it as told by your health care provider. Do not stop using the antibiotic even if you start to feel better. General instructions  If you were given a sedative during the procedure, it can affect you for several hours. Do not drive or operate machinery until your health care provider says that it is safe. Drink enough fluid to keep your urine pale yellow. Return to your normal activities as told by your health care provider. Ask your health care provider what activities are safe for you. Keep all follow-up visits. Contact a health care provider if you have: A fever or chills. Blood-tinged urine for more than one day after your procedure. Worsening pain or burning when you pass urine. Pain or burning when passing urine for more than two days after your procedure. Trouble emptying your bladder. Get help right away if you: Have bright red blood in your urine. Are unable to pass urine. Summary A botulinum toxin bladder injection is a procedure to treat an overactive bladder. This is generally a safe procedure. However, problems may occur, including not being able to pass urine, bleeding, infection, pain, and an allergic reaction to the botulinum toxin. You will be told when to stop eating and drinking, and what medicines to change or stop. Follow instructions carefully. After the procedure, it is common to have blood in your urine and to have soreness or burning when  passing urine. Contact a health care provider if you have a fever, blood in your urine for more than a few days, or trouble passing urine. Get help right away if you have bright red blood in your urine, or if you are unable to pass urine. This information is not intended to replace advice given to you by your health care provider. Make sure you discuss any questions you have with your health care provider. Document Revised: 01/12/2021 Document Reviewed: 01/12/2021 Elsevier Patient Education  2023 [employer]  

## 2022-01-29 NOTE — Addendum Note (Signed)
Addended by: Verlene Mayer A on: 01/29/2022 02:26 PM   Modules accepted: Orders

## 2022-01-29 NOTE — Telephone Encounter (Signed)
close

## 2022-01-29 NOTE — Progress Notes (Signed)
Bladder Instillation  Due to Botox patient is present today for a Bladder Instillation of 2% Lidocaine. Patient was cleaned and prepped in a sterile fashion with betadine and lidocaine 2% jelly was instilled into the urethra.  A 14FR catheter was inserted, urine return was noted 52m, urine was clear in color.  50 ml was instilled into the bladder. The catheter was then removed. Patient tolerated well, no complications were noted Patient held in bladder for 30 minutes prior to procedure starting.   Performed by: RVerlene Mayer CWeir

## 2022-01-29 NOTE — Progress Notes (Signed)
   01/29/2022  CC:  Chief Complaint  Patient presents with   Over Active Bladder    HPI:   Cheryl Martin is a 88 y.o. female with a personal history of OAB and urge incontinence, who presents today for an office Botox injection.    Preoperative UA/urine culture was obtained.    Patient was administered the appropriate periprocedural antibiotics if indicated.  Lidocaine was allowed to dwell in the bladder for 30 minutes prior to the procedure, see CMA note.  Consent was confirmed.  All questions answered.  Timeout was performed.  Her last injection was on 07/31/2021.   Vitals:   01/29/22 1322  BP: 138/69  Pulse: 65  NED. A&Ox3.   No respiratory distress   Abd soft, NT, ND Normal external genitalia with patent urethral meatus  Cystoscopy Procedure Note   Patient identification was confirmed, informed consent was obtained, and patient was prepped using Betadine solution.  Lidocaine jelly was administered per urethral meatus.     Procedure: - Flexible cystoscope introduced, without any difficulty.   - Thorough search of the bladder revealed:    normal urethral meatus    normal urothelium    no stones    no ulcers     no tumors    no urethral polyps    no trabeculation   - Ureteral orifices were normal in position and appearance.   A Botox injection needle was used to inject a total of 100 units which was reconstituted in a total of 10 cc of saline.  A total of 2 rows of 5 injections (10 mL) was injected into the muscularis of the bladder.  This was well-tolerated.  There is slight oozing from a few of the injection sites but no diffuse bleeding.   Post-Procedure: - Patient tolerated the procedure well   Patient will f/u when she is due for her next Botox injection.   Periprocedual abx given  Assessment and Plan:   1. Overactive bladder - lidocaine (XYLOCAINE) 2 % (with pres) injection 1,000 mg - botulinum toxin Type A (BOTOX) injection 100 Units -  sulfamethoxazole-trimethoprim (BACTRIM DS) 800-160 MG per tablet 1 tablet   2. Urge incontinence - lidocaine (XYLOCAINE) 2 % (with pres) injection 1,000 mg - botulinum toxin Type A (BOTOX) injection 100 Units   F/u 6 mo botox  I,Cheryl Martin,acting as a scribe for [name], MD.,have documented all relevant documentation on the behalf of [name], MD,as directed by  [name], MD while in the presence of [name], MD.  I have reviewed the above documentation for accuracy and completeness, and I agree with the above.   [name], MD

## 2022-02-04 DIAGNOSIS — M5451 Vertebrogenic low back pain: Secondary | ICD-10-CM | POA: Diagnosis not present

## 2022-02-04 DIAGNOSIS — M5416 Radiculopathy, lumbar region: Secondary | ICD-10-CM | POA: Diagnosis not present

## 2022-02-05 DIAGNOSIS — M5416 Radiculopathy, lumbar region: Secondary | ICD-10-CM | POA: Diagnosis not present

## 2022-02-18 DIAGNOSIS — M5451 Vertebrogenic low back pain: Secondary | ICD-10-CM | POA: Diagnosis not present

## 2022-02-18 DIAGNOSIS — M5416 Radiculopathy, lumbar region: Secondary | ICD-10-CM | POA: Diagnosis not present

## 2022-04-10 ENCOUNTER — Encounter: Payer: Self-pay | Admitting: Physician Assistant

## 2022-04-10 ENCOUNTER — Ambulatory Visit (INDEPENDENT_AMBULATORY_CARE_PROVIDER_SITE_OTHER): Payer: Medicare HMO | Admitting: Physician Assistant

## 2022-04-10 VITALS — BP 126/68 | HR 67 | Temp 97.0°F | Ht 63.0 in | Wt 128.0 lb

## 2022-04-10 DIAGNOSIS — Z0000 Encounter for general adult medical examination without abnormal findings: Secondary | ICD-10-CM | POA: Diagnosis not present

## 2022-04-10 NOTE — Patient Instructions (Signed)
Preventive Care 65 Years and Older, Female Preventive care refers to lifestyle choices and visits with your health care provider that can promote health and wellness. Preventive care visits are also called wellness exams. What can I expect for my preventive care visit? Counseling Your health care provider may ask you questions about your: Medical history, including: Past medical problems. Family medical history. Pregnancy and menstrual history. History of falls. Current health, including: Memory and ability to understand (cognition). Emotional well-being. Home life and relationship well-being. Sexual activity and sexual health. Lifestyle, including: Alcohol, nicotine or tobacco, and drug use. Access to firearms. Diet, exercise, and sleep habits. Work and work environment. Sunscreen use. Safety issues such as seatbelt and bike helmet use. Physical exam Your health care provider will check your: Height and weight. These may be used to calculate your BMI (body mass index). BMI is a measurement that tells if you are at a healthy weight. Waist circumference. This measures the distance around your waistline. This measurement also tells if you are at a healthy weight and may help predict your risk of certain diseases, such as type 2 diabetes and high blood pressure. Heart rate and blood pressure. Body temperature. Skin for abnormal spots. What immunizations do I need?  Vaccines are usually given at various ages, according to a schedule. Your health care provider will recommend vaccines for you based on your age, medical history, and lifestyle or other factors, such as travel or where you work. What tests do I need? Screening Your health care provider may recommend screening tests for certain conditions. This may include: Lipid and cholesterol levels. Hepatitis C test. Hepatitis B test. HIV (human immunodeficiency virus) test. STI (sexually transmitted infection) testing, if you are at  risk. Lung cancer screening. Colorectal cancer screening. Diabetes screening. This is done by checking your blood sugar (glucose) after you have not eaten for a while (fasting). Mammogram. Talk with your health care provider about how often you should have regular mammograms. BRCA-related cancer screening. This may be done if you have a family history of breast, ovarian, tubal, or peritoneal cancers. Bone density scan. This is done to screen for osteoporosis. Talk with your health care provider about your test results, treatment options, and if necessary, the need for more tests. Follow these instructions at home: Eating and drinking  Eat a diet that includes fresh fruits and vegetables, whole grains, lean protein, and low-fat dairy products. Limit your intake of foods with high amounts of sugar, saturated fats, and salt. Take vitamin and mineral supplements as recommended by your health care provider. Do not drink alcohol if your health care provider tells you not to drink. If you drink alcohol: Limit how much you have to 0-1 drink a day. Know how much alcohol is in your drink. In the [place] one drink equals one 12 oz bottle of beer (355 mL), one 5 oz glass of wine (148 mL), or one 1 oz glass of hard liquor (44 mL). Lifestyle Brush your teeth every morning and night with fluoride toothpaste. Floss one time each day. Exercise for at least 30 minutes 5 or more days each week. Do not use any products that contain nicotine or tobacco. These products include cigarettes, chewing tobacco, and vaping devices, such as e-cigarettes. If you need help quitting, ask your health care provider. Do not use drugs. If you are sexually active, practice safe sex. Use a condom or other form of protection in order to prevent STIs. Take aspirin only as told by   your health care provider. Make sure that you understand how much to take and what form to take. Work with your health care provider to find out whether it  is safe and beneficial for you to take aspirin daily. Ask your health care provider if you need to take a cholesterol-lowering medicine (statin). Find healthy ways to manage stress, such as: Meditation, yoga, or listening to music. Journaling. Talking to a trusted person. Spending time with friends and family. Minimize exposure to UV radiation to reduce your risk of skin cancer. Safety Always wear your seat belt while driving or riding in a vehicle. Do not drive: If you have been drinking alcohol. Do not ride with someone who has been drinking. When you are tired or distracted. While texting. If you have been using any mind-altering substances or drugs. Wear a helmet and other protective equipment during sports activities. If you have firearms in your house, make sure you follow all gun safety procedures. What's next? Visit your health care provider once a year for an annual wellness visit. Ask your health care provider how often you should have your eyes and teeth checked. Stay up to date on all vaccines. This information is not intended to replace advice given to you by your health care provider. Make sure you discuss any questions you have with your health care provider. Document Revised: 01/03/2021 Document Reviewed: 01/03/2021 Elsevier Patient Education  2023 [employer]  
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
## 2022-04-15 DIAGNOSIS — M5451 Vertebrogenic low back pain: Secondary | ICD-10-CM | POA: Diagnosis not present

## 2022-04-15 DIAGNOSIS — M5416 Radiculopathy, lumbar region: Secondary | ICD-10-CM | POA: Diagnosis not present

## 2022-04-15 DIAGNOSIS — M419 Scoliosis, unspecified: Secondary | ICD-10-CM | POA: Diagnosis not present

## 2022-04-25 DIAGNOSIS — Z008 Encounter for other general examination: Secondary | ICD-10-CM | POA: Diagnosis not present

## 2022-05-14 ENCOUNTER — Ambulatory Visit (INDEPENDENT_AMBULATORY_CARE_PROVIDER_SITE_OTHER): Payer: Medicare HMO | Admitting: Physician Assistant

## 2022-05-14 ENCOUNTER — Encounter: Payer: Self-pay | Admitting: Physician Assistant

## 2022-05-14 VITALS — BP 118/72 | HR 69 | Temp 97.1°F | Ht 63.0 in | Wt 129.0 lb

## 2022-05-14 DIAGNOSIS — R42 Dizziness and giddiness: Secondary | ICD-10-CM | POA: Diagnosis not present

## 2022-05-14 DIAGNOSIS — M549 Dorsalgia, unspecified: Secondary | ICD-10-CM | POA: Diagnosis not present

## 2022-05-14 LAB — POCT URINALYSIS DIPSTICK
Bilirubin, UA: NEGATIVE
Glucose, UA: NEGATIVE
Ketones, UA: NEGATIVE
Leukocytes, UA: NEGATIVE
Nitrite, UA: NEGATIVE
Protein, UA: NEGATIVE
Spec Grav, UA: 1.015 (ref 1.010–1.025)
Urobilinogen, UA: 0.2 E.U./dL
pH, UA: 6 (ref 5.0–8.0)

## 2022-05-14 MED ORDER — MECLIZINE HCL 12.5 MG PO TABS: 12.5000 mg | ORAL_TABLET | Freq: Two times a day (BID) | ORAL | 0 refills | Status: DC | PRN

## 2022-05-14 NOTE — Patient Instructions (Signed)
Dizziness Dizziness is a common problem. It makes you feel unsteady or light-headed. You may feel like you are about to pass out (faint). Dizziness can lead to getting hurt if you stumble or fall. Dizziness can be caused by many things, including: Medicines. Not having enough water in your body (dehydration). Illness. Follow these instructions at home: Eating and drinking  Drink enough fluid to keep your pee (urine) pale yellow. This helps to keep you from getting dehydrated. Try to drink more clear fluids, such as water. Do not drink alcohol. Limit how much caffeine you drink or eat, if your doctor tells you to do that. Limit how much salt (sodium) you drink or eat, if your doctor tells you to do that. Activity  Avoid making quick movements. Stand up slowly from sitting in a chair, and steady yourself until you feel okay. In the morning, first sit up on the side of the bed. When you feel okay, stand up slowly while you hold onto something. Do this until you know that your balance is okay. If you need to stand in one place for a long time, move your legs often. Tighten and relax the muscles in your legs while you are standing. Do not drive or use machinery if you feel dizzy. Avoid bending down if you feel dizzy. Place items in your home so you can reach them easily without leaning over. Lifestyle Do not smoke or use any products that contain nicotine or tobacco. If you need help quitting, ask your doctor. Try to lower your stress level. You can do this by using methods such as yoga or meditation. Talk with your doctor if you need help. General instructions Watch your dizziness for any changes. Take over-the-counter and prescription medicines only as told by your doctor. Talk with your doctor if you think that you are dizzy because of a medicine that you are taking. Tell a friend or a family member that you are feeling dizzy. If he or she notices any changes in your behavior, have this  person call your doctor. Keep all follow-up visits. Contact a doctor if: Your dizziness does not go away. Your dizziness or light-headedness gets worse. You feel like you may vomit (are nauseous). You have trouble hearing. You have new symptoms. You are unsteady on your feet. You feel like the room is spinning. You have neck pain or a stiff neck. You have a fever. Get help right away if: You vomit or have watery poop (diarrhea), and you cannot eat or drink anything. You have trouble: Talking. Walking. Swallowing. Using your arms, hands, or legs. You feel generally weak. You are not thinking clearly, or you have trouble forming sentences. A friend or family member may notice this. You have: Chest pain. Pain in your belly (abdomen). Shortness of breath. Sweating. Your vision changes. You are bleeding. You have a very bad headache. These symptoms may be an emergency. Get help right away. Call your local emergency services (911 in the [place] Do not wait to see if the symptoms will go away. Do not drive yourself to the hospital. Summary Dizziness makes you feel unsteady or light-headed. You may feel like you are about to pass out (faint). Drink enough fluid to keep your pee (urine) pale yellow. Do not drink alcohol. Avoid making quick movements if you feel dizzy. Watch your dizziness for any changes. This information is not intended to replace advice given to you by your health care provider. Make sure you discuss any questions   you have with your health care provider. Document Revised: 06/12/2020 Document Reviewed: 06/12/2020 Elsevier Patient Education  2023 Elsevier Inc.  

## 2022-05-14 NOTE — Progress Notes (Signed)
Established patient acute visit   Patient: Cheryl Martin   DOB: [birth year]   [name]  MRN: [id number] Visit Date: 05/14/2022  Chief Complaint  Patient presents with   Acute Visit   Subjective    HPI  Patient presents for c/o mid-back pain that occurred Sunday night. States had sudden onset of back pain which did not improve with Tylenol, curamin, heating pad, or ice. States was restless that night. States the next day drank lots of water and pain has subsided so unsure if passed a kidney stone. Denies noticing blood in urine. No new medication but did start a new supplement of daily enzymes today. Reports had dizziness Sunday which went away but has dizziness today again. Feels more dizzy when standing up and if she turns her head too quickly. No chest pain, shortness of breath, altered mental status or slurred speech. Patient reports a home nurse suggested she might have a blood flow issue which can be contributing to intermittent left leg pain. Patient denies cold extremities, color changes of lower legs, or pain with walking.    Medications: Outpatient Medications Prior to Visit  Medication Sig   ASPIRIN LOW DOSE 81 MG EC tablet TAKE 1 TABLET BY MOUTH EVERY DAY   Bioflavonoid Products (VITAMIN C) CHEW Chew 1 tablet by mouth daily.   BIOTIN PO Take 1 tablet by mouth daily.   Cholecalciferol (VITAMIN D-3 PO) Take 1,000 mcg by mouth daily with breakfast.   Cyanocobalamin (B-12) 1000 MCG SUBL Place 2,000 mcg under the tongue daily.   Melatonin 3 MG CAPS Take 3 mg by mouth at bedtime as needed (sleep).   Multiple Vitamin (MULTIVITAMIN WITH MINERALS) TABS tablet Take 1 tablet by mouth daily.   polyethylene glycol (MIRALAX / GLYCOLAX) 17 g packet Take 17 g by mouth every other day.   Sodium Chloride, Hypertonic, (MURO 128 OP) Place 1 drop into both eyes See admin instructions. Instill 1 drop into each eye in the morning, then as needed daily for dryness/irritation   acetaminophen  (TYLENOL) 500 MG tablet Take 1,000 mg by mouth every 6 (six) hours as needed for moderate pain or headache. (Patient not taking: Reported on 05/14/2022)   nitroGLYCERIN (NITROSTAT) 0.4 MG SL tablet Place 1 tablet (0.4 mg total) under the tongue every 5 (five) minutes as needed for chest pain.   No facility-administered medications prior to visit.    Review of Systems Review of Systems:  A fourteen system review of systems was performed and found to be positive as per HPI.  Last CBC Lab Results  Component Value Date   WBC 4.8 11/27/2021   HGB 12.3 11/27/2021   HCT 36.4 11/27/2021   MCV 93.1 11/27/2021   MCH 31.5 11/27/2021   RDW 13.1 11/27/2021   PLT 188 76/72/0947   Last metabolic panel Lab Results  Component Value Date   GLUCOSE 88 01/14/2022   NA 141 01/14/2022   K 4.5 01/14/2022   CL 102 01/14/2022   CO2 22 01/14/2022   BUN 11 01/14/2022   CREATININE 0.66 01/14/2022   EGFR 84 01/14/2022   CALCIUM 9.4 01/14/2022   PHOS 3.3 11/27/2021   PROT 6.1 01/14/2022   ALBUMIN 4.4 01/14/2022   LABGLOB 1.7 01/14/2022   AGRATIO 2.6 (H) 01/14/2022   BILITOT 0.4 01/14/2022   ALKPHOS 83 01/14/2022   AST 24 01/14/2022   ALT 17 01/14/2022   ANIONGAP 6 11/27/2021   Last lipids Lab Results  Component Value Date  CHOL 148 01/14/2022   HDL 71 01/14/2022   LDLCALC 63 01/14/2022   TRIG 73 01/14/2022   CHOLHDL 2.1 01/14/2022   Last hemoglobin A1c Lab Results  Component Value Date   HGBA1C 5.0 11/27/2021   Last thyroid functions Lab Results  Component Value Date   TSH 2.580 03/29/2021   Last vitamin D Lab Results  Component Value Date   VD25OH 42.1 03/02/2020     Objective    BP 118/72 (BP Location: Left Arm, Patient Position: Sitting, Cuff Size: Normal)   Pulse 69   Temp (!) 97.1 F (36.2 C) (Temporal)   Ht 5' 3"  (1.6 m)   Wt 129 lb (58.5 kg)   BMI 22.85 kg/m    Physical Exam  General:  Pleasant and cooperative, appropriate for stated age.  Neuro:  Alert and  oriented,  extra-ocular muscles intact, no focal deficits, slight nystagmus noted with peripheral gaze to the right   HEENT:  Normocephalic, atraumatic, neck supple  Skin:  no gross rash, warm, pink. Cardiac:  RRR, S1 S2 Respiratory: CTA B/L  Vascular:  Ext warm, no cyanosis apprec.; good pedal pulses, no pitting edema Psych:  No HI/SI, judgement and insight good, Euthymic mood. Full Affect.   Results for orders placed or performed in visit on 05/14/22  POCT Urinalysis Dipstick  Result Value Ref Range   Color, UA yellow    Clarity, UA clear    Glucose, UA Negative Negative   Bilirubin, UA negative    Ketones, UA negative    Spec Grav, UA 1.015 1.010 - 1.025   Blood, UA trace-intact    pH, UA 6.0 5.0 - 8.0   Protein, UA Negative Negative   Urobilinogen, UA 0.2 0.2 or 1.0 E.U./dL   Nitrite, UA negative    Leukocytes, UA Negative Negative   Appearance     Odor      Assessment & Plan     Discussed with patient s/sx are suggestive of possible kidney stone. UA collected today, relatively unremarkable with just a trace of intact blood which is chronic. Mid-back pain has resolved. Orthostatic blood pressures obtained and negative. Discussed with patient likely has episodic vertigo triggered by rapid head movements. Dizziness is not a new symptom, previously seen for same complaint. Discussed slow position changes and patient is agreeable to trial meclizine 12.5 mg twice daily as needed for dizziness. Recommend to continue with good hydration. Currently no red flag s/sx present concerning for CVA. If dizziness fails to improve recommend discussing with neurologist as well. Advised patient if continues to get dizzy after taking her daily enzyme supplement then recommend stopping medication. Patient prefers to wait on vascular ultrasound to evaluate for PAD, advised does not have the usual s/sx associated with PAD but does have risk factors so reasonable to obtain imaging in the future, especially  if develops new symptoms. Pt verbalized understanding.   Return if symptoms worsen or fail to improve.        Lorrene Reid, PA-C  [facility] [phone] (phone) [phone] (fax)  [place]

## 2022-06-03 DIAGNOSIS — H35373 Puckering of macula, bilateral: Secondary | ICD-10-CM | POA: Diagnosis not present

## 2022-06-03 DIAGNOSIS — H04123 Dry eye syndrome of bilateral lacrimal glands: Secondary | ICD-10-CM | POA: Diagnosis not present

## 2022-06-03 DIAGNOSIS — Z961 Presence of intraocular lens: Secondary | ICD-10-CM | POA: Diagnosis not present

## 2022-06-03 DIAGNOSIS — D3132 Benign neoplasm of left choroid: Secondary | ICD-10-CM | POA: Diagnosis not present

## 2022-06-03 DIAGNOSIS — H18593 Other hereditary corneal dystrophies, bilateral: Secondary | ICD-10-CM | POA: Diagnosis not present

## 2022-06-03 DIAGNOSIS — H31101 Choroidal degeneration, unspecified, right eye: Secondary | ICD-10-CM | POA: Diagnosis not present

## 2022-06-05 ENCOUNTER — Ambulatory Visit: Payer: Medicare HMO | Attending: Cardiology | Admitting: Cardiology

## 2022-06-05 ENCOUNTER — Telehealth: Payer: Self-pay | Admitting: Neurology

## 2022-06-05 ENCOUNTER — Encounter: Payer: Self-pay | Admitting: Cardiology

## 2022-06-05 ENCOUNTER — Telehealth: Payer: Self-pay

## 2022-06-05 VITALS — BP 124/60 | HR 64 | Ht 63.0 in | Wt 129.2 lb

## 2022-06-05 DIAGNOSIS — G459 Transient cerebral ischemic attack, unspecified: Secondary | ICD-10-CM
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
## 2022-06-05 NOTE — Telephone Encounter (Signed)
Sherry @ [facility]  is asking for a call back to confirm if the appointment scheduled for pt is needed today, Judeen Hammans says if she doesn't call the 1st time you call please call right back, her # is [phone]

## 2022-06-05 NOTE — Telephone Encounter (Signed)
Patient states that the a year ago she noticed a knot on the back right side of her head. In the past two weeks the pain has increased in that spot where the mass is. Patient was evaluated by her Optometrist on 06/03/22 and was told that the pain is not coming from eyes. Patient is concerned that the MRI w/o contrast was not adequate and would like to know if she needs to see a Neurologist.  Please advise

## 2022-06-05 NOTE — Telephone Encounter (Signed)
Pt is requesting a callback in regards to the pain between shoulder blades.  Pt states the MRI that was completed was done without contrast. Pt is hoping that Provider could give some insight on what it could be.  Pt can be reached at [phone]

## 2022-06-05 NOTE — Telephone Encounter (Signed)
I called Judeen Hammans with cardiology. Pt is scheduled for f/u appt today with Dr. Curt Bears and Judeen Hammans needs clarification if Dr. April Manson would like to move forward with loop recorder placement?  I reviewed last office visit from our office in June and note states to follow up with cardiology as scheduled but not specific to loop recorder discussion.  This appt today with cardiology is specific to EP and would focus on discussion about loop recorder.

## 2022-06-05 NOTE — Telephone Encounter (Signed)
Ok to pursue loop recorder. Patient TIA sx were aphasia (cortical sign)

## 2022-06-05 NOTE — Telephone Encounter (Signed)
Cardiology updated on this via secure messaging. I sent a direct message to Trinidad Curet RN and confirmation was received.

## 2022-06-05 NOTE — Patient Instructions (Signed)
Medication Instructions:  Your physician recommends that you continue on your current medications as directed. Please refer to the Current Medication list given to you today.  Labwork: None ordered.  Testing/Procedures: None ordered.  Follow-Up: As needed with Dr. Curt Bears. We will call you if we find any irregular heart rhythm on the loop monitor    Implantable Loop Recorder Placement, Care After This sheet gives you information about how to care for yourself after your procedure. Your health care provider may also give you more specific instructions. If you have problems or questions, contact your health care provider. What can I expect after the procedure? After the procedure, it is common to have: Soreness or discomfort near the incision. Some swelling or bruising near the incision.  Follow these instructions at home: Incision care  Monitor your cardiac device site for redness, swelling, and drainage. Call the device clinic at [phone] if you experience these symptoms or fever/chills.  Keep the large square bandage on your site for 24 hours and then you may remove it yourself. Keep the steri-strips underneath in place.   You may shower after 72 hours / 3 days from your procedure with the steri-strips in place. They will usually fall off on their own, or may be removed after 10 days. Pat dry.   Avoid lotions, ointments, or perfumes over your incision until it is well-healed.  Please do not submerge in water until your site is completely healed.   Your device is MRI compatible.   Remote monitoring is used to monitor your cardiac device from home. This monitoring is scheduled every month by our office. It allows [place] to keep an eye on the function of your device to ensure it is working properly.  If your wound site starts to bleed apply pressure.    For help with the monitor please call Medtronic Monitor Support Specialist directly at [phone].    If you have any  questions/concerns please call the device clinic at [phone].  Activity  Return to your normal activities.  General instructions Follow instructions from your health care provider about how to manage your implantable loop recorder and transmit the information. Learn how to activate a recording if this is necessary for your type of device. You may go through a metal detection gate, and you may let someone hold a metal detector over your chest. Show your ID card if needed. Do not have an MRI unless you check with your health care provider first. Take over-the-counter and prescription medicines only as told by your health care provider. Keep all follow-up visits as told by your health care provider. This is important. Contact a health care provider if: You have redness, swelling, or pain around your incision. You have a fever. You have pain that is not relieved by your pain medicine. You have triggered your device because of fainting (syncope) or because of a heartbeat that feels like it is racing, slow, fluttering, or skipping (palpitations). Get help right away if you have: Chest pain. Difficulty breathing. Summary After the procedure, it is common to have soreness or discomfort near the incision. Change your dressing as told by your health care provider. Follow instructions from your health care provider about how to manage your implantable loop recorder and transmit the information. Keep all follow-up visits as told by your health care provider. This is important. This information is not intended to replace advice given to you by your health care provider. Make sure you discuss any questions you have  with your health care provider. Document Released: 06/19/2015 Document Revised: 08/23/2017 Document Reviewed: 08/23/2017 Elsevier Patient Education  2020 [employer].

## 2022-06-06 NOTE — Telephone Encounter (Signed)
Spoke with patient and informed her that she has the option to come in for evaluation to be referred to Dermatology or wait to follow up with Neuro. Patient states she will follow up with Neuro.

## 2022-07-04 ENCOUNTER — Ambulatory Visit: Payer: Medicare HMO | Admitting: Neurology

## 2022-07-04 ENCOUNTER — Encounter: Payer: Self-pay | Admitting: Neurology

## 2022-07-04 VITALS — BP 154/88 | HR 68 | Ht 63.0 in | Wt 131.0 lb

## 2022-07-04 DIAGNOSIS — G629 Polyneuropathy, unspecified: Secondary | ICD-10-CM | POA: Diagnosis not present

## 2022-07-04 DIAGNOSIS — G44209 Tension-type headache, unspecified, not intractable: Secondary | ICD-10-CM

## 2022-07-04 DIAGNOSIS — F02A0 Dementia in other diseases classified elsewhere, mild, without behavioral disturbance, psychotic disturbance, mood disturbance, and anxiety: Secondary | ICD-10-CM | POA: Diagnosis not present

## 2022-07-04 DIAGNOSIS — G301 Alzheimer's disease with late onset: Secondary | ICD-10-CM | POA: Diagnosis not present

## 2022-07-04 DIAGNOSIS — G3184 Mild cognitive impairment, so stated: Secondary | ICD-10-CM

## 2022-07-04 MED ORDER — DONEPEZIL HCL 5 MG PO TABS: 5.0000 mg | ORAL_TABLET | Freq: Every day | ORAL | 11 refills | Status: DC

## 2022-07-04 MED ORDER — GABAPENTIN 100 MG PO CAPS: 100.0000 mg | ORAL_CAPSULE | Freq: Every day | ORAL | 0 refills | Status: DC

## 2022-07-04 NOTE — Patient Instructions (Signed)
Continue current medications  Consider Magnesium oxide 400 mg nightly for the headaches  Start Aricept 5 mg at bedtime  Start Gabapentin 100 mg at bedtime for the neuropathy  ATN profile today  Follow up in 6 months
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
## 2022-07-05 ENCOUNTER — Other Ambulatory Visit: Payer: Self-pay | Admitting: Internal Medicine

## 2022-07-09 ENCOUNTER — Other Ambulatory Visit: Payer: Self-pay | Admitting: Neurology

## 2022-07-09 ENCOUNTER — Telehealth: Payer: Self-pay | Admitting: Internal Medicine

## 2022-07-09 LAB — ATN PROFILE
A -- Beta-amyloid 42/40 Ratio: 0.093 — ABNORMAL LOW (ref >0.102)
Beta-amyloid 40: 230.92 pg/mL
Beta-amyloid 42: 21.47 pg/mL
N -- NfL, Plasma: 3.48 pg/mL (ref 0.00–11.55)
T -- p-tau181: 2.29 pg/mL — ABNORMAL HIGH (ref 0.00–0.97)

## 2022-07-10 ENCOUNTER — Other Ambulatory Visit: Payer: Self-pay | Admitting: Acute Care

## 2022-07-10 DIAGNOSIS — U071 COVID-19: Secondary | ICD-10-CM

## 2022-07-10 MED ORDER — MOLNUPIRAVIR EUA 200MG CAPSULE: 4.0000 | ORAL_CAPSULE | Freq: Two times a day (BID) | ORAL | 0 refills | Status: AC

## 2022-07-10 NOTE — Telephone Encounter (Signed)
Called and spoke with pt. Pt said she had symptoms that began 3 days ago including postnasal drainage, dry cough that is sometimes wet, fogginess, weakness. Not sure if she has been running a temp as her thermometer has not been working right.  Pt had a zpak called in yesterday 12/19 but states that today 12/20 she took a covid test which came back positive.  Sarah, please advise if we can send something to the pharmacy for pt to help treat the covid infection.

## 2022-07-14 ENCOUNTER — Encounter: Payer: Self-pay | Admitting: Nurse Practitioner

## 2022-07-14 ENCOUNTER — Encounter: Payer: Self-pay | Admitting: Neurology

## 2022-07-16 ENCOUNTER — Ambulatory Visit (INDEPENDENT_AMBULATORY_CARE_PROVIDER_SITE_OTHER): Payer: Medicare HMO

## 2022-07-16 DIAGNOSIS — G459 Transient cerebral ischemic attack, unspecified: Secondary | ICD-10-CM | POA: Diagnosis not present

## 2022-07-16 LAB — CUP PACEART REMOTE DEVICE CHECK
Date Time Interrogation Session: 20231223203146
Implantable Pulse Generator Implant Date: 20231115

## 2022-07-18 NOTE — Telephone Encounter (Signed)
Per patient's chart, an antiviral was sent on 12/20. Will close encounter.

## 2022-07-30 ENCOUNTER — Telehealth (INDEPENDENT_AMBULATORY_CARE_PROVIDER_SITE_OTHER): Payer: Medicare HMO | Admitting: Internal Medicine

## 2022-07-30 VITALS — BP 120/80 | Ht 63.0 in | Wt 124.0 lb

## 2022-07-30 DIAGNOSIS — J479 Bronchiectasis, uncomplicated: Secondary | ICD-10-CM

## 2022-07-30 MED ORDER — AZITHROMYCIN 250 MG PO TABS: ORAL_TABLET | ORAL | 11 refills | Status: DC

## 2022-07-30 NOTE — Patient Instructions (Signed)
No return in medications   Please schedule a follow up visit in 3 months but call sooner if needed with a cxr on return

## 2022-07-30 NOTE — Progress Notes (Signed)
Cheryl Martin, female    DOB: [birth year],    MRN: [id number]   Brief patient profile:  64 yowf MM/quit smoking 1975 moved from [place] 1984 with recurrent episodes of  bronchitis variable times every since arrival in [place] with some seasonal rhinitis esp in Spring and abnormal cxr since around 2017 with new dx of bronchiectasis by CT chest  01/12/2019 so referred to pulmonary clinic 03/02/2019 by Dr   Raliegh Scarlet     History of Present Illness  03/02/2019  Pulmonary/ 1st office eval/Cheryl Martin  Chief Complaint  Patient presents with   Pulmonary Consult    Referred by Dr Raliegh Scarlet for bronchiectasis.    Dyspnea:  Can walk 25 min flat around a track each lap takes a min avg pace -faster most people her age Cough: just with flares / really not significant production chronically, just during flares of what she's been calling bronchitis which is only once a year at most.  Sleep: nasal congestion but doesn't keep her up, one pillow  SABA use: none New abd pain x 4 months no relation with meals  rec GERD diet  Bronchiectasis =    Whenever you develop cough congestion take mucinex or mucinex dm up to 1200 mg every 12 hours as needed        04/15/2019  f/u ov/Cheryl Martin re: bronchiectasis  Chief Complaint  Patient presents with   Follow-up    Breathing is doing well. She has occ throat clearing.   Dyspnea:  Walking track > no change in ex tol  Cough: none x with flares , last jan/feb 2020  Sleeping: some nasal congestion / doesn't really keep her up  SABA use: none  02: none  rec In the event of a flare of cough with excess or nasty mucus > refillable zpak  pfts on return > wnl    12/11/2020  f/u ov/Cheryl Martin re: bronchiectasis on prn zpak Chief Complaint  Patient presents with   Follow-up    Breathing is overall doing well. She has had slight increase in cough past few days, non prod and she relates to pollen allergy.   Dyspnea:  Not limited by breathing from desired activities   Cough: last zpak x 7mno  color to mucus  Sleeping: min nasal congestion noct, o/w no resp sympoms SABA use: none 02: none  Rec No change rx     12/11/2021  Virtual  ov/Cheryl Martin re: bronchiectasis maint on zpak prn   No chief complaint on file.    I connected with Cheryl Martin 12/11/21 at  1:30 PM EDT by Mychart video  and verified that I am speaking with the correct person using two identifiers. Pt is at home and this call made from my office with no other participants    I discussed the limitations, risks, security and privacy concerns of performing an evaluation and management service by telephone and the availability of in person appointments. I also discussed with the patient that there may be a patient responsible charge related to this service. The patient expressed understanding and agreed to proceed.  Dyspnea:  no change  but very sedentary Cough: just watery pnd Sleeping: able to lie flat/ one pillow  SABA use: none  02: none Rec Since she was unable to come in today needs ov in 6 months with cxr.     Virtual Visit via Telephone Note 07/30/2022  s/p covid Dec 14th 2023 rx zmax  I connected with Cheryl Martin 07/30/22  at  9:45 AM EST by telephone and verified that I am speaking with the correct person using two identifiers. Pt is at home and this call made from my office with no other participants    I discussed the limitations, risks, security and privacy concerns of performing an evaluation and management service by telephone and the availability of in person appointments. I also discussed with the patient that there may be a patient responsible charge related to this service. The patient expressed understanding and agreed to proceed.   History of Present Illness: Dyspnea:  grocery store walking / [employer] or [employer]/slowed more back  Cough: not much/ still pnds Sleeping: flat bed one pillow  SABA use: none  02: none    No obvious day to day or daytime variability or assoc excess/  purulent sputum or mucus plugs or hemoptysis or cp or chest tightness, subjective wheeze or overt sinus or hb symptoms.    Also denies any obvious fluctuation of symptoms with weather or environmental changes or other aggravating or alleviating factors except as outlined above.   Meds reviewed/ med reconciliation completed     No outpatient medications have been marked as taking for the 07/30/22 encounter (Appointment) with Tanda Rockers, MD.         Observations/Objective: Looks great on screen, no increased wob / good voice texture/ no rattling on voluntary cough         Past Medical History:  Diagnosis Date   ADD (attention deficit disorder)    Allergy    Anemia    as a teenager   Arthritis    Complication of anesthesia    slow to wake up after colonoscopy   Edema    Hearing loss    History of hiatal hernia    hx of   Hypothyroidism    hx of as child and during pregnancy   Neuromuscular disorder ([place])    Nocturia    Pneumonia    hx of after bronchitis   PONV (postoperative nausea and vomiting)    Thyroid disease   Hands feet swelling      Objective:       12/11/2020       136  06/13/2020     136  09/21/2019         140  04/15/19 138 lb (62.6 kg)  04/08/19 137 lb (62.1 kg)  03/15/19 136 lb (61.7 kg)       Assessment and Plan: See problem list for active a/p's   Follow Up Instructions: See avs for instructions unique to this ov which includes revised/ updated med list     I discussed the assessment and treatment plan with the patient. The patient was provided an opportunity to ask questions and all were answered. The patient agreed with the plan and demonstrated an understanding of the instructions.   The patient was advised to call back or seek an in-person evaluation if the symptoms worsen or if the condition fails to improve as anticipated.  I provided *** minutes of non-face-to-face time during this encounter.   Cheryl Gully, MD       Assessment

## 2022-07-31 ENCOUNTER — Encounter: Payer: Self-pay | Admitting: Internal Medicine

## 2022-07-31 NOTE — Assessment & Plan Note (Signed)
Onset   ? around 2017  - see CT chest 01/12/2019 - Labs ordered 03/02/2019     alpha one AT phenotype  MM level 147 - Allergy profile 03/02/19 >  Eos 0.1  /  IgE  13 RAST neg  - Ig profile: 03/02/2019   Minimal decrease IgM and IgG - 04/15/2019 added prn cycles of zpak for flares  - PFT's  09/21/2019  FEV1 2.22 (128 % ) ratio 0.75  p 4 % improvement from saba p nothing prior to study  Adequate control on present rx, reviewed in detail with pt > no change in rx needed    Needs f/u cxr at next ov in person / advised         Each maintenance medication was reviewed in detail including emphasizing most importantly the difference between maintenance and prns and under what circumstances the prns are to be triggered using an action plan format where appropriate.  Total time for H and P, chart review, counseling,  and generating customized AVS unique to this office visit / same day charting = 12 min

## 2022-08-06 ENCOUNTER — Ambulatory Visit: Payer: Medicare HMO | Admitting: Urology

## 2022-08-06 ENCOUNTER — Encounter: Payer: Self-pay | Admitting: Urology

## 2022-08-06 VITALS — BP 137/81 | HR 71

## 2022-08-06 DIAGNOSIS — Z9229 Personal history of other drug therapy: Secondary | ICD-10-CM

## 2022-08-06 DIAGNOSIS — N3941 Urge incontinence: Secondary | ICD-10-CM | POA: Diagnosis not present

## 2022-08-06 DIAGNOSIS — N3281 Overactive bladder: Secondary | ICD-10-CM

## 2022-08-06 LAB — MICROSCOPIC EXAMINATION: Bacteria, UA: NONE SEEN

## 2022-08-06 LAB — URINALYSIS, COMPLETE
Bilirubin, UA: NEGATIVE
Glucose, UA: NEGATIVE
Ketones, UA: NEGATIVE
Leukocytes,UA: NEGATIVE
Nitrite, UA: NEGATIVE
Protein,UA: NEGATIVE
Specific Gravity, UA: 1.015 (ref 1.005–1.030)
Urobilinogen, Ur: 0.2 mg/dL (ref 0.2–1.0)
pH, UA: 7 (ref 5.0–7.5)

## 2022-08-06 MED ORDER — CEPHALEXIN 250 MG PO CAPS
500.0000 mg | ORAL_CAPSULE | Freq: Once | ORAL | Status: AC
  Administered 2022-08-06: 500 mg via ORAL

## 2022-08-06 MED ORDER — ONABOTULINUMTOXINA 100 UNITS IJ SOLR
100.0000 [IU] | Freq: Once | INTRAMUSCULAR | Status: AC
  Administered 2022-08-06: 100 [IU] via INTRAMUSCULAR

## 2022-08-06 MED ORDER — AZITHROMYCIN 250 MG PO TABS: ORAL_TABLET | ORAL | 0 refills | Status: DC

## 2022-08-06 NOTE — Progress Notes (Signed)
   08/06/22   CC:  Chief Complaint  Patient presents with   Botulinum Toxin Injection    HPI:   Cheryl Martin is a 89 y.o. female with a personal history of OAB and urge incontinence, who presents today for an office Botox injection.    Patient was administered the appropriate periprocedural antibiotics if indicated.  Lidocaine was allowed to dwell in the bladder for 30 minutes prior to the procedure, see CMA note.  Consent was confirmed.  All questions answered.  Timeout was performed.  Her last injection was on 01/2022.  Vitals:   08/06/22 1122  BP: 137/81  Pulse: 71  NED. A&Ox3.   No respiratory distress   Abd soft, NT, ND Normal external genitalia with patent urethral meatus  Cystoscopy Procedure Note   Patient identification was confirmed, informed consent was obtained, and patient was prepped using Betadine solution.  Lidocaine jelly was administered per urethral meatus.     Procedure: - Flexible cystoscope introduced, without any difficulty.   - Thorough search of the bladder revealed:    normal urethral meatus    normal urothelium    no stones    no ulcers     no tumors    no urethral polyps    no trabeculation   - Ureteral orifices were normal in position and appearance.   A Botox injection needle was used to inject a total of 100 units which was reconstituted in a total of 10 cc of saline.  A total of 2 rows of 5 injections (10 mL) was injected into the muscularis of the bladder.  This was well-tolerated.  There is slight oozing from a few of the injection sites but no diffuse bleeding.   Post-Procedure: - Patient tolerated the procedure well   Patient will f/u when she is due for her next Botox injection.   Periprocedual abx given  Assessment and Plan:   1. Overactive bladder - lidocaine (XYLOCAINE) 2 % (with pres) injection 1,000 mg - botulinum toxin Type A (BOTOX) injection 100 Units - Keflex 500 mg PO x 1   2. Urge incontinence -  lidocaine (XYLOCAINE) 2 % (with pres) injection 1,000 mg - botulinum toxin Type A (BOTOX) injection 100 Units   F/u 6 mo botox documentation for accuracy and completeness, and I agree with the above.   [name], MD

## 2022-08-08 DIAGNOSIS — M5451 Vertebrogenic low back pain: Secondary | ICD-10-CM | POA: Diagnosis not present

## 2022-08-08 DIAGNOSIS — M4310 Spondylolisthesis, site unspecified: Secondary | ICD-10-CM | POA: Diagnosis not present

## 2022-08-08 DIAGNOSIS — M5416 Radiculopathy, lumbar region: Secondary | ICD-10-CM | POA: Diagnosis not present

## 2022-08-08 DIAGNOSIS — M5459 Other low back pain: Secondary | ICD-10-CM | POA: Diagnosis not present

## 2022-08-09 ENCOUNTER — Telehealth: Payer: Self-pay | Admitting: *Deleted

## 2022-08-09 ENCOUNTER — Emergency Department (HOSPITAL_BASED_OUTPATIENT_CLINIC_OR_DEPARTMENT_OTHER): Payer: Medicare HMO

## 2022-08-09 ENCOUNTER — Emergency Department (HOSPITAL_COMMUNITY)
Admission: EM | Admit: 2022-08-09 | Discharge: 2022-08-09 | Disposition: A | Discharge status: Home / Self Care | Payer: Medicare HMO | Attending: Emergency Medicine | Admitting: Emergency Medicine

## 2022-08-09 ENCOUNTER — Emergency Department (HOSPITAL_COMMUNITY): Payer: Medicare HMO

## 2022-08-09 ENCOUNTER — Encounter (HOSPITAL_COMMUNITY): Payer: Self-pay

## 2022-08-09 ENCOUNTER — Other Ambulatory Visit: Payer: Self-pay

## 2022-08-09 DIAGNOSIS — I70209 Unspecified atherosclerosis of native arteries of extremities, unspecified extremity: Secondary | ICD-10-CM | POA: Diagnosis not present

## 2022-08-09 DIAGNOSIS — Z008 Encounter for other general examination: Secondary | ICD-10-CM | POA: Diagnosis not present

## 2022-08-09 DIAGNOSIS — Z823 Family history of stroke: Secondary | ICD-10-CM | POA: Diagnosis not present

## 2022-08-09 DIAGNOSIS — M79662 Pain in left lower leg: Secondary | ICD-10-CM | POA: Diagnosis not present

## 2022-08-09 DIAGNOSIS — M1990 Unspecified osteoarthritis, unspecified site: Secondary | ICD-10-CM | POA: Diagnosis not present

## 2022-08-09 DIAGNOSIS — Z7982 Long term (current) use of aspirin: Secondary | ICD-10-CM | POA: Insufficient documentation

## 2022-08-09 DIAGNOSIS — Z888 Allergy status to other drugs, medicaments and biological substances status: Secondary | ICD-10-CM | POA: Diagnosis not present

## 2022-08-09 DIAGNOSIS — N3941 Urge incontinence: Secondary | ICD-10-CM | POA: Diagnosis not present

## 2022-08-09 DIAGNOSIS — H9192 Unspecified hearing loss, left ear: Secondary | ICD-10-CM | POA: Diagnosis not present

## 2022-08-09 DIAGNOSIS — M79605 Pain in left leg: Secondary | ICD-10-CM | POA: Diagnosis not present

## 2022-08-09 DIAGNOSIS — Z833 Family history of diabetes mellitus: Secondary | ICD-10-CM | POA: Diagnosis not present

## 2022-08-09 DIAGNOSIS — K5900 Constipation, unspecified: Secondary | ICD-10-CM | POA: Diagnosis not present

## 2022-08-09 DIAGNOSIS — I2510 Atherosclerotic heart disease of native coronary artery without angina pectoris: Secondary | ICD-10-CM | POA: Diagnosis not present

## 2022-08-09 DIAGNOSIS — Z809 Family history of malignant neoplasm, unspecified: Secondary | ICD-10-CM | POA: Diagnosis not present

## 2022-08-09 DIAGNOSIS — H269 Unspecified cataract: Secondary | ICD-10-CM | POA: Diagnosis not present

## 2022-08-09 DIAGNOSIS — Z87891 Personal history of nicotine dependence: Secondary | ICD-10-CM | POA: Diagnosis not present

## 2022-08-09 DIAGNOSIS — R252 Cramp and spasm: Secondary | ICD-10-CM | POA: Diagnosis not present

## 2022-08-09 DIAGNOSIS — R42 Dizziness and giddiness: Secondary | ICD-10-CM | POA: Diagnosis not present

## 2022-08-09 LAB — CBC
HCT: 35.8 % — ABNORMAL LOW (ref 36.0–46.0)
Hemoglobin: 12.2 g/dL (ref 12.0–15.0)
MCH: 31 pg (ref 26.0–34.0)
MCHC: 34.1 g/dL (ref 30.0–36.0)
MCV: 91.1 fL (ref 80.0–100.0)
Platelets: 192 10*3/uL (ref 150–400)
RBC: 3.93 MIL/uL (ref 3.87–5.11)
RDW: 13.2 % (ref 11.5–15.5)
WBC: 4.1 10*3/uL (ref 4.0–10.5)
nRBC: 0 % (ref 0.0–0.2)

## 2022-08-09 LAB — BASIC METABOLIC PANEL
Anion gap: 6 (ref 5–15)
BUN: 11 mg/dL (ref 8–23)
CO2: 26 mmol/L (ref 22–32)
Calcium: 9.3 mg/dL (ref 8.9–10.3)
Chloride: 103 mmol/L (ref 98–111)
Creatinine, Ser: 0.69 mg/dL (ref 0.44–1.00)
GFR, Estimated: >60 mL/min (ref >60)
Glucose, Bld: 93 mg/dL (ref 70–99)
Potassium: 4 mmol/L (ref 3.5–5.1)
Sodium: 135 mmol/L (ref 135–145)

## 2022-08-09 LAB — MAGNESIUM: Magnesium: 2.2 mg/dL (ref 1.7–2.4)

## 2022-08-09 MED ORDER — METHOCARBAMOL 500 MG PO TABS: 500.0000 mg | ORAL_TABLET | Freq: Two times a day (BID) | ORAL | 0 refills | Status: DC | PRN

## 2022-08-09 MED ORDER — METHYLPREDNISOLONE 4 MG PO TBPK: ORAL_TABLET | ORAL | 0 refills | Status: DC

## 2022-08-09 NOTE — Telephone Encounter (Signed)
Spoke with daughter and they are at the ED right now and the dr was in the room and can not find anything wrong with her leg. Daughter will call back if she has additional questions

## 2022-08-09 NOTE — Progress Notes (Signed)
Lower extremity arterial left and ABI w/ TBI study completed.  Preliminary results relayed to [place], DO and [place], PA.   See CV Proc for preliminary results report.   Darlin Coco, RDMS, RVT

## 2022-08-09 NOTE — ED Triage Notes (Signed)
Pt came in from her PCP d/t pt having sever Rt leg pain that is also swollen & difficulty finding pulse in that Rt foot. Hx of scoliosis & sciatica & decreased blood flow to that limb. Foot cold in triage, endorses worse pain in the lower leg area, rates it 5/10. A/Ox4. Provider from PCP called & also stated she was having difficulty breathing & it was worse when she laid down. Doppler used on pulse in Rt foot in triage.

## 2022-08-09 NOTE — Telephone Encounter (Signed)
Contacted pt and gave her the below message and pt said her daughter was on the phone with another provider and she said that she will either go to see another provider or she will go to the ED.   Message from provider -with the pain and severity of the findings, I would advise she go to the ED. I have not seen this patient in the past, so this is the best and fastest way for her to be seen

## 2022-08-09 NOTE — Telephone Encounter (Signed)
With the amount of pain the patient is having and seeing compete occlusion of the vein, she should be seen in ED as soon as possible.

## 2022-08-09 NOTE — Telephone Encounter (Signed)
Cheryl Martin with [facility] calling with critical report.  She is out with patient and performed a PAD test.  Findings are Severe occulsion to left and moderate to right. A lot of pain on left side. Left-0.14, Right-0.59. Please advise.

## 2022-08-09 NOTE — Discharge Instructions (Addendum)
As discussed I recommend you follow-up with vascular surgery to discuss your ultrasound results today.  That may be playing a role in some of the discomfort that you are having at times.  I have prescribed you a Medrol Dosepak to help with any inflammation that could be coming from pinched nerve as well.  Follow-up with your primary care doctor and neurosurgery as you have scheduled.  I have prescribed you a muscle relaxant called Robaxin.  It appears that you have an allergy to Flexeril in the past which I was going to prescribe you.  Consider filling this medication if you would like as it may provide some additional support from a muscle spasm standpoint.  This medication is sedating and can make you sleepy so please be careful with its use.

## 2022-08-09 NOTE — ED Provider Triage Note (Signed)
Emergency Medicine Provider Triage Evaluation Note  LATREASE KUNDE , a 89 y.o. female  was evaluated in triage.  Pt complains of left lower leg pain for some time, significantly worse in the past several days. Home health nurse came to the house and performed a "test" and told her the results were severe and she needed to go to the ER. Left foot has been feeling more cold and painful, especially with walking. Also having some intermittent SOB, especially with laying down. Hx of TIA last May  Review of Systems  Positive: L leg pain Negative:   Physical Exam  BP 136/69 (BP Location: Right Arm)   Pulse 60   Temp (!) 97.5 F (36.4 C)   Resp 16   SpO2 99%  Gen:   Awake, no distress   Resp:  Normal effort  MSK:   Moves extremities without difficulty  Other:  DP pulse dopplerable  Medical Decision Making  Medically screening exam initiated at 11:32 AM.  Appropriate orders placed.  TASHARA SUDER was informed that the remainder of the evaluation will be completed by another provider, this initial triage assessment does not replace that evaluation, and the importance of remaining in the ED until their evaluation is complete.  [facility] performed an ABI with 0.19 in L leg and 0.59 in R leg   Ruairi Stutsman T, PA-C 08/09/22 1132

## 2022-08-09 NOTE — Telephone Encounter (Signed)
This is NOT a side effect.  If there is any swelling to this leg, she needs to go to the ED b/c this could be sign of DVT.  [name], MD

## 2022-08-09 NOTE — Telephone Encounter (Signed)
Contacted Magda with the below message from provider. Al  with the pain and severity of the findings, I would advise she go to the ED. I have not seen this patient in the past, so this is the best and fastest way for her to be seen

## 2022-08-09 NOTE — Telephone Encounter (Signed)
Daughter calling stating that pt had botox on Tuesday and last night she has experienced leg cramping from her knee to her ankle. Pt thinks this is a side effect of the botox?? I asked was she having any trouble urinating, pain with urinating or any urinary symptoms, pt stated no. Daughter wanted me to ask for advice.

## 2022-08-09 NOTE — ED Provider Notes (Signed)
[facility] Provider Note   CSN: [id number] Arrival date & time: 08/09/22  1011     History  Chief Complaint  Patient presents with   Leg Pain    Cheryl Martin is a 89 y.o. female.  Patient here after having some cramps and spasms in her left lower leg.  Symptoms now resolved.  History of CAD, she says restless legs.  Patient denies any chest pain or shortness of breath.  She has been having some intermittent pain in the left leg at times in the past.  Denies any recent surgery or travel.  No history of blood clots.  She is having pain both at rest and sometimes when she walks.  Denies any nausea vomiting or diarrhea.  She does take magnesium.  The history is provided by the patient.       Home Medications Prior to Admission medications   Medication Sig Start Date End Date Taking? Authorizing Provider  methocarbamol (ROBAXIN) 500 MG tablet Take 1 tablet (500 mg total) by mouth 2 (two) times daily as needed for up to 10 doses for muscle spasms. 08/09/22  Yes Kashana Breach, DO  methylPREDNISolone (MEDROL DOSEPAK) 4 MG TBPK tablet Follow package insert 08/09/22  Yes Reshonda Koerber, DO  acetaminophen (TYLENOL) 500 MG tablet Take 1,000 mg by mouth every 6 (six) hours as needed for moderate pain or headache.    [provider]  ASPIRIN LOW DOSE 81 MG EC tablet TAKE 1 TABLET BY MOUTH EVERY DAY 01/29/21   Lorrene Reid, PA-C  azithromycin (ZITHROMAX) 250 MG tablet As directed 08/06/22   [name], MD  Bioflavonoid Products (VITAMIN C) CHEW Chew 1 tablet by mouth daily.    [provider]  BIOTIN PO Take 1 tablet by mouth daily.    [provider]  Cholecalciferol (VITAMIN D-3 PO) Take 1,000 mcg by mouth daily with breakfast.    [provider]  Cyanocobalamin (B-12) 1000 MCG SUBL Place 2,000 mcg under the tongue daily.    [provider]  Melatonin 3 MG CAPS Take 3 mg by mouth at bedtime as  needed (sleep).    [provider]  Multiple Vitamin (MULTIVITAMIN WITH MINERALS) TABS tablet Take 1 tablet by mouth daily.    [provider]  nitroGLYCERIN (NITROSTAT) 0.4 MG SL tablet Place 1 tablet (0.4 mg total) under the tongue every 5 (five) minutes as needed for chest pain. 10/22/18 01/23/22  Dorothy Spark, MD  OVER THE COUNTER MEDICATION [place] 2 tabs daily    [provider]  polyethylene glycol (MIRALAX / GLYCOLAX) 17 g packet Take 17 g by mouth every other day.    [provider]  Sodium Chloride, Hypertonic, (MURO 128 OP) Place 1 drop into both eyes See admin instructions. Instill 1 drop into each eye in the morning, then as needed daily for dryness/irritation    [provider]      Allergies    Oxybutynin, Tape, Flexeril [cyclobenzaprine], Mold extract [trichophyton], and Pollen extract    Review of Systems   Review of Systems  Physical Exam Updated Vital Signs BP (!) 132/93   Pulse (!) 59   Temp (!) 97.5 F (36.4 C)   Resp 15   SpO2 99%  Physical Exam Vitals and nursing note reviewed.  Constitutional:      General: She is not in acute distress.    Appearance: She is well-developed. She is not ill-appearing.  HENT:  Head: Normocephalic and atraumatic.     Nose: Nose normal.     Mouth/Throat:     Mouth: Mucous membranes are moist.  Eyes:     Extraocular Movements: Extraocular movements intact.     Conjunctiva/sclera: Conjunctivae normal.     Pupils: Pupils are equal, round, and reactive to light.  Cardiovascular:     Rate and Rhythm: Normal rate and regular rhythm.     Pulses: Normal pulses.     Heart sounds: Normal heart sounds. No murmur heard. Pulmonary:     Effort: Pulmonary effort is normal. No respiratory distress.     Breath sounds: Normal breath sounds.  Abdominal:     Palpations: Abdomen is soft.     Tenderness: There is no abdominal tenderness.  Musculoskeletal:        General: No swelling.      Cervical back: Normal range of motion and neck supple.  Skin:    General: Skin is warm and dry.     Capillary Refill: Capillary refill takes less than 2 seconds.  Neurological:     General: No focal deficit present.     Mental Status: She is alert.  Psychiatric:        Mood and Affect: Mood normal.     ED Results / Procedures / Treatments   Labs (all labs ordered are listed, but only abnormal results are displayed) Labs Reviewed  CBC - Abnormal; Notable for the following components:      Result Value   HCT 35.8 (*)    All other components within normal limits  BASIC METABOLIC PANEL  MAGNESIUM    EKG None  Radiology VAS [place] LOWER EXTREMITY ARTERIAL DUPLEX  Result Date: 08/09/2022 LOWER EXTREMITY ARTERIAL DUPLEX STUDY Patient Name:  Cheryl Martin  Date of Exam:   08/09/2022 Medical Rec #: [id number]          Accession #:    [id number] Date of Birth: 01/18/1931         Patient Gender: F Patient Age:   82 years Exam Location:  [facility] Procedure:      VAS [place] LOWER EXTREMITY ARTERIAL DUPLEX Referring Phys: Marshe Shrestha --------------------------------------------------------------------------------   Current ABI: Right 0.91/0.42              Left 0.78/0.31 Comparison Study: No prior studies. Performing Technologist: Darlin Coco RDMS, RVT  Examination Guidelines: A complete evaluation includes B-mode imaging, spectral Doppler, color Doppler, and power Doppler as needed of all accessible portions of each vessel. Bilateral testing is considered an integral part of a complete examination. Limited examinations for reoccurring indications may be performed as noted.   +----------+--------+-----+--------+--------+--------+ LEFT      PSV cm/sRatioStenosisWaveformComments +----------+--------+-----+--------+--------+--------+ CFA Prox  114                  biphasic         +----------+--------+-----+--------+--------+--------+ CFA Mid   114                  biphasic          +----------+--------+-----+--------+--------+--------+ CFA Distal102                  biphasic         +----------+--------+-----+--------+--------+--------+ DFA       60                   biphasic         +----------+--------+-----+--------+--------+--------+ SFA Prox  67  biphasic         +----------+--------+-----+--------+--------+--------+ SFA Mid   55                   biphasic         +----------+--------+-----+--------+--------+--------+ SFA Distal62                   biphasic         +----------+--------+-----+--------+--------+--------+ POP Prox  39                   biphasic         +----------+--------+-----+--------+--------+--------+ POP Mid   36                   biphasic         +----------+--------+-----+--------+--------+--------+ POP Distal45                   biphasic         +----------+--------+-----+--------+--------+--------+ TP Trunk  27                   biphasic         +----------+--------+-----+--------+--------+--------+ PTA Distal31                   biphasic         +----------+--------+-----+--------+--------+--------+ DP        13                   biphasic         +----------+--------+-----+--------+--------+--------+  Summary: Left: The lower extremity arteries are widely patent with no evidence of focal stenosis. Calcification noted at the proximal to mid common femoral arteries. Biphasic waveforms throughout, possibly suggestive of more proximal obsctruction. Incidentally, no evidence of DVT.  See table(s) above for measurements and observations.    Preliminary    VAS [place] ABI WITH/WO TBI  Result Date: 08/09/2022  LOWER EXTREMITY DOPPLER STUDY Patient Name:  Cheryl Martin  Date of Exam:   08/09/2022 Medical Rec #: [id number]          Accession #:    [id number] Date of Birth: 10-25-1930         Patient Gender: F Patient Age:   75 years Exam [facility]:  [facility]  Procedure:      VAS [place] ABI WITH/WO TBI Referring Phys: Emmalea Treanor --------------------------------------------------------------------------------  Indications: Critically abnormal ABI reading on home health exam, patient with              chronic pain and history of scoliosis. Patient states onset of              intense pain last night with difficulty ambulating, left worse than              right. Left foot is cool as compared to right. High Risk Factors: Hyperlipidemia, past history of smoking, coronary artery                    disease, prior CVA.  Comparison Study: No recent prior studies. Performing Technologist: Darlin Coco RDMS RVT  Examination Guidelines: A complete evaluation includes at minimum, Doppler waveform signals and systolic blood pressure reading at the level of bilateral brachial, anterior tibial, and posterior tibial arteries, when vessel segments are accessible. Bilateral testing is considered an integral part of a complete examination. Photoelectric Plethysmograph (PPG) waveforms and toe systolic pressure readings are included as required and additional duplex testing as  needed. Limited examinations for reoccurring indications may be performed as noted.  ABI Findings: +---------+------------------+-----+---------+--------+ Right    Rt Pressure (mmHg)IndexWaveform Comment  +---------+------------------+-----+---------+--------+ Brachial 126                    triphasic         +---------+------------------+-----+---------+--------+ PTA      114               0.88 biphasic          +---------+------------------+-----+---------+--------+ DP       118               0.91 biphasic          +---------+------------------+-----+---------+--------+ Great Toe55                0.42 Abnormal          +---------+------------------+-----+---------+--------+ +---------+------------------+-----+----------+-------+ Left     Lt Pressure (mmHg)IndexWaveform  Comment  +---------+------------------+-----+----------+-------+ Brachial 130                    triphasic         +---------+------------------+-----+----------+-------+ PTA      101               0.78 biphasic          +---------+------------------+-----+----------+-------+ DP       83                0.64 monophasic        +---------+------------------+-----+----------+-------+ Great Toe40                0.31 Abnormal          +---------+------------------+-----+----------+-------+  Summary: Right: Resting right ankle-brachial index indicates mild right lower extremity arterial disease. The right toe-brachial index is abnormal. Left: Resting left ankle-brachial index indicates moderate left lower extremity arterial disease. The left toe-brachial index is abnormal. *See table(s) above for measurements and observations.     Preliminary     Procedures Procedures    Medications Ordered in ED Medications - No data to display  ED Course/ Medical Decision Making/ A&P                             Medical Decision Making Amount and/or Complexity of Data Reviewed Labs: ordered.  Risk Prescription drug management.   Cheryl Martin is here with pain in her left leg and muscle spasms.  Normal vitals.  No fever.  Triage note talks about the right leg but patient states that it has been her left leg that is been causing her some discomfort at times.  Pain last night was most severe while she was asleep.  She had a cramp in her calf and her foot with spasms.  That resolved and she is not having any discomfort now.  Patient has Doppler pulses in both lower extremities at that dorsalis pedis and posterior tibialis.  She has no redness or warmth.  There is no major swelling or asymmetry of the lower legs.  She is not having any discomfort now.  There is no signs of infection.  She has good strength and sensation.  She has a normal neurological exam.  She is very well-appearing.  Differential  diagnosis is likely a muscle spasm but will get an ultrasound to rule out DVT and will check basic labs to evaluate for any electrolyte abnormalities.  I have no concern for acute  peripheral arterial occlusion.  She does not really describe claudication symptoms but ultimately we will have her follow-up with her primary care doctor to have this further evaluated but at this time I do not think there is a need for an acute arterial vascular workup.  Possible this is also sciatic related pain and she is been having a lot of low back pain shooting down her legs as well.  She has a history of scoliosis.  If workup is unremarkable we will likely put her on a Medrol Dosepak and have her follow-up with her primary care doctor.  Lab work per my review and interpretation is unremarkable.  No significant anemia or electrolyte abnormality or kidney injury.  This chart was dictated using voice recognition software.  Despite best efforts to proofread,  errors can occur which can change the documentation meaning.  Vascular study showed no acute arterial occlusion or DVT.  Patient did have some mild right lower extremity arterial disease and patient had moderate left lower extremity arterial disease per ABI.  Overall arterial study in the left lower extremity showed no evidence of DVT, lower extremity arteries are widely patent with no evidence of focal stenosis.  Overall she does have some claudication symptoms.  Will have her follow-up with vascular surgery outpatient.  However there is no acute arterial occlusion at this time.  No DVT.  There is some element that this could be muscle spasms, sciatic nerve related pain and will prescribe muscle relaxant and steroid Dosepak.  She already has follow-up in place with her primary care doctor and neurosurgery.  Patient discharged in good condition.  Understands return precautions.  Patient already on aspirin.  This chart was dictated using voice recognition software.  Despite  best efforts to proofread,  errors can occur which can change the documentation meaning.         Final Clinical Impression(s) / ED Diagnoses Final diagnoses:  Left leg pain    Rx / DC Orders ED Discharge Orders          Ordered    methylPREDNISolone (MEDROL DOSEPAK) 4 MG TBPK tablet        08/09/22 1436    methocarbamol (ROBAXIN) 500 MG tablet  2 times daily PRN        08/09/22 1436              Lennice Sites, DO 08/09/22 1441

## 2022-08-12 NOTE — Progress Notes (Signed)
[facility] Summary Report / Loop Recorder

## 2022-08-16 ENCOUNTER — Ambulatory Visit (INDEPENDENT_AMBULATORY_CARE_PROVIDER_SITE_OTHER): Payer: Medicare HMO

## 2022-08-16 DIAGNOSIS — G459 Transient cerebral ischemic attack, unspecified: Secondary | ICD-10-CM

## 2022-08-16 LAB — CUP PACEART REMOTE DEVICE CHECK
Date Time Interrogation Session: 20240125202801
Implantable Pulse Generator Implant Date: 20231115

## 2022-08-22 DIAGNOSIS — M4316 Spondylolisthesis, lumbar region: Secondary | ICD-10-CM | POA: Diagnosis not present

## 2022-08-22 DIAGNOSIS — M5416 Radiculopathy, lumbar region: Secondary | ICD-10-CM | POA: Diagnosis not present

## 2022-08-26 ENCOUNTER — Ambulatory Visit: Payer: Medicare HMO | Admitting: Surgery

## 2022-08-26 ENCOUNTER — Encounter: Payer: Self-pay | Admitting: Surgery

## 2022-08-26 VITALS — BP 122/74 | HR 75 | Temp 97.8°F | Resp 20 | Ht 63.0 in | Wt 129.0 lb

## 2022-08-26 DIAGNOSIS — I70213 Atherosclerosis of native arteries of extremities with intermittent claudication, bilateral legs: Secondary | ICD-10-CM | POA: Diagnosis not present

## 2022-08-26 NOTE — Progress Notes (Signed)
Vascular and Vein Specialist of   Patient name: Cheryl Martin MRN: [id number] DOB: [birth year] Sex: female   REQUESTING PROVIDER:    Dr. Philipp Deputy   REASON FOR CONSULT:    Abnormal ABI  HISTORY OF PRESENT ILLNESS:   Cheryl Martin is a 89 y.o. female, who is referred for lower extremity peripheral vascular disease.  She underwent a insurance screening exam which was abnormal and she was told to go to the emergency department where she had additional testing and was then referred here.  She states that because of her scoliosis, she has back pain and will have some sciatic pain that radiates down her left leg.  She states that the toes on the bottom of her left foot are numb.  She does not endorse claudication symptoms.  Her walking is mainly limited by her back.  She does not experience cramping with activity.  She will get cramps at night which are treated with walking and taking mustard.  She also complains of some mild swelling on both legs.   The patient suffers from Alzheimer's. She has a history of a TIA with speech difficulty with unknown etiology.  She had normal carotid Doppler studies.  She had a normal echo.  She had a loop recorder placed.  She is a former smoker.  PAST MEDICAL HISTORY    Past Medical History:  Diagnosis Date   ADD (attention deficit disorder)    Allergy    Anemia    vit b12 and vit d deficiencies   Arthritis    osteoarthritis   Back spasm    Bronchitis    Complication of anesthesia    slow to wake up after colonoscopy   Coronary artery disease    DDD (degenerative disc disease), lumbar    Dysrhythmia    atrial fibrillation   Edema    Hearing loss    History of hiatal hernia    hx of   Hyperlipidemia    Hypothyroidism    hx of as child and during pregnancy   Neuromuscular disorder ([place])    pt unsure of what this is.   Nocturia    Pneumonia 02/2020   frequent bouts of bronchitis/pneumonia.  has zithromax to take at start of it.   PONV (postoperative nausea and vomiting)    Stroke ([place]) 12/2018   tia. 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, Realtor    Comment: retired  Tobacco Use   Smoking status: Former    Packs/day: 0.25    Years: 22.00    Total pack years: 5.50    Types: Cigarettes    Quit date: 07/22/1973    Years since quitting: 49.1    Passive exposure: Past   Smokeless tobacco: Never   Tobacco comments:    smoked off and on for 22 years "social smoker"  Vaping Use   Vaping Use: Never used  Substance and Sexual Activity   Alcohol use: No    Alcohol/week: 0.0 standard drinks of alcohol   Drug use: No   Sexual activity: Not Currently  Other Topics Concern   Not on file  Social History Narrative   Lives with daughter and feels safe.    Very delightful lady who is extremely independent!   Social Determinants of Health   Financial Resource Strain: Not on file  Food Insecurity: Not on file  Transportation Needs: Not on file  Physical Activity: Not on file  Stress: Not on file  Social Connections: Not on file  Intimate Partner Violence: Not on file    ALLERGIES:    Allergies  Allergen Reactions   Oxybutynin Other (See Comments)    Cannot tolerate at higher doses, caused dehydration    Tape Other (See Comments)    Skin will tear with medical tape, but can tolerate paper tape only   Flexeril [Cyclobenzaprine] Other (See Comments)    Caused excessive lethargy   Mold Extract  [Trichophyton] Other (See Comments)    Runny nose (with dust, also)   Pollen Extract Other (See Comments)    Runny nose    CURRENT MEDICATIONS:    Current Outpatient Medications  Medication Sig Dispense Refill   acetaminophen (TYLENOL) 500 MG tablet Take 1,000 mg by mouth every 6 (six) hours as needed for moderate pain or headache.     ASPIRIN LOW DOSE 81 MG EC tablet TAKE 1 TABLET BY MOUTH EVERY DAY 90 tablet 3   azithromycin (ZITHROMAX) 250 MG tablet As directed 6 each 0   Bioflavonoid Products (VITAMIN C) CHEW Chew 1 tablet by mouth daily.     BIOTIN PO Take 1 tablet by mouth daily.     Cholecalciferol (VITAMIN D-3 PO) Take 1,000 mcg by mouth daily with breakfast.     Cyanocobalamin (B-12) 1000 MCG SUBL Place 2,000 mcg under the tongue daily.     Melatonin 3 MG CAPS Take 3 mg by mouth at bedtime as needed (sleep).     methocarbamol (ROBAXIN) 500 MG tablet Take 1 tablet (500 mg total) by mouth 2 (two) times daily as needed for up to 10 doses for muscle spasms. 10 tablet 0   methylPREDNISolone (MEDROL DOSEPAK) 4 MG TBPK tablet Follow package insert 21 each 0   Multiple Vitamin (MULTIVITAMIN WITH MINERALS) TABS tablet Take 1 tablet by mouth daily.     nitroGLYCERIN (NITROSTAT) 0.4 MG SL tablet Place 1 tablet (0.4 mg total) under the tongue every 5 (five) minutes as needed for chest pain. 45 tablet 2   OVER THE COUNTER MEDICATION Curmain 2 tabs daily     polyethylene glycol (MIRALAX / GLYCOLAX) 17 g packet Take 17 g by mouth every other day.     Sodium Chloride, Hypertonic, (MURO 128 OP) Place 1 drop into both eyes See admin instructions. Instill 1 drop into each eye in the morning, then as needed daily for dryness/irritation     No current facility-administered medications for this visit.    REVIEW OF SYSTEMS:   '[X]'$  denotes positive finding, '[ ]'$  denotes negative finding Cardiac  Comments:  Chest pain or chest pressure:    Shortness of breath upon exertion:    Short of breath when  lying flat:    Irregular heart rhythm:        Vascular    Pain in calf, thigh, or hip brought on by ambulation:    Pain in feet at night that wakes you  up from your sleep:     Blood clot in your veins:    Leg swelling:  x       Pulmonary    Oxygen at home:    Productive cough:     Wheezing:         Neurologic    Sudden weakness in arms or legs:     Sudden numbness in arms or legs:     Sudden onset of difficulty speaking or slurred speech: x   Temporary loss of vision in one eye:     Problems with dizziness:         Gastrointestinal    Blood in stool:      Vomited blood:         Genitourinary    Burning when urinating:     Blood in urine:        Psychiatric    Major depression:         Hematologic    Bleeding problems:    Problems with blood clotting too easily:        Skin    Rashes or ulcers:        Constitutional    Fever or chills:     PHYSICAL EXAM:   There were no vitals filed for this visit.  GENERAL: The patient is a well-nourished female, in no acute distress. The vital signs are documented above. CARDIAC: There is a regular rate and rhythm.  VASCULAR: Palpable right dorsalis pedis pulse.  I could not palpate left pedal pulses.  She has 1+ edema bilaterally PULMONARY: Nonlabored respirations MUSCULOSKELETAL: There are no major deformities or cyanosis. NEUROLOGIC: No focal weakness or paresthesias are detected. SKIN: There are no ulcers or rashes noted. PSYCHIATRIC: The patient has a normal affect.  STUDIES:   I have reviewed the following: ABI Findings:  +---------+------------------+-----+---------+--------+  Right   Rt Pressure (mmHg)IndexWaveform Comment   +---------+------------------+-----+---------+--------+  Brachial 126                    triphasic          +---------+------------------+-----+---------+--------+  PTA     114               0.88 biphasic           +---------+------------------+-----+---------+--------+   DP      118               0.91 biphasic           +---------+------------------+-----+---------+--------+  Great Toe55                0.42 Abnormal           +---------+------------------+-----+---------+--------+   +---------+------------------+-----+----------+-------+  Left    Lt Pressure (mmHg)IndexWaveform  Comment  +---------+------------------+-----+----------+-------+  Brachial 130                    triphasic          +---------+------------------+-----+----------+-------+  PTA     101               0.78 biphasic           +---------+------------------+-----+----------+-------+  DP      83                0.64 monophasic         +---------+------------------+-----+----------+-------+  Great Toe40                0.31 Abnormal           +---------+------------------+-----+----------+-------+  Left: The lower extremity arteries are widely patent with no evidence of  focal stenosis. Calcification noted at the proximal to mid common femoral  arteries. Biphasic waveforms throughout, possibly suggestive of more  proximal obsctruction.  ASSESSMENT and PLAN   Lower extremity atherosclerotic vascular disease: On duplex, no significant stenosis was noted in the lower extremity.  She has biphasic waveforms at the ankle with abnormal toe pressures.  She does not endorse claudication like symptoms.  I do not think that her current left leg issues which are mainly numbness and sharp shooting pains down her leg are related to her vascular disease.  I told her that no intervention would be recommended.  She should continue taking an aspirin.  She should probably be on a statin but I will defer to her primary care physician for that.  I have encouraged her to pursue further treatment of her back with Dr. Ronnald Ramp.  She knows to contact me should she develop a nonhealing wound or infection.  Otherwise I will see her back in a year with repeat ABIs.   Leia Alf, MD, FACS [facility] [phone] Pager [phone]

## 2022-08-30 ENCOUNTER — Ambulatory Visit
Admission: EM | Admit: 2022-08-30 | Discharge: 2022-08-30 | Disposition: A | Discharge status: Home / Self Care | Payer: Medicare HMO | Attending: Physician Assistant | Admitting: Physician Assistant

## 2022-08-30 DIAGNOSIS — R1030 Lower abdominal pain, unspecified: Secondary | ICD-10-CM

## 2022-08-30 DIAGNOSIS — R350 Frequency of micturition: Secondary | ICD-10-CM | POA: Diagnosis not present

## 2022-08-30 DIAGNOSIS — N3090 Cystitis, unspecified without hematuria: Secondary | ICD-10-CM

## 2022-08-30 LAB — POCT URINALYSIS DIP (MANUAL ENTRY)
Bilirubin, UA: NEGATIVE
Glucose, UA: NEGATIVE mg/dL
Ketones, POC UA: NEGATIVE mg/dL
Nitrite, UA: NEGATIVE
Protein Ur, POC: NEGATIVE mg/dL
Spec Grav, UA: 1.015 (ref 1.010–1.025)
Urobilinogen, UA: 0.2 E.U./dL
pH, UA: 8 (ref 5.0–8.0)

## 2022-08-30 MED ORDER — NITROFURANTOIN MONOHYD MACRO 100 MG PO CAPS: 100.0000 mg | ORAL_CAPSULE | Freq: Two times a day (BID) | ORAL | 0 refills | Status: DC

## 2022-08-30 MED ORDER — PHENAZOPYRIDINE HCL 200 MG PO TABS: 200.0000 mg | ORAL_TABLET | Freq: Three times a day (TID) | ORAL | 0 refills | Status: DC

## 2022-08-30 NOTE — ED Triage Notes (Signed)
Patient presents to [facility] for intermittent urinary freq, abdominal pain, and odor x 3 days. Treating with OTC for med for pain.

## 2022-08-30 NOTE — Discharge Instructions (Signed)
Advised to take the Macrobid every 12 hours until completed to treat the infection. Advised take the Pyridium every 6 hours to decrease the burning, pain and discomfort. Advised to increase fluid intake to help flush the kidneys.  Advised follow-up PCP or return to urgent care if symptoms fail to improve
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
## 2022-09-02 NOTE — Progress Notes (Signed)
[facility] Summary Report / Loop Recorder 

## 2022-09-16 ENCOUNTER — Ambulatory Visit: Payer: Medicare HMO

## 2022-09-16 DIAGNOSIS — G459 Transient cerebral ischemic attack, unspecified: Secondary | ICD-10-CM | POA: Diagnosis not present

## 2022-09-17 LAB — CUP PACEART REMOTE DEVICE CHECK
Date Time Interrogation Session: 20240225232041
Implantable Pulse Generator Implant Date: 20231115

## 2022-09-19 DIAGNOSIS — M5416 Radiculopathy, lumbar region: Secondary | ICD-10-CM | POA: Diagnosis not present

## 2022-09-19 DIAGNOSIS — M4316 Spondylolisthesis, lumbar region: Secondary | ICD-10-CM | POA: Diagnosis not present

## 2022-09-23 ENCOUNTER — Telehealth: Payer: Self-pay | Admitting: *Deleted

## 2022-09-23 ENCOUNTER — Other Ambulatory Visit: Payer: Self-pay | Admitting: Neurological Surgery

## 2022-09-23 ENCOUNTER — Telehealth: Payer: Self-pay

## 2022-09-23 NOTE — Telephone Encounter (Signed)
   Pre-operative Risk Assessment    Patient Name: Cheryl Martin  DOB: [birth year] MRN: [id number]      Request for Surgical Clearance    Procedure:  L3-4 L4-5 LUMBAR LAMINECTOMY  Date of Surgery:  Clearance 10/11/22                                 Surgeon:  DR. Sherley Bounds Surgeon's Group or Practice Name:  [facility]  Phone number:  [phone] Fax number:  [phone]   Type of Clearance Requested:   - Medical  - Pharmacy:  Hold Aspirin INSTRUCTIONS WHEN TO HOLD   Type of Anesthesia:  General    Additional requests/questions:    SignedJacinta Shoe   09/23/2022, 4:24 PM

## 2022-09-23 NOTE — Telephone Encounter (Signed)
Pt has been scheduled for tele pre op appt 09/27/22 @ 2 pm. Pt requested appt for this Friday. Med rec and consent are done.

## 2022-09-23 NOTE — Telephone Encounter (Signed)
   Name: Cheryl Martin  DOB: 1931-02-01  MRN: [id number]  Primary Cardiologist: None   Preoperative team, please contact this patient and set up a phone call appointment for further preoperative risk assessment. Please obtain consent and complete medication review. Thank you for your help.  I confirm that guidance regarding antiplatelet and oral anticoagulation therapy has been completed and, if necessary, noted below.  Patient has history of TIA.  Aspirin is not prescribed by cardiology provider.  Recommendations for holding aspirin will need to come from prescribing provider.   Deberah Pelton, NP 09/23/2022, 4:41 PM [place]

## 2022-09-23 NOTE — Telephone Encounter (Signed)
Pt has been scheduled for tele pre op appt 09/27/22 @ 2 pm. Pt requested appt for this Friday. Med rec and consent are done.      Patient Consent for Virtual Visit        Cheryl Martin has provided verbal consent on 09/23/2022 for a virtual visit (video or telephone).   CONSENT FOR VIRTUAL VISIT FOR:  Cheryl Martin  By participating in this virtual visit I agree to the following:  I hereby voluntarily request, consent and authorize [facility] and its employed or contracted physicians, physician assistants, nurse practitioners or other licensed health care professionals (the Practitioner), to provide me with telemedicine health care services (the "Services") as deemed necessary by the treating Practitioner. I acknowledge and consent to receive the Services by the Practitioner via telemedicine. I understand that the telemedicine visit will involve communicating with the Practitioner through live audiovisual communication technology and the disclosure of certain medical information by electronic transmission. I acknowledge that I have been given the opportunity to request an in-person assessment or other available alternative prior to the telemedicine visit and am voluntarily participating in the telemedicine visit.  I understand that I have the right to withhold or withdraw my consent to the use of telemedicine in the course of my care at any time, without affecting my right to future care or treatment, and that the Practitioner or I may terminate the telemedicine visit at any time. I understand that I have the right to inspect all information obtained and/or recorded in the course of the telemedicine visit and may receive copies of available information for a reasonable fee.  I understand that some of the potential risks of receiving the Services via telemedicine include:  Delay or interruption in medical evaluation due to technological equipment failure or disruption; Information  transmitted may not be sufficient (e.g. poor resolution of images) to allow for appropriate medical decision making by the Practitioner; and/or  In rare instances, security protocols could fail, causing a breach of personal health information.  Furthermore, I acknowledge that it is my responsibility to provide information about my medical history, conditions and care that is complete and accurate to the best of my ability. I acknowledge that Practitioner's advice, recommendations, and/or decision may be based on factors not within their control, such as incomplete or inaccurate data provided by me or distortions of diagnostic images or specimens that may result from electronic transmissions. I understand that the practice of medicine is not an exact science and that Practitioner makes no warranties or guarantees regarding treatment outcomes. I acknowledge that a copy of this consent can be made available to me via my patient portal ([facility]), or I can request a printed copy by calling the office of [facility].    I understand that my insurance will be billed for this visit.   I have read or had this consent read to me. I understand the contents of this consent, which adequately explains the benefits and risks of the Services being provided via telemedicine.  I have been provided ample opportunity to ask questions regarding this consent and the Services and have had my questions answered to my satisfaction. I give my informed consent for the services to be provided through the use of telemedicine in my medical care
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
## 2022-09-27 ENCOUNTER — Ambulatory Visit: Payer: Medicare HMO | Attending: Internal Medicine | Admitting: Nurse Practitioner

## 2022-09-27 DIAGNOSIS — Z01810 Encounter for preprocedural cardiovascular examination: Secondary | ICD-10-CM

## 2022-09-27 DIAGNOSIS — M5416 Radiculopathy, lumbar region: Secondary | ICD-10-CM | POA: Diagnosis not present

## 2022-09-27 DIAGNOSIS — R269 Unspecified abnormalities of gait and mobility: Secondary | ICD-10-CM | POA: Diagnosis not present

## 2022-09-27 DIAGNOSIS — R6889 Other general symptoms and signs: Secondary | ICD-10-CM | POA: Diagnosis not present

## 2022-10-03 NOTE — Pre-Procedure Instructions (Signed)
Surgical Instructions    Your procedure is scheduled on Friday, March 22.  Report to [facility] Main Entrance "A" at 9:15 A.M., then check in with the Admitting office.  Call this number if you have problems the morning of surgery:  [phone]   If you have any questions prior to your surgery date call [phone]: Open Monday-Friday 8am-4pm If you experience any cold or flu symptoms such as cough, fever, chills, shortness of breath, etc. between now and your scheduled surgery, please notify us at the above number     Remember:  Do not eat or drink after midnight the night before your surgery    Take these medicines the morning of surgery with A SIP OF WATER:   IF needed: acetaminophen (TYLENOL)  traMADol (ULTRAM)  Eye drops  Follow your surgeon's instructions on when to stop Aspirin.  If no instructions were given by your surgeon then you will need to call the office to get those instructions.    As of today, STOP taking any Aleve, Naproxen, Ibuprofen, Motrin, Advil, Goody's, BC's, all herbal medications, fish oil, and all vitamins.             [facility] is not responsible for any belongings or valuables.    Do NOT Smoke (Tobacco/Vaping)  24 hours prior to your procedure  If you use a CPAP at night, you may bring your mask for your overnight stay.   Contacts, glasses, hearing aids, dentures or partials may not be worn into surgery, please bring cases for these belongings   For patients admitted to the hospital, discharge time will be determined by your treatment team.   Patients discharged the day of surgery will not be allowed to drive home, and someone needs to stay with them for 24 hours.   SURGICAL WAITING ROOM VISITATION Patients having surgery or a procedure may have no more than 2 support people in the waiting area - these visitors may rotate.   Children under the age of 78 must have an adult with them who is not the patient. If the patient needs to stay at  the hospital during part of their recovery, the visitor guidelines for inpatient rooms apply. Pre-op nurse will coordinate an appropriate time for 1 support person to accompany patient in pre-op.  This support person may not rotate.   Please refer to [email or web address] for the visitor guidelines for Inpatients (after your surgery is over and you are in a regular room).    Special instructions:    Oral Hygiene is also important to reduce your risk of infection.  Remember - BRUSH YOUR TEETH THE MORNING OF SURGERY WITH YOUR REGULAR TOOTHPASTE   Goodnight- Preparing For Surgery  Before surgery, you can play an important role. Because skin is not sterile, your skin needs to be as free of germs as possible. You can reduce the number of germs on your skin by washing with CHG (chlorahexidine gluconate) Soap before surgery.  CHG is an antiseptic cleaner which kills germs and bonds with the skin to continue killing germs even after washing.     Please do not use if you have an allergy to CHG or antibacterial soaps. If your skin becomes reddened/irritated stop using the CHG.  Do not shave (including legs and underarms) for at least 48 hours prior to first CHG shower. It is OK to shave your face.  Please follow these instructions carefully.     Shower the NIGHT BEFORE SURGERY and the [facility]  OF SURGERY with CHG Soap.   If you chose to wash your hair, wash your hair first as usual with your normal shampoo. After you shampoo, rinse your hair and body thoroughly to remove the shampoo.  Then [employer] and genitals (private parts) with your normal soap and rinse thoroughly to remove soap.  After that Use CHG Soap as you would any other liquid soap. You can apply CHG directly to the skin and wash gently with a scrungie or a clean washcloth.   Apply the CHG Soap to your body ONLY FROM THE NECK DOWN.  Do not use on open wounds or open sores. Avoid contact  with your eyes, ears, mouth and genitals (private parts). Wash Face and genitals (private parts)  with your normal soap.   Wash thoroughly, paying special attention to the area where your surgery will be performed.  Thoroughly rinse your body with warm water from the neck down.  DO NOT shower/wash with your normal soap after using and rinsing off the CHG Soap.  Pat yourself dry with a CLEAN TOWEL.  Wear CLEAN PAJAMAS to bed the night before surgery  Place CLEAN SHEETS on your bed the night before your surgery  DO NOT SLEEP WITH PETS.   Day of Surgery:  Take a shower with CHG soap. Wear Clean/Comfortable clothing the morning of surgery Do not wear jewelry or makeup. Do not wear lotions, powders, perfumes/cologne or deodorant. Do not shave 48 hours prior to surgery.  Men may shave face and neck. Do not bring valuables to the hospital. Do not wear nail polish, gel polish, artificial nails, or any other type of covering on natural nails (fingers and toes) If you have artificial nails or gel coating that need to be removed by a nail salon, please have this removed prior to surgery. Artificial nails or gel coating may interfere with anesthesia's ability to adequately monitor your vital signs. Remember to brush your teeth WITH YOUR REGULAR TOOTHPASTE.    If you received a COVID test during your pre-op visit, it is requested that you wear a mask when out in public, stay away from anyone that may not be feeling well, and notify your surgeon if you develop symptoms. If you have been in contact with anyone that has tested positive in the last 10 days, please notify your surgeon.    Please read over the following fact sheets that you were given.

## 2022-10-04 ENCOUNTER — Telehealth: Payer: Self-pay | Admitting: Internal Medicine

## 2022-10-04 ENCOUNTER — Encounter (HOSPITAL_COMMUNITY): Payer: Self-pay

## 2022-10-04 ENCOUNTER — Other Ambulatory Visit: Payer: Self-pay

## 2022-10-04 ENCOUNTER — Encounter (HOSPITAL_COMMUNITY)
Admission: RE | Admit: 2022-10-04 | Discharge: 2022-10-04 | Disposition: A | Discharge status: Home / Self Care | Payer: Medicare HMO | Source: Ambulatory Visit | Attending: Neurological Surgery | Admitting: Neurological Surgery

## 2022-10-04 VITALS — BP 126/56 | HR 62 | Temp 97.6°F | Resp 18 | Ht 63.0 in | Wt 127.4 lb

## 2022-10-04 DIAGNOSIS — E785 Hyperlipidemia, unspecified: Secondary | ICD-10-CM | POA: Insufficient documentation

## 2022-10-04 DIAGNOSIS — I2510 Atherosclerotic heart disease of native coronary artery without angina pectoris: Secondary | ICD-10-CM | POA: Insufficient documentation

## 2022-10-04 DIAGNOSIS — Z87891 Personal history of nicotine dependence: Secondary | ICD-10-CM | POA: Insufficient documentation

## 2022-10-04 DIAGNOSIS — Z01818 Encounter for other preprocedural examination: Secondary | ICD-10-CM

## 2022-10-04 DIAGNOSIS — Z01812 Encounter for preprocedural laboratory examination: Secondary | ICD-10-CM | POA: Insufficient documentation

## 2022-10-04 DIAGNOSIS — Z8673 Personal history of transient ischemic attack (TIA), and cerebral infarction without residual deficits: Secondary | ICD-10-CM | POA: Insufficient documentation

## 2022-10-04 DIAGNOSIS — D649 Anemia, unspecified: Secondary | ICD-10-CM | POA: Diagnosis not present

## 2022-10-04 DIAGNOSIS — G459 Transient cerebral ischemic attack, unspecified: Secondary | ICD-10-CM

## 2022-10-04 DIAGNOSIS — I6523 Occlusion and stenosis of bilateral carotid arteries: Secondary | ICD-10-CM | POA: Diagnosis not present

## 2022-10-04 HISTORY — DX: Family history of other specified conditions: Z84.89

## 2022-10-04 LAB — BASIC METABOLIC PANEL
Anion gap: 7 (ref 5–15)
BUN: 11 mg/dL (ref 8–23)
CO2: 26 mmol/L (ref 22–32)
Calcium: 9.5 mg/dL (ref 8.9–10.3)
Chloride: 101 mmol/L (ref 98–111)
Creatinine, Ser: 0.67 mg/dL (ref 0.44–1.00)
GFR, Estimated: >60 mL/min (ref >60)
Glucose, Bld: 86 mg/dL (ref 70–99)
Potassium: 4.5 mmol/L (ref 3.5–5.1)
Sodium: 134 mmol/L — ABNORMAL LOW (ref 135–145)

## 2022-10-04 LAB — CBC
HCT: 38.9 % (ref 36.0–46.0)
Hemoglobin: 12.9 g/dL (ref 12.0–15.0)
MCH: 31.5 pg (ref 26.0–34.0)
MCHC: 33.2 g/dL (ref 30.0–36.0)
MCV: 95.1 fL (ref 80.0–100.0)
Platelets: 219 10*3/uL (ref 150–400)
RBC: 4.09 MIL/uL (ref 3.87–5.11)
RDW: 12.7 % (ref 11.5–15.5)
WBC: 4.7 10*3/uL (ref 4.0–10.5)
nRBC: 0 % (ref 0.0–0.2)

## 2022-10-04 LAB — SURGICAL PCR SCREEN
MRSA, PCR: NEGATIVE
Staphylococcus aureus: NEGATIVE

## 2022-10-04 LAB — PROTIME-INR
INR: 1.1 (ref 0.8–1.2)
Prothrombin Time: 13.7 seconds (ref 11.4–15.2)

## 2022-10-04 NOTE — Progress Notes (Signed)
PCP - Virgil Benedict PA Cardiologist - Will Camnitz EP, Dr. Ali Lowe   PPM/ICD - Denies  Chest x-ray - n/i EKG - 11/28/21 Stress Test - 10/26/18 ECHO - 11/27/21 Cardiac Cath -"When I was about 52"    Sleep Study - No OSA  DM - Denies  Blood Thinner Instructions:N/A Aspirin Instructions:Per patient has stopped with last dose 10/03/22  COVID TEST- N/A  Anesthesia review: Yes cardiac hx  Patient denies any respiratory illness, PNA, cold, flu or covid in the past two months  Patient denies shortness of breath, fever, cough and chest pain at PAT appointment   All instructions explained to the patient, with a verbal understanding of the material. Patient agrees to go over the instructions while at home for a better understanding.  The opportunity to ask questions was provided.

## 2022-10-04 NOTE — Telephone Encounter (Signed)
Unfortunately this was televisit so needs ov asap to clear her  Ok to add on to schedule for  11 45 am Monday 10/07/22

## 2022-10-04 NOTE — Telephone Encounter (Signed)
Fax received from Dr. Sherley Bounds with [facility] to perform a L3-4,L4-5 Lumbar Laminectomy on patient.  Patient needs surgery clearance. Surgery is 10/11/2022. Patient was seen on 07/30/2022. Office protocol is a risk assessment can be sent to surgeon if patient has been seen in 60 days or less.   Sending to Dr. Melvyn Novas for risk assessment or recommendations if patient needs to be seen in office prior to surgical procedure.    Dr. Melvyn Novas patient had a video visit on 07/30/2022. Please advise if patient needs to come in for a appointment

## 2022-10-07 NOTE — Telephone Encounter (Signed)
Called and got patient scheduled for 10/08/22 with Dr Melvyn Novas at [facility]
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
## 2022-10-08 ENCOUNTER — Encounter: Payer: Self-pay | Admitting: Internal Medicine

## 2022-10-08 ENCOUNTER — Ambulatory Visit (INDEPENDENT_AMBULATORY_CARE_PROVIDER_SITE_OTHER): Payer: Medicare HMO | Admitting: Internal Medicine

## 2022-10-08 ENCOUNTER — Ambulatory Visit (INDEPENDENT_AMBULATORY_CARE_PROVIDER_SITE_OTHER): Payer: Medicare HMO

## 2022-10-08 VITALS — BP 130/68 | HR 64 | Temp 98.2°F | Ht 63.0 in | Wt 127.6 lb

## 2022-10-08 DIAGNOSIS — J439 Emphysema, unspecified: Secondary | ICD-10-CM | POA: Diagnosis not present

## 2022-10-08 DIAGNOSIS — J479 Bronchiectasis, uncomplicated: Secondary | ICD-10-CM | POA: Diagnosis not present

## 2022-10-08 NOTE — Progress Notes (Unsigned)
Cheryl Martin, female    DOB: 01-06-1931,    MRN: [id number]   Brief patient profile:  89 yowf MM/quit smoking 1975 moved from [place] 1984 with recurrent episodes of  bronchitis variable times every since arrival in [place] with some seasonal rhinitis esp in Spring and abnormal cxr since around 2017 with new dx of bronchiectasis by CT chest  01/12/2019 so referred to pulmonary clinic 03/02/2019 by Dr   Raliegh Scarlet     History of Present Illness  03/02/2019  Pulmonary/ 1st office eval/Cheryl Martin  Chief Complaint  Patient presents with   Pulmonary Consult    Referred by Dr Raliegh Scarlet for bronchiectasis.    Dyspnea:  Can walk 25 min flat around a track each lap takes a min avg pace -faster most people her age Cough: just with flares / really not significant production chronically, just during flares of what she's been calling bronchitis which is only once a year at most.  Sleep: nasal congestion but doesn't keep her up, one pillow  SABA use: none New abd pain x 4 months no relation with meals  rec GERD diet  Bronchiectasis =    Whenever you develop cough congestion take mucinex or mucinex dm up to 1200 mg every 12 hours as needed        04/15/2019  f/u ov/Cheryl Martin re: bronchiectasis  Chief Complaint  Patient presents with   Follow-up    Breathing is doing well. She has occ throat clearing.   Dyspnea:  Walking track > no change in ex tol  Cough: none x with flares , last jan/feb 2020  Sleeping: some nasal congestion / doesn't really keep her up  SABA use: none  02: none  rec In the event of a flare of cough with excess or nasty mucus > refillable zpak  pfts on return >nl 09/21/19   10/08/2022  f/u ov/Cheryl Martin re: bronchiectasis with nl pfts    maint on prn zpak  Chief Complaint  Patient presents with   Follow-up    Surgical clearance.  10/11/2022 with [facility] to perform a L3-4,L4-5 Lumbar Laminectomy   Dyspnea:  limited by pain radicular L lateral leg  Cough: none / watery  rhinitis x years responds to homeopathic pills  Sleeping: flat bed on side  SABA use: none  02: none     No obvious day to day or daytime variability or assoc excess/ purulent sputum or mucus plugs or hemoptysis or cp or chest tightness, subjective wheeze or overt sinus or hb symptoms.   Sleeping  without nocturnal  or early am exacerbation  of respiratory  c/o's or need for noct saba. Also denies any obvious fluctuation of symptoms with weather or environmental changes or other aggravating or alleviating factors except as outlined above   No unusual exposure hx or h/o childhood pna/ asthma or knowledge of premature birth.  Current Allergies, Complete Past Medical History, Past Surgical History, Family History, and Social History were reviewed in [employer] record.  ROS  The following are not active complaints unless bolded Hoarseness, sore throat, dysphagia, dental problems, itching, sneezing,  nasal congestion or discharge of excess mucus or purulent secretions, ear ache,   fever, chills, sweats, unintended wt loss or wt gain, classically pleuritic or exertional cp,  orthopnea pnd or arm/hand swelling  or leg swelling, presyncope, palpitations, abdominal pain, anorexia, nausea, vomiting, diarrhea  or change in bowel habits or change in bladder habits, change in stools or change in  urine, dysuria, hematuria,  rash, arthralgias, visual complaints, headache, numbness, weakness or ataxia or problems with walking or coordination,  change in mood or  memory.        Current Meds  Medication Sig   acetaminophen (TYLENOL) 500 MG tablet Take 1,000 mg by mouth every 6 (six) hours as needed for moderate pain or headache.   ASPIRIN LOW DOSE 81 MG EC tablet TAKE 1 TABLET BY MOUTH EVERY DAY   azithromycin (ZITHROMAX) 250 MG tablet As directed   Bioflavonoid Products (VITAMIN C) CHEW Chew 1 tablet by mouth daily.   BIOTIN PO Take 1 tablet by mouth daily.   Cholecalciferol (VITAMIN  D-3) 25 MCG (1000 UT) CAPS Take 1,000 Units by mouth daily with breakfast.   Cyanocobalamin (B-12) 1000 MCG SUBL Place 2,000 mcg under the tongue daily.   Melatonin 3 MG CAPS Take 3 mg by mouth at bedtime as needed (sleep).   Multiple Vitamin (MULTIVITAMIN WITH MINERALS) TABS tablet Take 1 tablet by mouth daily.   polyethylene glycol (MIRALAX / GLYCOLAX) 17 g packet Take 17 g by mouth daily as needed for moderate constipation.   Sodium Chloride, Hypertonic, (MURO 128 OP) Place 1 drop into both eyes 2 (two) times daily as needed (dry eyes).   traMADol (ULTRAM) 50 MG tablet Take 50 mg by mouth every 6 (six) hours as needed for moderate pain.                            Past Medical History:  Diagnosis Date   ADD (attention deficit disorder)    Allergy    Anemia    as a teenager   Arthritis    Complication of anesthesia    slow to wake up after colonoscopy   Edema    Hearing loss    History of hiatal hernia    hx of   Hypothyroidism    hx of as child and during pregnancy   Neuromuscular disorder (HCC)    Nocturia    Pneumonia    hx of after bronchitis   PONV (postoperative nausea and vomiting)    Thyroid disease   Hands feet swelling     Objective:    wts   10/08/2022       127  12/11/2020       136  06/13/2020     136  09/21/2019         140  04/15/19 138 lb (62.6 kg)  04/08/19 137 lb (62.1 kg)  03/15/19 136 lb (61.7 kg)    Vital signs reviewed  10/08/2022  - Note at rest 02 sats  97% on RA   General appearance:    pleasant amb wf nad    HEENT : Oropharynx  clear     Nasal turbinates nl    NECK :  without  apparent JVD/ palpable Nodes/TM    LUNGS: no acc muscle use,  Nl contour chest which is clear to A and P bilaterally without cough on insp or exp maneuvers   CV:  RRR  no s3 or murmur or increase in P2, and no edema   ABD:  soft and nontender with nl inspiratory excursion in the supine position. No bruits or organomegaly appreciated   MS:  Nl gait/ ext  warm without deformities Or obvious joint restrictions  calf tenderness, cyanosis or clubbing    SKIN: warm and dry without lesions    NEURO:  alert, approp,  nl sensorium with  no motor or cerebellar deficits apparent.     CXR PA and Lateral:   10/08/2022 :    I personally reviewed images and impression is as follows:      Mild scoliotic changes/ no acute findings    Assessment

## 2022-10-08 NOTE — Patient Instructions (Signed)
Please remember to go to the  x-ray department  for your tests - we will call you with the results when they are available     You are cleared for surgery

## 2022-10-09 ENCOUNTER — Encounter: Payer: Self-pay | Admitting: Family Medicine

## 2022-10-09 ENCOUNTER — Encounter: Payer: Self-pay | Admitting: Internal Medicine

## 2022-10-09 ENCOUNTER — Ambulatory Visit (INDEPENDENT_AMBULATORY_CARE_PROVIDER_SITE_OTHER): Payer: Medicare HMO | Admitting: Family Medicine

## 2022-10-09 ENCOUNTER — Encounter (HOSPITAL_COMMUNITY): Payer: Self-pay

## 2022-10-09 VITALS — BP 120/68 | HR 65 | Ht 63.0 in | Wt 125.0 lb

## 2022-10-09 DIAGNOSIS — E785 Hyperlipidemia, unspecified: Secondary | ICD-10-CM

## 2022-10-09 DIAGNOSIS — J479 Bronchiectasis, uncomplicated: Secondary | ICD-10-CM | POA: Diagnosis not present

## 2022-10-09 DIAGNOSIS — Z8673 Personal history of transient ischemic attack (TIA), and cerebral infarction without residual deficits: Secondary | ICD-10-CM

## 2022-10-09 NOTE — Assessment & Plan Note (Signed)
Followed by cardiology 

## 2022-10-09 NOTE — Assessment & Plan Note (Signed)
Onset   ? around 2017  - see CT chest 01/12/2019 - Labs ordered 03/02/2019     alpha one AT phenotype  MM level 147 - Allergy profile 03/02/19 >  Eos 0.1  /  IgE  13 RAST neg  - Ig profile: 03/02/2019   Minimal decrease IgM and IgG - 04/15/2019 added prn cycles of zpak for flares  - PFT's  09/21/2019  FEV1 2.22 (128 % ) ratio 0.75  p 4 % improvement from saba p nothing prior to study    Bronchiectasis s airflow obst > main concern with GA/ back surgery would be retained secretions/HCAP  but she really has done well on minimal rx to date so no contraindication to back surgery.  Advised on need to mobilize early and minimize opioids post op.  Pulmonary f/u can be prn   Cleared for surgery   F/u yearly, sooner prn          Each maintenance medication was reviewed in detail including emphasizing most importantly the difference between maintenance and prns and under what circumstances the prns are to be triggered using an action plan format where appropriate.  Total time for H and P, chart review, counseling,  and generating customized AVS unique to this office visit / same day charting = 21 min

## 2022-10-09 NOTE — Assessment & Plan Note (Signed)
Followed by pulmonology 

## 2022-10-09 NOTE — Anesthesia Preprocedure Evaluation (Addendum)
Anesthesia Evaluation  Patient identified by MRN, date of birth, ID band Patient awake    Reviewed: Allergy & Precautions, NPO status , Patient's Chart, lab work & pertinent test results  History of Anesthesia Complications (+) PONV and history of anesthetic complications  Airway Mallampati: II  TM Distance: >3 FB     Dental  (+) Partial Upper, Dental Advisory Given,    Pulmonary former smoker   breath sounds clear to auscultation       Cardiovascular (-) hypertension(-) angina + CAD  (-) Past MI  Rhythm:Regular   Nuclear stress EF: 63%.  There was no ST segment deviation noted during stress.  The study is normal.  This is a low risk study.  The left ventricular ejection fraction is normal (55-65%).   Normal pharmacologic nuclear stress test with no evidence for a prior infarct or ischemia.    Neuro/Psych TIA Neuromuscular disease  negative psych ROS   GI/Hepatic Neg liver ROS, hiatal hernia,,,  Endo/Other  Hypothyroidism    Renal/GU negative Renal ROS     Musculoskeletal  (+) Arthritis ,    Abdominal   Peds  Hematology Lab Results      Component                Value               Date                      WBC                      4.7                 10/04/2022                HGB                      12.9                10/04/2022                HCT                      38.9                10/04/2022                MCV                      95.1                10/04/2022                PLT                      219                 10/04/2022              Anesthesia Other Findings   Reproductive/Obstetrics                             Anesthesia Physical Anesthesia Plan  ASA: 2  Anesthesia Plan: General   Post-op Pain Management: Gabapentin PO (pre-op)* and Tylenol PO (pre-op)*   Induction: Intravenous  PONV Risk Score and Plan: 4 or greater and Ondansetron and  Dexamethasone  Airway Management Planned: Oral ETT  Additional Equipment: None  Intra-op Plan:   Post-operative Plan: Extubation in OR  Informed Consent: I have reviewed the patients History and Physical, chart, labs and discussed the procedure including the risks, benefits and alternatives for the proposed anesthesia with the patient or authorized representative who has indicated his/her understanding and acceptance.     Dental advisory given  Plan Discussed with: CRNA  Anesthesia Plan Comments: (PAT note written 10/09/2022 by Myra Gianotti, PA-C.  )       Anesthesia Quick Evaluation

## 2022-10-09 NOTE — Telephone Encounter (Signed)
OV notes and clearance form have been faxed back to [facility]. Nothing further needed at this time.  

## 2022-10-09 NOTE — Progress Notes (Signed)
   Established Patient Office Visit  Subjective   Patient ID: Cheryl Martin, female    DOB: 11-03-1930  Age: 89 y.o. MRN: [id number]  No chief complaint on file.   HPI Cheryl Martin is a 89 y.o. female presenting today for follow up of hyperlipidemia.  She has back surgery coming up on Friday and has had labs drawn within the past couple days.  They did not include a lipid panel. Hyperlipidemia: Managing with diet and exercise after not tolerating Crestor. Currently consuming a low fat diet. Home exercise routine includes walking. The ASCVD Risk score (Arnett DK, et al., 2019) failed to calculate for the following reasons:   The 2019 ASCVD risk score is only valid for ages 31 to 64   The patient has a prior MI or stroke diagnosis  ROS Negative unless otherwise noted in HPI   Objective:     BP 120/68   Pulse 65   Ht 5\' 3"  (1.6 m)   Wt 125 lb (56.7 kg)   SpO2 97%   BMI 22.14 kg/m   Physical Exam Constitutional:      General: She is not in acute distress.    Appearance: Normal appearance.  HENT:     Head: Normocephalic and atraumatic.  Cardiovascular:     Rate and Rhythm: Normal rate and regular rhythm.     Pulses: Normal pulses.     Heart sounds: No murmur heard.    No friction rub. No gallop.  Pulmonary:     Effort: Pulmonary effort is normal. No respiratory distress.     Breath sounds: No wheezing, rhonchi or rales.  Skin:    General: Skin is warm and dry.  Neurological:     Mental Status: She is alert and oriented to person, place, and time.     Assessment & Plan:  Hyperlipidemia, unspecified hyperlipidemia type Assessment & Plan: Previously discontinued Crestor therapy due to side effects and after discussing with provider risk versus benefits given age.  Patient has been following heart healthy diet very low in meat and walks to stay active.  With her surgery coming up, put in orders for CMP and cholesterol panel which she will have drawn in the next 3  months or so at her convenience after she has recovered.  Patient verbalizes understanding and is agreeable with this plan.  Orders: -     Comprehensive metabolic panel; Future -     Lipid panel; Future  History of transient ischemic attack (TIA) Assessment & Plan: Followed by cardiology.   Bronchiectasis without complication [facility]) Assessment & Plan: Followed by pulmonology.     Return in about 6 months (around 04/11/2023) for Medicare annual wellness visit, fasting blood work 1 week before.    Velva Harman, PA

## 2022-10-09 NOTE — Patient Instructions (Signed)
I have sent in the orders for your labs, so once you are at a point where you feel good and have recovered some from your surgery you can come in fasting to have them done.  I hope your surgery goes well on Friday!

## 2022-10-09 NOTE — Assessment & Plan Note (Signed)
Previously discontinued Crestor therapy due to side effects and after discussing with provider risk versus benefits given age.  Patient has been following heart healthy diet very low in meat and walks to stay active.  With her surgery coming up, put in orders for CMP and cholesterol panel which she will have drawn in the next 3 months or so at her convenience after she has recovered.  Patient verbalizes understanding and is agreeable with this plan.

## 2022-10-10 NOTE — Progress Notes (Signed)
Left a message to pt's voicemail to arrive at [place] [facility]. I tried calling her daughter but not answering.

## 2022-10-11 ENCOUNTER — Ambulatory Visit (HOSPITAL_BASED_OUTPATIENT_CLINIC_OR_DEPARTMENT_OTHER): Payer: Medicare HMO | Admitting: Certified Registered"

## 2022-10-11 ENCOUNTER — Ambulatory Visit (HOSPITAL_COMMUNITY): Payer: Medicare HMO

## 2022-10-11 ENCOUNTER — Encounter (HOSPITAL_COMMUNITY)
Admission: RE | Disposition: A | Discharge status: Home / Self Care | Payer: Self-pay | Source: Home / Self Care | Attending: Neurological Surgery

## 2022-10-11 ENCOUNTER — Observation Stay (HOSPITAL_COMMUNITY)
Admission: RE | Admit: 2022-10-11 | Discharge: 2022-10-12 | Disposition: A | Discharge status: Home / Self Care | Payer: Medicare HMO | Attending: Neurological Surgery | Admitting: Neurological Surgery

## 2022-10-11 ENCOUNTER — Other Ambulatory Visit: Payer: Self-pay

## 2022-10-11 ENCOUNTER — Encounter (HOSPITAL_COMMUNITY): Payer: Self-pay | Admitting: Neurological Surgery

## 2022-10-11 ENCOUNTER — Ambulatory Visit (HOSPITAL_COMMUNITY): Payer: Medicare HMO | Admitting: Vascular Surgery

## 2022-10-11 DIAGNOSIS — I2510 Atherosclerotic heart disease of native coronary artery without angina pectoris: Secondary | ICD-10-CM | POA: Insufficient documentation

## 2022-10-11 DIAGNOSIS — E039 Hypothyroidism, unspecified: Secondary | ICD-10-CM | POA: Diagnosis not present

## 2022-10-11 DIAGNOSIS — M48061 Spinal stenosis, lumbar region without neurogenic claudication: Secondary | ICD-10-CM | POA: Insufficient documentation

## 2022-10-11 DIAGNOSIS — Z8673 Personal history of transient ischemic attack (TIA), and cerebral infarction without residual deficits: Secondary | ICD-10-CM | POA: Diagnosis not present

## 2022-10-11 DIAGNOSIS — Z79899 Other long term (current) drug therapy: Secondary | ICD-10-CM | POA: Insufficient documentation

## 2022-10-11 DIAGNOSIS — Z7982 Long term (current) use of aspirin: Secondary | ICD-10-CM | POA: Diagnosis not present

## 2022-10-11 DIAGNOSIS — Z87891 Personal history of nicotine dependence: Secondary | ICD-10-CM | POA: Diagnosis not present

## 2022-10-11 DIAGNOSIS — M5416 Radiculopathy, lumbar region: Secondary | ICD-10-CM | POA: Diagnosis not present

## 2022-10-11 DIAGNOSIS — Z96652 Presence of left artificial knee joint: Secondary | ICD-10-CM | POA: Insufficient documentation

## 2022-10-11 DIAGNOSIS — Z981 Arthrodesis status: Secondary | ICD-10-CM | POA: Diagnosis not present

## 2022-10-11 DIAGNOSIS — Z9889 Other specified postprocedural states: Secondary | ICD-10-CM

## 2022-10-11 DIAGNOSIS — G459 Transient cerebral ischemic attack, unspecified: Secondary | ICD-10-CM

## 2022-10-11 HISTORY — PX: LUMBAR LAMINECTOMY/DECOMPRESSION MICRODISCECTOMY: SHX5026

## 2022-10-11 SURGERY — LUMBAR LAMINECTOMY/DECOMPRESSION MICRODISCECTOMY 2 LEVELS
Anesthesia: General | Site: Back | Laterality: Left

## 2022-10-11 MED ORDER — DEXAMETHASONE SODIUM PHOSPHATE 10 MG/ML IJ SOLN
INTRAMUSCULAR | Status: DC | PRN
  Administered 2022-10-11: 5 mg via INTRAVENOUS

## 2022-10-11 MED ORDER — POTASSIUM CHLORIDE IN NACL 20-0.9 MEQ/L-% IV SOLN: INTRAVENOUS | Status: DC

## 2022-10-11 MED ORDER — CHLORHEXIDINE GLUCONATE CLOTH 2 % EX PADS: 6.0000 | MEDICATED_PAD | Freq: Once | CUTANEOUS | Status: DC

## 2022-10-11 MED ORDER — DEXAMETHASONE 4 MG PO TABS
4.0000 mg | ORAL_TABLET | Freq: Four times a day (QID) | ORAL | Status: DC
  Administered 2022-10-11 – 2022-10-12 (×3): 4 mg via ORAL
  Filled 2022-10-11 (×3): qty 1

## 2022-10-11 MED ORDER — THROMBIN 5000 UNITS EX SOLR: OROMUCOSAL | Status: DC | PRN

## 2022-10-11 MED ORDER — MENTHOL 3 MG MT LOZG: 1.0000 | LOZENGE | OROMUCOSAL | Status: DC | PRN

## 2022-10-11 MED ORDER — LACTATED RINGERS IV SOLN: INTRAVENOUS | Status: DC

## 2022-10-11 MED ORDER — GABAPENTIN 300 MG PO CAPS
ORAL_CAPSULE | ORAL | Status: AC
  Administered 2022-10-11: 300 mg via ORAL
  Filled 2022-10-11: qty 1

## 2022-10-11 MED ORDER — METHOCARBAMOL 500 MG PO TABS: 500.0000 mg | ORAL_TABLET | Freq: Four times a day (QID) | ORAL | Status: DC | PRN

## 2022-10-11 MED ORDER — CEFAZOLIN SODIUM-DEXTROSE 2-4 GM/100ML-% IV SOLN
2.0000 g | Freq: Three times a day (TID) | INTRAVENOUS | Status: AC
  Administered 2022-10-11 – 2022-10-12 (×2): 2 g via INTRAVENOUS
  Filled 2022-10-11 (×2): qty 100

## 2022-10-11 MED ORDER — ACETAMINOPHEN 160 MG/5ML PO SOLN: 1000.0000 mg | Freq: Once | ORAL | Status: DC | PRN

## 2022-10-11 MED ORDER — GABAPENTIN 300 MG PO CAPS: 300.0000 mg | ORAL_CAPSULE | ORAL | Status: AC

## 2022-10-11 MED ORDER — PROPOFOL 10 MG/ML IV BOLUS
INTRAVENOUS | Status: DC | PRN
  Administered 2022-10-11: 90 mg via INTRAVENOUS
  Administered 2022-10-11: 20 mg via INTRAVENOUS

## 2022-10-11 MED ORDER — EPHEDRINE SULFATE-NACL 50-0.9 MG/10ML-% IV SOSY
PREFILLED_SYRINGE | INTRAVENOUS | Status: DC | PRN
  Administered 2022-10-11: 5 mg via INTRAVENOUS

## 2022-10-11 MED ORDER — ACETAMINOPHEN 500 MG PO TABS
ORAL_TABLET | ORAL | Status: AC
  Administered 2022-10-11: 1000 mg via ORAL
  Filled 2022-10-11: qty 2

## 2022-10-11 MED ORDER — FENTANYL CITRATE (PF) 250 MCG/5ML IJ SOLN
INTRAMUSCULAR | Status: AC
  Filled 2022-10-11: qty 5

## 2022-10-11 MED ORDER — CEFAZOLIN SODIUM-DEXTROSE 2-4 GM/100ML-% IV SOLN
INTRAVENOUS | Status: AC
  Filled 2022-10-11: qty 100

## 2022-10-11 MED ORDER — 0.9 % SODIUM CHLORIDE (POUR BTL) OPTIME
TOPICAL | Status: DC | PRN
  Administered 2022-10-11: 1000 mL

## 2022-10-11 MED ORDER — ACETAMINOPHEN 650 MG RE SUPP: 650.0000 mg | RECTAL | Status: DC | PRN

## 2022-10-11 MED ORDER — ACETAMINOPHEN 500 MG PO TABS: 1000.0000 mg | ORAL_TABLET | Freq: Once | ORAL | Status: DC | PRN

## 2022-10-11 MED ORDER — PHENYLEPHRINE 80 MCG/ML (10ML) SYRINGE FOR IV PUSH (FOR BLOOD PRESSURE SUPPORT)
PREFILLED_SYRINGE | INTRAVENOUS | Status: DC | PRN
  Administered 2022-10-11: 80 ug via INTRAVENOUS

## 2022-10-11 MED ORDER — BUPIVACAINE HCL (PF) 0.25 % IJ SOLN
INTRAMUSCULAR | Status: AC
  Filled 2022-10-11: qty 30

## 2022-10-11 MED ORDER — ACETAMINOPHEN 10 MG/ML IV SOLN: 1000.0000 mg | Freq: Once | INTRAVENOUS | Status: DC | PRN

## 2022-10-11 MED ORDER — OXYCODONE HCL 5 MG/5ML PO SOLN: 5.0000 mg | Freq: Once | ORAL | Status: DC | PRN

## 2022-10-11 MED ORDER — SODIUM CHLORIDE 0.9 % IV SOLN
250.0000 mL | INTRAVENOUS | Status: DC
  Administered 2022-10-11: 250 mL via INTRAVENOUS

## 2022-10-11 MED ORDER — CHLORHEXIDINE GLUCONATE 0.12 % MT SOLN: 15.0000 mL | Freq: Once | OROMUCOSAL | Status: DC

## 2022-10-11 MED ORDER — CHLORHEXIDINE GLUCONATE 0.12 % MT SOLN
OROMUCOSAL | Status: AC
  Administered 2022-10-11: 15 mL via OROMUCOSAL
  Filled 2022-10-11: qty 15

## 2022-10-11 MED ORDER — METHOCARBAMOL 1000 MG/10ML IJ SOLN: 500.0000 mg | Freq: Four times a day (QID) | INTRAVENOUS | Status: DC | PRN

## 2022-10-11 MED ORDER — ONDANSETRON HCL 4 MG PO TABS: 4.0000 mg | ORAL_TABLET | Freq: Four times a day (QID) | ORAL | Status: DC | PRN

## 2022-10-11 MED ORDER — ACETAMINOPHEN 500 MG PO TABS
1000.0000 mg | ORAL_TABLET | Freq: Four times a day (QID) | ORAL | Status: AC
  Administered 2022-10-11 – 2022-10-12 (×4): 1000 mg via ORAL
  Filled 2022-10-11 (×4): qty 2

## 2022-10-11 MED ORDER — SODIUM CHLORIDE 0.9% FLUSH
3.0000 mL | Freq: Two times a day (BID) | INTRAVENOUS | Status: DC
  Administered 2022-10-11: 3 mL via INTRAVENOUS

## 2022-10-11 MED ORDER — ROCURONIUM BROMIDE 100 MG/10ML IV SOLN
INTRAVENOUS | Status: DC | PRN
  Administered 2022-10-11 (×2): 50 mg via INTRAVENOUS

## 2022-10-11 MED ORDER — PROPOFOL 10 MG/ML IV BOLUS
INTRAVENOUS | Status: AC
  Filled 2022-10-11: qty 20

## 2022-10-11 MED ORDER — FENTANYL CITRATE (PF) 100 MCG/2ML IJ SOLN
INTRAMUSCULAR | Status: AC
  Filled 2022-10-11: qty 2

## 2022-10-11 MED ORDER — LIDOCAINE HCL (CARDIAC) PF 100 MG/5ML IV SOSY
PREFILLED_SYRINGE | INTRAVENOUS | Status: DC | PRN
  Administered 2022-10-11: 40 mg via INTRATRACHEAL

## 2022-10-11 MED ORDER — TRAMADOL HCL 50 MG PO TABS
50.0000 mg | ORAL_TABLET | Freq: Four times a day (QID) | ORAL | Status: DC | PRN
  Administered 2022-10-11 – 2022-10-12 (×2): 50 mg via ORAL
  Filled 2022-10-11 (×3): qty 1

## 2022-10-11 MED ORDER — ONDANSETRON HCL 4 MG/2ML IJ SOLN: 4.0000 mg | Freq: Four times a day (QID) | INTRAMUSCULAR | Status: DC | PRN

## 2022-10-11 MED ORDER — CELECOXIB 200 MG PO CAPS
200.0000 mg | ORAL_CAPSULE | Freq: Two times a day (BID) | ORAL | Status: DC
  Administered 2022-10-11 – 2022-10-12 (×3): 200 mg via ORAL
  Filled 2022-10-11 (×3): qty 1

## 2022-10-11 MED ORDER — ACETAMINOPHEN 500 MG PO TABS: 1000.0000 mg | ORAL_TABLET | ORAL | Status: AC

## 2022-10-11 MED ORDER — MORPHINE SULFATE (PF) 2 MG/ML IV SOLN: 2.0000 mg | INTRAVENOUS | Status: DC | PRN

## 2022-10-11 MED ORDER — CEFAZOLIN SODIUM-DEXTROSE 2-4 GM/100ML-% IV SOLN
2.0000 g | INTRAVENOUS | Status: AC
  Administered 2022-10-11: 2 g via INTRAVENOUS

## 2022-10-11 MED ORDER — FENTANYL CITRATE (PF) 100 MCG/2ML IJ SOLN
25.0000 ug | INTRAMUSCULAR | Status: DC | PRN
  Administered 2022-10-11: 25 ug via INTRAVENOUS

## 2022-10-11 MED ORDER — THROMBIN 5000 UNITS EX SOLR
CUTANEOUS | Status: AC
  Filled 2022-10-11: qty 5000

## 2022-10-11 MED ORDER — FENTANYL CITRATE (PF) 250 MCG/5ML IJ SOLN
INTRAMUSCULAR | Status: DC | PRN
  Administered 2022-10-11: 100 ug via INTRAVENOUS
  Administered 2022-10-11 (×2): 25 ug via INTRAVENOUS

## 2022-10-11 MED ORDER — PHENOL 1.4 % MT LIQD: 1.0000 | OROMUCOSAL | Status: DC | PRN

## 2022-10-11 MED ORDER — PHENYLEPHRINE HCL-NACL 20-0.9 MG/250ML-% IV SOLN
INTRAVENOUS | Status: DC | PRN
  Administered 2022-10-11: 30 ug/min via INTRAVENOUS

## 2022-10-11 MED ORDER — DEXAMETHASONE SODIUM PHOSPHATE 4 MG/ML IJ SOLN
4.0000 mg | Freq: Four times a day (QID) | INTRAMUSCULAR | Status: DC
  Administered 2022-10-11: 4 mg via INTRAVENOUS
  Filled 2022-10-11: qty 1

## 2022-10-11 MED ORDER — ACETAMINOPHEN 325 MG PO TABS
650.0000 mg | ORAL_TABLET | ORAL | Status: DC | PRN
  Filled 2022-10-11: qty 2

## 2022-10-11 MED ORDER — ALUM & MAG HYDROXIDE-SIMETH 200-200-20 MG/5ML PO SUSP
30.0000 mL | Freq: Four times a day (QID) | ORAL | Status: DC | PRN
  Administered 2022-10-11: 30 mL via ORAL
  Filled 2022-10-11: qty 30

## 2022-10-11 MED ORDER — OXYCODONE HCL 5 MG PO TABS: 5.0000 mg | ORAL_TABLET | Freq: Once | ORAL | Status: DC | PRN

## 2022-10-11 MED ORDER — SUGAMMADEX SODIUM 200 MG/2ML IV SOLN
INTRAVENOUS | Status: DC | PRN
  Administered 2022-10-11: 400 mg via INTRAVENOUS

## 2022-10-11 MED ORDER — BUPIVACAINE HCL (PF) 0.25 % IJ SOLN
INTRAMUSCULAR | Status: DC | PRN
  Administered 2022-10-11: 3 mL

## 2022-10-11 MED ORDER — ORAL CARE MOUTH RINSE: 15.0000 mL | Freq: Once | OROMUCOSAL | Status: DC

## 2022-10-11 MED ORDER — ORAL CARE MOUTH RINSE: 15.0000 mL | Freq: Once | OROMUCOSAL | Status: AC

## 2022-10-11 MED ORDER — SENNA 8.6 MG PO TABS
1.0000 | ORAL_TABLET | Freq: Two times a day (BID) | ORAL | Status: DC
  Administered 2022-10-11 – 2022-10-12 (×2): 8.6 mg via ORAL
  Filled 2022-10-11 (×3): qty 1

## 2022-10-11 MED ORDER — CHLORHEXIDINE GLUCONATE 0.12 % MT SOLN: 15.0000 mL | Freq: Once | OROMUCOSAL | Status: AC

## 2022-10-11 MED ORDER — SODIUM CHLORIDE 0.9% FLUSH: 3.0000 mL | INTRAVENOUS | Status: DC | PRN

## 2022-10-11 SURGICAL SUPPLY — 44 items
APL SKNCLS STERI-STRIP NONHPOA (GAUZE/BANDAGES/DRESSINGS) ×1
BAG COUNTER SPONGE SURGICOUNT (BAG) ×1 IMPLANT
BAG SPNG CNTER NS LX DISP (BAG) ×1
BAND INSRT 18 STRL LF DISP RB (MISCELLANEOUS) ×2
BAND RUBBER #18 3X1/16 STRL (MISCELLANEOUS) ×2 IMPLANT
BENZOIN TINCTURE PRP APPL 2/3 (GAUZE/BANDAGES/DRESSINGS) ×1 IMPLANT
BUR CARBIDE MATCH 3.0 (BURR) ×1 IMPLANT
CANISTER SUCT 3000ML PPV (MISCELLANEOUS) ×1 IMPLANT
DRAPE LAPAROTOMY 100X72X124 (DRAPES) ×1 IMPLANT
DRAPE MICROSCOPE SLANT 54X150 (MISCELLANEOUS) ×1 IMPLANT
DRAPE SURG 17X23 STRL (DRAPES) ×1 IMPLANT
DRSG OPSITE POSTOP 3X4 (GAUZE/BANDAGES/DRESSINGS) IMPLANT
DURAPREP 26ML APPLICATOR (WOUND CARE) ×1 IMPLANT
ELECT REM PT RETURN 9FT ADLT (ELECTROSURGICAL) ×1
ELECTRODE REM PT RTRN 9FT ADLT (ELECTROSURGICAL) ×1 IMPLANT
GAUZE 4X4 16PLY ~~LOC~~+RFID DBL (SPONGE) IMPLANT
GLOVE BIO SURGEON STRL SZ7 (GLOVE) IMPLANT
GLOVE BIO SURGEON STRL SZ8 (GLOVE) ×1 IMPLANT
GLOVE BIOGEL PI IND STRL 7.0 (GLOVE) IMPLANT
GOWN STRL REUS W/ TWL LRG LVL3 (GOWN DISPOSABLE) IMPLANT
GOWN STRL REUS W/ TWL XL LVL3 (GOWN DISPOSABLE) ×1 IMPLANT
GOWN STRL REUS W/TWL 2XL LVL3 (GOWN DISPOSABLE) IMPLANT
GOWN STRL REUS W/TWL LRG LVL3 (GOWN DISPOSABLE)
GOWN STRL REUS W/TWL XL LVL3 (GOWN DISPOSABLE) ×1
HEMOSTAT POWDER KIT SURGIFOAM (HEMOSTASIS) ×1 IMPLANT
KIT BASIN OR (CUSTOM PROCEDURE TRAY) ×1 IMPLANT
KIT TURNOVER KIT B (KITS) ×1 IMPLANT
NDL HYPO 25X1 1.5 SAFETY (NEEDLE) ×1 IMPLANT
NDL SPNL 20GX3.5 QUINCKE YW (NEEDLE) IMPLANT
NEEDLE HYPO 25X1 1.5 SAFETY (NEEDLE) ×1 IMPLANT
NEEDLE SPNL 20GX3.5 QUINCKE YW (NEEDLE) IMPLANT
NS IRRIG 1000ML POUR BTL (IV SOLUTION) ×1 IMPLANT
PACK LAMINECTOMY NEURO (CUSTOM PROCEDURE TRAY) ×1 IMPLANT
PAD ARMBOARD 7.5X6 YLW CONV (MISCELLANEOUS) ×3 IMPLANT
SOL ELECTROSURG ANTI STICK (MISCELLANEOUS) ×1
SOLUTION ELECTROSURG ANTI STCK (MISCELLANEOUS) ×1 IMPLANT
STRIP CLOSURE SKIN 1/2X4 (GAUZE/BANDAGES/DRESSINGS) ×1 IMPLANT
SUT VIC AB 0 CT1 18XCR BRD8 (SUTURE) ×1 IMPLANT
SUT VIC AB 0 CT1 8-18 (SUTURE) ×1
SUT VIC AB 2-0 CP2 18 (SUTURE) ×1 IMPLANT
SUT VIC AB 3-0 SH 8-18 (SUTURE) ×1 IMPLANT
TOWEL GREEN STERILE (TOWEL DISPOSABLE) ×1 IMPLANT
TOWEL GREEN STERILE FF (TOWEL DISPOSABLE) ×1 IMPLANT
WATER STERILE IRR 1000ML POUR (IV SOLUTION) ×1 IMPLANT

## 2022-10-11 NOTE — Transfer of Care (Signed)
Immediate Anesthesia Transfer of Care Note  Patient: Cheryl Martin  Procedure(s) Performed: Left Lumbar Three-Four, Lumbar Four-Five Laminectomy and Foraminotomy (Left: Back)  Patient Location: PACU  Anesthesia Type:General  Level of Consciousness: awake, alert , and oriented  Airway & Oxygen Therapy: Patient connected to face mask oxygen  Post-op Assessment: Report given to RN  Post vital signs: stable  Last Vitals:  Vitals Value Taken Time  BP 140/60 10/11/22 1144  Temp 36.8 C 10/11/22 1144  Pulse 65 10/11/22 1144  Resp 11 10/11/22 1144  SpO2 97 % 10/11/22 1144  Vitals shown include unvalidated device data.  Last Pain:  Vitals:   10/11/22 1215  TempSrc:   PainSc: 5       Patients Stated Pain Goal: 3 ([id number])  Complications: No notable events documented.

## 2022-10-11 NOTE — TOC Initial Note (Signed)
Transition of Care [facility]) - Initial/Assessment Note    Patient Details  Name: Cheryl Martin MRN: [id number] Date of Birth: Mar 06, 1931  Transition of Care [facility]) CM/SW Contact:    Levonne Lapping, RN Phone Number: 10/11/2022, 2:26 PM  Clinical Narrative:         Patient will DC to home with a rolling walker provided by [employer]. Daughter will transport.  Possible DC tomorrow TOC will continue to follow patient for any additional discharge needs              Expected Discharge Plan: Home/Self Care Barriers to Discharge: Continued Medical Work up   Patient Goals and CMS Choice Patient states their goals for this hospitalization and ongoing recovery are:: Go home CMS Medicare.gov Compare Post Acute Care list provided to:: Patient Choice offered to / list presented to : Patient      Expected Discharge Plan and Services   Discharge Planning Services: CM Consult Post Acute Care Choice: Durable Medical Equipment Living arrangements for the past 2 months: Single Family Home                 DME Arranged: Walker rolling DME Agency: [employer] Date DME Agency Contacted: 10/11/22 Time DME Agency Contacted: [phone] Representative spoke with at DME Agency: [place]: NA [place] Agency: NA        Prior Living Arrangements/Services Living arrangements for the past 2 months: [place] with:: Adult Children Patient language and need for interpreter reviewed:: Yes Do you feel safe going back to the place where you live?: Yes      Need for Family Participation in Patient Care: No (Comment) Care giver support system in place?: Yes (comment) (Daughters)   Criminal Activity/Legal Involvement Pertinent to Current Situation/Hospitalization: No - Comment as needed  Activities of Daily Living      Permission Sought/Granted   Permission granted to share information with : Yes, Verbal Permission Granted     Permission granted to share info w AGENCY: DME         Emotional Assessment   Attitude/Demeanor/Rapport: Engaged Affect (typically observed): Unable to Assess Orientation: : Oriented to Self, Oriented to Place, Oriented to  Time, Oriented to Situation Alcohol / Substance Use: Not Applicable Psych Involvement: No (comment)  Admission diagnosis:  S/P lumbar laminectomy [Z98.890] Patient Active Problem List   Diagnosis Date Noted   S/P lumbar laminectomy 10/11/2022   Dizziness 09/26/2021   Dissociated deviation of eye movements 11/30/2020   Degenerative spondylolisthesis 09/16/2019   Scoliosis deformity of spine 09/16/2019   Bronchiectasis without complication ([place]) [id number]   Multiple pulmonary nodules assoc with bronchiectasis 03/02/2019   Hernia, hiatal 02/17/2019   History of transient ischemic attack (TIA) 01/05/2019   Environmental and seasonal allergies- aside from above sx 10/21/2018   SOB (shortness of breath) on exertion 10/21/2018   Nail abnormalities-  longitudinal nail ridges 06/08/2018   Vegan diet 06/08/2018   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   Chronic constipation 06/03/2017   Preoperative general physical examination 07/20/2016   Urinary incontinence 05/24/2016   OA (osteoarthritis) of knee 04/15/2016   Chronic pain of left knee 04/11/2016   Senile solar keratosis 03/14/2016   Peripheral edema- bilateral lower extremities 03/14/2016   ETD- Sx of L ear 03/14/2016   Hyperlipidemia 02/24/2016   Vitamin D deficiency 02/22/2016   ADD (attention deficit disorder) 02/19/2011   Decreased hearing 02/19/2011   PCP:  Velva Harman, PA Pharmacy:   [id number] - [place], [place] -  [address] [place] [address] [phone] [phone]     Social Determinants of Health (SDOH) Social History: SDOH Screenings   Depression (PHQ2-9): Medium Risk (05/14/2022)  Tobacco Use: Medium Risk (10/11/2022)   SDOH Interventions:     Readmission Risk Interventions     No  data to display

## 2022-10-11 NOTE — Op Note (Signed)
10/11/2022  11:43 AM  PATIENT:  Cheryl Martin  89 y.o. 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 Imaging showed sniffing and left lateral recess stenosis L3-4 and L4-5. The patient tried conservative measures without relief. Pain was debilitating. Recommended compressive hemilaminectomy L3-4 and L4-5 on the left. Patient understood the risks, benefits, and alternatives and potential outcomes and wished to proceed.  PROCEDURE DETAILS: The patient was taken to the operating room and after induction of adequate generalized endotracheal anesthesia, the patient was rolled into the prone position on the Wilson frame and all pressure points were padded. The lumbar region was cleaned and then prepped with DuraPrep and draped in the usual sterile fashion. 5 cc of local anesthesia was injected and then a dorsal midline incision was made and carried down to the lumbo sacral fascia. The fascia was opened and the paraspinous musculature was taken down in a subperiosteal fashion to expose L3 4 and L4-5 on the left. Intraoperative x-ray confirmed my level and all in the room agreed, and then I used a combination of the high-speed drill and the Kerrison punches to perform a hemilaminectomy, medial facetectomy, and foraminotomy at L3-4 and L4-5 on the left. The underlying yellow ligament was opened and removed in a piecemeal fashion to expose the underlying dura and exiting nerve root. I undercut the lateral recess and dissected down until I was  medial to and distal to the pedicle. The nerve root was well decompressed. We then gently retracted the nerve root medially with a retractor, coagulated the epidural venous vasculature, and inspected the disc space.  There is no significant disc herniation in either level.  I then palpated with a coronary dilator along the nerve root and into the foramen to assure adequate decompression. I felt no more compression of the nerve root. I irrigated with saline solution containing bacitracin. Achieved hemostasis with bipolar cautery, lined the dura with Gelfoam, and then closed the fascia with 0 Vicryl. I closed the subcutaneous tissues with 2-0 Vicryl and the subcuticular tissues with 3-0 Vicryl. The skin was then closed with benzoin and Steri-Strips. The drapes were removed, a sterile dressing was applied.  the patient was awakened from general anesthesia and transferred to the recovery room in stable condition. At the end of the procedure all sponge, needle and instrument counts were correct.    PLAN OF CARE: Admit for overnight observation  PATIENT DISPOSITION:  PACU - hemodynamically stable.   Delay start of Pharmacological VTE agent (>24hrs) due to surgical blood loss or risk of bleeding:  yes

## 2022-10-11 NOTE — Anesthesia Procedure Notes (Signed)
Procedure Name: Intubation Date/Time: 10/11/2022 10:10 AM  Performed by: Lavell Luster, CRNAPre-anesthesia Checklist: Patient identified, Emergency Drugs available, Suction available, Patient being monitored and Timeout performed Patient Re-evaluated:Patient Re-evaluated prior to induction Oxygen Delivery Method: Circle system utilized Preoxygenation: Pre-oxygenation with 100% oxygen Induction Type: IV induction Ventilation: Mask ventilation without difficulty Laryngoscope Size: Mac and 3 Grade View: Grade I Tube type: Oral Tube size: 7.5 mm Number of attempts: 1 Airway Equipment and Method: Stylet Placement Confirmation: ETT inserted through vocal cords under direct vision, positive ETCO2 and breath sounds checked- equal and bilateral Secured at: 21 cm Tube secured with: Tape Dental Injury: Teeth and Oropharynx as per pre-operative assessment
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
## 2022-10-12 ENCOUNTER — Encounter (HOSPITAL_COMMUNITY): Payer: Self-pay | Admitting: Neurological Surgery

## 2022-10-12 DIAGNOSIS — M5416 Radiculopathy, lumbar region: Secondary | ICD-10-CM | POA: Diagnosis not present

## 2022-10-12 MED ORDER — TRAMADOL HCL 50 MG PO TABS: 50.0000 mg | ORAL_TABLET | Freq: Four times a day (QID) | ORAL | 0 refills | Status: DC | PRN

## 2022-10-12 NOTE — Evaluation (Signed)
Occupational Therapy Evaluation Patient Details Name: Cheryl Martin MRN: [id number] DOB: 12-11-1930 Today's Date: 10/12/2022   History of Present Illness Patient is an 89 yo female presenting to the hospital for left L3-4 and L4-5 hemilaminectomy medial facetectomy foraminotomies due to stenosis on 10/11/22. PMH includes: Anemia, OA, HLD, TIA, and CAD   Clinical Impression   Prior to this admission, patient living with her daughter, independent in ADLs and functional mobility (occasionally using a cane) and driving short distances. Currently, patient presenting with pain at incision site, minimal tingling in L leg (predominantly in calf) and need for minimal increased assist to complete ADLs and functional mobility. Patient min guard for transfers with RW, and min guard for ADLs with need for assist to complete lower body dressing. OT recommending no OT follow up at this time, and recommending a BSC for home use. OT signing off at this time at patient has no further acute OT needs; please re-consult if further needs arise.      Recommendations for follow up therapy are one component of a multi-disciplinary discharge planning process, led by the attending physician.  Recommendations may be updated based on patient status, additional functional criteria and insurance authorization.   Follow Up Recommendations  No OT follow up     Assistance Recommended at Discharge Frequent or constant Supervision/Assistance (initially)  Patient can return home with the following A little help with walking and/or transfers;A little help with bathing/dressing/bathroom;Assistance with cooking/housework;Assist for transportation;Help with stairs or ramp for entrance    Functional Status Assessment  Patient has had a recent decline in their functional status and demonstrates the ability to make significant improvements in function in a reasonable and predictable amount of time.  Equipment Recommendations   BSC/3in1    Recommendations for Other Services       Precautions / Restrictions Precautions Precautions: Back Precaution Booklet Issued: Yes (comment) Precaution Comments: able to recite and maintain precautions once educated Restrictions Weight Bearing Restrictions: No      Mobility Bed Mobility Overal bed mobility: Needs Assistance             General bed mobility comments: patient sitting EOB upon OT arrival    Transfers Overall transfer level: Needs assistance Equipment used: Rolling walker (2 wheels) Transfers: Sit to/from Stand Sit to Stand: Min guard           General transfer comment: min gaurd for safety through sit<>stands      Balance Overall balance assessment: Needs assistance Sitting-balance support: Bilateral upper extremity supported, No upper extremity supported Sitting balance-Leahy Scale: Fair     Standing balance support: No upper extremity supported, Bilateral upper extremity supported, During functional activity Standing balance-Leahy Scale: Fair Standing balance comment: is able to complete activities in standing without external support                           ADL either performed or assessed with clinical judgement   ADL Overall ADL's : Needs assistance/impaired Eating/Feeding: Set up;Sitting   Grooming: Set up;Standing;Wash/dry hands;Wash/dry face;Oral care;Applying deodorant;Brushing hair   Upper Body Bathing: Set up;Sitting   Lower Body Bathing: Min guard;Sit to/from stand;Sitting/lateral leans   Upper Body Dressing : Set up;Sitting;Standing   Lower Body Dressing: Minimal assistance;Sit to/from stand;Adhering to back precautions;Sitting/lateral leans   Toilet Transfer: Min guard;Ambulation;Rolling walker (2 wheels)   Toileting- Clothing Manipulation and Hygiene: Min guard;Sitting/lateral lean;Sit to/from stand;Adhering to back precautions  Functional mobility during ADLs: Min guard;Rolling walker (2  wheels) General ADL Comments: Patient presenting with pain at incision site, minimal tingling in L leg (predominantly in calf) and need for minimal increased assist to complete ADLs and functional mobility.     Vision Baseline Vision/History: 1 Wears glasses Ability to See in Adequate Light: 0 Adequate Patient Visual Report: No change from baseline Vision Assessment?: No apparent visual deficits Additional Comments: has had some blurred vision in L eye for the past week, but no changes after surgery     Perception     Praxis      Pertinent Vitals/Pain Pain Assessment Pain Assessment: Faces Faces Pain Scale: Hurts little more Pain Location: L leg/groin area, and incision at back Pain Descriptors / Indicators: Discomfort, Grimacing, Guarding Pain Intervention(s): Limited activity within patient's tolerance, Monitored during session, Repositioned     Hand Dominance Right   Extremity/Trunk Assessment Upper Extremity Assessment Upper Extremity Assessment: Overall WFL for tasks assessed;Generalized weakness   Lower Extremity Assessment Lower Extremity Assessment: Overall WFL for tasks assessed;Generalized weakness   Cervical / Trunk Assessment Cervical / Trunk Assessment: Back Surgery   Communication Communication Communication: No difficulties   Cognition Arousal/Alertness: Awake/alert Behavior During Therapy: WFL for tasks assessed/performed Overall Cognitive Status: Within Functional Limits for tasks assessed                                 General Comments: patient resports mild STM deficits at baseline, however alert and appropriate throughout evaluation     General Comments       Exercises     Shoulder Instructions      Home Living Family/patient expects to be discharged to:: Private residence Living Arrangements: Children Available Help at Discharge: Family;Available PRN/intermittently Type of Home: House Home Access: Stairs to enter [employer] of Steps: 3 Entrance Stairs-Rails: Left Home Layout: One level     Bathroom Shower/Tub: Occupational psychologist: Standard     Home Equipment: Cane - single point   Additional Comments: independent      Prior Functioning/Environment Prior Level of Function : Independent/Modified Independent;Driving             Mobility Comments: independent ADLs Comments: independent, drives occasionally, but only short distances        OT Problem List: Decreased strength;Decreased activity tolerance;Impaired balance (sitting and/or standing);Decreased knowledge of precautions;Pain      OT Treatment/Interventions:      OT Goals(Current goals can be found in the care plan section) Acute Rehab OT Goals Patient Stated Goal: to get back to church in person OT Goal Formulation: With patient Time For Goal Achievement: 10/26/22 Potential to Achieve Goals: Good  OT Frequency:      Co-evaluation              AM-PAC OT "6 Clicks" Daily Activity     Outcome Measure Help from another person eating meals?: None Help from another person taking care of personal grooming?: None Help from another person toileting, which includes using toliet, bedpan, or urinal?: A Little Help from another person bathing (including washing, rinsing, drying)?: A Little Help from another person to put on and taking off regular upper body clothing?: None Help from another person to put on and taking off regular lower body clothing?: A Little 6 Click Score: 21   End of Session Equipment Utilized During Treatment: Rolling walker (2 wheels) Nurse Communication: Mobility  status  Activity Tolerance: Patient tolerated treatment well Patient left: in bed;with call bell/phone within reach (sitting EOB)  OT Visit Diagnosis: Unsteadiness on feet (R26.81);Other abnormalities of gait and mobility (R26.89);Muscle weakness (generalized) (M62.81);Pain Pain - Right/Left: Left Pain - part of body:  Leg                Time: [id number] OT Time Calculation (min): 31 min Charges:  OT General Charges $OT Visit: 1 Visit OT Evaluation $OT Eval Moderate Complexity: 1 Mod OT Treatments $Self Care/Home Management : 8-22 mins  Cheryl Martin, OTR/L Acute Rehabilitation Services [phone]   Ascencion Dike 10/12/2022, 9:05 AM

## 2022-10-12 NOTE — Discharge Instructions (Signed)
Wound Care Keep incision covered and dry for three days.  .  Do not put any creams, lotions, or ointments on incision. Leave steri-strips on back.  They will fall off by themselves. Activity Walk each and every day, increasing distance each day. No lifting greater than 5 lbs.  Avoid excessive neck motion. No driving for 2 weeks; may ride as a passenger locally.  Diet Resume your normal diet.   Call Your Doctor If Any of These Occur Redness, drainage, or swelling at the wound.  Temperature greater than 101 degrees. Severe pain not relieved by pain medication. Incision starts to come apart. Follow Up Appt Call [phone])  for problems.  If you have any hardware placed in your spine, you will need an x-ray before your appointment.  

## 2022-10-12 NOTE — Plan of Care (Signed)

## 2022-10-12 NOTE — Discharge Summary (Signed)
  Discharge Summary   Date of Admission: 10/11/22   Date of Discharge: 10/12/22   Attending Physician:  Sherley Bounds, MD  Hospital Course: Patient was admitted following an uncomplicated Left [id number] and L4-5 hemilaminectomy medial facetectomy foraminotomies. They were recovered in PACU and transferred to [facility]. Their preop symptoms were improved. Pt to f/u in office in 2 weeks for post op visit. Pt is in agreement w/ plan.    Neurologic exam at discharge:  A&O x3 Strength 5/5 x4.  SILTx4.  Incisions c/d/I.  Pt ambulating without difficulty.      Discharge diagnosis: Left lateral recess stenosis L3-4 L4-5 with left lower extremity radiculopathy    Caroline More, [facility]

## 2022-10-12 NOTE — Progress Notes (Signed)
Patient alert and oriented, pt. Void, ambulate. Surgical site clean and dry. D/c instructions explain and given to the patient all questions answered. Pt. D/c home per order.

## 2022-10-14 ENCOUNTER — Telehealth: Payer: Self-pay

## 2022-10-14 NOTE — Transitions of Care (Post Inpatient/ED Visit) (Signed)
   10/14/2022  Name: Cheryl Martin MRN: [id number] DOB: 1930-12-11  Today's TOC FU Call Status: Today's TOC FU Call Status:: Successful TOC FU Call Competed TOC FU Call Complete Date: 10/14/22  Transition Care Management Follow-up Telephone Call Date of Discharge: 10/12/22 [facility]) Type of Discharge: Inpatient Admission Primary Inpatient Discharge Diagnosis:: Left lateral recess stenosis L3-4 L4-5 with left lower extremity radiculopathy How have you been since you were released from the hospital?: Better Any questions or concerns?: No  Items Reviewed: Did you receive and understand the discharge instructions provided?: Yes Medications obtained and verified?: Yes (Medications Reviewed) Any new allergies since your discharge?: No Dietary orders reviewed?: Yes Do you have support at home?: Yes People in Home: other relative(s)  Home Care and Equipment/Supplies: [place] Ordered?: No Any new equipment or medical supplies ordered?: Yes Name of Medical supply agency?: unknown (walker/bedside commode) Were you able to get the equipment/medical supplies?: Yes Do you have any questions related to the use of the equipment/supplies?: No  Functional Questionnaire: Do you need assistance with bathing/showering or dressing?: No Do you need assistance with meal preparation?: No Do you need assistance with eating?: No Do you have difficulty maintaining continence: No Do you need assistance with getting out of bed/getting out of a chair/moving?: No Do you have difficulty managing or taking your medications?: No  Follow up appointments reviewed: PCP Follow-up appointment confirmed?: Yes Date of PCP follow-up appointment?: 10/23/21 Follow-up Provider: Virgil Benedict, [facility] Follow-up appointment confirmed?: Yes Date of Specialist follow-up appointment?: 10/22/21 Follow-Up Specialty Provider:: Caroline More, [facility] Do you need  transportation to your follow-up appointment?: No Do you understand care options if your condition(s) worsen?: Yes-patient verbalized understanding    Norton Blizzard, [place] ([place])  [place] [phone]

## 2022-10-14 NOTE — Anesthesia Postprocedure Evaluation (Signed)
Anesthesia Post Note  Patient: MALAKAI BEATON  Procedure(s) Performed: Left Lumbar Three-Four, Lumbar Four-Five Laminectomy and Foraminotomy (Left: Back)     Patient location during evaluation: Cath Lab Anesthesia Type: General Level of consciousness: patient cooperative and awake Pain management: pain level controlled Vital Signs Assessment: post-procedure vital signs reviewed and stable Respiratory status: spontaneous breathing, nonlabored ventilation, respiratory function stable and patient connected to nasal cannula oxygen Cardiovascular status: blood pressure returned to baseline and stable Postop Assessment: no apparent nausea or vomiting Anesthetic complications: no   No notable events documented.  Last Vitals:  Vitals:   10/12/22 0311 10/12/22 0754  BP: (!) 106/54 106/68  Pulse: 70 74  Resp: 16 20  Temp: 36.9 C 36.4 C  SpO2: 95% 98%    Last Pain:  Vitals:   10/12/22 0900  TempSrc:   PainSc: 2                  Joenathan Sakuma

## 2022-10-17 ENCOUNTER — Ambulatory Visit (INDEPENDENT_AMBULATORY_CARE_PROVIDER_SITE_OTHER): Payer: Medicare HMO

## 2022-10-17 DIAGNOSIS — G459 Transient cerebral ischemic attack, unspecified: Secondary | ICD-10-CM | POA: Diagnosis not present

## 2022-10-18 LAB — CUP PACEART REMOTE DEVICE CHECK
Date Time Interrogation Session: 20240328231327
Implantable Pulse Generator Implant Date: 20231115

## 2022-10-25 ENCOUNTER — Ambulatory Visit (INDEPENDENT_AMBULATORY_CARE_PROVIDER_SITE_OTHER): Payer: Medicare HMO | Admitting: Family Medicine

## 2022-10-25 ENCOUNTER — Encounter: Payer: Self-pay | Admitting: Family Medicine

## 2022-10-25 VITALS — BP 146/71 | HR 61 | Resp 18 | Ht 63.0 in | Wt 128.0 lb

## 2022-10-25 DIAGNOSIS — E785 Hyperlipidemia, unspecified: Secondary | ICD-10-CM

## 2022-10-25 DIAGNOSIS — Z9889 Other specified postprocedural states: Secondary | ICD-10-CM

## 2022-10-25 NOTE — Assessment & Plan Note (Signed)
Patient doing well.  Encouraged her to continue with pain medication as needed.  I will be looking for the update from her visit with neurosurgery in the upcoming week.  I sent a referral for home health for her to be evaluated.

## 2022-10-25 NOTE — Patient Instructions (Signed)
I have sent to the referral in for a consultation with home health.  You should be getting a call from [facility].

## 2022-10-25 NOTE — Assessment & Plan Note (Signed)
Patient is fasting today, would like to complete lab work ordered at previous visit.  Drawing cholesterol panel and CMP today.

## 2022-10-25 NOTE — Progress Notes (Signed)
Established Patient Office Visit  Subjective   Patient ID: LEATHA BUCKMASTER, female    DOB: [birth year]  Age: 89 y.o. MRN: [id number]  Chief Complaint  Patient presents with   Hospitalization Follow-up    HPI DEIDREA NIEBEL is a 89 y.o. female presenting today with her daughter for follow up of hospital admission following uncomplicated left L3-4 and L4-5 hemilaminectomy medial facetectomy foraminotomies.  Surgery was planned, intended to alleviate severe lower back pain radiating to the left lower extremity.  She stayed overnight for observation after the surgery.  Overall, she has been healing well since the surgery.  She has been able to manage her pain with 1 dose of tramadol daily, 1 dose of Tylenol daily.  She has not taken any pain medication today and is in noticeable pain in her left leg and groin.  She states that ever since the surgery, she has been experiencing a tingling sort of ache in her left leg that she has not experienced before.  Otherwise, states that she is doing well.  Her daughter has also recently undergone surgery and may be returning to work soon, so they are both also interested in obtaining home health for [place].  ROS Negative unless otherwise noted in HPI   Objective:     BP (!) 146/71 (BP Location: Right Arm, Patient Position: Sitting, Cuff Size: Normal)   Pulse 61   Resp 18   Ht 5\' 3"  (1.6 m)   Wt 128 lb (58.1 kg)   SpO2 98%   BMI 22.67 kg/m   Physical Exam Constitutional:      General: She is not in acute distress.    Appearance: Normal appearance.  HENT:     Head: Normocephalic and atraumatic.  Cardiovascular:     Rate and Rhythm: Normal rate and regular rhythm.     Pulses: Normal pulses.     Heart sounds: No murmur heard.    No friction rub. No gallop.  Pulmonary:     Effort: Pulmonary effort is normal. No respiratory distress.     Breath sounds: No wheezing, rhonchi or rales.  Skin:    General: Skin is warm and dry.      Findings: Ecchymosis present. No rash.     Comments: Midline vertical incision over lumbar back that is healing nicely.  Bandage still in place over the lowest portion with some dried blood, is to be removed at neurosurgery follow-up appointment in 4 days.  Mild ecchymosis present bilaterally over buttocks.  Neurological:     Mental Status: She is alert and oriented to person, place, and time.     Assessment & Plan:  Hyperlipidemia, unspecified hyperlipidemia type Assessment & Plan: Patient is fasting today, would like to complete lab work ordered at previous visit.  Drawing cholesterol panel and CMP today.  Orders: -     Lipid panel; Future -     Comprehensive metabolic panel  S/P lumbar laminectomy Assessment & Plan: Patient doing well.  Encouraged her to continue with pain medication as needed.  I will be looking for the update from her visit with neurosurgery in the upcoming week.  I sent a referral for home health for her to be evaluated.  Orders: -     Ambulatory referral to Home Health  Blood pressure was elevated today which I suspect is secondary to current pain levels.  Return in 6 months (on 04/14/2023) for Medicare annual wellness visit, fasting blood work 1 week before.  Velva Harman, PA

## 2022-10-26 LAB — COMPREHENSIVE METABOLIC PANEL
ALT: 11 IU/L (ref 0–32)
AST: 16 IU/L (ref 0–40)
Albumin/Globulin Ratio: 2.2 (ref 1.2–2.2)
Albumin: 4.2 g/dL (ref 3.7–4.7)
Alkaline Phosphatase: 109 IU/L (ref 44–121)
BUN/Creatinine Ratio: 24 (ref 12–28)
BUN: 17 mg/dL (ref 8–27)
Bilirubin Total: 0.3 mg/dL (ref 0.0–1.2)
CO2: 24 mmol/L (ref 20–29)
Calcium: 9.8 mg/dL (ref 8.7–10.3)
Chloride: 103 mmol/L (ref 96–106)
Creatinine, Ser: 0.7 mg/dL (ref 0.57–1.00)
Globulin, Total: 1.9 g/dL (ref 1.5–4.5)
Glucose: 83 mg/dL (ref 70–99)
Potassium: 4.5 mmol/L (ref 3.5–5.2)
Sodium: 139 mmol/L (ref 134–144)
Total Protein: 6.1 g/dL (ref 6.0–8.5)
eGFR: 83 mL/min/{1.73_m2} (ref >59)

## 2022-10-26 LAB — LIPID PANEL
Chol/HDL Ratio: 2.6 ratio (ref 0.0–4.4)
Cholesterol, Total: 204 mg/dL — ABNORMAL HIGH (ref 100–199)
HDL: 80 mg/dL (ref >39)
LDL Chol Calc (NIH): 111 mg/dL — ABNORMAL HIGH (ref 0–99)
Triglycerides: 73 mg/dL (ref 0–149)
VLDL Cholesterol Cal: 13 mg/dL (ref 5–40)

## 2022-10-28 ENCOUNTER — Telehealth: Payer: Self-pay | Admitting: *Deleted

## 2022-10-28 NOTE — Telephone Encounter (Addendum)
Cheryl Martin [id number] [birth year]   Provider- Apolinar Junes   Procedure [id number] Drug [id number]    OAB- N32.81:________X_________________  Neurogenic Bladder N31.2: ______________  Mixed Incontinence N39.46_______________  Urge Incontinence N39.41 ________________  Units: 100___X_____           200____________  Expected date of injection:___7/16/2024_____________   Below to be completed by staff member contacting insurance.   Botox verification completed- Yes sent to [facility] 10/28/22  Pa needed- Yes faxed paper copy to [facility] contacted -  complete         Auth number:_[id number]____  Approval dates : ___5/6/24-05/06/25_________       Botox scheduled:________7/16/24______________  U/A & Culture ordered and scheduled:____________________  Pt aware and instructions given.   JQ aware to order Botox. Botox labeled in fridge  Date:

## 2022-10-28 NOTE — Progress Notes (Signed)
[facility] Summary Report / Loop Recorder 

## 2022-10-30 NOTE — Addendum Note (Signed)
Addended by: Saralyn Pilar on: 10/30/2022 01:26 PM   Modules accepted: Orders

## 2022-10-31 ENCOUNTER — Encounter: Payer: Self-pay | Admitting: Family Medicine

## 2022-11-06 NOTE — Addendum Note (Signed)
Addended by: Saralyn Pilar on: 11/06/2022 04:36 PM   Modules accepted: Orders

## 2022-11-11 ENCOUNTER — Emergency Department (HOSPITAL_COMMUNITY): Payer: Medicare HMO

## 2022-11-11 ENCOUNTER — Encounter (HOSPITAL_COMMUNITY): Payer: Self-pay | Admitting: Emergency Medicine

## 2022-11-11 ENCOUNTER — Inpatient Hospital Stay (HOSPITAL_COMMUNITY)
Admission: EM | Admit: 2022-11-11 | Discharge: 2022-11-21 | DRG: 336 | Disposition: A | Discharge status: Home Health | Payer: Medicare HMO | Attending: Internal Medicine | Admitting: Internal Medicine

## 2022-11-11 ENCOUNTER — Other Ambulatory Visit: Payer: Self-pay

## 2022-11-11 ENCOUNTER — Telehealth: Payer: Self-pay

## 2022-11-11 DIAGNOSIS — Z66 Do not resuscitate: Secondary | ICD-10-CM | POA: Diagnosis present

## 2022-11-11 DIAGNOSIS — Z79890 Hormone replacement therapy (postmenopausal): Secondary | ICD-10-CM | POA: Diagnosis not present

## 2022-11-11 DIAGNOSIS — G459 Transient cerebral ischemic attack, unspecified: Secondary | ICD-10-CM | POA: Diagnosis not present

## 2022-11-11 DIAGNOSIS — N3941 Urge incontinence: Secondary | ICD-10-CM | POA: Diagnosis present

## 2022-11-11 DIAGNOSIS — Z9109 Other allergy status, other than to drugs and biological substances: Secondary | ICD-10-CM | POA: Diagnosis not present

## 2022-11-11 DIAGNOSIS — E871 Hypo-osmolality and hyponatremia: Secondary | ICD-10-CM | POA: Diagnosis present

## 2022-11-11 DIAGNOSIS — Z9841 Cataract extraction status, right eye: Secondary | ICD-10-CM | POA: Diagnosis not present

## 2022-11-11 DIAGNOSIS — K56609 Unspecified intestinal obstruction, unspecified as to partial versus complete obstruction: Secondary | ICD-10-CM | POA: Diagnosis present

## 2022-11-11 DIAGNOSIS — K5989 Other specified functional intestinal disorders: Secondary | ICD-10-CM | POA: Diagnosis not present

## 2022-11-11 DIAGNOSIS — I700 Atherosclerosis of aorta: Secondary | ICD-10-CM | POA: Diagnosis not present

## 2022-11-11 DIAGNOSIS — R188 Other ascites: Secondary | ICD-10-CM | POA: Diagnosis present

## 2022-11-11 DIAGNOSIS — Z8673 Personal history of transient ischemic attack (TIA), and cerebral infarction without residual deficits: Secondary | ICD-10-CM | POA: Diagnosis not present

## 2022-11-11 DIAGNOSIS — K567 Ileus, unspecified: Secondary | ICD-10-CM | POA: Diagnosis not present

## 2022-11-11 DIAGNOSIS — I10 Essential (primary) hypertension: Secondary | ICD-10-CM | POA: Diagnosis present

## 2022-11-11 DIAGNOSIS — M419 Scoliosis, unspecified: Secondary | ICD-10-CM | POA: Diagnosis present

## 2022-11-11 DIAGNOSIS — E876 Hypokalemia: Secondary | ICD-10-CM | POA: Diagnosis not present

## 2022-11-11 DIAGNOSIS — Z96652 Presence of left artificial knee joint: Secondary | ICD-10-CM | POA: Diagnosis not present

## 2022-11-11 DIAGNOSIS — Z9049 Acquired absence of other specified parts of digestive tract: Secondary | ICD-10-CM

## 2022-11-11 DIAGNOSIS — M79675 Pain in left toe(s): Secondary | ICD-10-CM | POA: Diagnosis present

## 2022-11-11 DIAGNOSIS — H9190 Unspecified hearing loss, unspecified ear: Secondary | ICD-10-CM | POA: Diagnosis present

## 2022-11-11 DIAGNOSIS — E861 Hypovolemia: Secondary | ICD-10-CM | POA: Diagnosis not present

## 2022-11-11 DIAGNOSIS — E039 Hypothyroidism, unspecified: Secondary | ICD-10-CM | POA: Diagnosis not present

## 2022-11-11 DIAGNOSIS — R1013 Epigastric pain: Secondary | ICD-10-CM | POA: Diagnosis not present

## 2022-11-11 DIAGNOSIS — Z888 Allergy status to other drugs, medicaments and biological substances status: Secondary | ICD-10-CM | POA: Diagnosis not present

## 2022-11-11 DIAGNOSIS — Z90711 Acquired absence of uterus with remaining cervical stump: Secondary | ICD-10-CM

## 2022-11-11 DIAGNOSIS — Z9889 Other specified postprocedural states: Secondary | ICD-10-CM

## 2022-11-11 DIAGNOSIS — Z743 Need for continuous supervision: Secondary | ICD-10-CM | POA: Diagnosis not present

## 2022-11-11 DIAGNOSIS — K5651 Intestinal adhesions [bands], with partial obstruction: Principal | ICD-10-CM | POA: Diagnosis present

## 2022-11-11 DIAGNOSIS — M1990 Unspecified osteoarthritis, unspecified site: Secondary | ICD-10-CM | POA: Diagnosis present

## 2022-11-11 DIAGNOSIS — Z87891 Personal history of nicotine dependence: Secondary | ICD-10-CM | POA: Diagnosis not present

## 2022-11-11 DIAGNOSIS — I2510 Atherosclerotic heart disease of native coronary artery without angina pectoris: Secondary | ICD-10-CM | POA: Diagnosis not present

## 2022-11-11 DIAGNOSIS — Z800 Family history of malignant neoplasm of digestive organs: Secondary | ICD-10-CM | POA: Diagnosis not present

## 2022-11-11 DIAGNOSIS — R109 Unspecified abdominal pain: Secondary | ICD-10-CM | POA: Diagnosis not present

## 2022-11-11 DIAGNOSIS — R001 Bradycardia, unspecified: Secondary | ICD-10-CM | POA: Diagnosis not present

## 2022-11-11 DIAGNOSIS — I959 Hypotension, unspecified: Secondary | ICD-10-CM | POA: Diagnosis not present

## 2022-11-11 DIAGNOSIS — Z4659 Encounter for fitting and adjustment of other gastrointestinal appliance and device: Secondary | ICD-10-CM | POA: Diagnosis not present

## 2022-11-11 DIAGNOSIS — M549 Dorsalgia, unspecified: Secondary | ICD-10-CM | POA: Diagnosis present

## 2022-11-11 DIAGNOSIS — E785 Hyperlipidemia, unspecified: Secondary | ICD-10-CM | POA: Diagnosis present

## 2022-11-11 DIAGNOSIS — R1084 Generalized abdominal pain: Secondary | ICD-10-CM | POA: Diagnosis not present

## 2022-11-11 DIAGNOSIS — R110 Nausea: Secondary | ICD-10-CM | POA: Diagnosis not present

## 2022-11-11 DIAGNOSIS — K5669 Other partial intestinal obstruction: Secondary | ICD-10-CM | POA: Diagnosis not present

## 2022-11-11 DIAGNOSIS — Z9842 Cataract extraction status, left eye: Secondary | ICD-10-CM

## 2022-11-11 DIAGNOSIS — Z7982 Long term (current) use of aspirin: Secondary | ICD-10-CM

## 2022-11-11 DIAGNOSIS — R1110 Vomiting, unspecified: Secondary | ICD-10-CM | POA: Diagnosis not present

## 2022-11-11 DIAGNOSIS — D638 Anemia in other chronic diseases classified elsewhere: Secondary | ICD-10-CM | POA: Diagnosis not present

## 2022-11-11 DIAGNOSIS — E559 Vitamin D deficiency, unspecified: Secondary | ICD-10-CM | POA: Diagnosis present

## 2022-11-11 DIAGNOSIS — R54 Age-related physical debility: Secondary | ICD-10-CM | POA: Diagnosis not present

## 2022-11-11 DIAGNOSIS — Z79899 Other long term (current) drug therapy: Secondary | ICD-10-CM

## 2022-11-11 DIAGNOSIS — K565 Intestinal adhesions [bands], unspecified as to partial versus complete obstruction: Secondary | ICD-10-CM | POA: Diagnosis not present

## 2022-11-11 DIAGNOSIS — F988 Other specified behavioral and emotional disorders with onset usually occurring in childhood and adolescence: Secondary | ICD-10-CM | POA: Diagnosis present

## 2022-11-11 DIAGNOSIS — K4090 Unilateral inguinal hernia, without obstruction or gangrene, not specified as recurrent: Secondary | ICD-10-CM | POA: Diagnosis not present

## 2022-11-11 LAB — I-STAT CHEM 8, ED
BUN: 10 mg/dL (ref 8–23)
Calcium, Ion: 1.08 mmol/L — ABNORMAL LOW (ref 1.15–1.40)
Chloride: 100 mmol/L (ref 98–111)
Creatinine, Ser: 0.6 mg/dL (ref 0.44–1.00)
Glucose, Bld: 100 mg/dL — ABNORMAL HIGH (ref 70–99)
HCT: 33 % — ABNORMAL LOW (ref 36.0–46.0)
Hemoglobin: 11.2 g/dL — ABNORMAL LOW (ref 12.0–15.0)
Potassium: 4 mmol/L (ref 3.5–5.1)
Sodium: 134 mmol/L — ABNORMAL LOW (ref 135–145)
TCO2: 27 mmol/L (ref 22–32)

## 2022-11-11 LAB — COMPREHENSIVE METABOLIC PANEL
ALT: 15 U/L (ref 0–44)
AST: 24 U/L (ref 15–41)
Albumin: 3.6 g/dL (ref 3.5–5.0)
Alkaline Phosphatase: 90 U/L (ref 38–126)
Anion gap: 8 (ref 5–15)
BUN: 9 mg/dL (ref 8–23)
CO2: 27 mmol/L (ref 22–32)
Calcium: 9.2 mg/dL (ref 8.9–10.3)
Chloride: 98 mmol/L (ref 98–111)
Creatinine, Ser: 0.71 mg/dL (ref 0.44–1.00)
GFR, Estimated: >60 mL/min (ref >60)
Glucose, Bld: 100 mg/dL — ABNORMAL HIGH (ref 70–99)
Potassium: 4.1 mmol/L (ref 3.5–5.1)
Sodium: 133 mmol/L — ABNORMAL LOW (ref 135–145)
Total Bilirubin: 0.8 mg/dL (ref 0.3–1.2)
Total Protein: 5.9 g/dL — ABNORMAL LOW (ref 6.5–8.1)

## 2022-11-11 LAB — CBC
HCT: 36.7 % (ref 36.0–46.0)
Hemoglobin: 12.5 g/dL (ref 12.0–15.0)
MCH: 31.6 pg (ref 26.0–34.0)
MCHC: 34.1 g/dL (ref 30.0–36.0)
MCV: 92.9 fL (ref 80.0–100.0)
Platelets: 226 10*3/uL (ref 150–400)
RBC: 3.95 MIL/uL (ref 3.87–5.11)
RDW: 12 % (ref 11.5–15.5)
WBC: 5.3 10*3/uL (ref 4.0–10.5)
nRBC: 0 % (ref 0.0–0.2)

## 2022-11-11 LAB — URINALYSIS, ROUTINE W REFLEX MICROSCOPIC
Bilirubin Urine: NEGATIVE
Glucose, UA: NEGATIVE mg/dL
Hgb urine dipstick: NEGATIVE
Ketones, ur: NEGATIVE mg/dL
Leukocytes,Ua: NEGATIVE
Nitrite: NEGATIVE
Protein, ur: NEGATIVE mg/dL
Specific Gravity, Urine: 1.011 (ref 1.005–1.030)
pH: 8 (ref 5.0–8.0)

## 2022-11-11 LAB — TROPONIN I (HIGH SENSITIVITY)
Troponin I (High Sensitivity): 3 ng/L (ref <18)
Troponin I (High Sensitivity): 4 ng/L (ref <18)

## 2022-11-11 LAB — LIPASE, BLOOD: Lipase: 33 U/L (ref 11–51)

## 2022-11-11 MED ORDER — ONDANSETRON HCL 4 MG/2ML IJ SOLN
4.0000 mg | Freq: Once | INTRAMUSCULAR | Status: AC
  Administered 2022-11-11: 4 mg via INTRAVENOUS
  Filled 2022-11-11: qty 2

## 2022-11-11 MED ORDER — ENOXAPARIN SODIUM 40 MG/0.4ML IJ SOSY
40.0000 mg | PREFILLED_SYRINGE | INTRAMUSCULAR | Status: DC
  Administered 2022-11-11 – 2022-11-20 (×10): 40 mg via SUBCUTANEOUS
  Filled 2022-11-11 (×11): qty 0.4

## 2022-11-11 MED ORDER — ONDANSETRON HCL 4 MG/2ML IJ SOLN
4.0000 mg | Freq: Once | INTRAMUSCULAR | Status: DC
  Filled 2022-11-11 (×2): qty 2

## 2022-11-11 MED ORDER — HYDROMORPHONE HCL 1 MG/ML IJ SOLN
0.5000 mg | Freq: Once | INTRAMUSCULAR | Status: AC
  Administered 2022-11-11: 0.5 mg via INTRAVENOUS
  Filled 2022-11-11: qty 1

## 2022-11-11 MED ORDER — ONDANSETRON HCL 4 MG PO TABS: 4.0000 mg | ORAL_TABLET | Freq: Four times a day (QID) | ORAL | Status: DC | PRN

## 2022-11-11 MED ORDER — DEXTROSE IN LACTATED RINGERS 5 % IV SOLN: INTRAVENOUS | Status: DC

## 2022-11-11 MED ORDER — IOHEXOL 350 MG/ML SOLN
75.0000 mL | Freq: Once | INTRAVENOUS | Status: AC | PRN
  Administered 2022-11-11: 75 mL via INTRAVENOUS

## 2022-11-11 MED ORDER — ONDANSETRON HCL 4 MG/2ML IJ SOLN
4.0000 mg | Freq: Four times a day (QID) | INTRAMUSCULAR | Status: DC | PRN
  Administered 2022-11-11 – 2022-11-14 (×6): 4 mg via INTRAVENOUS
  Filled 2022-11-11 (×6): qty 2

## 2022-11-11 MED ORDER — LACTATED RINGERS IV SOLN: INTRAVENOUS | Status: AC

## 2022-11-11 MED ORDER — SODIUM CHLORIDE 0.9 % IV BOLUS
1000.0000 mL | Freq: Once | INTRAVENOUS | Status: AC
  Administered 2022-11-11: 1000 mL via INTRAVENOUS

## 2022-11-11 MED ORDER — MORPHINE SULFATE (PF) 2 MG/ML IV SOLN
2.0000 mg | INTRAVENOUS | Status: DC | PRN
  Administered 2022-11-12 – 2022-11-18 (×18): 2 mg via INTRAVENOUS
  Filled 2022-11-11 (×21): qty 1
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
## 2022-11-11 NOTE — ED Triage Notes (Addendum)
Patient from home with upper abdominal pain that started earlier today. Patient stated this occurred just after eating lunch. She describes it as she feels like her belly is "filling up" or bloated. Per EMS the pain comes in waves and patient writhes in pain. Treated with fentanyl which helps for 15 minutes. States the pain was worse than childbirth.Patient denies n/v/d. Patient states she had back surgery for her scoliosis 3 weeks ago and has had an uncomplicated recovery so far.

## 2022-11-11 NOTE — Telephone Encounter (Signed)
[name] is calling for VO for [name] Nurse Frequency 1 time a week for 4 weeks. With 1 PRN visit.

## 2022-11-11 NOTE — ED Provider Notes (Signed)
[place] EMERGENCY DEPARTMENT AT [facility] Provider Note   CSN: [id number] Arrival date & time: 11/11/22  1457     History  Chief Complaint  Patient presents with   Abdominal Pain    Cheryl Martin is a 89 y.o. female.  89 yo F with a chief complaints of upper abdominal discomfort.  This started suddenly after she had lunch.  Has been nauseated but no vomiting.  Seems to come and go.  Has never had pain this severe before.  Denies radiation of the pain.  Denies fevers.  Prior cholecystectomy.  Denies constipation or diarrhea.  Denies urinary symptoms.   Abdominal Pain      Home Medications Prior to Admission medications   Medication Sig Start Date End Date Taking? Authorizing Provider  acetaminophen (TYLENOL) 500 MG tablet Take 1,000 mg by mouth every 6 (six) hours as needed for moderate pain or headache.    [provider]  aspirin EC (ASPIRIN LOW DOSE) 81 MG tablet Take 1 tablet (81 mg total) by mouth daily. Swallow whole. 10/18/22   Clovis Riley, PA-C  Bioflavonoid Products (VITAMIN C) CHEW Chew 1 tablet by mouth daily.    [provider]  BIOTIN PO Take 1 tablet by mouth daily.    [provider]  Cholecalciferol (VITAMIN D-3) 25 MCG (1000 UT) CAPS Take 1,000 Units by mouth daily with breakfast.    [provider]  Cyanocobalamin (B-12) 1000 MCG SUBL Place 2,000 mcg under the tongue daily.    [provider]  Melatonin 3 MG CAPS Take 3 mg by mouth at bedtime as needed (sleep).    [provider]  Multiple Vitamin (MULTIVITAMIN WITH MINERALS) TABS tablet Take 1 tablet by mouth daily.    [provider]  nitroGLYCERIN (NITROSTAT) 0.4 MG SL tablet Place 1 tablet (0.4 mg total) under the tongue every 5 (five) minutes as needed for chest pain. 10/22/18 10/02/22  Lars Masson, MD  polyethylene glycol (MIRALAX / GLYCOLAX) 17 g packet Take 17 g by mouth daily as needed for moderate  constipation.    [provider]  Sodium Chloride, Hypertonic, (MURO 128 OP) Place 1 drop into both eyes 2 (two) times daily as needed (dry eyes).    [provider]  traMADol (ULTRAM) 50 MG tablet Take 1 tablet (50 mg total) by mouth every 6 (six) hours as needed. 10/12/22 10/12/23  Clovis Riley, PA-C      Allergies    Oxybutynin, Tape, Beef-derived products, Flexeril [cyclobenzaprine], Mold extract [trichophyton], and Pollen extract    Review of Systems   Review of Systems  Gastrointestinal:  Positive for abdominal pain.    Physical Exam Updated Vital Signs BP (!) 149/85 (BP Location: Right Arm)   Pulse 61   Temp 97.7 F (36.5 C) (Oral)   Resp 18   Ht  (1.6 m)   Wt 58 kg   SpO2 95%   BMI 22.65 kg/m  Physical Exam Vitals and nursing note reviewed.  Constitutional:      General: She is not in acute distress.    Appearance: She is well-developed. She is not diaphoretic.  HENT:     Head: Normocephalic and atraumatic.  Eyes:     Pupils: Pupils are equal, round, and reactive to light.  Cardiovascular:     Rate and Rhythm: Normal rate and regular rhythm.     Heart sounds: No murmur heard.    No friction rub. No gallop.  Pulmonary:     Effort: Pulmonary effort is normal.     Breath sounds: No wheezing or rales.  Abdominal:     General: There is no distension.     Palpations: Abdomen is soft.     Tenderness: There is abdominal tenderness.     Comments: Diffuse pain on palpation to the abdomen.  She points to the epigastrium but no obvious focal tenderness on exam  Musculoskeletal:        General: No tenderness.     Cervical back: Normal range of motion and neck supple.  Skin:    General: Skin is warm and dry.  Neurological:     Mental Status: She is alert and oriented to person, place, and time.  Psychiatric:        Behavior: Behavior normal.     ED Results / Procedures / Treatments   Labs (all labs ordered are listed, but only  abnormal results are displayed) Labs Reviewed  LIPASE, BLOOD  COMPREHENSIVE METABOLIC PANEL  CBC  URINALYSIS, ROUTINE W REFLEX MICROSCOPIC  I-STAT CHEM 8, ED  TROPONIN I (HIGH SENSITIVITY)    EKG EKG Interpretation  Date/Time:  Monday November 11 2022 15:02:49 EDT Ventricular Rate:  59 PR Interval:  144 QRS Duration: 70 QT Interval:  418 QTC Calculation: 413 R Axis:   83 Text Interpretation: Sinus bradycardia Low voltage QRS Borderline ECG axis changed Otherwise no significant change Confirmed by Melene Plan [phone]) on 11/11/2022 3:22:45 PM  Radiology No results found.  Procedures Procedures    Medications Ordered in ED Medications  sodium chloride 0.9 % bolus 1,000 mL (has no administration in time range)  HYDROmorphone (DILAUDID) injection 0.5 mg (has no administration in time range)  ondansetron (ZOFRAN) injection 4 mg (has no administration in time range)    ED Course/ Medical Decision Making/ A&P                             Medical Decision Making Amount and/or Complexity of Data Reviewed Labs: ordered. Radiology: ordered.  Risk Prescription drug management.   89 yo F with a chief complaint of upper abdominal pain.  Started shortly after eating lunch.  Described as sharp and severe.  No radiation.  Nausea no vomiting.  Prior cholecystectomy.  Will treat pain and nausea.  Blood work.  CT abdomen pelvis.  Reassess.  Labs without leukocytosis, no anemia, LFTs and lipase are unremarkable.  CT scan of the abdomen pelvis is concerning for a partial small bowel obstruction.  Radiology read with concern for possible right inguinal hernia.  Patient with no history of hernias no obvious pain in that area.  I do not appreciate any obvious mass or bulge.  I discussed this with Dr. Janee Morn, general surgery.  Recommended medical admission and they will formally consult.   The patients results and plan were reviewed and discussed.   Any x-rays performed were independently  reviewed by myself.   Differential diagnosis were considered with the presenting HPI.  Medications  HYDROmorphone (DILAUDID) injection 0.5 mg (has no administration in time range)  ondansetron (ZOFRAN) injection 4 mg (4 mg Intravenous Not Given 11/11/22 1907)  lactated ringers infusion (has no administration in time range)  sodium chloride 0.9 % bolus 1,000 mL (1,000 mLs Intravenous New Bag/Given 11/11/22 1606)  HYDROmorphone (DILAUDID) injection 0.5 mg (0.5 mg Intravenous Given 11/11/22 1607)  ondansetron (ZOFRAN) injection 4 mg (4 mg Intravenous Given 11/11/22 1604)  HYDROmorphone (DILAUDID)  injection 0.5 mg (0.5 mg Intravenous Given 11/11/22 1815)  ondansetron (ZOFRAN) injection 4 mg (4 mg Intravenous Given 11/11/22 1814)  iohexol (OMNIPAQUE) 350 MG/ML injection 75 mL (75 mLs Intravenous Contrast Given 11/11/22 1754)    Vitals:   11/11/22 1545 11/11/22 1715 11/11/22 1815 11/11/22 1830  BP: 131/84 122/61 138/68 137/73  Pulse: 61 (!) 56  73  Resp: 14 10 17 13   Temp:      TempSrc:      SpO2: 96% 98%  94%  Weight:      Height:        Final diagnoses:  None    Admission/ observation were discussed with the admitting physician, patient and/or family and they are comfortable with the plan.            Final Clinical Impression(s) / ED Diagnoses Final diagnoses:  None    Rx / DC Orders ED Discharge Orders     None         Melene Plan, DO 11/11/22 1933
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
## 2022-11-12 ENCOUNTER — Inpatient Hospital Stay (HOSPITAL_COMMUNITY): Payer: Medicare HMO

## 2022-11-12 DIAGNOSIS — K56609 Unspecified intestinal obstruction, unspecified as to partial versus complete obstruction: Secondary | ICD-10-CM | POA: Diagnosis not present

## 2022-11-12 LAB — COMPREHENSIVE METABOLIC PANEL
ALT: 16 U/L (ref 0–44)
AST: 18 U/L (ref 15–41)
Albumin: 3.3 g/dL — ABNORMAL LOW (ref 3.5–5.0)
Alkaline Phosphatase: 84 U/L (ref 38–126)
Anion gap: 8 (ref 5–15)
BUN: 8 mg/dL (ref 8–23)
CO2: 25 mmol/L (ref 22–32)
Calcium: 9.2 mg/dL (ref 8.9–10.3)
Chloride: 99 mmol/L (ref 98–111)
Creatinine, Ser: 0.76 mg/dL (ref 0.44–1.00)
GFR, Estimated: >60 mL/min (ref >60)
Glucose, Bld: 145 mg/dL — ABNORMAL HIGH (ref 70–99)
Potassium: 3.7 mmol/L (ref 3.5–5.1)
Sodium: 132 mmol/L — ABNORMAL LOW (ref 135–145)
Total Bilirubin: 0.4 mg/dL (ref 0.3–1.2)
Total Protein: 5.4 g/dL — ABNORMAL LOW (ref 6.5–8.1)

## 2022-11-12 LAB — GLUCOSE, CAPILLARY
Glucose-Capillary: 124 mg/dL — ABNORMAL HIGH (ref 70–99)
Glucose-Capillary: 117 mg/dL — ABNORMAL HIGH (ref 70–99)

## 2022-11-12 LAB — CBC
HCT: 37.9 % (ref 36.0–46.0)
Hemoglobin: 12.6 g/dL (ref 12.0–15.0)
MCH: 31.6 pg (ref 26.0–34.0)
MCHC: 33.2 g/dL (ref 30.0–36.0)
MCV: 95 fL (ref 80.0–100.0)
Platelets: 224 10*3/uL (ref 150–400)
RBC: 3.99 MIL/uL (ref 3.87–5.11)
RDW: 12.1 % (ref 11.5–15.5)
WBC: 8.6 10*3/uL (ref 4.0–10.5)
nRBC: 0 % (ref 0.0–0.2)

## 2022-11-12 LAB — CBG MONITORING, ED: Glucose-Capillary: 156 mg/dL — ABNORMAL HIGH (ref 70–99)

## 2022-11-12 MED ORDER — ALUM & MAG HYDROXIDE-SIMETH 200-200-20 MG/5ML PO SUSP
15.0000 mL | Freq: Once | ORAL | Status: AC
  Administered 2022-11-12: 15 mL via ORAL
  Filled 2022-11-12: qty 30

## 2022-11-12 MED ORDER — IPRATROPIUM-ALBUTEROL 0.5-2.5 (3) MG/3ML IN SOLN: 3.0000 mL | RESPIRATORY_TRACT | Status: DC | PRN

## 2022-11-12 MED ORDER — ACETAMINOPHEN 325 MG PO TABS: 650.0000 mg | ORAL_TABLET | Freq: Four times a day (QID) | ORAL | Status: DC | PRN

## 2022-11-12 MED ORDER — MENTHOL 3 MG MT LOZG: 1.0000 | LOZENGE | OROMUCOSAL | Status: DC | PRN

## 2022-11-12 MED ORDER — GUAIFENESIN 100 MG/5ML PO LIQD
5.0000 mL | ORAL | Status: DC | PRN
  Filled 2022-11-12: qty 5

## 2022-11-12 MED ORDER — DIATRIZOATE MEGLUMINE & SODIUM 66-10 % PO SOLN
90.0000 mL | Freq: Once | ORAL | Status: AC
  Administered 2022-11-12: 90 mL via NASOGASTRIC
  Filled 2022-11-12: qty 90

## 2022-11-12 MED ORDER — TRAZODONE HCL 50 MG PO TABS: 50.0000 mg | ORAL_TABLET | Freq: Every evening | ORAL | Status: DC | PRN

## 2022-11-12 MED ORDER — METOPROLOL TARTRATE 5 MG/5ML IV SOLN: 5.0000 mg | INTRAVENOUS | Status: DC | PRN

## 2022-11-12 MED ORDER — SENNOSIDES-DOCUSATE SODIUM 8.6-50 MG PO TABS: 1.0000 | ORAL_TABLET | Freq: Every evening | ORAL | Status: DC | PRN

## 2022-11-12 MED ORDER — HYDRALAZINE HCL 20 MG/ML IJ SOLN: 10.0000 mg | INTRAMUSCULAR | Status: DC | PRN

## 2022-11-12 NOTE — ED Notes (Addendum)
Pt alert, NAD, calm, interactive, resps e/u, speaking in clear statements. Denies nausea, sob or gagging at this time. Endorses some pain. NGT to low int. wall suction. VSS. HOB 30 degrees.

## 2022-11-12 NOTE — ED Notes (Signed)
Gastrograffin given, NGT clamped, HOB 30 degrees, denies nausea, tolerating NGT, xray notified, pending xrays at 1600.

## 2022-11-12 NOTE — Progress Notes (Signed)
PROGRESS NOTE    Cheryl Martin  ZOX:[id number] DOB: [birth year] DOA: 11/11/2022 PCP: [name], PA   Brief Narrative:   89 y.o. female with medical history significant of vitamin D deficiency, ADD, hyperlipidemia, urinary incontinence, pulmonary nodules and scoliosis of the spine who presented to the ER with abdominal pain that started today.  In ER CT scan showed partial small bowel obstruction and right inguinal hernia.  General surgery team was consulted.  Assessment & Plan:  Principal Problem:   Small bowel obstruction Active Problems:   ADD (attention deficit disorder)   Hyperlipidemia    Partial small bowel obstruction - Conservative management.  General surgery team following.  IV fluids, advance diet as tolerated  History of scoliosis with recent back surgery Still recovering.  History of TIA - Home meds currently on hold  Hyperlipidemia - Statin on hold  Osteoarthritis - Pain control       DVT prophylaxis: Lovenox Code Status: DNR (Per ACP Documents) Family Communication:  Called Okey Regal Status is: Inpatient Management of SBO per Gen Sx       Diet Orders (From admission, onward)     Start     Ordered   11/11/22 2033  Diet NPO time specified  Diet effective now        11/11/22 2032            Subjective:  Still has abdominal discomfort but slightly improved compared to yesterday. Denies having bowel movement or passing any gas so far.  Examination:  General exam: Appears calm and comfortable  Respiratory system: Clear to auscultation. Respiratory effort normal. Cardiovascular system: S1 & S2 heard, RRR. No JVD, murmurs, rubs, gallops or clicks. No pedal edema. Gastrointestinal system: Abdomen is slightly deep palpation.  Diminished bowel sounds Central nervous system: Alert and oriented. No focal neurological deficits. Extremities: Symmetric 5 x 5 power. Skin: No rashes, lesions or ulcers Psychiatry: Judgement and insight  appear normal. Mood & affect appropriate.  Objective: Vitals:   11/12/22 0615 11/12/22 0730 11/12/22 0800 11/12/22 0814  BP: (!) 122/56 (!) 129/58 (!) 129/55   Pulse: 71 69 71   Resp: Temp:    98.1 F (36.7 C)  TempSrc:    Oral  SpO2: 95% 97% 99%   Weight:      Height:        Intake/Output Summary (Last 24 hours) at 11/12/2022 0857 Last data filed at 11/12/2022 0836 Gross per 24 hour  Intake 1000 ml  Output --  Net 1000 ml   Filed Weights   11/11/22 1508  Weight: 58 kg    Scheduled Meds:  enoxaparin (LOVENOX) injection  40 mg Subcutaneous Q24H   ondansetron (ZOFRAN) IV  4 mg Intravenous Once   Continuous Infusions:  dextrose 5% lactated ringers 100 mL/hr at 11/12/22 0850    Nutritional status     Body mass index is 22.65 kg/m.  Data Reviewed:   CBC: Recent Labs  Lab 11/11/22 1510 11/11/22 1614 11/12/22 0325  WBC 5.3  --  8.6  HGB 12.5 11.2* 12.6  HCT 36.7 33.0* 37.9  MCV 92.9  --  95.0  PLT 226  --  224   Basic Metabolic Panel: Recent Labs  Lab 11/11/22 1510 11/11/22 1614 11/12/22 0325  NA 133* 134* 132*  K 4.1 4.0 3.7  CL 98 100 99  CO2 27  --  25  GLUCOSE 100* 100* 145*  BUN CREATININE 0.71 0.60  0.76  CALCIUM 9.2  --  9.2   GFR: Estimated Creatinine Clearance: 39.4 mL/min (by C-G formula based on SCr of 0.76 mg/dL). Liver Function Tests: Recent Labs  Lab 11/11/22 1510 11/12/22 0325  AST 24 18  ALT 15 16  ALKPHOS 90 84  BILITOT 0.8 0.4  PROT 5.9* 5.4*  ALBUMIN 3.6 3.3*   Recent Labs  Lab 11/11/22 1510  LIPASE 33   No results for input(s): "AMMONIA" in the last 168 hours. Coagulation Profile: No results for input(s): "INR", "PROTIME" in the last 168 hours. Cardiac Enzymes: No results for input(s): "CKTOTAL", "CKMB", "CKMBINDEX", "TROPONINI" in the last 168 hours. BNP (last 3 results) No results for input(s): "PROBNP" in the last 8760 hours. HbA1C: No results for input(s): "HGBA1C" in the last 72  hours. CBG: No results for input(s): "GLUCAP" in the last 168 hours. Lipid Profile: No results for input(s): "CHOL", "HDL", "LDLCALC", "TRIG", "CHOLHDL", "LDLDIRECT" in the last 72 hours. Thyroid Function Tests: No results for input(s): "TSH", "T4TOTAL", "FREET4", "T3FREE", "THYROIDAB" in the last 72 hours. Anemia Panel: No results for input(s): "VITAMINB12", "FOLATE", "FERRITIN", "TIBC", "IRON", "RETICCTPCT" in the last 72 hours. Sepsis Labs: No results for input(s): "PROCALCITON", "LATICACIDVEN" in the last 168 hours.  No results found for this or any previous visit (from the past 240 hour(s)).       Radiology Studies: DG Abd Portable 1 View  Result Date: 11/12/2022 CLINICAL DATA:  Nasogastric tube placement EXAM: PORTABLE ABDOMEN - 1 VIEW COMPARISON:  None Available. FINDINGS: Nasogastric tube tip overlies the proximal to mid body of the stomach. Moderate hiatal hernia. Visualized lung bases are clear. Implanted loop recorder noted. Cholecystectomy clips seen in the right upper quadrant. Moderate lumbar levoscoliosis. IMPRESSION: 1. Nasogastric tube tip within the proximal to mid body of the stomach. Electronically Signed   By: Helyn Numbers M.D.   On: 11/12/2022 03:24   CT ABDOMEN PELVIS W CONTRAST  Result Date: 11/11/2022 CLINICAL DATA:  Acute nonlocalized abdominal pain EXAM: CT ABDOMEN AND PELVIS WITH CONTRAST TECHNIQUE: Multidetector CT imaging of the abdomen and pelvis was performed using the standard protocol following bolus administration of intravenous contrast. RADIATION DOSE REDUCTION: This exam was performed according to the departmental dose-optimization program which includes automated exposure control, adjustment of the mA and/or kV according to patient size and/or use of iterative reconstruction technique. CONTRAST:  75mL OMNIPAQUE IOHEXOL 350 MG/ML SOLN COMPARISON:  10/23/2020 FINDINGS: Lower chest: Mild dependent atelectasis in the lung bases. Large esophageal hiatal  hernia. Hepatobiliary: Surgical absence of the gallbladder with bile duct dilatation, likely postoperative. No change since prior study. No focal liver lesions. Pancreas: Unremarkable. No pancreatic ductal dilatation or surrounding inflammatory changes. Spleen: Normal in size without focal abnormality. Adrenals/Urinary Tract: Adrenal glands are unremarkable. Kidneys are normal, without renal calculi, focal lesion, or hydronephrosis. Bladder is unremarkable. Stomach/Bowel: Stomach is fluid-filled without wall thickening. Pelvic small bowel loops are mildly dilated and fluid-filled without wall thickening. This is developing since prior study and could indicate localized ileus, enteritis, or early or partial obstruction. Terminal ileum is decompressed. There is a small right inguinal hernia containing small bowel which could be the cause of obstruction. Colon is decompressed. Colonic diverticula without evidence of acute diverticulitis. Vascular/Lymphatic: Prominent aortic calcification. No aneurysm or dissection. No significant lymphadenopathy. Reproductive: No pelvic masses. Other: No free air or free fluid in the abdomen. Abdominal wall musculature appears intact. Musculoskeletal: Degenerative changes in the spine. Lumbar scoliosis convex to the left. Mild anterior subluxation of  L4 on L5. Postoperative changes with apparent posterior hemilaminectomy at L4-5. laminectomy appears to have been performed since the previous study. Fluid tracks from the post laminectomy space into the soft tissues dorsal to the spine, likely representing postoperative collection. Injection granulomas in the soft tissues. IMPRESSION: 1. Dilated fluid-filled pelvic small bowel suggesting early or partial obstruction, possibly due to a right inguinal hernia. Changes could also indicate enteritis or ileus. 2. Apparent interval postoperative laminectomy at L4-5 with overlying soft tissue collection likely representing a postoperative  collection. 3. Large esophageal hiatal hernia. 4. Prominent aortic atherosclerosis. Electronically Signed   By: Burman Nieves M.D.   On: 11/11/2022 18:10           LOS: 1 day   Time spent= 35 mins    Chinwe Lope Joline Maxcy, MD [facility]  If 7PM-7AM, please contact night-coverage  11/12/2022, 8:57 AM

## 2022-11-12 NOTE — ED Notes (Addendum)
Pt reporting increasing abdominal pressure, nausea and began to have intermittent episodes of vomiting. RN notified Dr. Janee Morn of worsening symptoms and order for NG tube placement was received and 18G NG tube was placed without issue. Verification imaging in process.

## 2022-11-12 NOTE — ED Notes (Signed)
Up to [facility], void only, back to stretcher, no changes, HOB 45 degrees.
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
## 2022-11-12 NOTE — Progress Notes (Addendum)
[facility] Progress Note     Subjective: CC:  Denies abdominal pain at rest. Denies flatus or BM. NGT currently clamped, received gastrografin 90 mins ago.   Objective: Vital signs in last 24 hours: Temp:  [97.5 F (36.4 C)-98.3 F (36.8 C)] 98.1 F (36.7 C) (04/23 0814) Pulse Rate:  [56-73] 71 (04/23 0930) Resp:  [9-26] 13 (04/23 0930) BP: (112-154)/(49-108) 125/57 (04/23 0930) SpO2:  [94 %-99 %] 98 % (04/23 0930) Weight:  [58 kg] 58 kg (04/22 1508)    Intake/Output from previous day: No intake/output data recorded. Intake/Output this shift: Total I/O In: 1000 [I.V.:1000] Out: -   PE: Gen:  Alert, NAD, pleasant Card:  Regular rate and rhythm Pulm:  Normal effort ORA Abd: Soft, mild distention, TTP periumbilical region and LLQ with mild guarding, no rebound tenderness or peritonitis, no hernias.  NG in place, clamped, cannister with ~420mL brown effluent Skin: warm and dry, no rashes  Psych: A&Ox3   Lab Results:  Recent Labs    11/11/22 1510 11/11/22 1614 11/12/22 0325  WBC 5.3  --  8.6  HGB 12.5 11.2* 12.6  HCT 36.7 33.0* 37.9  PLT 226  --  224   BMET Recent Labs    11/11/22 1510 11/11/22 1614 11/12/22 0325  NA 133* 134* 132*  K 4.1 4.0 3.7  CL 98 100 99  CO2 27  --  25  GLUCOSE 100* 100* 145*  BUN CREATININE 0.71 0.60 0.76  CALCIUM 9.2  --  9.2   PT/INR No results for input(s): "LABPROT", "INR" in the last 72 hours. CMP     Component Value Date/Time   NA 132 (L) 11/12/2022 0325   NA 139 10/25/2022 1020   K 3.7 11/12/2022 0325   CL 99 11/12/2022 0325   CO2 25 11/12/2022 0325   GLUCOSE 145 (H) 11/12/2022 0325   BUN 8 11/12/2022 0325   BUN 17 10/25/2022 1020   CREATININE 0.76 11/12/2022 0325   CREATININE 0.84 03/14/2016 1020   CALCIUM 9.2 11/12/2022 0325   PROT 5.4 (L) 11/12/2022 0325   PROT 6.1 10/25/2022 1020   ALBUMIN 3.3 (L) 11/12/2022 0325   ALBUMIN 4.2 10/25/2022 1020   AST 18 11/12/2022 0325   ALT 16  11/12/2022 0325   ALKPHOS 84 11/12/2022 0325   BILITOT 0.4 11/12/2022 0325   BILITOT 0.3 10/25/2022 1020   GFRNONAA >60 11/12/2022 0325   GFRNONAA 65 03/14/2016 1020   GFRAA 86 07/27/2020 0930   GFRAA 75 03/14/2016 1020   Lipase     Component Value Date/Time   LIPASE 33 11/11/2022 1510       Studies/Results: DG Abd Portable 1 View  Result Date: 11/12/2022 CLINICAL DATA:  Nasogastric tube placement EXAM: PORTABLE ABDOMEN - 1 VIEW COMPARISON:  None Available. FINDINGS: Nasogastric tube tip overlies the proximal to mid body of the stomach. Moderate hiatal hernia. Visualized lung bases are clear. Implanted loop recorder noted. Cholecystectomy clips seen in the right upper quadrant. Moderate lumbar levoscoliosis. IMPRESSION: 1. Nasogastric tube tip within the proximal to mid body of the stomach. Electronically Signed   By: Helyn Numbers M.D.   On: 11/12/2022 03:24   CT ABDOMEN PELVIS W CONTRAST  Result Date: 11/11/2022 CLINICAL DATA:  Acute nonlocalized abdominal pain EXAM: CT ABDOMEN AND PELVIS WITH CONTRAST TECHNIQUE: Multidetector CT imaging of the abdomen and pelvis was performed using the standard protocol following bolus administration of intravenous contrast. RADIATION DOSE REDUCTION: This exam was performed  according to the departmental dose-optimization program which includes automated exposure control, adjustment of the mA and/or kV according to patient size and/or use of iterative reconstruction technique. CONTRAST:  75mL OMNIPAQUE IOHEXOL 350 MG/ML SOLN COMPARISON:  10/23/2020 FINDINGS: Lower chest: Mild dependent atelectasis in the lung bases. Large esophageal hiatal hernia. Hepatobiliary: Surgical absence of the gallbladder with bile duct dilatation, likely postoperative. No change since prior study. No focal liver lesions. Pancreas: Unremarkable. No pancreatic ductal dilatation or surrounding inflammatory changes. Spleen: Normal in size without focal abnormality. Adrenals/Urinary  Tract: Adrenal glands are unremarkable. Kidneys are normal, without renal calculi, focal lesion, or hydronephrosis. Bladder is unremarkable. Stomach/Bowel: Stomach is fluid-filled without wall thickening. Pelvic small bowel loops are mildly dilated and fluid-filled without wall thickening. This is developing since prior study and could indicate localized ileus, enteritis, or early or partial obstruction. Terminal ileum is decompressed. There is a small right inguinal hernia containing small bowel which could be the cause of obstruction. Colon is decompressed. Colonic diverticula without evidence of acute diverticulitis. Vascular/Lymphatic: Prominent aortic calcification. No aneurysm or dissection. No significant lymphadenopathy. Reproductive: No pelvic masses. Other: No free air or free fluid in the abdomen. Abdominal wall musculature appears intact. Musculoskeletal: Degenerative changes in the spine. Lumbar scoliosis convex to the left. Mild anterior subluxation of L4 on L5. Postoperative changes with apparent posterior hemilaminectomy at L4-5. laminectomy appears to have been performed since the previous study. Fluid tracks from the post laminectomy space into the soft tissues dorsal to the spine, likely representing postoperative collection. Injection granulomas in the soft tissues. IMPRESSION: 1. Dilated fluid-filled pelvic small bowel suggesting early or partial obstruction, possibly due to a right inguinal hernia. Changes could also indicate enteritis or ileus. 2. Apparent interval postoperative laminectomy at L4-5 with overlying soft tissue collection likely representing a postoperative collection. 3. Large esophageal hiatal hernia. 4. Prominent aortic atherosclerosis. Electronically Signed   By: Burman Nieves M.D.   On: 11/11/2022 18:10    Anti-infectives: Anti-infectives (From admission, onward)    None        Assessment/Plan  SBO, secondary to adhesions vs RIH that is now reduced.  - AFVSS,  WBC 8.6 - continue NG to LIWS, SBO protocol in process and will follow abdominal films - OOB/mobilize - I will order chloraseptic for her complaint of throat discomfort.    LOS: 1 day   I reviewed nursing notes, hospitalist notes, last 24 h vitals and pain scores, last 48 h intake and output, last 24 h labs and trends, and last 24 h imaging results.  This care required moderate level of medical decision making.   Hosie Spangle, PA-C [facility] Please see Amion for pager number during day hours 7:00am-4:30pm

## 2022-11-13 ENCOUNTER — Inpatient Hospital Stay (HOSPITAL_COMMUNITY): Payer: Medicare HMO

## 2022-11-13 DIAGNOSIS — K56609 Unspecified intestinal obstruction, unspecified as to partial versus complete obstruction: Secondary | ICD-10-CM | POA: Diagnosis not present

## 2022-11-13 LAB — BASIC METABOLIC PANEL
Anion gap: 6 (ref 5–15)
BUN: 9 mg/dL (ref 8–23)
CO2: 29 mmol/L (ref 22–32)
Calcium: 9 mg/dL (ref 8.9–10.3)
Chloride: 103 mmol/L (ref 98–111)
Creatinine, Ser: 0.83 mg/dL (ref 0.44–1.00)
GFR, Estimated: >60 mL/min (ref >60)
Glucose, Bld: 113 mg/dL — ABNORMAL HIGH (ref 70–99)
Potassium: 3.7 mmol/L (ref 3.5–5.1)
Sodium: 138 mmol/L (ref 135–145)

## 2022-11-13 LAB — CBC
HCT: 35.6 % — ABNORMAL LOW (ref 36.0–46.0)
Hemoglobin: 11.7 g/dL — ABNORMAL LOW (ref 12.0–15.0)
MCH: 31.1 pg (ref 26.0–34.0)
MCHC: 32.9 g/dL (ref 30.0–36.0)
MCV: 94.7 fL (ref 80.0–100.0)
Platelets: 196 10*3/uL (ref 150–400)
RBC: 3.76 MIL/uL — ABNORMAL LOW (ref 3.87–5.11)
RDW: 12.1 % (ref 11.5–15.5)
WBC: 7.1 10*3/uL (ref 4.0–10.5)
nRBC: 0 % (ref 0.0–0.2)

## 2022-11-13 LAB — MAGNESIUM: Magnesium: 1.9 mg/dL (ref 1.7–2.4)

## 2022-11-13 LAB — GLUCOSE, CAPILLARY
Glucose-Capillary: 108 mg/dL — ABNORMAL HIGH (ref 70–99)
Glucose-Capillary: 128 mg/dL — ABNORMAL HIGH (ref 70–99)
Glucose-Capillary: 139 mg/dL — ABNORMAL HIGH (ref 70–99)

## 2022-11-13 MED ORDER — DIATRIZOATE MEGLUMINE & SODIUM 66-10 % PO SOLN
90.0000 mL | Freq: Once | ORAL | Status: AC
  Administered 2022-11-13: 90 mL via NASOGASTRIC
  Filled 2022-11-13: qty 90

## 2022-11-13 MED ORDER — CLONIDINE HCL 0.1 MG/24HR TD PTWK
0.1000 mg | MEDICATED_PATCH | TRANSDERMAL | Status: DC
  Administered 2022-11-13 – 2022-11-20 (×2): 0.1 mg via TRANSDERMAL
  Filled 2022-11-13 (×2): qty 1

## 2022-11-13 NOTE — Progress Notes (Signed)
[facility] Progress Note     Subjective: CC:  States she pulled out her NGT overnight - she was dreaming and pulled it out. Replaced early this morning. Denies flatus or BM.  Has a daughter who lives in the area.   Objective: Vital signs in last 24 hours: Temp:  [97.6 F (36.4 C)-98.4 F (36.9 C)] 98.4 F (36.9 C) (04/24 0810) Pulse Rate:  [62-74] 67 (04/24 0810) Resp:  [12-20] 18 (04/24 0810) BP: (120-147)/(54-58) 147/55 (04/24 0810) SpO2:  [95 %-99 %] 95 % (04/24 0810) Weight:  [61.2 kg] 61.2 kg (04/23 1305) Last BM Date : 11/11/22  Intake/Output from previous day: 04/23 0701 - 04/24 0700 In: 1816.7 [I.V.:1816.7] Out: 1075 [Emesis/NG output:275] Intake/Output this shift: No intake/output data recorded.  PE: Gen:  Alert, NAD, pleasant Card:  Regular rate and rhythm Pulm:  Normal effort ORA Abd: Soft, mild distention, minimally tender, no rebound tenderness or peritonitis, no palpable hernias.  NG in place, clamped, cannister with ~169mL bilious effluent Skin: warm and dry, no rashes  Psych: A&Ox3   Lab Results:  Recent Labs    11/12/22 0325 11/13/22 0436  WBC 8.6 7.1  HGB 12.6 11.7*  HCT 37.9 35.6*  PLT 224 196   BMET Recent Labs    11/12/22 0325 11/13/22 0436  NA 132* 138  K 3.7 3.7  CL 99 103  CO2 25 29  GLUCOSE 145* 113*  BUN 8 9  CREATININE 0.76 0.83  CALCIUM 9.2 9.0   PT/INR No results for input(s): "LABPROT", "INR" in the last 72 hours. CMP     Component Value Date/Time   NA 138 11/13/2022 0436   NA 139 10/25/2022 1020   K 3.7 11/13/2022 0436   CL 103 11/13/2022 0436   CO2 29 11/13/2022 0436   GLUCOSE 113 (H) 11/13/2022 0436   BUN 9 11/13/2022 0436   BUN 17 10/25/2022 1020   CREATININE 0.83 11/13/2022 0436   CREATININE 0.84 03/14/2016 1020   CALCIUM 9.0 11/13/2022 0436   PROT 5.4 (L) 11/12/2022 0325   PROT 6.1 10/25/2022 1020   ALBUMIN 3.3 (L) 11/12/2022 0325   ALBUMIN 4.2 10/25/2022 1020   AST 18 11/12/2022 0325    ALT 16 11/12/2022 0325   ALKPHOS 84 11/12/2022 0325   BILITOT 0.4 11/12/2022 0325   BILITOT 0.3 10/25/2022 1020   GFRNONAA >60 11/13/2022 0436   GFRNONAA 65 03/14/2016 1020   GFRAA 86 07/27/2020 0930   GFRAA 75 03/14/2016 1020   Lipase     Component Value Date/Time   LIPASE 33 11/11/2022 1510       Studies/Results: DG Abd 1 View  Result Date: 11/13/2022 CLINICAL DATA:  Small-bowel obstruction EXAM: ABDOMEN - 1 VIEW COMPARISON:  Portable exam 0645 hours compared to 11/12/2022 FINDINGS: Surgical clips RIGHT upper quadrant consistent with cholecystectomy. Tip of nasogastric tube projects over proximal stomach. Dilated air-filled small bowel loops in the LEFT upper quadrant and LEFT mid abdomen consistent with small bowel obstruction. No bowel wall thickening. Small amount of scattered colonic gas and stool. Osseous demineralization with levoconvex thoracolumbar scoliosis. Atherosclerotic calcification aorta. IMPRESSION: Persistent small bowel obstruction. Electronically Signed   By: Ulyses Southward M.D.   On: 11/13/2022 08:45   DG Abd Portable 1V-Small Bowel Obstruction Protocol-initial, 8 hr delay  Result Date: 11/12/2022 CLINICAL DATA:  Evaluate NG placement EXAM: PORTABLE ABDOMEN - 1 VIEW COMPARISON:  Earlier today. FINDINGS: The enteric tube tip and side port are below the level of the GE  junction. The tip of the tube is in the expected location of the gastric fundus. Paucity bowel gas limits assessment for degree of bowel dilatation. Surgical clips are again seen within the right hemiabdomen. Scoliosis deformity in lumbar degenerative disc disease. IMPRESSION: Tip and side port of enteric tube are below the level of the GE junction. Tip of the NG tube is in the gastric fundus. Electronically Signed   By: Signa Kell M.D.   On: 11/12/2022 16:21   DG Abd Portable 1 View  Result Date: 11/12/2022 CLINICAL DATA:  Nasogastric tube placement EXAM: PORTABLE ABDOMEN - 1 VIEW COMPARISON:  None  Available. FINDINGS: Nasogastric tube tip overlies the proximal to mid body of the stomach. Moderate hiatal hernia. Visualized lung bases are clear. Implanted loop recorder noted. Cholecystectomy clips seen in the right upper quadrant. Moderate lumbar levoscoliosis. IMPRESSION: 1. Nasogastric tube tip within the proximal to mid body of the stomach. Electronically Signed   By: Helyn Numbers M.D.   On: 11/12/2022 03:24   CT ABDOMEN PELVIS W CONTRAST  Result Date: 11/11/2022 CLINICAL DATA:  Acute nonlocalized abdominal pain EXAM: CT ABDOMEN AND PELVIS WITH CONTRAST TECHNIQUE: Multidetector CT imaging of the abdomen and pelvis was performed using the standard protocol following bolus administration of intravenous contrast. RADIATION DOSE REDUCTION: This exam was performed according to the departmental dose-optimization program which includes automated exposure control, adjustment of the mA and/or kV according to patient size and/or use of iterative reconstruction technique. CONTRAST:  75mL OMNIPAQUE IOHEXOL 350 MG/ML SOLN COMPARISON:  10/23/2020 FINDINGS: Lower chest: Mild dependent atelectasis in the lung bases. Large esophageal hiatal hernia. Hepatobiliary: Surgical absence of the gallbladder with bile duct dilatation, likely postoperative. No change since prior study. No focal liver lesions. Pancreas: Unremarkable. No pancreatic ductal dilatation or surrounding inflammatory changes. Spleen: Normal in size without focal abnormality. Adrenals/Urinary Tract: Adrenal glands are unremarkable. Kidneys are normal, without renal calculi, focal lesion, or hydronephrosis. Bladder is unremarkable. Stomach/Bowel: Stomach is fluid-filled without wall thickening. Pelvic small bowel loops are mildly dilated and fluid-filled without wall thickening. This is developing since prior study and could indicate localized ileus, enteritis, or early or partial obstruction. Terminal ileum is decompressed. There is a small right inguinal  hernia containing small bowel which could be the cause of obstruction. Colon is decompressed. Colonic diverticula without evidence of acute diverticulitis. Vascular/Lymphatic: Prominent aortic calcification. No aneurysm or dissection. No significant lymphadenopathy. Reproductive: No pelvic masses. Other: No free air or free fluid in the abdomen. Abdominal wall musculature appears intact. Musculoskeletal: Degenerative changes in the spine. Lumbar scoliosis convex to the left. Mild anterior subluxation of L4 on L5. Postoperative changes with apparent posterior hemilaminectomy at L4-5. laminectomy appears to have been performed since the previous study. Fluid tracks from the post laminectomy space into the soft tissues dorsal to the spine, likely representing postoperative collection. Injection granulomas in the soft tissues. IMPRESSION: 1. Dilated fluid-filled pelvic small bowel suggesting early or partial obstruction, possibly due to a right inguinal hernia. Changes could also indicate enteritis or ileus. 2. Apparent interval postoperative laminectomy at L4-5 with overlying soft tissue collection likely representing a postoperative collection. 3. Large esophageal hiatal hernia. 4. Prominent aortic atherosclerosis. Electronically Signed   By: Burman Nieves M.D.   On: 11/11/2022 18:10    Anti-infectives: Anti-infectives (From admission, onward)    None        Assessment/Plan  SBO, secondary to adhesions vs RIH that is now reduced.  - AFVSS, WBC 7.1 - overall benign  exam. She is distended but minimally tender, no palpable right inguinal hernia. - continue NG to LIWS. I do not see any enteral contrast on KUB. Repeat SBO protocol today and await bowel function, no emergent surgical needs.  - OOB/mobilize  FEN: NPO, NG to LIWS, ok for ice chips ID: none VTE SCD's, lovenox Foley: none    LOS: 2 days   I reviewed nursing notes, hospitalist notes, last 24 h vitals and pain scores, last 48 h intake  and output, last 24 h labs and trends, and last 24 h imaging results.  This care required moderate level of medical decision making.   Hosie Spangle, PA-C [facility] Please see Amion for pager number during day hours 7:00am-4:30pm

## 2022-11-13 NOTE — Progress Notes (Signed)
Patient pulled out her NG tube in her sleep. New NG tube was placed. Waiting for X-ray to verify placement.

## 2022-11-13 NOTE — Progress Notes (Signed)
PROGRESS NOTE   Cheryl Martin  ZOX:[id number] DOB: December 23, 1930 DOA: 11/11/2022 PCP: [name], PA  Brief Narrative:   89 year old white female recent L3-L4, L4-L5 hemilaminectomy with foraminotomies status post surgery Dr. [name] Presumed TIA 11/2021 Hypovolemic hyponatremia HLD ?  Urge incontinence followed by urology in [facility] Prior cholecystectomy/hysterectomy-present emergency room/20 08/2022 abdominal pain, N, V, Rx fentanyl- CT scan ABD pelvis = early SBO + dilated small bowel loops pelvis + right sided inguinal hernia  4/22 General Surgery consulted-Gastrografin protocol  Hospital-Problem based course  Partial SBO Gastrografin protocol unsuccessful-repetition today by general surgery-defer diet to them in addition to further management, x-ray etc. etc.  Previous TIA 11/2021 Hold all oral meds-consider rectal aspirin in the next 24 hours if not taking p.o.  Hypovolemic hyponatremia Resolved, because is n.p.o. will continue D5 LR 100 cc/H  HTN, uncontrolled On as needed metoprolol and hydralazine IV Add clonidine patch 0.1   DVT prophylaxis: Lovenox Code Status: DNR confirmed Family Communication: None present Disposition:  Status is: Inpatient Remains inpatient appropriate because:   Requires resolution of SBO     Subjective: Awake coherent 4/10 pain in abdomen - Flatus, - stool This morning had severe pain with reinsertion of NGT however this is now resolved after pain meds  Objective: Vitals:   11/12/22 1305 11/12/22 1320 11/12/22 2107 11/13/22 0520  BP:  (!) 126/57 (!) 126/58 (!) 123/57  Pulse:  62 74 65  Resp:  16 15 15   Temp:  98 F (36.7 C) 98.4 F (36.9 C) 97.6 F (36.4 C)  TempSrc:  Oral Oral Oral  SpO2:  99% 96% 96%  Weight: 61.2 kg     Height: 5\' 3"  (1.6 m)       Intake/Output Summary (Last 24 hours) at 11/13/2022 8119 Last data filed at 11/12/2022 1758 Gross per 24 hour  Intake 1816.67 ml  Output 1075 ml  Net 741.67 ml    Filed Weights   11/11/22 1508 11/12/22 1305  Weight: 58 kg 61.2 kg    Examination:  EOMI NCAT younger than stated age No icterus no pallor Neck soft supple S1-S2 no murmur Abdomen slightly distended no rebound No lower extremity edema Neuro intact power-other testing deferred  Data Reviewed: personally reviewed   CBC    Component Value Date/Time   WBC 7.1 11/13/2022 0436   RBC 3.76 (L) 11/13/2022 0436   HGB 11.7 (L) 11/13/2022 0436   HGB 12.7 03/29/2021 0952   HCT 35.6 (L) 11/13/2022 0436   HCT 38.9 03/29/2021 0952   PLT 196 11/13/2022 0436   PLT 187 03/29/2021 0952   MCV 94.7 11/13/2022 0436   MCV 94 03/29/2021 0952   MCH 31.1 11/13/2022 0436   MCHC 32.9 11/13/2022 0436   RDW 12.1 11/13/2022 0436   RDW 12.3 03/29/2021 0952   LYMPHSABS 1.7 11/26/2021 2009   LYMPHSABS 1.1 03/29/2021 0952   MONOABS 0.7 11/26/2021 2009   EOSABS 0.1 11/26/2021 2009   EOSABS 0.2 03/29/2021 0952   BASOSABS 0.1 11/26/2021 2009   BASOSABS 0.1 03/29/2021 0952      Latest Ref Rng & Units 11/13/2022    4:36 AM 11/12/2022    3:25 AM 11/11/2022    4:14 PM  CMP  Glucose 70 - 99 mg/dL 147  829  562   BUN 8 - 23 mg/dL 9  8  10    Creatinine 0.44 - 1.00 mg/dL 1.30  8.65  7.84   Sodium 135 - 145 mmol/L 138  132  134  Potassium 3.5 - 5.1 mmol/L 3.7  3.7  4.0   Chloride 98 - 111 mmol/L 103  99  100   CO2 22 - 32 mmol/L 29  25    Calcium 8.9 - 10.3 mg/dL 9.0  9.2    Total Protein 6.5 - 8.1 g/dL  5.4    Total Bilirubin 0.3 - 1.2 mg/dL  0.4    Alkaline Phos 38 - 126 U/L  84    AST 15 - 41 U/L  18    ALT 0 - 44 U/L  16       Radiology Studies: DG Abd Portable 1V-Small Bowel Obstruction Protocol-initial, 8 hr delay  Result Date: 11/12/2022 CLINICAL DATA:  Evaluate NG placement EXAM: PORTABLE ABDOMEN - 1 VIEW COMPARISON:  Earlier today. FINDINGS: The enteric tube tip and side port are below the level of the GE junction. The tip of the tube is in the expected location of the gastric fundus.  Paucity bowel gas limits assessment for degree of bowel dilatation. Surgical clips are again seen within the right hemiabdomen. Scoliosis deformity in lumbar degenerative disc disease. IMPRESSION: Tip and side port of enteric tube are below the level of the GE junction. Tip of the NG tube is in the gastric fundus. Electronically Signed   By: Signa Kell M.D.   On: 11/12/2022 16:21   DG Abd Portable 1 View  Result Date: 11/12/2022 CLINICAL DATA:  Nasogastric tube placement EXAM: PORTABLE ABDOMEN - 1 VIEW COMPARISON:  None Available. FINDINGS: Nasogastric tube tip overlies the proximal to mid body of the stomach. Moderate hiatal hernia. Visualized lung bases are clear. Implanted loop recorder noted. Cholecystectomy clips seen in the right upper quadrant. Moderate lumbar levoscoliosis. IMPRESSION: 1. Nasogastric tube tip within the proximal to mid body of the stomach. Electronically Signed   By: Helyn Numbers M.D.   On: 11/12/2022 03:24   CT ABDOMEN PELVIS W CONTRAST  Result Date: 11/11/2022 CLINICAL DATA:  Acute nonlocalized abdominal pain EXAM: CT ABDOMEN AND PELVIS WITH CONTRAST TECHNIQUE: Multidetector CT imaging of the abdomen and pelvis was performed using the standard protocol following bolus administration of intravenous contrast. RADIATION DOSE REDUCTION: This exam was performed according to the departmental dose-optimization program which includes automated exposure control, adjustment of the mA and/or kV according to patient size and/or use of iterative reconstruction technique. CONTRAST:  75mL OMNIPAQUE IOHEXOL 350 MG/ML SOLN COMPARISON:  10/23/2020 FINDINGS: Lower chest: Mild dependent atelectasis in the lung bases. Large esophageal hiatal hernia. Hepatobiliary: Surgical absence of the gallbladder with bile duct dilatation, likely postoperative. No change since prior study. No focal liver lesions. Pancreas: Unremarkable. No pancreatic ductal dilatation or surrounding inflammatory changes.  Spleen: Normal in size without focal abnormality. Adrenals/Urinary Tract: Adrenal glands are unremarkable. Kidneys are normal, without renal calculi, focal lesion, or hydronephrosis. Bladder is unremarkable. Stomach/Bowel: Stomach is fluid-filled without wall thickening. Pelvic small bowel loops are mildly dilated and fluid-filled without wall thickening. This is developing since prior study and could indicate localized ileus, enteritis, or early or partial obstruction. Terminal ileum is decompressed. There is a small right inguinal hernia containing small bowel which could be the cause of obstruction. Colon is decompressed. Colonic diverticula without evidence of acute diverticulitis. Vascular/Lymphatic: Prominent aortic calcification. No aneurysm or dissection. No significant lymphadenopathy. Reproductive: No pelvic masses. Other: No free air or free fluid in the abdomen. Abdominal wall musculature appears intact. Musculoskeletal: Degenerative changes in the spine. Lumbar scoliosis convex to the left. Mild anterior subluxation of L4 on  L5. Postoperative changes with apparent posterior hemilaminectomy at L4-5. laminectomy appears to have been performed since the previous study. Fluid tracks from the post laminectomy space into the soft tissues dorsal to the spine, likely representing postoperative collection. Injection granulomas in the soft tissues. IMPRESSION: 1. Dilated fluid-filled pelvic small bowel suggesting early or partial obstruction, possibly due to a right inguinal hernia. Changes could also indicate enteritis or ileus. 2. Apparent interval postoperative laminectomy at L4-5 with overlying soft tissue collection likely representing a postoperative collection. 3. Large esophageal hiatal hernia. 4. Prominent aortic atherosclerosis. Electronically Signed   By: Burman Nieves M.D.   On: 11/11/2022 18:10     Scheduled Meds:  enoxaparin (LOVENOX) injection  40 mg Subcutaneous Q24H   ondansetron (ZOFRAN)  IV  4 mg Intravenous Once   Continuous Infusions:  dextrose 5% lactated ringers 100 mL/hr at 11/13/22 0422     LOS: 2 days   Time spent: 25  Rhetta Mura, MD [facility] To contact the attending provider between 7A-7P or the covering provider during after hours 7P-7A, please log into the web site [email or web address] and access using universal [place] password for that web site. If you do not have the password, please call the hospital operator.  11/13/2022, 6:28 AM

## 2022-11-14 ENCOUNTER — Encounter (HOSPITAL_COMMUNITY): Payer: Self-pay | Admitting: Internal Medicine

## 2022-11-14 ENCOUNTER — Inpatient Hospital Stay (HOSPITAL_COMMUNITY): Payer: Medicare HMO | Admitting: Anesthesiology

## 2022-11-14 ENCOUNTER — Encounter (HOSPITAL_COMMUNITY)
Admission: EM | Disposition: A | Discharge status: Home Health | Payer: Self-pay | Source: Home / Self Care | Attending: Family Medicine

## 2022-11-14 ENCOUNTER — Other Ambulatory Visit: Payer: Self-pay

## 2022-11-14 ENCOUNTER — Inpatient Hospital Stay (HOSPITAL_COMMUNITY): Payer: Medicare HMO

## 2022-11-14 DIAGNOSIS — Z87891 Personal history of nicotine dependence: Secondary | ICD-10-CM | POA: Diagnosis not present

## 2022-11-14 DIAGNOSIS — E039 Hypothyroidism, unspecified: Secondary | ICD-10-CM

## 2022-11-14 DIAGNOSIS — I2510 Atherosclerotic heart disease of native coronary artery without angina pectoris: Secondary | ICD-10-CM

## 2022-11-14 DIAGNOSIS — K565 Intestinal adhesions [bands], unspecified as to partial versus complete obstruction: Secondary | ICD-10-CM | POA: Diagnosis not present

## 2022-11-14 DIAGNOSIS — D638 Anemia in other chronic diseases classified elsewhere: Secondary | ICD-10-CM

## 2022-11-14 DIAGNOSIS — K56609 Unspecified intestinal obstruction, unspecified as to partial versus complete obstruction: Secondary | ICD-10-CM | POA: Diagnosis not present

## 2022-11-14 HISTORY — PX: LAPAROTOMY: SHX154

## 2022-11-14 LAB — CBC WITH DIFFERENTIAL/PLATELET
Abs Immature Granulocytes: 0.02 10*3/uL (ref 0.00–0.07)
Basophils Absolute: 0 10*3/uL (ref 0.0–0.1)
Basophils Relative: 0 %
Eosinophils Absolute: 0 10*3/uL (ref 0.0–0.5)
Eosinophils Relative: 0 %
HCT: 36.3 % (ref 36.0–46.0)
Hemoglobin: 12.6 g/dL (ref 12.0–15.0)
Immature Granulocytes: 0 %
Lymphocytes Relative: 12 %
Lymphs Abs: 0.8 10*3/uL (ref 0.7–4.0)
MCH: 31.9 pg (ref 26.0–34.0)
MCHC: 34.7 g/dL (ref 30.0–36.0)
MCV: 91.9 fL (ref 80.0–100.0)
Monocytes Absolute: 0.8 10*3/uL (ref 0.1–1.0)
Monocytes Relative: 12 %
Neutro Abs: 5.1 10*3/uL (ref 1.7–7.7)
Neutrophils Relative %: 76 %
Platelets: 177 10*3/uL (ref 150–400)
RBC: 3.95 MIL/uL (ref 3.87–5.11)
RDW: 12 % (ref 11.5–15.5)
WBC: 6.8 10*3/uL (ref 4.0–10.5)
nRBC: 0 % (ref 0.0–0.2)

## 2022-11-14 LAB — RENAL FUNCTION PANEL
Albumin: 3 g/dL — ABNORMAL LOW (ref 3.5–5.0)
Anion gap: 9 (ref 5–15)
BUN: 12 mg/dL (ref 8–23)
CO2: 30 mmol/L (ref 22–32)
Calcium: 9.2 mg/dL (ref 8.9–10.3)
Chloride: 100 mmol/L (ref 98–111)
Creatinine, Ser: 0.74 mg/dL (ref 0.44–1.00)
GFR, Estimated: >60 mL/min (ref >60)
Glucose, Bld: 135 mg/dL — ABNORMAL HIGH (ref 70–99)
Phosphorus: 3.4 mg/dL (ref 2.5–4.6)
Potassium: 3.3 mmol/L — ABNORMAL LOW (ref 3.5–5.1)
Sodium: 139 mmol/L (ref 135–145)

## 2022-11-14 LAB — GLUCOSE, CAPILLARY
Glucose-Capillary: 126 mg/dL — ABNORMAL HIGH (ref 70–99)
Glucose-Capillary: 113 mg/dL — ABNORMAL HIGH (ref 70–99)
Glucose-Capillary: 100 mg/dL — ABNORMAL HIGH (ref 70–99)
Glucose-Capillary: 96 mg/dL (ref 70–99)
Glucose-Capillary: 118 mg/dL — ABNORMAL HIGH (ref 70–99)
Glucose-Capillary: 169 mg/dL — ABNORMAL HIGH (ref 70–99)

## 2022-11-14 LAB — MAGNESIUM: Magnesium: 1.8 mg/dL (ref 1.7–2.4)

## 2022-11-14 LAB — TYPE AND SCREEN
ABO/RH(D): A POS
Antibody Screen: NEGATIVE

## 2022-11-14 SURGERY — LAPAROTOMY, EXPLORATORY
Anesthesia: General

## 2022-11-14 MED ORDER — SUGAMMADEX SODIUM 200 MG/2ML IV SOLN
INTRAVENOUS | Status: DC | PRN
  Administered 2022-11-14: 200 mg via INTRAVENOUS

## 2022-11-14 MED ORDER — BENZOCAINE 20 % MT AERO
INHALATION_SPRAY | Freq: Two times a day (BID) | OROMUCOSAL | Status: DC | PRN
  Filled 2022-11-14: qty 57

## 2022-11-14 MED ORDER — INSULIN ASPART 100 UNIT/ML IJ SOLN: 0.0000 [IU] | INTRAMUSCULAR | Status: DC | PRN

## 2022-11-14 MED ORDER — POTASSIUM CHLORIDE 10 MEQ/100ML IV SOLN
10.0000 meq | INTRAVENOUS | Status: AC
  Administered 2022-11-14 (×2): 10 meq via INTRAVENOUS
  Filled 2022-11-14 (×2): qty 100

## 2022-11-14 MED ORDER — TRAMADOL HCL 50 MG PO TABS
50.0000 mg | ORAL_TABLET | Freq: Four times a day (QID) | ORAL | Status: DC | PRN
  Filled 2022-11-14: qty 1

## 2022-11-14 MED ORDER — DEXAMETHASONE SODIUM PHOSPHATE 10 MG/ML IJ SOLN
INTRAMUSCULAR | Status: DC | PRN
  Administered 2022-11-14: 4 mg via INTRAVENOUS

## 2022-11-14 MED ORDER — PROPOFOL 10 MG/ML IV BOLUS
INTRAVENOUS | Status: AC
  Filled 2022-11-14: qty 20

## 2022-11-14 MED ORDER — DEXMEDETOMIDINE HCL IN NACL 80 MCG/20ML IV SOLN
INTRAVENOUS | Status: AC
  Filled 2022-11-14: qty 20

## 2022-11-14 MED ORDER — ORAL CARE MOUTH RINSE: 15.0000 mL | Freq: Once | OROMUCOSAL | Status: AC

## 2022-11-14 MED ORDER — SODIUM CHLORIDE 0.9 % IV SOLN
2.0000 g | INTRAVENOUS | Status: AC
  Administered 2022-11-14: 2 g via INTRAVENOUS
  Filled 2022-11-14: qty 2

## 2022-11-14 MED ORDER — AMISULPRIDE (ANTIEMETIC) 5 MG/2ML IV SOLN: 10.0000 mg | Freq: Once | INTRAVENOUS | Status: DC | PRN

## 2022-11-14 MED ORDER — ONDANSETRON HCL 4 MG/2ML IJ SOLN
INTRAMUSCULAR | Status: AC
  Filled 2022-11-14: qty 4

## 2022-11-14 MED ORDER — FENTANYL CITRATE (PF) 100 MCG/2ML IJ SOLN
INTRAMUSCULAR | Status: AC
  Filled 2022-11-14: qty 2

## 2022-11-14 MED ORDER — PHENYLEPHRINE HCL-NACL 20-0.9 MG/250ML-% IV SOLN
INTRAVENOUS | Status: DC | PRN
  Administered 2022-11-14: 20 ug/min via INTRAVENOUS

## 2022-11-14 MED ORDER — 0.9 % SODIUM CHLORIDE (POUR BTL) OPTIME
TOPICAL | Status: DC | PRN
  Administered 2022-11-14: 1000 mL

## 2022-11-14 MED ORDER — LIDOCAINE 2% (20 MG/ML) 5 ML SYRINGE
INTRAMUSCULAR | Status: AC
  Filled 2022-11-14: qty 10

## 2022-11-14 MED ORDER — LACTATED RINGERS IV SOLN: INTRAVENOUS | Status: DC | PRN

## 2022-11-14 MED ORDER — ONDANSETRON HCL 4 MG/2ML IJ SOLN
INTRAMUSCULAR | Status: DC | PRN
  Administered 2022-11-14: 4 mg via INTRAVENOUS

## 2022-11-14 MED ORDER — CHLORHEXIDINE GLUCONATE 0.12 % MT SOLN
15.0000 mL | Freq: Once | OROMUCOSAL | Status: AC
  Administered 2022-11-14: 15 mL via OROMUCOSAL
  Filled 2022-11-14: qty 15

## 2022-11-14 MED ORDER — GLYCOPYRROLATE PF 0.2 MG/ML IJ SOSY
PREFILLED_SYRINGE | INTRAMUSCULAR | Status: AC
  Filled 2022-11-14: qty 1

## 2022-11-14 MED ORDER — FENTANYL CITRATE (PF) 100 MCG/2ML IJ SOLN
25.0000 ug | INTRAMUSCULAR | Status: DC | PRN
  Administered 2022-11-14 (×2): 50 ug via INTRAVENOUS

## 2022-11-14 MED ORDER — FENTANYL CITRATE (PF) 250 MCG/5ML IJ SOLN
INTRAMUSCULAR | Status: AC
  Filled 2022-11-14: qty 5

## 2022-11-14 MED ORDER — ALBUMIN HUMAN 5 % IV SOLN: INTRAVENOUS | Status: DC | PRN

## 2022-11-14 MED ORDER — SUCCINYLCHOLINE CHLORIDE 200 MG/10ML IV SOSY
PREFILLED_SYRINGE | INTRAVENOUS | Status: DC | PRN
  Administered 2022-11-14: 80 mg via INTRAVENOUS

## 2022-11-14 MED ORDER — ROCURONIUM BROMIDE 10 MG/ML (PF) SYRINGE
PREFILLED_SYRINGE | INTRAVENOUS | Status: AC
  Filled 2022-11-14: qty 30

## 2022-11-14 MED ORDER — PHENYLEPHRINE 80 MCG/ML (10ML) SYRINGE FOR IV PUSH (FOR BLOOD PRESSURE SUPPORT)
PREFILLED_SYRINGE | INTRAVENOUS | Status: AC
  Filled 2022-11-14: qty 10

## 2022-11-14 MED ORDER — FENTANYL CITRATE (PF) 250 MCG/5ML IJ SOLN
INTRAMUSCULAR | Status: DC | PRN
  Administered 2022-11-14: 100 ug via INTRAVENOUS
  Administered 2022-11-14: 50 ug via INTRAVENOUS

## 2022-11-14 MED ORDER — DEXAMETHASONE SODIUM PHOSPHATE 10 MG/ML IJ SOLN
INTRAMUSCULAR | Status: AC
  Filled 2022-11-14: qty 2

## 2022-11-14 MED ORDER — LIDOCAINE 2% (20 MG/ML) 5 ML SYRINGE
INTRAMUSCULAR | Status: DC | PRN
  Administered 2022-11-14: 60 mg via INTRAVENOUS

## 2022-11-14 MED ORDER — PROPOFOL 10 MG/ML IV BOLUS
INTRAVENOUS | Status: DC | PRN
  Administered 2022-11-14: 70 mg via INTRAVENOUS
  Administered 2022-11-14: 50 mg via INTRAVENOUS

## 2022-11-14 MED ORDER — LACTATED RINGERS IV SOLN: INTRAVENOUS | Status: DC

## 2022-11-14 MED ORDER — ROCURONIUM BROMIDE 10 MG/ML (PF) SYRINGE
PREFILLED_SYRINGE | INTRAVENOUS | Status: DC | PRN
  Administered 2022-11-14: 30 mg via INTRAVENOUS

## 2022-11-14 MED ORDER — SUCCINYLCHOLINE CHLORIDE 200 MG/10ML IV SOSY
PREFILLED_SYRINGE | INTRAVENOUS | Status: AC
  Filled 2022-11-14: qty 10

## 2022-11-14 SURGICAL SUPPLY — 40 items
APL PRP STRL LF DISP 70% ISPRP (MISCELLANEOUS) ×1
BAG COUNTER SPONGE SURGICOUNT (BAG) ×1 IMPLANT
BAG SPNG CNTER NS LX DISP (BAG) ×1
BLADE CLIPPER SURG (BLADE) IMPLANT
CANISTER SUCT 3000ML PPV (MISCELLANEOUS) ×1 IMPLANT
CHLORAPREP W/TINT 26 (MISCELLANEOUS) ×1 IMPLANT
COVER SURGICAL LIGHT HANDLE (MISCELLANEOUS) ×1 IMPLANT
DRAPE LAPAROSCOPIC ABDOMINAL (DRAPES) ×1 IMPLANT
DRAPE WARM FLUID 44X44 (DRAPES) ×1 IMPLANT
DRSG OPSITE POSTOP 4X8 (GAUZE/BANDAGES/DRESSINGS) IMPLANT
ELECT BLADE 6.5 EXT (BLADE) IMPLANT
ELECT REM PT RETURN 9FT ADLT (ELECTROSURGICAL) ×1
ELECTRODE REM PT RTRN 9FT ADLT (ELECTROSURGICAL) ×1 IMPLANT
GLOVE BIO SURGEON STRL SZ7.5 (GLOVE) ×1 IMPLANT
GLOVE INDICATOR 8.0 STRL GRN (GLOVE) ×1 IMPLANT
GOWN STRL REUS W/ TWL LRG LVL3 (GOWN DISPOSABLE) ×1 IMPLANT
GOWN STRL REUS W/ TWL XL LVL3 (GOWN DISPOSABLE) ×1 IMPLANT
GOWN STRL REUS W/TWL LRG LVL3 (GOWN DISPOSABLE) ×1
GOWN STRL REUS W/TWL XL LVL3 (GOWN DISPOSABLE) ×1
HANDLE SUCTION POOLE (INSTRUMENTS) ×1 IMPLANT
KIT BASIN OR (CUSTOM PROCEDURE TRAY) ×1 IMPLANT
KIT TURNOVER KIT B (KITS) ×1 IMPLANT
LIGASURE IMPACT 36 18CM CVD LR (INSTRUMENTS) IMPLANT
NS IRRIG 1000ML POUR BTL (IV SOLUTION) ×2 IMPLANT
PACK GENERAL/GYN (CUSTOM PROCEDURE TRAY) ×1 IMPLANT
PAD ARMBOARD 7.5X6 YLW CONV (MISCELLANEOUS) ×1 IMPLANT
PENCIL SMOKE EVACUATOR (MISCELLANEOUS) ×1 IMPLANT
SPECIMEN JAR LARGE (MISCELLANEOUS) IMPLANT
SPONGE T-LAP 18X18 ~~LOC~~+RFID (SPONGE) IMPLANT
STAPLER VISISTAT 35W (STAPLE) ×1 IMPLANT
SUCTION POOLE HANDLE (INSTRUMENTS) ×1
SUT PDS AB 1 CT1 36 (SUTURE) IMPLANT
SUT PDS AB 1 TP1 96 (SUTURE) ×2 IMPLANT
SUT SILK 2 0 SH CR/8 (SUTURE) ×1 IMPLANT
SUT SILK 2 0 TIES 10X30 (SUTURE) ×1 IMPLANT
SUT SILK 3 0 SH CR/8 (SUTURE) ×1 IMPLANT
SUT SILK 3 0 TIES 10X30 (SUTURE) ×1 IMPLANT
TOWEL GREEN STERILE (TOWEL DISPOSABLE) ×1 IMPLANT
TRAY FOLEY MTR SLVR 16FR STAT (SET/KITS/TRAYS/PACK) ×1 IMPLANT
YANKAUER SUCT BULB TIP NO VENT (SUCTIONS) IMPLANT

## 2022-11-14 NOTE — Progress Notes (Signed)
[facility] Progress Note     Subjective: CC:  Reports she still feels bloated. Has been trying to pass gas but has been unable - no flatus, no diarrhea.   Objective: Vital signs in last 24 hours: Temp:  [97.3 F (36.3 C)-98.5 F (36.9 C)] 97.3 F (36.3 C) (04/25 0818) Pulse Rate:  [65-71] 66 (04/25 0818) Resp:  [17-18] 18 (04/25 0818) BP: (136-177)/(60-72) 146/69 (04/25 0818) SpO2:  [94 %-98 %] 98 % (04/25 0818) Last BM Date : 11/11/22  Intake/Output from previous day: 04/24 0701 - 04/25 0700 In: 2127.6 [I.V.:2127.6] Out: 3450 [Emesis/NG output:3450] Intake/Output this shift: No intake/output data recorded.  PE: Gen:  Alert, NAD, pleasant Card:  Regular rate and rhythm Pulm:  Normal effort ORA Abd: Soft, abdominal distention with tenderness and mild guarding in the central and lower abdomen, no rebound tenderness, no palpable hernias.  NG in place, bilious effluent in cannister, 3,450 mL/24h Skin: warm and dry, no rashes  Psych: A&Ox3   Lab Results:  Recent Labs    11/13/22 0436 11/14/22 0348  WBC 7.1 6.8  HGB 11.7* 12.6  HCT 35.6* 36.3  PLT 196 177   BMET Recent Labs    11/13/22 0436 11/14/22 0348  NA 138 139  K 3.7 3.3*  CL 103 100  CO2 29 30  GLUCOSE 113* 135*  BUN 9 12  CREATININE 0.83 0.74  CALCIUM 9.0 9.2   PT/INR No results for input(s): "LABPROT", "INR" in the last 72 hours. CMP     Component Value Date/Time   NA 139 11/14/2022 0348   NA 139 10/25/2022 1020   K 3.3 (L) 11/14/2022 0348   CL 100 11/14/2022 0348   CO2 30 11/14/2022 0348   GLUCOSE 135 (H) 11/14/2022 0348   BUN 12 11/14/2022 0348   BUN 17 10/25/2022 1020   CREATININE 0.74 11/14/2022 0348   CREATININE 0.84 03/14/2016 1020   CALCIUM 9.2 11/14/2022 0348   PROT 5.4 (L) 11/12/2022 0325   PROT 6.1 10/25/2022 1020   ALBUMIN 3.0 (L) 11/14/2022 0348   ALBUMIN 4.2 10/25/2022 1020   AST 18 11/12/2022 0325   ALT 16 11/12/2022 0325   ALKPHOS 84 11/12/2022 0325    BILITOT 0.4 11/12/2022 0325   BILITOT 0.3 10/25/2022 1020   GFRNONAA >60 11/14/2022 0348   GFRNONAA 65 03/14/2016 1020   GFRAA 86 07/27/2020 0930   GFRAA 75 03/14/2016 1020   Lipase     Component Value Date/Time   LIPASE 33 11/11/2022 1510       Studies/Results: DG Abd Portable 1V  Result Date: 11/14/2022 CLINICAL DATA:  Small-bowel obstruction. EXAM: PORTABLE ABDOMEN - 1 VIEW COMPARISON:  Radiographs 11/13/2022 and 11/12/2022.  CT 11/11/2022. FINDINGS: 0824 hours. Enteric tube is unchanged, overlying the mid lumbar spine and likely within the mid stomach. Previously administered enteric contrast remains within moderately dilated small bowel in the pelvis. There is a persistent dilated, air-filled loop of small bowel in the left upper quadrant. No significant colonic contrast identified. There is stool in the right colon. Surgical clips are present in the right upper quadrant. No evidence of extravasated enteric contrast or pneumoperitoneum. Moderate convex left thoracolumbar scoliosis again noted. IMPRESSION: No significant change in findings of small-bowel obstruction. Enteric contrast remains within moderately dilated small bowel loops in the pelvis. No significant colonic contrast identified. Electronically Signed   By: Carey Bullocks M.D.   On: 11/14/2022 08:39   DG Abd Portable 1V-Small Bowel Obstruction Protocol-initial, 8 hr delay  Result Date: 11/13/2022 CLINICAL DATA:  Delayed images after contrast administration. EXAM: PORTABLE ABDOMEN - 1 VIEW COMPARISON:  Previous examination done earlier today FINDINGS: There is dilation of small-bowel loops measuring up to 4 cm in diameter. Oral contrast is still in the lumen of dilated small bowel loops. Oral contrast has not reached the right colon. Tip of NG tube is seen in the stomach. Surgical clips are seen in right upper quadrant. Rotoscoliosis is seen thoracolumbar junction with convexity to the left. IMPRESSION: Oral contrast remains  within dilated small bowel loops suggesting partial small bowel obstruction. Electronically Signed   By: Ernie Avena M.D.   On: 11/13/2022 19:26   DG Abd 1 View  Result Date: 11/13/2022 CLINICAL DATA:  Small-bowel obstruction EXAM: ABDOMEN - 1 VIEW COMPARISON:  Portable exam 0645 hours compared to 11/12/2022 FINDINGS: Surgical clips RIGHT upper quadrant consistent with cholecystectomy. Tip of nasogastric tube projects over proximal stomach. Dilated air-filled small bowel loops in the LEFT upper quadrant and LEFT mid abdomen consistent with small bowel obstruction. No bowel wall thickening. Small amount of scattered colonic gas and stool. Osseous demineralization with levoconvex thoracolumbar scoliosis. Atherosclerotic calcification aorta. IMPRESSION: Persistent small bowel obstruction. Electronically Signed   By: Ulyses Southward M.D.   On: 11/13/2022 08:45   DG Abd Portable 1V-Small Bowel Obstruction Protocol-initial, 8 hr delay  Result Date: 11/12/2022 CLINICAL DATA:  Evaluate NG placement EXAM: PORTABLE ABDOMEN - 1 VIEW COMPARISON:  Earlier today. FINDINGS: The enteric tube tip and side port are below the level of the GE junction. The tip of the tube is in the expected location of the gastric fundus. Paucity bowel gas limits assessment for degree of bowel dilatation. Surgical clips are again seen within the right hemiabdomen. Scoliosis deformity in lumbar degenerative disc disease. IMPRESSION: Tip and side port of enteric tube are below the level of the GE junction. Tip of the NG tube is in the gastric fundus. Electronically Signed   By: Signa Kell M.D.   On: 11/12/2022 16:21    Anti-infectives: Anti-infectives (From admission, onward)    None        Assessment/Plan  SBO, secondary to adhesions vs RIH that is now reduced.  - AFVSS, WBC 7.1 - SBO protocol repeated 4/24 >> no contrast in colon. Persistent distention of the abdomen with lower abdominal tenderness and high NG output.    Unfortunately she has not improved with non-operative measures. Recommend exploratory laparotomy, possible small bowel resection for management of SBO. I did discuss the possibility of attempted laparoscopy with the patient, but I think it is likely she will need an open procedure. We discussed the risks of surgery as well as the post-operative recovery and potential need for SNF placement. She is agreeable to proceed. Her daughter at the bedside during this discussion.  She tolerated spinal surgery in march with no complications. She has been mobilizing at home independently as well as with a cane. She lives alone in a one story house and has a lot of family support as well as support from church members. Denies  a cardiac history. Has has history of TIA on ASA at home.   FEN: NPO, NG to LIWS, ok for ice chips ID: none VTE SCD's, lovenox Foley: none    LOS: 3 days   I reviewed nursing notes, hospitalist notes, last 24 h vitals and pain scores, last 48 h intake and output, last 24 h labs and trends, and last 24 h imaging results.  This  care required moderate level of medical decision making.   Hosie Spangle, PA-C [facility] Please see Amion for pager number during day hours 7:00am-4:30pm

## 2022-11-14 NOTE — Anesthesia Preprocedure Evaluation (Addendum)
Anesthesia Evaluation  Patient identified by MRN, date of birth, ID band Patient awake    Reviewed: Allergy & Precautions, NPO status , Patient's Chart, lab work & pertinent test results  History of Anesthesia Complications (+) PONV, Family history of anesthesia reaction and history of anesthetic complications (slow to wake up after colonoscopy)  Airway Mallampati: III  TM Distance: >3 FB Neck ROM: Full   Comment: Previous grade I view with MAC 3 Dental  (+) Dental Advisory Given   Pulmonary neg shortness of breath, neg sleep apnea, neg COPD, neg recent URI, former smoker Bronchiectasis, multiple pulmonary nodules   Pulmonary exam normal breath sounds clear to auscultation       Cardiovascular (-) angina + CAD  (-) Cardiac Stents and (-) CABG (-) dysrhythmias  Rhythm:Regular Rate:Normal  HLD  TTE 11/27/2021: IMPRESSIONS     1. Left ventricular ejection fraction, by estimation, is 60 to 65%. The  left ventricle has normal function. The left ventricle has no regional  wall motion abnormalities. Left ventricular diastolic parameters are  consistent with Grade I diastolic  dysfunction (impaired relaxation).   2. Right ventricular systolic function is normal. The right ventricular  size is normal. There is normal pulmonary artery systolic pressure. The  estimated right ventricular systolic pressure is 30.9 mmHg.   3. The mitral valve is grossly normal. Trivial mitral valve  regurgitation. No evidence of mitral stenosis.   4. The aortic valve is tricuspid. Aortic valve regurgitation is not  visualized. No aortic stenosis is present.   5. The inferior vena cava is normal in size with greater than 50%  respiratory variability, suggesting right atrial pressure of 3 mmHg.   6. Agitated saline contrast bubble study was negative, with no evidence  of any interatrial shunt.     Neuro/Psych neg Seizures PSYCHIATRIC DISORDERS (ADD)       TIA Neuromuscular disease (s/p lumbar laminectomy) CVA (12/2018), No Residual Symptoms    GI/Hepatic Neg liver ROS, hiatal hernia,,,SBO   Endo/Other  neg diabetesHypothyroidism    Renal/GU negative Renal ROS     Musculoskeletal  (+) Arthritis , Osteoarthritis,    Abdominal   Peds  Hematology  (+) Blood dyscrasia, anemia   Anesthesia Other Findings   Reproductive/Obstetrics                             Anesthesia Physical Anesthesia Plan  ASA: 3  Anesthesia Plan: General   Post-op Pain Management:    Induction: Intravenous and Rapid sequence  PONV Risk Score and Plan: 4 or greater and Ondansetron, Dexamethasone and Treatment may vary due to age or medical condition  Airway Management Planned: Oral ETT  Additional Equipment:   Intra-op Plan:   Post-operative Plan: Extubation in OR  Informed Consent: I have reviewed the patients History and Physical, chart, labs and discussed the procedure including the risks, benefits and alternatives for the proposed anesthesia with the patient or authorized representative who has indicated his/her understanding and acceptance.   Patient has DNR.  Discussed DNR with patient and Suspend DNR.   Dental advisory given  Plan Discussed with: Anesthesiologist and CRNA  Anesthesia Plan Comments: (Risks of general anesthesia discussed including, but not limited to, sore throat, hoarse voice, chipped/damaged teeth, injury to vocal cords, nausea and vomiting, allergic reactions, lung infection, heart attack, stroke, and death. All questions answered. )       Anesthesia Quick Evaluation

## 2022-11-14 NOTE — Progress Notes (Signed)
Spoke with Dr. Cliffton Asters states he would like catheter to remain in place at this time.

## 2022-11-14 NOTE — Transfer of Care (Signed)
Immediate Anesthesia Transfer of Care Note  Patient: Cheryl Martin  Procedure(s) Performed: EXPLORATORY LAPAROTOMY, LYSIS OF ADHESIONS  Patient Location: PACU  Anesthesia Type:General  Level of Consciousness: awake, alert , and oriented  Airway & Oxygen Therapy: Patient Spontanous Breathing and Patient connected to nasal cannula oxygen  Post-op Assessment: Report given to RN and Post -op Vital signs reviewed and stable  Post vital signs: Reviewed and stable  Last Vitals:  Vitals Value Taken Time  BP 163/92 11/14/22 1550  Temp    Pulse 81 11/14/22 1550  Resp 14 11/14/22 1552  SpO2 100 11/14/22 1550  Vitals shown include unvalidated device data.  Last Pain:  Vitals:   11/14/22 1426  TempSrc:   PainSc: 0-No pain         Complications: No notable events documented.

## 2022-11-14 NOTE — Progress Notes (Signed)
PROGRESS NOTE   Cheryl Martin  ZOX:[id number] DOB: [birth year] DOA: 11/11/2022 PCP: [name], PA  Brief Narrative:   89 year old white female recent L3-L4, L4-L5 hemilaminectomy with foraminotomies status post surgery Dr. [name] Presumed TIA 11/2021 Hypovolemic hyponatremia HLD ?  Urge incontinence followed by urology in [facility] Prior cholecystectomy/hysterectomy-present emergency room/20 08/2022 abdominal pain, N, V, Rx fentanyl- CT scan ABD pelvis = early SBO + dilated small bowel loops pelvis + right sided inguinal hernia  4/22 General Surgery consulted-Gastrografin protocol  Hospital-Problem based course  Partial SBO Not improved--going to surgery tentatively later today-family aware Benzocaine spray  Previous TIA 11/2021 Hold all oral meds-resume asa on discharge  Recent Lumbar surgery Seems improved form that time--tolerated procedure well  Hypovolemic hyponatremia Resolved, because is n.p.o. will continue D5 LR 100 cc/H  HTN, uncontrolled On as needed metoprolol and hydralazine IV Add clonidine patch 0.1   DVT prophylaxis: Lovenox Code Status: DNR confirmed Family Communication: None present Disposition:  Status is: Inpatient Remains inpatient appropriate because:   Going for surgeyr later today     Subjective:  Not improved No flatus Abd pain + Sore throat +  Objective: Vitals:   11/13/22 1532 11/13/22 2027 11/14/22 0354 11/14/22 0818  BP: (!) 177/66 136/60 (!) 154/72 (!) 146/69  Pulse: 71 67 65 66  Resp: 18 17 18 18   Temp: 97.8 F (36.6 C) 98.5 F (36.9 C) 98.1 F (36.7 C) (!) 97.3 F (36.3 C)  TempSrc: Oral Oral Oral Oral  SpO2: 96% 97% 98% 98%  Weight:      Height:        Intake/Output Summary (Last 24 hours) at 11/14/2022 1100 Last data filed at 11/14/2022 0641 Gross per 24 hour  Intake 2127.61 ml  Output 2750 ml  Net -622.39 ml    Filed Weights   11/11/22 1508 11/12/22 1305  Weight: 58 kg 61.2 kg     Examination:  EOMI NCAT perrla icteric S1-S2 no murmur Abdomen tender to light pressure, distended No lower extremity edema Neuro intact power  Data Reviewed: personally reviewed   CBC    Component Value Date/Time   WBC 6.8 11/14/2022 0348   RBC 3.95 11/14/2022 0348   HGB 12.6 11/14/2022 0348   HGB 12.7 03/29/2021 0952   HCT 36.3 11/14/2022 0348   HCT 38.9 03/29/2021 0952   PLT 177 11/14/2022 0348   PLT 187 03/29/2021 0952   MCV 91.9 11/14/2022 0348   MCV 94 03/29/2021 0952   MCH 31.9 11/14/2022 0348   MCHC 34.7 11/14/2022 0348   RDW 12.0 11/14/2022 0348   RDW 12.3 03/29/2021 0952   LYMPHSABS 0.8 11/14/2022 0348   LYMPHSABS 1.1 03/29/2021 0952   MONOABS 0.8 11/14/2022 0348   EOSABS 0.0 11/14/2022 0348   EOSABS 0.2 03/29/2021 0952   BASOSABS 0.0 11/14/2022 0348   BASOSABS 0.1 03/29/2021 0952      Latest Ref Rng & Units 11/14/2022    3:48 AM 11/13/2022    4:36 AM 11/12/2022    3:25 AM  CMP  Glucose 70 - 99 mg/dL 811  914  782   BUN 8 - 23 mg/dL 12  9  8    Creatinine 0.44 - 1.00 mg/dL 9.56  2.13  0.86   Sodium 135 - 145 mmol/L 139  138  132   Potassium 3.5 - 5.1 mmol/L 3.3  3.7  3.7   Chloride 98 - 111 mmol/L 100  103  99   CO2 22 - 32 mmol/L 30  29  25   Calcium 8.9 - 10.3 mg/dL 9.2  9.0  9.2   Total Protein 6.5 - 8.1 g/dL   5.4   Total Bilirubin 0.3 - 1.2 mg/dL   0.4   Alkaline Phos 38 - 126 U/L   84   AST 15 - 41 U/L   18   ALT 0 - 44 U/L   16      Radiology Studies: DG Abd Portable 1V  Result Date: 11/14/2022 CLINICAL DATA:  Small-bowel obstruction. EXAM: PORTABLE ABDOMEN - 1 VIEW COMPARISON:  Radiographs 11/13/2022 and 11/12/2022.  CT 11/11/2022. FINDINGS: 0824 hours. Enteric tube is unchanged, overlying the mid lumbar spine and likely within the mid stomach. Previously administered enteric contrast remains within moderately dilated small bowel in the pelvis. There is a persistent dilated, air-filled loop of small bowel in the left upper quadrant. No  significant colonic contrast identified. There is stool in the right colon. Surgical clips are present in the right upper quadrant. No evidence of extravasated enteric contrast or pneumoperitoneum. Moderate convex left thoracolumbar scoliosis again noted. IMPRESSION: No significant change in findings of small-bowel obstruction. Enteric contrast remains within moderately dilated small bowel loops in the pelvis. No significant colonic contrast identified. Electronically Signed   By: Carey Bullocks M.D.   On: 11/14/2022 08:39   DG Abd Portable 1V-Small Bowel Obstruction Protocol-initial, 8 hr delay  Result Date: 11/13/2022 CLINICAL DATA:  Delayed images after contrast administration. EXAM: PORTABLE ABDOMEN - 1 VIEW COMPARISON:  Previous examination done earlier today FINDINGS: There is dilation of small-bowel loops measuring up to 4 cm in diameter. Oral contrast is still in the lumen of dilated small bowel loops. Oral contrast has not reached the right colon. Tip of NG tube is seen in the stomach. Surgical clips are seen in right upper quadrant. Rotoscoliosis is seen thoracolumbar junction with convexity to the left. IMPRESSION: Oral contrast remains within dilated small bowel loops suggesting partial small bowel obstruction. Electronically Signed   By: Ernie Avena M.D.   On: 11/13/2022 19:26   DG Abd 1 View  Result Date: 11/13/2022 CLINICAL DATA:  Small-bowel obstruction EXAM: ABDOMEN - 1 VIEW COMPARISON:  Portable exam 0645 hours compared to 11/12/2022 FINDINGS: Surgical clips RIGHT upper quadrant consistent with cholecystectomy. Tip of nasogastric tube projects over proximal stomach. Dilated air-filled small bowel loops in the LEFT upper quadrant and LEFT mid abdomen consistent with small bowel obstruction. No bowel wall thickening. Small amount of scattered colonic gas and stool. Osseous demineralization with levoconvex thoracolumbar scoliosis. Atherosclerotic calcification aorta. IMPRESSION:  Persistent small bowel obstruction. Electronically Signed   By: Ulyses Southward M.D.   On: 11/13/2022 08:45   DG Abd Portable 1V-Small Bowel Obstruction Protocol-initial, 8 hr delay  Result Date: 11/12/2022 CLINICAL DATA:  Evaluate NG placement EXAM: PORTABLE ABDOMEN - 1 VIEW COMPARISON:  Earlier today. FINDINGS: The enteric tube tip and side port are below the level of the GE junction. The tip of the tube is in the expected location of the gastric fundus. Paucity bowel gas limits assessment for degree of bowel dilatation. Surgical clips are again seen within the right hemiabdomen. Scoliosis deformity in lumbar degenerative disc disease. IMPRESSION: Tip and side port of enteric tube are below the level of the GE junction. Tip of the NG tube is in the gastric fundus. Electronically Signed   By: Signa Kell M.D.   On: 11/12/2022 16:21     Scheduled Meds:  cloNIDine  0.1 mg Transdermal Weekly  enoxaparin (LOVENOX) injection  40 mg Subcutaneous Q24H   ondansetron (ZOFRAN) IV  4 mg Intravenous Once   Continuous Infusions:  cefoTEtan (CEFOTAN) IV     dextrose 5% lactated ringers 100 mL/hr at 11/14/22 0127   potassium chloride 10 mEq (11/14/22 1019)     LOS: 3 days   Time spent: 76  Rhetta Mura, MD [facility] To contact the attending provider between 7A-7P or the covering provider during after hours 7P-7A, please log into the web site [email or web address] and access using universal [place] password for that web site. If you do not have the password, please call the hospital operator.  11/14/2022, 11:00 AM

## 2022-11-14 NOTE — Progress Notes (Signed)
Patient transported to surgery at this time. Patient received CHG bath,all jewelry and clothing removed at this time. Patient consent in chart sent with transport.

## 2022-11-14 NOTE — Care Management Important Message (Signed)
Important Message  Patient Details  Name: Cheryl Martin MRN: [id number] Date of Birth: 1930-12-24   Medicare Important Message Given:  Yes     Dorena Bodo 11/14/2022, 2:31 PM

## 2022-11-14 NOTE — Op Note (Signed)
11/14/2022  3:39 PM  PATIENT:  Cheryl Martin  89 y.o. female  Patient Care Team: [name], PA as PCP - General (Family Medicine) Regan Lemming, MD as PCP - Electrophysiology (Cardiology) Orbie Pyo, MD as PCP - Cardiology (Cardiology) Salvatore Marvel, MD as Consulting Physician (Orthopedic Surgery) Laurena Slimmer, MD (Inactive) as Consulting Physician (Endocrinology) Luciana Axe Alford Highland, MD as Consulting Physician (Ophthalmology) Aris Lot, MD as Consulting Physician (Dermatology) Chuck Hint, MD as Consulting Physician (Vascular Surgery) Ollen Gross, MD as Consulting Physician (Orthopedic Surgery) Flo Shanks, MD as Consulting Physician (Otolaryngology) Aquilla Hacker, PA-C as Physician Assistant (Physician Assistant) Adela Lank, Willaim Rayas, MD as Consulting Physician (Gastroenterology) Lars Masson, MD as Consulting Physician (Cardiology) Windell Norfolk, MD as Consulting Physician (Neurology)  PRE-OPERATIVE DIAGNOSIS:  Small bowel obstruction  POST-OPERATIVE DIAGNOSIS:  Same  PROCEDURE:  Exploratory laparotomy with lysis of adhesions  SURGEON:  Stephanie Coup. Emett Stapel, MD  ASSISTANT: OR staff  ANESTHESIA:   general  COUNTS:  Sponge, needle and instrument counts were reported correct x2 at the conclusion of the operation.  EBL: 10 mL  DRAINS: None  SPECIMEN: None  COMPLICATIONS: None  FINDINGS: Single omental type adhesive band to the small bowel mesentery creating a not incarcerated compression on loop of small bowel.  DISPOSITION: PACU in satisfactory condition  DESCRIPTION: The patient was identified in preop holding and taken to the OR where she was placed on the operating room table. SCDs were placed. Her NG remained to suction. General endotracheal anesthesia was induced without difficulty.  A Foley catheter was placed by nursing under sterile conditions.  Her pressure points were then padded.  She was then  prepped and draped in the usual sterile fashion. A surgical timeout was performed indicating the correct patient, procedure, positioning and need for preoperative antibiotics.   We began by making a low midline incision infraumbilically.  The subcutaneous tissues divided electrocautery.  The fascia is incised the midline the peritoneum was cautiously entered sharply.  A Balfour retractor was placed.  The abdomen is surveyed.  There is thin clear appearing ascites totaling 50-100 cc.  We began by inspecting the colon.  The descending, transverse, and ascending colon are all normal in appearance and to palpation.  The appendix is normal in appearance.  The terminal ileum was identified at the ileocecal valve and traced retrograde.  As we approached the junction between the jejunum and ileum there was an apparent transition point to much larger bowel where it entered a wide defect from an omental containing adhesion.  We lysed this adhesion.  There is no clear incarceration of the small bowel.  There is some faint tiger striping like appearance to the antimesenteric border.  The bowel appears viable.  There is a palpable pulse in the mesentery.  On the mesenteric border of the small bowel, there is a palpable and visible pulse in the small arterial arcades.  The small bowel was then run from the ligament of Treitz all the way antegrade again.  There are no other adhesions or evident points of obstruction.  The bowel was placed back into the abdomen in its anatomic configuration.  There is no twisting of the mesentery.  The abdomen is inspected and hemostasis verified. NG is palpable in the stomach.  The midline fascia is then closed using 2 running #1 PDS sutures.  The fascial closure was palpated and noted to be complete without any gaps.  All sponge, needle, and instrument counts are  reported correct x 2.  Attention is directed at skin closure.  The skin is closed with staples.  The abdomen is washed and dried.   A dressing consisting of a honeycomb was placed over the staples.  She is then awakened from anesthesia, extubated, and transported to the recovery room in satisfactory condition.

## 2022-11-14 NOTE — Progress Notes (Signed)
Attempted to call report to short stay, reporting RN refused report and states she was able to look up the patient.

## 2022-11-14 NOTE — Progress Notes (Signed)
Patient arrived back on unit , denies pain at this time.

## 2022-11-14 NOTE — Anesthesia Procedure Notes (Signed)
Procedure Name: Intubation Date/Time: 11/14/2022 2:54 PM  Performed by: Cheree Ditto, CRNAPre-anesthesia Checklist: Patient identified, Emergency Drugs available, Suction available and Patient being monitored Patient Re-evaluated:Patient Re-evaluated prior to induction Oxygen Delivery Method: Circle system utilized Preoxygenation: Pre-oxygenation with 100% oxygen Induction Type: IV induction, Cricoid Pressure applied and Rapid sequence Laryngoscope Size: Mac and 3 Grade View: Grade I Tube type: Oral Tube size: 7.5 mm Number of attempts: 1 Airway Equipment and Method: Stylet and Oral airway Placement Confirmation: ETT inserted through vocal cords under direct vision, positive ETCO2 and breath sounds checked- equal and bilateral Secured at: 21 cm Tube secured with: Tape Dental Injury: Teeth and Oropharynx as per pre-operative assessment

## 2022-11-15 ENCOUNTER — Encounter (HOSPITAL_COMMUNITY): Payer: Self-pay | Admitting: Surgery

## 2022-11-15 DIAGNOSIS — K56609 Unspecified intestinal obstruction, unspecified as to partial versus complete obstruction: Secondary | ICD-10-CM | POA: Diagnosis not present

## 2022-11-15 LAB — CBC WITH DIFFERENTIAL/PLATELET
Abs Immature Granulocytes: 0.01 10*3/uL (ref 0.00–0.07)
Basophils Absolute: 0 10*3/uL (ref 0.0–0.1)
Basophils Relative: 1 %
Eosinophils Absolute: 0 10*3/uL (ref 0.0–0.5)
Eosinophils Relative: 0 %
HCT: 35 % — ABNORMAL LOW (ref 36.0–46.0)
Hemoglobin: 11.5 g/dL — ABNORMAL LOW (ref 12.0–15.0)
Immature Granulocytes: 0 %
Lymphocytes Relative: 11 %
Lymphs Abs: 0.6 10*3/uL — ABNORMAL LOW (ref 0.7–4.0)
MCH: 31.1 pg (ref 26.0–34.0)
MCHC: 32.9 g/dL (ref 30.0–36.0)
MCV: 94.6 fL (ref 80.0–100.0)
Monocytes Absolute: 0.9 10*3/uL (ref 0.1–1.0)
Monocytes Relative: 18 %
Neutro Abs: 3.8 10*3/uL (ref 1.7–7.7)
Neutrophils Relative %: 70 %
Platelets: 172 10*3/uL (ref 150–400)
RBC: 3.7 MIL/uL — ABNORMAL LOW (ref 3.87–5.11)
RDW: 12 % (ref 11.5–15.5)
WBC: 5.3 10*3/uL (ref 4.0–10.5)
nRBC: 0 % (ref 0.0–0.2)

## 2022-11-15 LAB — RENAL FUNCTION PANEL
Albumin: 2.8 g/dL — ABNORMAL LOW (ref 3.5–5.0)
Anion gap: 11 (ref 5–15)
BUN: 16 mg/dL (ref 8–23)
CO2: 27 mmol/L (ref 22–32)
Calcium: 8.8 mg/dL — ABNORMAL LOW (ref 8.9–10.3)
Chloride: 103 mmol/L (ref 98–111)
Creatinine, Ser: 0.79 mg/dL (ref 0.44–1.00)
GFR, Estimated: >60 mL/min (ref >60)
Glucose, Bld: 121 mg/dL — ABNORMAL HIGH (ref 70–99)
Phosphorus: 3.2 mg/dL (ref 2.5–4.6)
Potassium: 3.2 mmol/L — ABNORMAL LOW (ref 3.5–5.1)
Sodium: 141 mmol/L (ref 135–145)

## 2022-11-15 LAB — GLUCOSE, CAPILLARY
Glucose-Capillary: 105 mg/dL — ABNORMAL HIGH (ref 70–99)
Glucose-Capillary: 111 mg/dL — ABNORMAL HIGH (ref 70–99)
Glucose-Capillary: 112 mg/dL — ABNORMAL HIGH (ref 70–99)

## 2022-11-15 MED ORDER — POTASSIUM CHLORIDE 20 MEQ PO PACK
40.0000 meq | PACK | Freq: Two times a day (BID) | ORAL | Status: DC
  Filled 2022-11-15: qty 2

## 2022-11-15 MED ORDER — POTASSIUM CHLORIDE CRYS ER 20 MEQ PO TBCR: 40.0000 meq | EXTENDED_RELEASE_TABLET | Freq: Two times a day (BID) | ORAL | Status: DC

## 2022-11-15 MED ORDER — POTASSIUM CHLORIDE 2 MEQ/ML IV SOLN
INTRAVENOUS | Status: DC
  Filled 2022-11-15 (×6): qty 1000

## 2022-11-15 NOTE — Progress Notes (Signed)
Patient Catheter removed at this time. Patient tolerated well.

## 2022-11-15 NOTE — TOC Initial Note (Signed)
Transition of Care [facility]) - Initial/Assessment Note    Patient Details  Name: Cheryl Martin MRN: [id number] Date of Birth: 09/22/1930  Transition of Care [facility]) CM/SW Contact:    Janae Bridgeman, RN Phone Number: 11/15/2022, 4:25 PM  Clinical Narrative:                 CM met with the patient at the bedside to discuss TOC needs.  The patient lives with her daughter at the home.  The patient is S/P SBO by Dr. Cliffton Asters with current NGT that will likely be removed tomorrow.  The patient has midline abdominal incision with honeycomb dressing and no drains.  The patient is active with [facility] health and confirmed with [facility] with [facility] - Added HH orders and noted in the discharge instructions.  DME at the home includes RW, raised toilet seat and RW.  The patient will discharge back home with daughter when medically stable in the next few days.  Expected Discharge Plan: Home w Home Health Services Barriers to Discharge: Continued Medical Work up   Patient Goals and CMS Choice Patient states their goals for this hospitalization and ongoing recovery are:: To return home CMS Medicare.gov Compare Post Acute Care list provided to:: Patient Choice offered to / list presented to : Patient [place] ownership interest in [facility].provided to:: Patient    Expected Discharge Plan and Services   Discharge Planning Services: CM Consult Post Acute Care Choice: Home Health Living arrangements for the past 2 months: Single Family Home                                      Prior Living Arrangements/Services Living arrangements for the past 2 months: Single Family Home Lives with:: Adult Children (Lives with daughter, Okey Regal) Patient language and need for interpreter reviewed:: Yes        Need for Family Participation in Patient Care: Yes (Comment)   Current home services: DME (RW, Cane, raised toilet seat at home) Criminal Activity/Legal Involvement  Pertinent to Current Situation/Hospitalization: No - Comment as needed  Activities of Daily Living Home Assistive Devices/Equipment: Cane (specify quad or straight), Walker (specify type), Dentures (specify type), Eyeglasses, Raised toilet seat with rails ADL Screening (condition at time of admission) Patient's cognitive ability adequate to safely complete daily activities?: Yes Is the patient deaf or have difficulty hearing?: No Does the patient have difficulty seeing, even when wearing glasses/contacts?: No Does the patient have difficulty concentrating, remembering, or making decisions?: No Patient able to express need for assistance with ADLs?: Yes Does the patient have difficulty dressing or bathing?: Yes Independently performs ADLs?: Yes (appropriate for developmental age) Does the patient have difficulty walking or climbing stairs?: No Weakness of Legs: None Weakness of Arms/Hands: None  Permission Sought/Granted         Permission granted to share info w AGENCY: [facility] - active with [facility] - RN added  Permission granted to share info w Relationship: daughter, Okey Regal     Emotional Assessment Appearance:: Appears stated age Attitude/Demeanor/Rapport: Gracious Affect (typically observed): Accepting Orientation: : Oriented to Self, Oriented to Place, Oriented to  Time, Oriented to Situation Alcohol / Substance Use: Not Applicable Psych Involvement: No (comment)  Admission diagnosis:  Small bowel obstruction (HCC) [K56.609] SBO (small bowel obstruction) (HCC) [K56.609] Patient Active Problem List   Diagnosis Date Noted   Small  bowel obstruction (HCC) 11/11/2022   S/P lumbar laminectomy 10/11/2022   Dizziness 09/26/2021   Dissociated deviation of eye movements 11/30/2020   Degenerative spondylolisthesis 09/16/2019   Scoliosis deformity of spine 09/16/2019   Bronchiectasis without complication (HCC) 03/02/2019   Multiple pulmonary nodules assoc with bronchiectasis  03/02/2019   Hernia, hiatal 02/17/2019   History of transient ischemic attack (TIA) 01/05/2019   Environmental and seasonal allergies- aside from above sx 10/21/2018   SOB (shortness of breath) on exertion 10/21/2018   Nail abnormalities-  longitudinal nail ridges 06/08/2018   Vegan diet 06/08/2018   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 [place] [address] Phone: [phone] Fax: [phone]     Social Determinants of Health (SDOH) Social History: SDOH Screenings   Food Insecurity: No Food Insecurity (11/12/2022)  Housing: Low Risk  (11/12/2022)  Transportation Needs: No Transportation Needs (11/12/2022)  Utilities: Not At Risk (11/12/2022)  Depression (PHQ2-9): Low Risk  (10/25/2022)  Tobacco Use: Medium Risk (11/15/2022)   SDOH Interventions:     Readmission Risk Interventions    11/15/2022    4:25 PM  Readmission Risk Prevention Plan  Transportation Screening Complete  PCP or Specialist Appt within 5-7 Days Complete  Home Care Screening Complete  Medication Review (RN CM) Complete

## 2022-11-15 NOTE — Progress Notes (Signed)
PROGRESS NOTE   Cheryl Martin  ZOX:[id number] DOB: Apr 21, 1931 DOA: 11/11/2022 PCP: [name], PA  Brief Narrative:   89 year old white female recent L3-L4, L4-L5 hemilaminectomy with foraminotomies status post surgery Dr. [name] Presumed TIA 11/2021 Hypovolemic hyponatremia HLD ?  Urge incontinence followed by urology in [facility] Prior cholecystectomy/hysterectomy-present emergency room/20 08/2022 abdominal pain, N, V, Rx fentanyl- CT scan ABD pelvis = early SBO + dilated small bowel loops pelvis + right sided inguinal hernia  4/22 General Surgery consulted-Gastrografin protocol 4/25 status post ex lap + lysis of adhesions Dr. Cliffton Asters general surgery  Hospital-Problem based course  Partial SBO + adhesiolysis 4/25 Defer management to general surgery-still has NG tube--diet, pain control, mobility etc. as per them  Previous TIA 11/2021 Hold all oral meds-resume asa on discharge  Recent Lumbar surgery Mobilize with therapy services  Mild hypokalemia Change IVF D5 + LR + K40 @ 100 cc/h  Hypovolemic hyponatremia As above  HTN, On as needed metoprolol and hydralazine IV Add clonidine patch 0.1-now is better controlled   DVT prophylaxis: Lovenox Code Status: DNR confirmed Family Communication: None present --updated daughter/26 Disposition:  Status is: Inpatient Remains inpatient appropriate because:   Will need to be able to eat prior to be able to discharge     Subjective:  5/10 ABD pain - Nausea, - vomit still has NG in Overall seems about the same  Objective: Vitals:   11/14/22 1719 11/14/22 2000 11/15/22 0514 11/15/22 0752  BP: 111/60 (!) 135/56 (!) 118/52 (!) 122/55  Pulse: 76 78 71 76  Resp: 18 19 17    Temp: 98 F (36.7 C) 98.5 F (36.9 C) 98.6 F (37 C) 98.9 F (37.2 C)  TempSrc: Oral Oral  Oral  SpO2: 93% 95% 95% 96%  Weight:      Height:        Intake/Output Summary (Last 24 hours) at 11/15/2022 1541 Last data filed at 11/15/2022  0500 Gross per 24 hour  Intake 700 ml  Output 390 ml  Net 310 ml    Filed Weights   11/11/22 1508 11/12/22 1305 11/14/22 1413  Weight: 58 kg 61.2 kg 62 kg    Examination:  EOMI NCAT NG tube in place with dark succus S1-S2 no murmur sinus on exam Chest is clear no wheeze rales rhonchi Abdomen distended no rebound Mepilex in place, quite tender to touch No lower extremity edema Oriented X4 Power grossly normal Euthymic  Data Reviewed: personally reviewed   CBC    Component Value Date/Time   WBC 5.3 11/15/2022 0723   RBC 3.70 (L) 11/15/2022 0723   HGB 11.5 (L) 11/15/2022 0723   HGB 12.7 03/29/2021 0952   HCT 35.0 (L) 11/15/2022 0723   HCT 38.9 03/29/2021 0952   PLT 172 11/15/2022 0723   PLT 187 03/29/2021 0952   MCV 94.6 11/15/2022 0723   MCV 94 03/29/2021 0952   MCH 31.1 11/15/2022 0723   MCHC 32.9 11/15/2022 0723   RDW 12.0 11/15/2022 0723   RDW 12.3 03/29/2021 0952   LYMPHSABS 0.6 (L) 11/15/2022 0723   LYMPHSABS 1.1 03/29/2021 0952   MONOABS 0.9 11/15/2022 0723   EOSABS 0.0 11/15/2022 0723   EOSABS 0.2 03/29/2021 0952   BASOSABS 0.0 11/15/2022 0723   BASOSABS 0.1 03/29/2021 0952      Latest Ref Rng & Units 11/15/2022    7:23 AM 11/14/2022    3:48 AM 11/13/2022    4:36 AM  CMP  Glucose 70 - 99 mg/dL 811  135  113   BUN 8 - 23 mg/dL 16  12  9    Creatinine 0.44 - 1.00 mg/dL 7.82  9.56  2.13   Sodium 135 - 145 mmol/L 141  139  138   Potassium 3.5 - 5.1 mmol/L 3.2  3.3  3.7   Chloride 98 - 111 mmol/L 103  100  103   CO2 22 - 32 mmol/L 27  30  29    Calcium 8.9 - 10.3 mg/dL 8.8  9.2  9.0      Radiology Studies: DG Abd Portable 1V  Result Date: 11/14/2022 CLINICAL DATA:  Small-bowel obstruction. EXAM: PORTABLE ABDOMEN - 1 VIEW COMPARISON:  Radiographs 11/13/2022 and 11/12/2022.  CT 11/11/2022. FINDINGS: 0824 hours. Enteric tube is unchanged, overlying the mid lumbar spine and likely within the mid stomach. Previously administered enteric contrast remains  within moderately dilated small bowel in the pelvis. There is a persistent dilated, air-filled loop of small bowel in the left upper quadrant. No significant colonic contrast identified. There is stool in the right colon. Surgical clips are present in the right upper quadrant. No evidence of extravasated enteric contrast or pneumoperitoneum. Moderate convex left thoracolumbar scoliosis again noted. IMPRESSION: No significant change in findings of small-bowel obstruction. Enteric contrast remains within moderately dilated small bowel loops in the pelvis. No significant colonic contrast identified. Electronically Signed   By: Carey Bullocks M.D.   On: 11/14/2022 08:39   DG Abd Portable 1V-Small Bowel Obstruction Protocol-initial, 8 hr delay  Result Date: 11/13/2022 CLINICAL DATA:  Delayed images after contrast administration. EXAM: PORTABLE ABDOMEN - 1 VIEW COMPARISON:  Previous examination done earlier today FINDINGS: There is dilation of small-bowel loops measuring up to 4 cm in diameter. Oral contrast is still in the lumen of dilated small bowel loops. Oral contrast has not reached the right colon. Tip of NG tube is seen in the stomach. Surgical clips are seen in right upper quadrant. Rotoscoliosis is seen thoracolumbar junction with convexity to the left. IMPRESSION: Oral contrast remains within dilated small bowel loops suggesting partial small bowel obstruction. Electronically Signed   By: Ernie Avena M.D.   On: 11/13/2022 19:26     Scheduled Meds:  cloNIDine  0.1 mg Transdermal Weekly   enoxaparin (LOVENOX) injection  40 mg Subcutaneous Q24H   ondansetron (ZOFRAN) IV  4 mg Intravenous Once   potassium chloride  40 mEq Per Tube BID   Continuous Infusions:  dextrose 5% lactated ringers 100 mL/hr at 11/15/22 0532     LOS: 4 days   Time spent: 66  Rhetta Mura, MD [facility] To contact the attending provider between 7A-7P or the covering provider during after hours  7P-7A, please log into the web site [email or web address] and access using universal [place] password for that web site. If you do not have the password, please call the hospital operator.  11/15/2022, 3:41 PM

## 2022-11-15 NOTE — Evaluation (Signed)
Physical Therapy Evaluation Patient Details Name: Cheryl Martin MRN: [id number] DOB: Jul 06, 1931 Today's Date: 11/15/2022  History of Present Illness  Patient is an 89 y.o. female present to ED with abdominal pain on 11/11/22 s/p exp laparotomy with LOA on 11/15/22 due to SBO. Pt recently hospitalized for L3-4 and L4-5 hemilaminectomy, medial facetectomy, foraminotomies on 10/11/22 due to stenosis. PMH includes: Anemia, OA, HLD, TIA in 11/2021, and CAD.   Clinical Impression  AILED DEFIBAUGH is 89 y.o. female admitted with above HPI and diagnosis. Patient is currently limited by functional impairments below (see PT problem list). Patient lives with her daughter and is independent at baseline. Pt currently requires min guard for bed mobility and Min guard/assist for transfers and gait with RW. Patient will benefit from continued skilled PT interventions to address impairments and progress independence with mobility. Acute PT will follow and progress as able.        Recommendations for follow up therapy are one component of a multi-disciplinary discharge planning process, led by the attending physician.  Recommendations may be updated based on patient status, additional functional criteria and insurance authorization.  Follow Up Recommendations       Assistance Recommended at Discharge Intermittent Supervision/Assistance  Patient can return home with the following  Assist for transportation;Help with stairs or ramp for entrance    Equipment Recommendations None recommended by PT  Recommendations for Other Services       Functional Status Assessment Patient has had a recent decline in their functional status and demonstrates the ability to make significant improvements in function in a reasonable and predictable amount of time.     Precautions / Restrictions Precautions Precautions: Fall Precaution Comments: NGT Restrictions Weight Bearing Restrictions: No      Mobility  Bed  Mobility Overal bed mobility: Needs Assistance Bed Mobility: Rolling, Sidelying to Sit Rolling: Min guard Sidelying to sit: Min guard       General bed mobility comments: pt with good recall for log roll technique and brought bil LE's off EOB together, pt then able to press up trunk in neutral position.    Transfers Overall transfer level: Needs assistance Equipment used: Rolling walker (2 wheels) Transfers: Sit to/from Stand Sit to Stand: Min assist           General transfer comment: min assist to power up from EOB and toilet, cues for hand placement.    Ambulation/Gait Ambulation/Gait assistance: Min assist, Min guard Gait Distance (Feet): 100 Feet Assistive device: Rolling walker (2 wheels) Gait Pattern/deviations: Step-through pattern, Decreased stride length Gait velocity: decr     General Gait Details: min gaurd with intermittent assist to guide walker from obstacles, overall steady pace, no LOB. pt very aware of posture working to maintain upright posture throughout.  Stairs            Wheelchair Mobility    Modified Rankin (Stroke Patients Only)       Balance Overall balance assessment: Needs assistance Sitting-balance support: Feet supported, No upper extremity supported Sitting balance-Leahy Scale: Good     Standing balance support: During functional activity, Reliant on assistive device for balance, Bilateral upper extremity supported Standing balance-Leahy Scale: Fair                               Pertinent Vitals/Pain Pain Assessment Pain Assessment: Faces Pain Location: abdomen with coughing and sit<>stand Pain Intervention(s): Limited activity within patient's tolerance, Monitored during session,  Repositioned    Home Living Family/patient expects to be discharged to:: Private residence Living Arrangements: Children Available Help at Discharge: Family;Available PRN/intermittently Type of Home: House Home Access: Stairs  to enter Entrance Stairs-Rails: Left Entrance Stairs-Number of Steps: 3   Home Layout: One level Home Equipment: Cane - single point Additional Comments: independent    Prior Function Prior Level of Function : Independent/Modified Independent;Driving             Mobility Comments: independent ADLs Comments: independent, drives occasionally, but only short distances     Hand Dominance   Dominant Hand: Right    Extremity/Trunk Assessment   Upper Extremity Assessment Upper Extremity Assessment: Overall WFL for tasks assessed    Lower Extremity Assessment Lower Extremity Assessment: Overall WFL for tasks assessed    Cervical / Trunk Assessment Cervical / Trunk Assessment: Normal;Other exceptions;Back Surgery Cervical / Trunk Exceptions: recent back surgery  Communication   Communication: No difficulties  Cognition Arousal/Alertness: Awake/alert Behavior During Therapy: WFL for tasks assessed/performed Overall Cognitive Status: Within Functional Limits for tasks assessed                                          General Comments      Exercises     Assessment/Plan    PT Assessment Patient needs continued PT services  PT Problem List Decreased strength;Decreased activity tolerance;Decreased balance;Decreased mobility;Decreased knowledge of use of DME;Decreased safety awareness;Decreased knowledge of precautions;Pain       PT Treatment Interventions DME instruction;Gait training;Stair training;Functional mobility training;Therapeutic activities;Therapeutic exercise;Balance training;Patient/family education    PT Goals (Current goals can be found in the Care Plan section)  Acute Rehab PT Goals Patient Stated Goal: regain independence and get back to normal activities PT Goal Formulation: With patient Time For Goal Achievement: 11/29/22 Potential to Achieve Goals: Good    Frequency Min 3X/week     Co-evaluation               AM-PAC  PT "6 Clicks" Mobility  Outcome Measure Help needed turning from your back to your side while in a flat bed without using bedrails?: A Little Help needed moving from lying on your back to sitting on the side of a flat bed without using bedrails?: A Little Help needed moving to and from a bed to a chair (including a wheelchair)?: A Little Help needed standing up from a chair using your arms (e.g., wheelchair or bedside chair)?: A Little Help needed to walk in hospital room?: A Little Help needed climbing 3-5 steps with a railing? : A Little 6 Click Score: 18    End of Session Equipment Utilized During Treatment: Gait belt Activity Tolerance: Patient tolerated treatment well Patient left: in chair;with call bell/phone within reach;with chair alarm set Nurse Communication: Mobility status PT Visit Diagnosis: Other abnormalities of gait and mobility (R26.89);Muscle weakness (generalized) (M62.81);Difficulty in walking, not elsewhere classified (R26.2)    Time: 1501-1520 PT Time Calculation (min) (ACUTE ONLY): 19 min   Charges:   PT Evaluation $PT Eval Low Complexity: 1 Low          Wynn Maudlin, DPT Acute Rehabilitation Services Office [phone]  11/15/22 4:14 PM

## 2022-11-15 NOTE — Anesthesia Postprocedure Evaluation (Signed)
Anesthesia Post Note  Patient: Cheryl Martin  Procedure(s) Performed: EXPLORATORY LAPAROTOMY, LYSIS OF ADHESIONS     Patient location during evaluation: PACU Anesthesia Type: General Level of consciousness: awake and patient cooperative Pain management: pain level controlled Vital Signs Assessment: post-procedure vital signs reviewed and stable Respiratory status: spontaneous breathing, nonlabored ventilation, respiratory function stable and patient connected to nasal cannula oxygen Cardiovascular status: blood pressure returned to baseline and stable Postop Assessment: no apparent nausea or vomiting Anesthetic complications: no   No notable events documented.  Last Vitals:  Vitals:   11/15/22 0514 11/15/22 0752  BP: (!) 118/52 (!) 122/55  Pulse: 71 76  Resp: 17   Temp: 37 C 37.2 C  SpO2: 95% 96%    Last Pain:  Vitals:   11/15/22 0752  TempSrc: Oral  PainSc:                  Ahmadou Bolz

## 2022-11-15 NOTE — Progress Notes (Signed)
  Subjective No acute events. Feeling reasonably well. No flatus/BM. NG in place, bilious effluent  Objective: Vital signs in last 24 hours: Temp:  [97.9 F (36.6 C)-98.9 F (37.2 C)] 98.9 F (37.2 C) (04/26 0752) Pulse Rate:  [65-81] 76 (04/26 0752) Resp:  [10-21] 17 (04/26 0514) BP: (111-163)/(52-92) 122/55 (04/26 0752) SpO2:  [93 %-98 %] 96 % (04/26 0752) Weight:  [62 kg] 62 kg (04/25 1413) Last BM Date : 11/11/22  Intake/Output from previous day: 04/25 0701 - 04/26 0700 In: 1050 [I.V.:700; IV Piggyback:350] Out: 640 [Urine:30; Emesis/NG output:590; Blood:20] Intake/Output this shift: No intake/output data recorded.  Gen: NAD, comfortable CV: RRR Pulm: Normal work of breathing Abd: Soft, mildly distended, appropriately tender. No rebound nor guardign Ext: SCDs in place  Lab Results: CBC  Recent Labs    11/14/22 0348 11/15/22 0723  WBC 6.8 5.3  HGB 12.6 11.5*  HCT 36.3 35.0*  PLT 177 172   BMET Recent Labs    11/14/22 0348 11/15/22 0723  NA 139 141  K 3.3* 3.2*  CL 100 103  CO2 30 27  GLUCOSE 135* 121*  BUN 12 16  CREATININE 0.74 0.79  CALCIUM 9.2 8.8*   PT/INR No results for input(s): "LABPROT", "INR" in the last 72 hours. ABG No results for input(s): "PHART", "HCO3" in the last 72 hours.  Invalid input(s): "PCO2", "PO2"  Studies/Results:  Anti-infectives: Anti-infectives (From admission, onward)    Start     Dose/Rate Route Frequency Ordered Stop   11/14/22 1300  cefoTEtan (CEFOTAN) 2 g in sodium chloride 0.9 % 100 mL IVPB       Note to Pharmacy: Pharmacy may adjust dose strength for optimal dosing.   Send with patient on call to the OR.  Anesthesia to complete antibiotic administration <82min prior to incision per [facility].   2 g 200 mL/hr over 30 Minutes Intravenous On call to O.R. 11/14/22 0914 11/14/22 1526        Assessment/Plan: Patient Active Problem List   Diagnosis Date Noted   Small bowel obstruction (HCC) 11/11/2022    S/P lumbar laminectomy 10/11/2022   Dizziness 09/26/2021   Dissociated deviation of eye movements 11/30/2020   Degenerative spondylolisthesis 09/16/2019   Scoliosis deformity of spine 09/16/2019   Bronchiectasis without complication (HCC) 03/02/2019   Multiple pulmonary nodules assoc with bronchiectasis 03/02/2019   Hernia, hiatal 02/17/2019   History of transient ischemic attack (TIA) 01/05/2019   Environmental and seasonal allergies- aside from above sx 10/21/2018   SOB (shortness of breath) on exertion 10/21/2018   Nail abnormalities-  longitudinal nail ridges 06/08/2018   Vegan diet 06/08/2018   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   Chronic constipation 06/03/2017   Urinary incontinence 05/24/2016   OA (osteoarthritis) of knee 04/15/2016   Chronic pain of left knee 04/11/2016   Senile solar keratosis 03/14/2016   Peripheral edema- bilateral lower extremities 03/14/2016   ETD- Sx of L ear 03/14/2016   Hyperlipidemia 02/24/2016   Vitamin D deficiency 02/22/2016   ADD (attention deficit disorder) 02/19/2011   Decreased hearing 02/19/2011   s/p Procedure(s): EXPLORATORY LAPAROTOMY, LYSIS OF ADHESIONS 11/14/2022 for SBO  POD#1  Reviewed her procedure, findings and plans moving forward Continue NG today Ambulate 5x/day as able D/C Foley MIVF for support   LOS: 4 days   Marin Olp, MD [facility]

## 2022-11-16 DIAGNOSIS — K56609 Unspecified intestinal obstruction, unspecified as to partial versus complete obstruction: Secondary | ICD-10-CM | POA: Diagnosis not present

## 2022-11-16 LAB — GLUCOSE, CAPILLARY
Glucose-Capillary: 122 mg/dL — ABNORMAL HIGH (ref 70–99)
Glucose-Capillary: 96 mg/dL (ref 70–99)
Glucose-Capillary: 99 mg/dL (ref 70–99)
Glucose-Capillary: 99 mg/dL (ref 70–99)

## 2022-11-16 LAB — CBC WITH DIFFERENTIAL/PLATELET
Abs Immature Granulocytes: 0.01 10*3/uL (ref 0.00–0.07)
Basophils Absolute: 0 10*3/uL (ref 0.0–0.1)
Basophils Relative: 0 %
Eosinophils Absolute: 0 10*3/uL (ref 0.0–0.5)
Eosinophils Relative: 1 %
HCT: 32.5 % — ABNORMAL LOW (ref 36.0–46.0)
Hemoglobin: 10.7 g/dL — ABNORMAL LOW (ref 12.0–15.0)
Immature Granulocytes: 0 %
Lymphocytes Relative: 18 %
Lymphs Abs: 0.7 10*3/uL (ref 0.7–4.0)
MCH: 31.5 pg (ref 26.0–34.0)
MCHC: 32.9 g/dL (ref 30.0–36.0)
MCV: 95.6 fL (ref 80.0–100.0)
Monocytes Absolute: 0.7 10*3/uL (ref 0.1–1.0)
Monocytes Relative: 17 %
Neutro Abs: 2.5 10*3/uL (ref 1.7–7.7)
Neutrophils Relative %: 64 %
Platelets: 157 10*3/uL (ref 150–400)
RBC: 3.4 MIL/uL — ABNORMAL LOW (ref 3.87–5.11)
RDW: 12 % (ref 11.5–15.5)
WBC: 4 10*3/uL (ref 4.0–10.5)
nRBC: 0 % (ref 0.0–0.2)

## 2022-11-16 LAB — RENAL FUNCTION PANEL
Albumin: 2.6 g/dL — ABNORMAL LOW (ref 3.5–5.0)
Anion gap: 4 — ABNORMAL LOW (ref 5–15)
BUN: 16 mg/dL (ref 8–23)
CO2: 29 mmol/L (ref 22–32)
Calcium: 8.5 mg/dL — ABNORMAL LOW (ref 8.9–10.3)
Chloride: 105 mmol/L (ref 98–111)
Creatinine, Ser: 0.71 mg/dL (ref 0.44–1.00)
GFR, Estimated: >60 mL/min (ref >60)
Glucose, Bld: 111 mg/dL — ABNORMAL HIGH (ref 70–99)
Phosphorus: 1.8 mg/dL — ABNORMAL LOW (ref 2.5–4.6)
Potassium: 3.4 mmol/L — ABNORMAL LOW (ref 3.5–5.1)
Sodium: 138 mmol/L (ref 135–145)

## 2022-11-16 MED ORDER — POTASSIUM CHLORIDE 10 MEQ/100ML IV SOLN
10.0000 meq | INTRAVENOUS | Status: AC
  Administered 2022-11-16 (×2): 10 meq via INTRAVENOUS
  Filled 2022-11-16 (×2): qty 100

## 2022-11-16 MED ORDER — METHOCARBAMOL 1000 MG/10ML IJ SOLN
500.0000 mg | Freq: Three times a day (TID) | INTRAVENOUS | Status: DC | PRN
  Filled 2022-11-16: qty 5

## 2022-11-16 NOTE — Progress Notes (Signed)
PROGRESS NOTE   Cheryl Martin  YNW:[id number] DOB: Sep 01, 1930 DOA: 11/11/2022 PCP: [name], PA  Brief Narrative:   89 year old white female recent L3-L4, L4-L5 hemilaminectomy with foraminotomies status post surgery Dr. [name] Presumed TIA 11/2021 Hypovolemic hyponatremia HLD ?  Urge incontinence followed by urology in [facility] Prior cholecystectomy/hysterectomy-present emergency room/20 08/2022 abdominal pain, N, V, Rx fentanyl- CT scan ABD pelvis = early SBO + dilated small bowel loops pelvis + right sided inguinal hernia  4/22 General Surgery consulted-Gastrografin protocol 4/25 status post ex lap + lysis of adhesions Dr. Cliffton Asters general surgery  Hospital-Problem based course  Partial SBO + adhesiolysis 4/25 Defer management to general surgery-still has NG tube  Previous TIA 11/2021 Hold all oral meds-resume asa on discharge  Recent Lumbar surgery Mobilize with therapy services  Mild hypokalemia Change IVF D5 + LR + K40 @ 75 cc/h  Hypovolemic hyponatremia As above  HTN, On as needed metoprolol and hydralazine IV Add clonidine patch 0.1-now is better controlled   DVT prophylaxis: Lovenox Code Status: DNR confirmed Family Communication: None present  Disposition:  Status is: Inpatient Remains inpatient appropriate because:   Will need to be able to eat prior to be able to discharge     Subjective:  Awake coherent no distress Pain is manageable and abdomen is more soft No flatus no stool no unilateral weakness Has been out of bed   Objective: Vitals:   11/15/22 0752 11/16/22 0427 11/16/22 0849 11/16/22 1616  BP: (!) 122/55 (!) 127/52 130/60 (!) 140/57  Pulse: 76 70 79 75  Resp:  17 16 15   Temp: 98.9 F (37.2 C) 98.6 F (37 C) 99.2 F (37.3 C) 98.5 F (36.9 C)  TempSrc: Oral  Oral Oral  SpO2: 96% 98% 97% 98%  Weight:      Height:        Intake/Output Summary (Last 24 hours) at 11/16/2022 1754 Last data filed at 11/16/2022 0525 Gross  per 24 hour  Intake 838.78 ml  Output 100 ml  Net 738.78 ml    Filed Weights   11/11/22 1508 11/12/22 1305 11/14/22 1413  Weight: 58 kg 61.2 kg 62 kg    Examination:  EOMI NCAT NG tube in place with dark succus S1-S2 no murmur sinus on exam Chest is clear no wheeze rales rhonchi Abdomen Mepilex in place, less tender and distended compared to prior   Data Reviewed: personally reviewed   CBC    Component Value Date/Time   WBC 4.0 11/16/2022 0357   RBC 3.40 (L) 11/16/2022 0357   HGB 10.7 (L) 11/16/2022 0357   HGB 12.7 03/29/2021 0952   HCT 32.5 (L) 11/16/2022 0357   HCT 38.9 03/29/2021 0952   PLT 157 11/16/2022 0357   PLT 187 03/29/2021 0952   MCV 95.6 11/16/2022 0357   MCV 94 03/29/2021 0952   MCH 31.5 11/16/2022 0357   MCHC 32.9 11/16/2022 0357   RDW 12.0 11/16/2022 0357   RDW 12.3 03/29/2021 0952   LYMPHSABS 0.7 11/16/2022 0357   LYMPHSABS 1.1 03/29/2021 0952   MONOABS 0.7 11/16/2022 0357   EOSABS 0.0 11/16/2022 0357   EOSABS 0.2 03/29/2021 0952   BASOSABS 0.0 11/16/2022 0357   BASOSABS 0.1 03/29/2021 0952      Latest Ref Rng & Units 11/16/2022    3:57 AM 11/15/2022    7:23 AM 11/14/2022    3:48 AM  CMP  Glucose 70 - 99 mg/dL 657  846  962   BUN 8 - 23  mg/dL 16  16  12    Creatinine 0.44 - 1.00 mg/dL 1.61  0.96  0.45   Sodium 135 - 145 mmol/L 138  141  139   Potassium 3.5 - 5.1 mmol/L 3.4  3.2  3.3   Chloride 98 - 111 mmol/L 105  103  100   CO2 22 - 32 mmol/L 29  27  30    Calcium 8.9 - 10.3 mg/dL 8.5  8.8  9.2      Radiology Studies: No results found.   Scheduled Meds:  cloNIDine  0.1 mg Transdermal Weekly   enoxaparin (LOVENOX) injection  40 mg Subcutaneous Q24H   ondansetron (ZOFRAN) IV  4 mg Intravenous Once   Continuous Infusions:  dextrose 5% lactated ringers 1,000 mL with potassium chloride 40 mEq infusion 75 mL/hr at 11/16/22 0711   methocarbamol (ROBAXIN) IV       LOS: 5 days   Time spent: 30  Rhetta Mura, MD [facility] To contact the attending provider between 7A-7P or the covering provider during after hours 7P-7A, please log into the web site [email or web address] and access using universal [place] password for that web site. If you do not have the password, please call the hospital operator.  11/16/2022, 5:54 PM

## 2022-11-16 NOTE — Progress Notes (Addendum)
Patient ID: Cheryl Martin, female   DOB: 15-Oct-1930, 89 y.o.   MRN: [id number] 2 Days Post-Op    Subjective: While there eis a BM recorded patient reports no BM and no flatus, C/O some back pain and L toe pain ROS negative except as listed above. Objective: Vital signs in last 24 hours: Temp:  [98.6 F (37 C)-99.2 F (37.3 C)] 99.2 F (37.3 C) (04/27 0849) Pulse Rate:  [70-79] 79 (04/27 0849) Resp:  [16-17] 16 (04/27 0849) BP: (127-130)/(52-60) 130/60 (04/27 0849) SpO2:  [97 %-98 %] 97 % (04/27 0849) Last BM Date : 11/11/22  Intake/Output from previous day: 04/26 0701 - 04/27 0700 In: 838.8 [I.V.:838.8] Out: 100 [Emesis/NG output:100] Intake/Output this shift: No intake/output data recorded.  General appearance: alert and cooperative GI: mild dist, dressing CDI, soft, NGT working Extremities: tender L toes without obv FX  Lab Results: CBC  Recent Labs    11/15/22 0723 11/16/22 0357  WBC 5.3 4.0  HGB 11.5* 10.7*  HCT 35.0* 32.5*  PLT 172 157   BMET Recent Labs    11/15/22 0723 11/16/22 0357  NA 141 138  K 3.2* 3.4*  CL 103 105  CO2 27 29  GLUCOSE 121* 111*  BUN 16 16  CREATININE 0.79 0.71  CALCIUM 8.8* 8.5*   PT/INR No results for input(s): "LABPROT", "INR" in the last 72 hours. ABG No results for input(s): "PHART", "HCO3" in the last 72 hours.  Invalid input(s): "PCO2", "PO2"  Studies/Results: No results found.  Anti-infectives: Anti-infectives (From admission, onward)    Start     Dose/Rate Route Frequency Ordered Stop   11/14/22 1300  cefoTEtan (CEFOTAN) 2 g in sodium chloride 0.9 % 100 mL IVPB       Note to Pharmacy: Pharmacy may adjust dose strength for optimal dosing.   Send with patient on call to the OR.  Anesthesia to complete antibiotic administration <61min prior to incision per [facility].   2 g 200 mL/hr over 30 Minutes Intravenous On call to O.R. 11/14/22 0914 11/14/22 1526       Assessment/Plan: EXPLORATORY LAPAROTOMY,  LYSIS OF ADHESIONS 11/14/2022 for SBO  POD#2  AROBF - continue NGT today, allow ice and water Back pain - add robaxin Mild hypokalemia - replete  LOS: 5 days    Violeta Gelinas, MD, MPH, FACS Trauma & General Surgery Use [email or web address] to contact on call provider  11/16/2022

## 2022-11-17 DIAGNOSIS — K56609 Unspecified intestinal obstruction, unspecified as to partial versus complete obstruction: Secondary | ICD-10-CM | POA: Diagnosis not present

## 2022-11-17 LAB — BASIC METABOLIC PANEL
Anion gap: 10 (ref 5–15)
BUN: 12 mg/dL (ref 8–23)
CO2: 22 mmol/L (ref 22–32)
Calcium: 8.3 mg/dL — ABNORMAL LOW (ref 8.9–10.3)
Chloride: 101 mmol/L (ref 98–111)
Creatinine, Ser: 0.57 mg/dL (ref 0.44–1.00)
GFR, Estimated: >60 mL/min (ref >60)
Glucose, Bld: 108 mg/dL — ABNORMAL HIGH (ref 70–99)
Potassium: 3.6 mmol/L (ref 3.5–5.1)
Sodium: 133 mmol/L — ABNORMAL LOW (ref 135–145)

## 2022-11-17 LAB — CBC
HCT: 30 % — ABNORMAL LOW (ref 36.0–46.0)
Hemoglobin: 10.3 g/dL — ABNORMAL LOW (ref 12.0–15.0)
MCH: 31.8 pg (ref 26.0–34.0)
MCHC: 34.3 g/dL (ref 30.0–36.0)
MCV: 92.6 fL (ref 80.0–100.0)
Platelets: 135 10*3/uL — ABNORMAL LOW (ref 150–400)
RBC: 3.24 MIL/uL — ABNORMAL LOW (ref 3.87–5.11)
RDW: 11.7 % (ref 11.5–15.5)
WBC: 5.9 10*3/uL (ref 4.0–10.5)
nRBC: 0 % (ref 0.0–0.2)

## 2022-11-17 LAB — GLUCOSE, CAPILLARY
Glucose-Capillary: 91 mg/dL (ref 70–99)
Glucose-Capillary: 92 mg/dL (ref 70–99)
Glucose-Capillary: 92 mg/dL (ref 70–99)

## 2022-11-17 MED ORDER — DEXTROSE IN LACTATED RINGERS 5 % IV SOLN: INTRAVENOUS | Status: DC

## 2022-11-17 MED ORDER — BISACODYL 10 MG RE SUPP: 10.0000 mg | Freq: Every day | RECTAL | Status: DC | PRN

## 2022-11-17 MED ORDER — SENNOSIDES-DOCUSATE SODIUM 8.6-50 MG PO TABS
1.0000 | ORAL_TABLET | Freq: Every evening | ORAL | Status: DC | PRN
  Administered 2022-11-20: 1
  Filled 2022-11-17: qty 1

## 2022-11-17 MED ORDER — ACETAMINOPHEN 325 MG PO TABS
650.0000 mg | ORAL_TABLET | Freq: Four times a day (QID) | ORAL | Status: DC | PRN
  Administered 2022-11-17: 650 mg
  Filled 2022-11-17 (×2): qty 2

## 2022-11-17 MED ORDER — TRAMADOL HCL 50 MG PO TABS
50.0000 mg | ORAL_TABLET | Freq: Four times a day (QID) | ORAL | Status: DC | PRN
  Administered 2022-11-19: 50 mg
  Filled 2022-11-17: qty 1

## 2022-11-17 MED ORDER — ONDANSETRON HCL 4 MG PO TABS: 4.0000 mg | ORAL_TABLET | Freq: Four times a day (QID) | ORAL | Status: DC | PRN

## 2022-11-17 MED ORDER — GUAIFENESIN 100 MG/5ML PO LIQD: 5.0000 mL | ORAL | Status: DC | PRN

## 2022-11-17 MED ORDER — KETOROLAC TROMETHAMINE 15 MG/ML IJ SOLN
15.0000 mg | Freq: Once | INTRAMUSCULAR | Status: AC
  Administered 2022-11-17: 15 mg via INTRAVENOUS
  Filled 2022-11-17: qty 1

## 2022-11-17 MED ORDER — ONDANSETRON HCL 4 MG/2ML IJ SOLN
4.0000 mg | Freq: Four times a day (QID) | INTRAMUSCULAR | Status: DC | PRN
  Administered 2022-11-19: 4 mg via INTRAVENOUS
  Filled 2022-11-17 (×2): qty 2

## 2022-11-17 MED ORDER — TRAZODONE HCL 50 MG PO TABS: 50.0000 mg | ORAL_TABLET | Freq: Every evening | ORAL | Status: DC | PRN

## 2022-11-17 NOTE — Progress Notes (Signed)
Patient ID: Cheryl Martin, female   DOB: 1930-08-20, 89 y.o.   MRN: [id number] 3 Days Post-Op    Subjective: No nausea/vomiting.  Some pain around incision.  No flatus or bowel movements.  Normally quite constipated.  ROS negative except as listed above. Objective: Vital signs in last 24 hours: Temp:  [98.4 F (36.9 C)-99.2 F (37.3 C)] 99.1 F (37.3 C) (04/28 0825) Pulse Rate:  [71-79] 74 (04/28 0825) Resp:  [15-18] 18 (04/28 0825) BP: (127-146)/(55-62) 146/62 (04/28 0825) SpO2:  [96 %-98 %] 98 % (04/28 0825) Last BM Date : 11/11/22  Intake/Output from previous day: 04/27 0701 - 04/28 0700 In: -  Out: 450 [Emesis/NG output:450] Intake/Output this shift: No intake/output data recorded.  General appearance: alert and cooperative GI: mild dist, dressing CDI, soft, NGT working Extremities: tender L toes without obv FX  Lab Results: CBC  Recent Labs    11/16/22 0357 11/17/22 0311  WBC 4.0 5.9  HGB 10.7* 10.3*  HCT 32.5* 30.0*  PLT 157 135*    BMET Recent Labs    11/16/22 0357 11/17/22 0311  NA 138 133*  K 3.4* 3.6  CL 105 101  CO2 29 22  GLUCOSE 111* 108*  BUN 16 12  CREATININE 0.71 0.57  CALCIUM 8.5* 8.3*    PT/INR No results for input(s): "LABPROT", "INR" in the last 72 hours. ABG No results for input(s): "PHART", "HCO3" in the last 72 hours.  Invalid input(s): "PCO2", "PO2"  Studies/Results: No results found.  Anti-infectives: Anti-infectives (From admission, onward)    Start     Dose/Rate Route Frequency Ordered Stop   11/14/22 1300  cefoTEtan (CEFOTAN) 2 g in sodium chloride 0.9 % 100 mL IVPB       Note to Pharmacy: Pharmacy may adjust dose strength for optimal dosing.   Send with patient on call to the OR.  Anesthesia to complete antibiotic administration <37min prior to incision per [facility].   2 g 200 mL/hr over 30 Minutes Intravenous On call to O.R. 11/14/22 0914 11/14/22 1526       Assessment/Plan: EXPLORATORY LAPAROTOMY,  LYSIS OF ADHESIONS 11/14/2022 for SBO  POD#3  AROBF - Try NG clamp trial today Suppository Back pain - robaxin   LOS: 6 days    Quentin Ore, MD   Use [email or web address] to contact on call provider  11/17/2022

## 2022-11-17 NOTE — Progress Notes (Addendum)
Physical Therapy Treatment Patient Details Name: Cheryl Martin MRN: [id number] DOB: January 04, 1931 Today's Date: 11/17/2022   History of Present Illness Pt is an 89 y.o. female admitted 11/11/22 with abdominal pain; workup for SBO. S/p ex lap, lysis of adhesions on 4/26. Of note, recently s/p L3-L4, L4-L5 hemilaminectomy with foraminotomies on 10/11/22. Other PMH includes CAD, TIA, HLD, OA, anemia.   PT Comments    Pt progressing with mobility. Pt demonstrates improving ability to move without assist; able to transfer, ambulate with RW and perform self-care tasks at supervision-level, increased time and effort. Pt remains limited by generalized weakness, decreased activity tolerance, and impaired balance strategies/postural reactions. Continue to recommend HHPT to maximize functional mobility and independence upon return home.    Recommendations for follow up therapy are one component of a multi-disciplinary discharge planning process, led by the attending physician.  Recommendations may be updated based on patient status, additional functional criteria and insurance authorization.      Assistance Recommended at Discharge Intermittent Supervision/Assistance  Patient can return home with the following Assist for transportation;Help with stairs or ramp for entrance   Equipment Recommendations  None recommended by PT (owns necessary DME)    Recommendations for Other Services  Occupational Therapy     Precautions / Restrictions Precautions Precautions: Fall;Other (comment) Precaution Comments: NGT (off suction); urinary frequency Restrictions Weight Bearing Restrictions: No     Mobility  Bed Mobility Overal bed mobility: Modified Independent Bed Mobility: Rolling, Sidelying to Sit Rolling: Modified independent (Device/Increase time) Sidelying to sit: Modified independent (Device/Increase time)       General bed mobility comments: good recall of log roll without cues; mod indep from  [facility] slightly elevated, increased time and effort    Transfers Overall transfer level: Needs assistance Equipment used: Rolling walker (2 wheels) Transfers: Sit to/from Stand Sit to Stand: Min guard, Supervision           General transfer comment: increased time and effort preparing to stand, initial min guard for balance from EOB to RW; additional sit<>stands from low toilet height (with grab bar) and recliner at supervision-level    Ambulation/Gait Ambulation/Gait assistance: Min guard, Supervision Gait Distance (Feet): 40 Feet Assistive device: Rolling walker (2 wheels) Gait Pattern/deviations: Step-through pattern, Decreased stride length, Trunk flexed Gait velocity: Decreased     General Gait Details: slow, guarded gait with RW and intermittent min guard for balance, progressing to supervision for safety   Stairs             Wheelchair Mobility    Modified Rankin (Stroke Patients Only)       Balance Overall balance assessment: Needs assistance Sitting-balance support: Feet supported, No upper extremity supported Sitting balance-Leahy Scale: Good Sitting balance - Comments: indep with toileting   Standing balance support: During functional activity, Reliant on assistive device for balance, Bilateral upper extremity supported, Single extremity supported Standing balance-Leahy Scale: Fair Standing balance comment: can static stand without UE support to wash hands at sink; static and dynamic stability improved with RW                            Cognition Arousal/Alertness: Awake/alert Behavior During Therapy: WFL for tasks assessed/performed Overall Cognitive Status: Within Functional Limits for tasks assessed  Exercises      General Comments General comments (skin integrity, edema, etc.): pt still unable to have BM, requesting suppository which she uses baseline; NT/RN aware. further  educ re: role of acute PT, POC, activity recommendations, importance of more frequent OOB and upright activity, DME and discharge recommendations. pt reports her daughter is currently recovering from back sx      Pertinent Vitals/Pain Pain Assessment Pain Assessment: Faces Faces Pain Scale: Hurts a little bit Pain Location: abdomen, NGT insertion Pain Descriptors / Indicators: Discomfort, Grimacing Pain Intervention(s): Monitored during session    Home Living                          Prior Function            PT Goals (current goals can now be found in the care plan section) Progress towards PT goals: Progressing toward goals    Frequency    Min 3X/week      PT Plan Current plan remains appropriate    Co-evaluation              AM-PAC PT "6 Clicks" Mobility   Outcome Measure  Help needed turning from your back to your side while in a flat bed without using bedrails?: None Help needed moving from lying on your back to sitting on the side of a flat bed without using bedrails?: None Help needed moving to and from a bed to a chair (including a wheelchair)?: A Little Help needed standing up from a chair using your arms (e.g., wheelchair or bedside chair)?: A Little Help needed to walk in hospital room?: A Little Help needed climbing 3-5 steps with a railing? : A Little 6 Click Score: 20    End of Session   Activity Tolerance: Patient tolerated treatment well Patient left: in chair;with call bell/phone within reach Nurse Communication: Mobility status PT Visit Diagnosis: Other abnormalities of gait and mobility (R26.89);Muscle weakness (generalized) (M62.81);Difficulty in walking, not elsewhere classified (R26.2)     Time: 1610-9604 PT Time Calculation (min) (ACUTE ONLY): 27 min  Charges:  $Therapeutic Activity: 23-37 mins                      Ina Homes, PT, DPT Acute Rehabilitation Services  Personal: Secure Chat Rehab Office:  [phone]  Malachy Chamber 11/17/2022, 1:44 PM

## 2022-11-17 NOTE — Progress Notes (Signed)
PROGRESS NOTE   Cheryl Martin  ZOX:[id number] DOB: February 23, 1931 DOA: 11/11/2022 PCP: [name], PA  Brief Narrative:   89 year old white female recent L3-L4, L4-L5 hemilaminectomy with foraminotomies status post surgery Dr. [name] Presumed TIA 11/2021 Hypovolemic hyponatremia HLD ?  Urge incontinence followed by urology in [facility] Prior cholecystectomy/hysterectomy-present emergency room/20 08/2022 abdominal pain, N, V, Rx fentanyl- CT scan ABD pelvis = early SBO + dilated small bowel loops pelvis + right sided inguinal hernia  4/22 General Surgery consulted-Gastrografin protocol 4/25 status post ex lap + lysis of adhesions Dr. Cliffton Asters general surgery  Hospital-Problem based course  Partial SBO + adhesiolysis 4/25 Defer management to general surgery-still has NG tube--looks like capping trials-suppository- liquid diet and potential removal of tube 1400--11/17/2022  Previous TIA 11/2021 Hold oral meds-resume asa on discharge  Recent Lumbar surgery Mobilize with therapy services-requires only intermittent supervision and only needed minimum guard  Mild hypokalemia Change IVF D5/LR + K40 @ 50 cc/h  Hypovolemic hyponatremia As above  HTN, On as needed metoprolol and hydralazine IV Add clonidine patch 0.1-now is better controlled   DVT prophylaxis: Lovenox Code Status: DNR confirmed Family Communication: None present  Disposition:  Status is: Inpatient Remains inpatient appropriate because:   Will need to be able to eat prior to be able to discharge Therapy eval 4/28 recommending home health PT     Subjective:  Some rumbling in her belly but no flatus no stool Tolerating sips NG appears  capped   Objective: Vitals:   11/16/22 1616 11/16/22 2046 11/17/22 0356 11/17/22 0825  BP: (!) 140/57 (!) 130/55 (!) 127/58 (!) 146/62  Pulse: 75 71 72 74  Resp: 15 16 18 18   Temp: 98.5 F (36.9 C) 98.4 F (36.9 C) 98.6 F (37 C) 99.1 F (37.3 C)  TempSrc: Oral  Oral Oral   SpO2: 98% 96% 97% 98%  Weight:      Height:        Intake/Output Summary (Last 24 hours) at 11/17/2022 1035 Last data filed at 11/17/2022 0400 Gross per 24 hour  Intake --  Output 450 ml  Net -450 ml    Filed Weights   11/11/22 1508 11/12/22 1305 11/14/22 1413  Weight: 58 kg 61.2 kg 62 kg    Examination:  EOMI NCAT NG tube seems to be capped S1-S2 no murmur sinus on exam Chest is clear no wheeze rales rhonchi Abdomen Mepilex in place, less tender and distended compared to prior, no rebound Bowel Sounds distant, sparse No edema  Data Reviewed: personally reviewed   CBC    Component Value Date/Time   WBC 5.9 11/17/2022 0311   RBC 3.24 (L) 11/17/2022 0311   HGB 10.3 (L) 11/17/2022 0311   HGB 12.7 03/29/2021 0952   HCT 30.0 (L) 11/17/2022 0311   HCT 38.9 03/29/2021 0952   PLT 135 (L) 11/17/2022 0311   PLT 187 03/29/2021 0952   MCV 92.6 11/17/2022 0311   MCV 94 03/29/2021 0952   MCH 31.8 11/17/2022 0311   MCHC 34.3 11/17/2022 0311   RDW 11.7 11/17/2022 0311   RDW 12.3 03/29/2021 0952   LYMPHSABS 0.7 11/16/2022 0357   LYMPHSABS 1.1 03/29/2021 0952   MONOABS 0.7 11/16/2022 0357   EOSABS 0.0 11/16/2022 0357   EOSABS 0.2 03/29/2021 0952   BASOSABS 0.0 11/16/2022 0357   BASOSABS 0.1 03/29/2021 0952      Latest Ref Rng & Units 11/17/2022    3:11 AM 11/16/2022    3:57 AM 11/15/2022  7:23 AM  CMP  Glucose 70 - 99 mg/dL 161  096  045   BUN 8 - 23 mg/dL 12  16  16    Creatinine 0.44 - 1.00 mg/dL 4.09  8.11  9.14   Sodium 135 - 145 mmol/L 133  138  141   Potassium 3.5 - 5.1 mmol/L 3.6  3.4  3.2   Chloride 98 - 111 mmol/L 101  105  103   CO2 22 - 32 mmol/L 22  29  27    Calcium 8.9 - 10.3 mg/dL 8.3  8.5  8.8      Radiology Studies: No results found.   Scheduled Meds:  cloNIDine  0.1 mg Transdermal Weekly   enoxaparin (LOVENOX) injection  40 mg Subcutaneous Q24H   ondansetron (ZOFRAN) IV  4 mg Intravenous Once   Continuous Infusions:  dextrose 5%  lactated ringers 50 mL/hr at 11/17/22 0911   methocarbamol (ROBAXIN) IV       LOS: 6 days   Time spent: 18  Rhetta Mura, MD [facility] To contact the attending provider between 7A-7P or the covering provider during after hours 7P-7A, please log into the web site [email or web address] and access using universal [place] password for that web site. If you do not have the password, please call the hospital operator.  11/17/2022, 10:35 AM

## 2022-11-18 ENCOUNTER — Ambulatory Visit (INDEPENDENT_AMBULATORY_CARE_PROVIDER_SITE_OTHER): Payer: Medicare HMO

## 2022-11-18 DIAGNOSIS — G459 Transient cerebral ischemic attack, unspecified: Secondary | ICD-10-CM

## 2022-11-18 DIAGNOSIS — K56609 Unspecified intestinal obstruction, unspecified as to partial versus complete obstruction: Secondary | ICD-10-CM | POA: Diagnosis not present

## 2022-11-18 LAB — CUP PACEART REMOTE DEVICE CHECK
Date Time Interrogation Session: 20240428231226
Implantable Pulse Generator Implant Date: 20231115

## 2022-11-18 LAB — BASIC METABOLIC PANEL
Anion gap: 6 (ref 5–15)
BUN: 9 mg/dL (ref 8–23)
CO2: 25 mmol/L (ref 22–32)
Calcium: 8.2 mg/dL — ABNORMAL LOW (ref 8.9–10.3)
Chloride: 102 mmol/L (ref 98–111)
Creatinine, Ser: 0.6 mg/dL (ref 0.44–1.00)
GFR, Estimated: >60 mL/min (ref >60)
Glucose, Bld: 103 mg/dL — ABNORMAL HIGH (ref 70–99)
Potassium: 3.2 mmol/L — ABNORMAL LOW (ref 3.5–5.1)
Sodium: 133 mmol/L — ABNORMAL LOW (ref 135–145)

## 2022-11-18 LAB — GLUCOSE, CAPILLARY
Glucose-Capillary: 105 mg/dL — ABNORMAL HIGH (ref 70–99)
Glucose-Capillary: 95 mg/dL (ref 70–99)
Glucose-Capillary: 96 mg/dL (ref 70–99)
Glucose-Capillary: 99 mg/dL (ref 70–99)

## 2022-11-18 LAB — MAGNESIUM: Magnesium: 1.6 mg/dL — ABNORMAL LOW (ref 1.7–2.4)

## 2022-11-18 MED ORDER — IBUPROFEN 200 MG PO TABS: 400.0000 mg | ORAL_TABLET | Freq: Four times a day (QID) | ORAL | Status: DC | PRN

## 2022-11-18 MED ORDER — ACETAMINOPHEN 325 MG PO TABS
650.0000 mg | ORAL_TABLET | Freq: Four times a day (QID) | ORAL | Status: DC
  Administered 2022-11-18 – 2022-11-21 (×6): 650 mg via ORAL
  Filled 2022-11-18 (×9): qty 2

## 2022-11-18 MED ORDER — ENSURE ENLIVE PO LIQD
237.0000 mL | Freq: Two times a day (BID) | ORAL | Status: DC
  Administered 2022-11-18 – 2022-11-21 (×3): 237 mL via ORAL

## 2022-11-18 MED ORDER — POTASSIUM CHLORIDE CRYS ER 20 MEQ PO TBCR
40.0000 meq | EXTENDED_RELEASE_TABLET | Freq: Every day | ORAL | Status: DC
  Administered 2022-11-18 – 2022-11-21 (×4): 40 meq via ORAL
  Filled 2022-11-18 (×4): qty 2

## 2022-11-18 MED ORDER — LIDOCAINE 5 % EX PTCH
1.0000 | MEDICATED_PATCH | CUTANEOUS | Status: DC
  Administered 2022-11-18 – 2022-11-21 (×3): 1 via TRANSDERMAL
  Filled 2022-11-18 (×4): qty 1

## 2022-11-18 NOTE — Progress Notes (Addendum)
PROGRESS NOTE   Cheryl Martin  RUE:[id number] DOB: August 09, 1930 DOA: 11/11/2022 PCP: [name], PA  Brief Narrative:   89 year old white female recent L3-L4, L4-L5 hemilaminectomy with foraminotomies status post surgery Dr. [name] Presumed TIA 11/2021 Hypovolemic hyponatremia HLD ?  Urge incontinence followed by urology in [facility] Prior cholecystectomy/hysterectomy-present emergency room/20 08/2022 abdominal pain, N, V, Rx fentanyl- CT scan ABD pelvis = early SBO + dilated small bowel loops pelvis + right sided inguinal hernia  4/22 General Surgery consulted-Gastrografin protocol 4/25 status post ex lap + lysis of adhesions Dr. Cliffton Asters general surgery 4/29 NG tube removed graduated to  full liquid diet  Hospital-Problem based course  Partial SBO + adhesiolysis 4/25 Defer management to general surgery--now on full liquids--had a stool and can have prune juice if she wants  Previous TIA 11/2021 Hold oral meds-resume asa on discharge  Recent Lumbar surgery Mobilize with therapy services-requires only intermittent supervision and only needed minimum guard-likely will be discharging home  Mild hypokalemia Keep D5 LR at 50 and continue p.o. potassium [id number]  Hypovolemic hyponatremia As above  HTN, On as needed metoprolol and hydralazine IV Add clonidine patch 0.1-now is better controlled   DVT prophylaxis: Lovenox Code Status: DNR confirmed Family Communication: None present today Disposition:  Status is: Inpatient Remains inpatient appropriate because:   Will need to be able to eat prior to be able to discharge Therapy eval 4/28 recommending home health PT     Subjective:  + Stool no fever no chill Some nausea after graduated to full diet- have advised her to go slow  Objective: Vitals:   11/17/22 2005 11/18/22 0408 11/18/22 0800 11/18/22 0806  BP: (!) 149/75 124/60  135/68  Pulse: 69 71 (!) 54 71  Resp: 20 18  18   Temp: 98.2 F (36.8 C) 98.4 F  (36.9 C)  99.1 F (37.3 C)  TempSrc: Oral Oral  Oral  SpO2: 96% 96%  98%  Weight:      Height:        Intake/Output Summary (Last 24 hours) at 11/18/2022 1533 Last data filed at 11/18/2022 1250 Gross per 24 hour  Intake 900 ml  Output --  Net 900 ml    Filed Weights   11/11/22 1508 11/12/22 1305 11/14/22 1413  Weight: 58 kg 61.2 kg 62 kg    Examination:  Alert coherent NG tube out S1-S2 no murmur sinus on exam Chest is clear no wheeze rales rhonchi Honeycomb dressing in place abdomen soft, lidocaine patch on stomach  Data Reviewed: personally reviewed   CBC    Component Value Date/Time   WBC 5.9 11/17/2022 0311   RBC 3.24 (L) 11/17/2022 0311   HGB 10.3 (L) 11/17/2022 0311   HGB 12.7 03/29/2021 0952   HCT 30.0 (L) 11/17/2022 0311   HCT 38.9 03/29/2021 0952   PLT 135 (L) 11/17/2022 0311   PLT 187 03/29/2021 0952   MCV 92.6 11/17/2022 0311   MCV 94 03/29/2021 0952   MCH 31.8 11/17/2022 0311   MCHC 34.3 11/17/2022 0311   RDW 11.7 11/17/2022 0311   RDW 12.3 03/29/2021 0952   LYMPHSABS 0.7 11/16/2022 0357   LYMPHSABS 1.1 03/29/2021 0952   MONOABS 0.7 11/16/2022 0357   EOSABS 0.0 11/16/2022 0357   EOSABS 0.2 03/29/2021 0952   BASOSABS 0.0 11/16/2022 0357   BASOSABS 0.1 03/29/2021 0952      Latest Ref Rng & Units 11/18/2022    3:05 AM 11/17/2022    3:11 AM 11/16/2022  3:57 AM  CMP  Glucose 70 - 99 mg/dL 161  096  045   BUN 8 - 23 mg/dL 9  12  16    Creatinine 0.44 - 1.00 mg/dL 4.09  8.11  9.14   Sodium 135 - 145 mmol/L 133  133  138   Potassium 3.5 - 5.1 mmol/L 3.2  3.6  3.4   Chloride 98 - 111 mmol/L 102  101  105   CO2 22 - 32 mmol/L 25  22  29    Calcium 8.9 - 10.3 mg/dL 8.2  8.3  8.5      Radiology Studies: No results found.   Scheduled Meds:  acetaminophen  650 mg Oral Q6H   cloNIDine  0.1 mg Transdermal Weekly   enoxaparin (LOVENOX) injection  40 mg Subcutaneous Q24H   feeding supplement  237 mL Oral BID BM   lidocaine  1 patch Transdermal  Q24H   ondansetron (ZOFRAN) IV  4 mg Intravenous Once   potassium chloride  40 mEq Oral Daily   Continuous Infusions:  dextrose 5% lactated ringers 50 mL/hr at 11/18/22 0734   methocarbamol (ROBAXIN) IV       LOS: 7 days   Time spent: 51  Rhetta Mura, MD [facility] To contact the attending provider between 7A-7P or the covering provider during after hours 7P-7A, please log into the web site [email or web address] and access using universal [place] password for that web site. If you do not have the password, please call the hospital operator.  11/18/2022, 3:33 PM

## 2022-11-18 NOTE — Evaluation (Signed)
Occupational Therapy Evaluation Patient Details Name: Cheryl Martin MRN: [id number] DOB: 1931-07-03 Today's Date: 11/18/2022   History of Present Illness Pt is an 89 y.o. female admitted 11/11/22 with abdominal pain; workup for SBO. S/p ex lap, lysis of adhesions on 4/26. Of note, recently s/p L3-L4, L4-L5 hemilaminectomy with foraminotomies on 10/11/22. Other PMH includes CAD, TIA, HLD, OA, anemia.   Clinical Impression   Cheryl Martin was evaluated s/p the above admission list. She is indep and lives alone at baseline. Upon evaluation she was limited by abdominal pain and activity tolerance. Overall she needed min G - supervision A for all transfers and mobility with RW for increased safety. Due to the deficits listed below she also needs up to minG for LB ADLs. Pt will benefit from continued acute OT services and home health OT.       Recommendations for follow up therapy are one component of a multi-disciplinary discharge planning process, led by the attending physician.  Recommendations may be updated based on patient status, additional functional criteria and insurance authorization.   Assistance Recommended at Discharge Intermittent Supervision/Assistance  Patient can return home with the following A little help with walking and/or transfers;A little help with bathing/dressing/bathroom;Assistance with cooking/housework;Assist for transportation;Help with stairs or ramp for entrance    Functional Status Assessment  Patient has had a recent decline in their functional status and demonstrates the ability to make significant improvements in function in a reasonable and predictable amount of time.  Equipment Recommendations  None recommended by OT       Precautions / Restrictions Precautions Precautions: Fall;Other (comment);Back (back sx 3/22) Restrictions Weight Bearing Restrictions: No      Mobility Bed Mobility Overal bed mobility: Needs Assistance Bed Mobility: Rolling,  Sidelying to Sit, Sit to Sidelying Rolling: Supervision Sidelying to sit: Supervision     Sit to sidelying: Min guard General bed mobility comments: for safety only. pt with use of rail - encouraged no to simulate home set up    Transfers Overall transfer level: Needs assistance Equipment used: Rolling walker (2 wheels) Transfers: Sit to/from Stand Sit to Stand: Min guard, Supervision           General transfer comment: supervision from bed, min G from lower toilet      Balance Overall balance assessment: Needs assistance Sitting-balance support: Feet supported Sitting balance-Leahy Scale: Good     Standing balance support: No upper extremity supported, During functional activity Standing balance-Leahy Scale: Fair Standing balance comment: statically                           ADL either performed or assessed with clinical judgement   ADL Overall ADL's : Needs assistance/impaired Eating/Feeding: Independent   Grooming: Supervision/safety;Standing   Upper Body Bathing: Set up;Sitting   Lower Body Bathing: Min guard;Sit to/from stand   Upper Body Dressing : Set up;Sitting   Lower Body Dressing: Min guard;Sit to/from stand   Toilet Transfer: Supervision/safety;Ambulation;Rolling walker (2 wheels);Regular Toilet   Toileting- Clothing Manipulation and Hygiene: Supervision/safety;Sitting/lateral lean;Sit to/from stand       Functional mobility during ADLs: Supervision/safety;Rolling walker (2 wheels) General ADL Comments: slow and steady gait, no overt LOB or safety concern.     Vision Baseline Vision/History: 1 Wears glasses Vision Assessment?: No apparent visual deficits     Perception Perception Perception Tested?: No   Praxis Praxis Praxis tested?: Within functional limits    Pertinent Vitals/Pain Pain Assessment Pain Assessment: Faces Faces  Pain Scale: Hurts little more Pain Location: abdomen Pain Descriptors / Indicators: Discomfort,  Grimacing Pain Intervention(s): Limited activity within patient's tolerance, Monitored during session     Hand Dominance Right   Extremity/Trunk Assessment Upper Extremity Assessment Upper Extremity Assessment: Overall WFL for tasks assessed   Lower Extremity Assessment Lower Extremity Assessment: Defer to PT evaluation   Cervical / Trunk Assessment Cervical / Trunk Assessment: Normal;Other exceptions;Back Surgery Cervical / Trunk Exceptions: recent back surgery   Communication Communication Communication: No difficulties   Cognition Arousal/Alertness: Awake/alert Behavior During Therapy: WFL for tasks assessed/performed Overall Cognitive Status: Within Functional Limits for tasks assessed                                       General Comments  Successful BM this date            Home Living Family/patient expects to be discharged to:: Private residence Living Arrangements: Children Available Help at Discharge: Family;Available PRN/intermittently Type of Home: House Home Access: Stairs to enter [employer] of Steps: 3 Entrance Stairs-Rails: Left Home Layout: One level     Bathroom Shower/Tub: Producer, television/film/video: Standard Bathroom Accessibility: Yes   Home Equipment: Cane - single point   Additional Comments: independent      Prior Functioning/Environment Prior Level of Function : Independent/Modified Independent;Driving             Mobility Comments: independent - used RW for a short time after recent back sx ADLs Comments: independent, drives occasionally, but only short distances        OT Problem List: Decreased strength;Decreased range of motion;Decreased activity tolerance      OT Treatment/Interventions: Self-care/ADL training;Therapeutic exercise;DME and/or AE instruction;Therapeutic activities;Balance training;Patient/family education    OT Goals(Current goals can be found in the care plan section)  Acute Rehab OT Goals Patient Stated Goal: to get stronger OT Goal Formulation: With patient Time For Goal Achievement: 12/02/22 Potential to Achieve Goals: Good ADL Goals Pt Will Perform Lower Body Dressing: Independently;sit to/from stand Pt Will Transfer to Toilet: Independently;ambulating Additional ADL Goal #1: Pt will indep recall at least 3 energy conservation strategies to apply to the home setting  OT Frequency: Min 1X/week       AM-PAC OT "6 Clicks" Daily Activity     Outcome Measure Help from another person eating meals?: None Help from another person taking care of personal grooming?: A Little Help from another person toileting, which includes using toliet, bedpan, or urinal?: A Little Help from another person bathing (including washing, rinsing, drying)?: A Little Help from another person to put on and taking off regular upper body clothing?: None Help from another person to put on and taking off regular lower body clothing?: A Little 6 Click Score: 20   End of Session Equipment Utilized During Treatment: Rolling walker (2 wheels);Gait belt Nurse Communication: Mobility status  Activity Tolerance: Patient tolerated treatment well Patient left: in bed;with call bell/phone within reach;with bed alarm set  OT Visit Diagnosis: Unsteadiness on feet (R26.81);Other abnormalities of gait and mobility (R26.89);Muscle weakness (generalized) (M62.81)                Time: 1026-1050 OT Time Calculation (min): 24 min Charges:  OT General Charges $OT Visit: 1 Visit OT Evaluation $OT Eval Moderate Complexity: 1 Mod OT Treatments $Self Care/Home Management : 8-22 mins  Derenda Mis, OTR/L Acute Rehabilitation Services  Office [phone] Secure Chat Communication Preferred   Donia Pounds 11/18/2022, 11:20 AM

## 2022-11-18 NOTE — Progress Notes (Signed)
Patient ID: Cheryl Martin, female   DOB: 1931/06/16, 89 y.o.   MRN: [id number] 4 Days Post-Op    Subjective: Reports diffuse abdominal pain early this morning, improved with pain meds. Tolerating liquids without nausea/emesis. Does endorse flatus, denies BM. No other complaints.  ROS negative except as listed above. Objective: Vital signs in last 24 hours: Temp:  [98.2 F (36.8 C)-99.1 F (37.3 C)] 99.1 F (37.3 C) (04/29 0806) Pulse Rate:  [54-76] 71 (04/29 0806) Resp:  [18-20] 18 (04/29 0806) BP: (124-155)/(60-75) 135/68 (04/29 0806) SpO2:  [96 %-99 %] 98 % (04/29 0806) Last BM Date : 11/11/22  Intake/Output from previous day: 04/28 0701 - 04/29 0700 In: 360 [P.O.:360] Out: -  Intake/Output this shift: No intake/output data recorded.  Gen: alert, elderly female, appears younger than stated age Pulm: normal effort on room air Abd: soft, some distention of lower abdomen, appropriately tender, midline incision with honeycomb dressing and staples - c/d/I no drainage or cellulitis   Lab Results: CBC  Recent Labs    11/16/22 0357 11/17/22 0311  WBC 4.0 5.9  HGB 10.7* 10.3*  HCT 32.5* 30.0*  PLT 157 135*   BMET Recent Labs    11/17/22 0311 11/18/22 0305  NA 133* 133*  K 3.6 3.2*  CL 101 102  CO2 22 25  GLUCOSE 108* 103*  BUN 12 9  CREATININE 0.57 0.60  CALCIUM 8.3* 8.2*   PT/INR No results for input(s): "LABPROT", "INR" in the last 72 hours. ABG No results for input(s): "PHART", "HCO3" in the last 72 hours.  Invalid input(s): "PCO2", "PO2"  Studies/Results: No results found.  Anti-infectives: Anti-infectives (From admission, onward)    Start     Dose/Rate Route Frequency Ordered Stop   11/14/22 1300  cefoTEtan (CEFOTAN) 2 g in sodium chloride 0.9 % 100 mL IVPB       Note to Pharmacy: Pharmacy may adjust dose strength for optimal dosing.   Send with patient on call to the OR.  Anesthesia to complete antibiotic administration <41min prior to incision  per [facility].   2 g 200 mL/hr over 30 Minutes Intravenous On call to O.R. 11/14/22 0914 11/14/22 1526       Assessment/Plan: EXPLORATORY LAPAROTOMY, LYSIS OF ADHESIONS 11/14/2022 for SBO POD#4 Having flatus, some distention. Advance to FLD + ensure and await further bowel function Moving well with therapies - ambulating using RW  Schedule PO tylenol and add lidoderm patch to maximize non-narcotic pain control, PRN advil as well.    LOS: 7 days    Adam Phenix, PA-C   Use [email or web address] to contact on call provider  11/18/2022

## 2022-11-19 ENCOUNTER — Inpatient Hospital Stay (HOSPITAL_COMMUNITY): Payer: Medicare HMO

## 2022-11-19 DIAGNOSIS — K56609 Unspecified intestinal obstruction, unspecified as to partial versus complete obstruction: Secondary | ICD-10-CM | POA: Diagnosis not present

## 2022-11-19 LAB — GLUCOSE, CAPILLARY
Glucose-Capillary: 101 mg/dL — ABNORMAL HIGH (ref 70–99)
Glucose-Capillary: 103 mg/dL — ABNORMAL HIGH (ref 70–99)
Glucose-Capillary: 94 mg/dL (ref 70–99)
Glucose-Capillary: 89 mg/dL (ref 70–99)

## 2022-11-19 LAB — BASIC METABOLIC PANEL
Anion gap: 8 (ref 5–15)
BUN: 8 mg/dL (ref 8–23)
CO2: 24 mmol/L (ref 22–32)
Calcium: 8.6 mg/dL — ABNORMAL LOW (ref 8.9–10.3)
Chloride: 103 mmol/L (ref 98–111)
Creatinine, Ser: 0.64 mg/dL (ref 0.44–1.00)
GFR, Estimated: >60 mL/min (ref >60)
Glucose, Bld: 105 mg/dL — ABNORMAL HIGH (ref 70–99)
Potassium: 3.6 mmol/L (ref 3.5–5.1)
Sodium: 135 mmol/L (ref 135–145)

## 2022-11-19 MED ORDER — PROCHLORPERAZINE EDISYLATE 10 MG/2ML IJ SOLN
5.0000 mg | INTRAMUSCULAR | Status: DC | PRN
  Administered 2022-11-19: 5 mg via INTRAVENOUS
  Filled 2022-11-19: qty 2

## 2022-11-19 MED ORDER — BISACODYL 10 MG RE SUPP
10.0000 mg | Freq: Once | RECTAL | Status: AC
  Administered 2022-11-19: 10 mg via RECTAL
  Filled 2022-11-19: qty 1

## 2022-11-19 MED ORDER — OXYCODONE HCL 5 MG PO TABS
2.5000 mg | ORAL_TABLET | ORAL | Status: DC | PRN
  Filled 2022-11-19: qty 1

## 2022-11-19 MED ORDER — DEXTROSE 5 % IV SOLN: INTRAVENOUS | Status: DC

## 2022-11-19 NOTE — Progress Notes (Signed)
Patient ID: Cheryl Martin, female   DOB: [birth year], 89 y.o.   MRN: [id number] 5 Days Post-Op    Subjective:  >10 BMs last 24h. Described as loose. Denies incontinence and has been getting up to bedside commode independently. Denies nausea or vomiting. Tolerating liquids. Asking to take a bath/shower.  Per RN the patients stools are loose but semi-solid and brown in colon.   ROS negative except as listed above. Objective: Vital signs in last 24 hours: Temp:  [97.4 F (36.3 C)-99.1 F (37.3 C)] 98.1 F (36.7 C) (04/30 0743) Pulse Rate:  [54-76] 76 (04/30 0743) Resp:  [15-18] 16 (04/30 0743) BP: (122-141)/(55-68) 122/58 (04/30 0743) SpO2:  [97 %-100 %] 98 % (04/30 0743) Last BM Date : 11/18/22  Intake/Output from previous day: 04/29 0701 - 04/30 0700 In: 640 [P.O.:640] Out: -  Intake/Output this shift: No intake/output data recorded.  Gen: alert, elderly female, appears younger than stated age Pulm: normal effort on room air Abd: soft, mild distention of lower abdomen, decreased distention compared to yesterday, appropriately tender, dressing removed from midline incision, staples c/d/I without drainage or cellulitis   Lab Results: CBC  Recent Labs    11/17/22 0311  WBC 5.9  HGB 10.3*  HCT 30.0*  PLT 135*   BMET Recent Labs    11/18/22 0305 11/19/22 0401  NA 133* 135  K 3.2* 3.6  CL 102 103  CO2 25 24  GLUCOSE 103* 105*  BUN 9 8  CREATININE 0.60 0.64  CALCIUM 8.2* 8.6*   PT/INR No results for input(s): "LABPROT", "INR" in the last 72 hours. ABG No results for input(s): "PHART", "HCO3" in the last 72 hours.  Invalid input(s): "PCO2", "PO2"  Studies/Results: CUP PACEART REMOTE DEVICE CHECK  Result Date: 11/18/2022 ILR summary report received. Battery status OK. Normal device function. No new symptom, tachy, brady, or pause episodes. No new AF episodes.  AF burden is 0% of the time.   Monthly summary reports and ROV/PRN Hassell Halim, RN, CCDS, CV  [employer]   Anti-infectives: Anti-infectives (From admission, onward)    Start     Dose/Rate Route Frequency Ordered Stop   11/14/22 1300  cefoTEtan (CEFOTAN) 2 g in sodium chloride 0.9 % 100 mL IVPB       Note to Pharmacy: Pharmacy may adjust dose strength for optimal dosing.   Send with patient on call to the OR.  Anesthesia to complete antibiotic administration <41min prior to incision per [facility].   2 g 200 mL/hr over 30 Minutes Intravenous On call to O.R. 11/14/22 0914 11/14/22 1526       Assessment/Plan: EXPLORATORY LAPAROTOMY, LYSIS OF ADHESIONS 11/14/2022 for SBO POD#5 Having flatus, started having bowel movements yesterday FLD, advance to soft diet.  Moving well with therapies - ambulating using RW Patient may shower  4/29: scheduled PO tylenol and added lidoderm patch to maximize non-narcotic pain control, PRN advil as well.    Ok for discharge home this afternoon pending tolerating of soft diet and adequate support at home.    LOS: 8 days    Adam Phenix, PA-C   Use [email or web address] to contact on call provider  11/19/2022

## 2022-11-19 NOTE — Progress Notes (Signed)
PROGRESS NOTE   Cheryl Martin  ZOX:[id number] DOB: 1930-11-26 DOA: 11/11/2022 PCP: [name], PA  Brief Narrative:   89 year old white female recent L3-L4, L4-L5 hemilaminectomy with foraminotomies status post surgery Dr. [name] Presumed TIA 11/2021 Hypovolemic hyponatremia HLD ?  Urge incontinence followed by urology in [facility] Prior cholecystectomy/hysterectomy-present emergency room/20 08/2022 abdominal pain, N, V, Rx fentanyl- CT scan ABD pelvis = early SBO + dilated small bowel loops pelvis + right sided inguinal hernia  4/22 General Surgery consulted-Gastrografin protocol 4/25 status post ex lap + lysis of adhesions Dr. Cliffton Asters general surgery 4/29 NG tube removed graduated to  full liquid diet  Hospital-Problem based course  Partial SBO + adhesiolysis 4/25 Defer management to general surgery--vomited lunch, tol breakfast--slow down diet Reassess again in am to grad diet back to soft Change tramadol-->low dose oxy IR for breakthru not relieved by tylenol  Previous TIA 11/2021 Hold oral meds-resume asa on discharge  Recent Lumbar surgery Mobilize with therapy services-requires only intermittent supervision and only needed minimum guard-likely will be discharging home when tol diet  Mild hypokalemia NSL the D5/LR Hold K replacement  Hypovolemic hyponatremia As above  HTN, On as needed metoprolol and hydralazine IV Add clonidine patch 0.1-now is better controlled'   DVT prophylaxis: Lovenox Code Status: DNR confirmed Family Communication: None present today Disposition:  Status is: Inpatient Remains inpatient appropriate because:   Will need to be able to eat prior to be able to discharge Therapy eval 4/28 recommending home health PT     Subjective:  Well this am-a little sleepy, no cp fever No n--this afternoon + Emesis [? Tramadol]--using tylenol instead   Objective: Vitals:   11/18/22 1645 11/18/22 2011 11/19/22 0353 11/19/22 0743  BP:  (!) 127/55 (!) 141/64 139/62 (!) 122/58  Pulse: 64 69 76 76  Resp: 16 15 18 16   Temp: 98.2 F (36.8 C) 98.6 F (37 C) (!) 97.4 F (36.3 C) 98.1 F (36.7 C)  TempSrc: Oral Oral Oral Oral  SpO2: 99% 100% 97% 98%  Weight:      Height:        Intake/Output Summary (Last 24 hours) at 11/19/2022 1415 Last data filed at 11/18/2022 2216 Gross per 24 hour  Intake 100 ml  Output --  Net 100 ml    Filed Weights   11/11/22 1508 11/12/22 1305 11/14/22 1413  Weight: 58 kg 61.2 kg 62 kg    Examination:  A little sleepy but rousable coherent NG tube out S1-S2 no murmur sinus on exam Chest is clear no wheeze rales rhonchi Honeycomb dressing in place Abd much softer   Data Reviewed: personally reviewed   CBC    Component Value Date/Time   WBC 5.9 11/17/2022 0311   RBC 3.24 (L) 11/17/2022 0311   HGB 10.3 (L) 11/17/2022 0311   HGB 12.7 03/29/2021 0952   HCT 30.0 (L) 11/17/2022 0311   HCT 38.9 03/29/2021 0952   PLT 135 (L) 11/17/2022 0311   PLT 187 03/29/2021 0952   MCV 92.6 11/17/2022 0311   MCV 94 03/29/2021 0952   MCH 31.8 11/17/2022 0311   MCHC 34.3 11/17/2022 0311   RDW 11.7 11/17/2022 0311   RDW 12.3 03/29/2021 0952   LYMPHSABS 0.7 11/16/2022 0357   LYMPHSABS 1.1 03/29/2021 0952   MONOABS 0.7 11/16/2022 0357   EOSABS 0.0 11/16/2022 0357   EOSABS 0.2 03/29/2021 0952   BASOSABS 0.0 11/16/2022 0357   BASOSABS 0.1 03/29/2021 0952      Latest Ref  Rng & Units 11/19/2022    4:01 AM 11/18/2022    3:05 AM 11/17/2022    3:11 AM  CMP  Glucose 70 - 99 mg/dL 841  324  401   BUN 8 - 23 mg/dL 8  9  12    Creatinine 0.44 - 1.00 mg/dL 0.27  2.53  6.64   Sodium 135 - 145 mmol/L 135  133  133   Potassium 3.5 - 5.1 mmol/L 3.6  3.2  3.6   Chloride 98 - 111 mmol/L 103  102  101   CO2 22 - 32 mmol/L 24  25  22    Calcium 8.9 - 10.3 mg/dL 8.6  8.2  8.3      Radiology Studies: CUP PACEART REMOTE DEVICE CHECK  Result Date: 11/18/2022 ILR summary report received. Battery status OK.  Normal device function. No new symptom, tachy, brady, or pause episodes. No new AF episodes.  AF burden is 0% of the time.   Monthly summary reports and ROV/PRN Hassell Halim, RN, CCDS, CV [employer]    Scheduled Meds:  acetaminophen  650 mg Oral Q6H   cloNIDine  0.1 mg Transdermal Weekly   enoxaparin (LOVENOX) injection  40 mg Subcutaneous Q24H   feeding supplement  237 mL Oral BID BM   lidocaine  1 patch Transdermal Q24H   ondansetron (ZOFRAN) IV  4 mg Intravenous Once   potassium chloride  40 mEq Oral Daily   Continuous Infusions:  methocarbamol (ROBAXIN) IV       LOS: 8 days   Time spent: 53  Rhetta Mura, MD [facility] To contact the attending provider between 7A-7P or the covering provider during after hours 7P-7A, please log into the web site [email or web address] and access using universal [place] password for that web site. If you do not have the password, please call the hospital operator.  11/19/2022, 2:15 PM

## 2022-11-19 NOTE — Progress Notes (Signed)
Physical Therapy Treatment Patient Details Name: Cheryl Martin MRN: [id number] DOB: 1930-09-19 Today's Date: 11/19/2022   History of Present Illness Pt is an 89 y.o. female admitted 11/11/22 with abdominal pain; workup for SBO. S/p ex lap, lysis of adhesions on 4/26. Of note, recently s/p L3-L4, L4-L5 hemilaminectomy with foraminotomies on 10/11/22. Other PMH includes CAD, TIA, HLD, OA, anemia.    PT Comments    Pt reports fatigue upon PT arrival to room, states she had diarrhea all night. Pt is agreeable to hallway mobility, pt mobilizing with RW at supervision level until pt with cramping-type abdominal pain towards end of gait. Pt required seated rest, and RN arrived to room to bring pain medication. Pt plans to d/c home with assist of her daughter as needed, anticipate good functional recovery while acute and post-acutely.      Recommendations for follow up therapy are one component of a multi-disciplinary discharge planning process, led by the attending physician.  Recommendations may be updated based on patient status, additional functional criteria and insurance authorization.  Follow Up Recommendations       Assistance Recommended at Discharge Intermittent Supervision/Assistance  Patient can return home with the following Assist for transportation;Help with stairs or ramp for entrance   Equipment Recommendations  None recommended by PT    Recommendations for Other Services       Precautions / Restrictions Precautions Precautions: Fall;Other (comment);Back (back sx 3/22) Restrictions Weight Bearing Restrictions: No     Mobility  Bed Mobility Overal bed mobility: Needs Assistance Bed Mobility: Rolling, Sidelying to Sit, Sit to Sidelying Rolling: Supervision Sidelying to sit: Supervision     Sit to sidelying: Supervision General bed mobility comments: cues for log roll technique    Transfers Overall transfer level: Needs assistance Equipment used: Rolling  walker (2 wheels) Transfers: Sit to/from Stand Sit to Stand: Supervision           General transfer comment: for safety, slow to rise when standing for second attempt given abdominal pain. x2 stands    Ambulation/Gait Ambulation/Gait assistance: Supervision Gait Distance (Feet): 125 Feet Assistive device: Rolling walker (2 wheels) Gait Pattern/deviations: Step-through pattern, Decreased stride length, Trunk flexed Gait velocity: decr     General Gait Details: for safety, pt self-cuing for upright posture. x1 period of doubling over in pain, requires PT assist to lower into chair   Stairs             Wheelchair Mobility    Modified Rankin (Stroke Patients Only)       Balance Overall balance assessment: Needs assistance Sitting-balance support: Feet supported Sitting balance-Leahy Scale: Good     Standing balance support: No upper extremity supported, During functional activity Standing balance-Leahy Scale: Fair                              Cognition Arousal/Alertness: Awake/alert Behavior During Therapy: WFL for tasks assessed/performed Overall Cognitive Status: Within Functional Limits for tasks assessed                                          Exercises      General Comments        Pertinent Vitals/Pain Pain Assessment Pain Assessment: Faces Faces Pain Scale: Hurts even more Pain Location: abdomen Pain Descriptors / Indicators: Discomfort, Grimacing Pain Intervention(s): Limited activity within patient's  tolerance, Monitored during session, Repositioned    Home Living                          Prior Function            PT Goals (current goals can now be found in the care plan section) Acute Rehab PT Goals Patient Stated Goal: regain independence and get back to normal activities PT Goal Formulation: With patient Time For Goal Achievement: 11/29/22 Potential to Achieve Goals: Good Progress towards  PT goals: Progressing toward goals    Frequency    Min 3X/week      PT Plan Current plan remains appropriate    Co-evaluation              AM-PAC PT "6 Clicks" Mobility   Outcome Measure  Help needed turning from your back to your side while in a flat bed without using bedrails?: None Help needed moving from lying on your back to sitting on the side of a flat bed without using bedrails?: A Little Help needed moving to and from a bed to a chair (including a wheelchair)?: A Little Help needed standing up from a chair using your arms (e.g., wheelchair or bedside chair)?: A Little Help needed to walk in hospital room?: A Little Help needed climbing 3-5 steps with a railing? : A Little 6 Click Score: 19    End of Session   Activity Tolerance: Patient tolerated treatment well Patient left: with call bell/phone within reach;in bed;with bed alarm set Nurse Communication: Mobility status PT Visit Diagnosis: Other abnormalities of gait and mobility (R26.89);Muscle weakness (generalized) (M62.81);Difficulty in walking, not elsewhere classified (R26.2)     Time: 4098-1191 PT Time Calculation (min) (ACUTE ONLY): 19 min  Charges:  $Gait Training: 8-22 mins                     Marye Round, PT DPT Acute Rehabilitation Services Secure Chat Preferred  Office [phone]    Lyncoln Maskell Sheliah Plane 11/19/2022, 2:47 PM

## 2022-11-19 NOTE — Progress Notes (Addendum)
Dilated loops/Ileus on AXR  --Re-start IVF-Dulcolax  Updated daughter Wetzel Bjornstad [phone]   Rest per Gen surg  Pleas Koch, MD [facility] 7:27 PM

## 2022-11-20 DIAGNOSIS — K56609 Unspecified intestinal obstruction, unspecified as to partial versus complete obstruction: Secondary | ICD-10-CM | POA: Diagnosis not present

## 2022-11-20 DIAGNOSIS — E785 Hyperlipidemia, unspecified: Secondary | ICD-10-CM

## 2022-11-20 LAB — CBC WITH DIFFERENTIAL/PLATELET
Abs Immature Granulocytes: 0.03 10*3/uL (ref 0.00–0.07)
Basophils Absolute: 0 10*3/uL (ref 0.0–0.1)
Basophils Relative: 0 %
Eosinophils Absolute: 0.1 10*3/uL (ref 0.0–0.5)
Eosinophils Relative: 2 %
HCT: 30.5 % — ABNORMAL LOW (ref 36.0–46.0)
Hemoglobin: 10.6 g/dL — ABNORMAL LOW (ref 12.0–15.0)
Immature Granulocytes: 1 %
Lymphocytes Relative: 13 %
Lymphs Abs: 0.8 10*3/uL (ref 0.7–4.0)
MCH: 31.3 pg (ref 26.0–34.0)
MCHC: 34.8 g/dL (ref 30.0–36.0)
MCV: 90 fL (ref 80.0–100.0)
Monocytes Absolute: 0.9 10*3/uL (ref 0.1–1.0)
Monocytes Relative: 15 %
Neutro Abs: 4.3 10*3/uL (ref 1.7–7.7)
Neutrophils Relative %: 69 %
Platelets: 230 10*3/uL (ref 150–400)
RBC: 3.39 MIL/uL — ABNORMAL LOW (ref 3.87–5.11)
RDW: 11.7 % (ref 11.5–15.5)
WBC: 6.1 10*3/uL (ref 4.0–10.5)
nRBC: 0 % (ref 0.0–0.2)

## 2022-11-20 LAB — GLUCOSE, CAPILLARY
Glucose-Capillary: 105 mg/dL — ABNORMAL HIGH (ref 70–99)
Glucose-Capillary: 87 mg/dL (ref 70–99)

## 2022-11-20 LAB — BASIC METABOLIC PANEL
Anion gap: 10 (ref 5–15)
BUN: 8 mg/dL (ref 8–23)
CO2: 21 mmol/L — ABNORMAL LOW (ref 22–32)
Calcium: 8 mg/dL — ABNORMAL LOW (ref 8.9–10.3)
Chloride: 102 mmol/L (ref 98–111)
Creatinine, Ser: 0.62 mg/dL (ref 0.44–1.00)
GFR, Estimated: >60 mL/min (ref >60)
Glucose, Bld: 97 mg/dL (ref 70–99)
Potassium: 3.5 mmol/L (ref 3.5–5.1)
Sodium: 133 mmol/L — ABNORMAL LOW (ref 135–145)

## 2022-11-20 NOTE — Progress Notes (Addendum)
Patient ID: ARIELY RIDDELL, female   DOB: 06/22/1931, 89 y.o.   MRN: [id number] 6 Days Post-Op    Subjective:  Resting comfortably this morning. She developed nausea and multiple episodes of emesis yesterday, but continues to have loose, non-bloody stools. Last episode of vomiting was last night. This morning she states she feels better, she feels her abdomen is less distended, she denies nausea. Her last BM was around 0600. She tells me that she is very particular about her diet, and is vegetarian, so she has mostly felt comfortable eating bananas here, not much else.   ROS negative except as listed above. Objective: Vital signs in last 24 hours: Temp:  [97.7 F (36.5 C)-98.5 F (36.9 C)] 98.5 F (36.9 C) (05/01 0444) Pulse Rate:  [70-92] 74 (05/01 0444) Resp:  [17-18] 18 (05/01 0444) BP: (118-136)/(52-70) 118/52 (05/01 0444) SpO2:  [97 %] 97 % (05/01 0444) Last BM Date : 11/19/22  Intake/Output from previous day: 04/30 0701 - 05/01 0700 In: -  Out: 2 [Stool:2] Intake/Output this shift: No intake/output data recorded.  Gen: alert, elderly female, appears younger than stated age Pulm: normal effort on room air Abd: soft, interval improvement in abdominal distention, appropriately tender, dressing removed from midline incision, staples c/d/I without drainage or cellulitis   Lab Results: CBC  Recent Labs    11/20/22 0402  WBC 6.1  HGB 10.6*  HCT 30.5*  PLT 230   BMET Recent Labs    11/19/22 0401 11/20/22 0402  NA 135 133*  K 3.6 3.5  CL 103 102  CO2 24 21*  GLUCOSE 105* 97  BUN 8 8  CREATININE 0.64 0.62  CALCIUM 8.6* 8.0*   PT/INR No results for input(s): "LABPROT", "INR" in the last 72 hours. ABG No results for input(s): "PHART", "HCO3" in the last 72 hours.  Invalid input(s): "PCO2", "PO2"  Studies/Results: DG Abd Portable 1V  Result Date: 11/19/2022 CLINICAL DATA:  Vomiting. EXAM: PORTABLE ABDOMEN - 1 VIEW COMPARISON:  11/14/2022 FINDINGS: New  midline skin staples are noted consistent with recent surgery. Mild air distended bowel mainly in the small bowel. Findings could be due to a postoperative ileus or recurrent small-bowel obstruction. Recommend correlation with bowel sounds. No obvious free air. IMPRESSION: Mild air distended bowel mainly in the small bowel could be due to a postoperative ileus or recurrent small-bowel obstruction. Electronically Signed   By: Rudie Meyer M.D.   On: 11/19/2022 18:33    Anti-infectives: Anti-infectives (From admission, onward)    Start     Dose/Rate Route Frequency Ordered Stop   11/14/22 1300  cefoTEtan (CEFOTAN) 2 g in sodium chloride 0.9 % 100 mL IVPB       Note to Pharmacy: Pharmacy may adjust dose strength for optimal dosing.   Send with patient on call to the OR.  Anesthesia to complete antibiotic administration <72min prior to incision per [facility].   2 g 200 mL/hr over 30 Minutes Intravenous On call to O.R. 11/14/22 0914 11/14/22 1526       Assessment/Plan: EXPLORATORY LAPAROTOMY, LYSIS OF ADHESIONS 11/14/2022 for SBO POD#6 Ileus slowly resolving, x-ray from yesterday reviewed. Clinically she looks much better today.  Re-attempt soft diet today, ok for family to bring in some food from home (bland, low fiber encouraged) If she develops recurrent pain, distention, nausea/vomiting then may need CT abd/pelvis to further evaluate. Moving well with therapies - ambulating using RW Patient may shower  Continue to maximize non-narcotic pain control with tylenol, lidoderm,  PRN advil     LOS: 9 days    Adam Phenix, PA-C   Use [email or web address] to contact on call provider  11/20/2022

## 2022-11-20 NOTE — Progress Notes (Signed)
Occupational Therapy Treatment Patient Details Name: Cheryl Martin MRN: [id number] DOB: 06/07/1931 Today's Date: 11/20/2022   History of present illness Pt is an 89 y.o. female admitted 11/11/22 with abdominal pain; workup for SBO. S/p ex lap, lysis of adhesions on 4/26. Of note, recently s/p L3-L4, L4-L5 hemilaminectomy with foraminotomies on 10/11/22. Other PMH includes CAD, TIA, HLD, OA, anemia.   OT comments  Pt has met her OT goals and reports feeling "much better," and back to baseline. She demonstrated mobility and ADLs with mod I and tolerated >10 minutes of OOB activity without fatigue or safety concern. Pt does not have further acute OT needs, continue to recommend HHOT to facilitate rehab and safety in the home setting.   Recommendations for follow up therapy are one component of a multi-disciplinary discharge planning process, led by the attending physician.  Recommendations may be updated based on patient status, additional functional criteria and insurance authorization.    Assistance Recommended at Discharge Intermittent Supervision/Assistance  Patient can return home with the following  A little help with walking and/or transfers;A little help with bathing/dressing/bathroom;Assistance with cooking/housework;Assist for transportation;Help with stairs or ramp for entrance   Equipment Recommendations  None recommended by OT       Precautions / Restrictions Precautions Precautions: Fall;Other (comment);Back Precaution Comments: recent back surgery Restrictions Weight Bearing Restrictions: No       Mobility Bed Mobility Overal bed mobility: Needs Assistance             General bed mobility comments: sitting EOB upon arrival    Transfers Overall transfer level: Needs assistance Equipment used: Rolling walker (2 wheels) Transfers: Sit to/from Stand Sit to Stand: Modified independent (Device/Increase time)                 Balance Overall balance  assessment: Needs assistance Sitting-balance support: Feet supported Sitting balance-Leahy Scale: Good     Standing balance support: No upper extremity supported, During functional activity Standing balance-Leahy Scale: Fair                             ADL either performed or assessed with clinical judgement   ADL Overall ADL's : Needs assistance/impaired                     Lower Body Dressing: Independent;Sit to/from stand   Toilet Transfer: Independent;Ambulation           Functional mobility during ADLs: Modified independent;Rolling walker (2 wheels) General ADL Comments: no assist or safety concern noted    Extremity/Trunk Assessment Upper Extremity Assessment Upper Extremity Assessment: Overall WFL for tasks assessed   Lower Extremity Assessment Lower Extremity Assessment: Defer to PT evaluation        Vision   Vision Assessment?: No apparent visual deficits Additional Comments: wears glasses   Perception Perception Perception: Not tested   Praxis Praxis Praxis: Not tested    Cognition Arousal/Alertness: Awake/alert Behavior During Therapy: WFL for tasks assessed/performed Overall Cognitive Status: Within Functional Limits for tasks assessed                                          Exercises      Shoulder Instructions       General Comments VSS on RA    Pertinent Vitals/ Pain       Pain  Assessment Pain Assessment: No/denies pain   Frequency  Min 1X/week        Progress Toward Goals  OT Goals(current goals can now be found in the care plan section)  Progress towards OT goals: Goals met/education completed, patient discharged from OT  Acute Rehab OT Goals Patient Stated Goal: to go home OT Goal Formulation: With patient Time For Goal Achievement: 12/02/22 Potential to Achieve Goals: Good ADL Goals Pt Will Perform Lower Body Dressing: Independently;sit to/from stand Pt Will Transfer to Toilet:  Independently;ambulating Additional ADL Goal #1: Pt will indep recall at least 3 energy conservation strategies to apply to the home setting  Plan Discharge plan remains appropriate       AM-PAC OT "6 Clicks" Daily Activity     Outcome Measure   Help from another person eating meals?: None Help from another person taking care of personal grooming?: None Help from another person toileting, which includes using toliet, bedpan, or urinal?: None Help from another person bathing (including washing, rinsing, drying)?: None Help from another person to put on and taking off regular upper body clothing?: None Help from another person to put on and taking off regular lower body clothing?: None 6 Click Score: 24    End of Session Equipment Utilized During Treatment: Rolling walker (2 wheels)  OT Visit Diagnosis: Unsteadiness on feet (R26.81);Other abnormalities of gait and mobility (R26.89);Muscle weakness (generalized) (M62.81)   Activity Tolerance Patient tolerated treatment well   Patient Left in bed;with call bell/phone within reach;with bed alarm set   Nurse Communication Mobility status        Time: 1650-1710 OT Time Calculation (min): 20 min  Charges: OT General Charges $OT Visit: 1 Visit OT Treatments $Self Care/Home Management : 8-22 mins  Derenda Mis, OTR/L Acute Rehabilitation Services Office [phone] Secure Chat Communication Preferred   Donia Pounds 11/20/2022, 5:13 PM

## 2022-11-20 NOTE — Progress Notes (Signed)
PROGRESS NOTE   Cheryl Martin  ONG:[id number] DOB: 08/08/1930 DOA: 11/11/2022 PCP: [name], PA  Brief Narrative:   89 year old white female recent L3-L4, L4-L5 hemilaminectomy with foraminotomies status post surgery Dr. [name] Presumed TIA 11/2021 Hypovolemic hyponatremia HLD ?  Urge incontinence followed by urology in [facility] Prior cholecystectomy/hysterectomy-present emergency room/20 08/2022 abdominal pain, N, V, Rx fentanyl- CT scan ABD pelvis = early SBO + dilated small bowel loops pelvis + right sided inguinal hernia  4/22 General Surgery consulted-Gastrografin protocol 4/25 status post ex lap + lysis of adhesions Dr. Cliffton Asters general surgery 4/29 NG tube removed graduated to  full liquid diet. 5/1: Patient with some nausea and vomiting overnight but much improved in the morning.  Diet advanced to soft, she is getting food from home.  General surgery would like to see her for another day and if remains stable can be discharged tomorrow.  Hospital-Problem based course  Partial SBO s/p laparotomy and adhesiolysis 4/25 Defer management to general surgery--vomited lunch, tol breakfast--slow down diet Reassess again in am to grad diet back to soft Change tramadol-->low dose oxy IR for breakthru not relieved by tylenol  Previous TIA 11/2021 Hold oral meds-resume asa on discharge  Recent Lumbar surgery Mobilize with therapy services-requires only intermittent supervision and only needed minimum guard-likely will be discharging home when tol diet  Mild hypokalemia Resolved  Hypovolemic hyponatremia Improving  HTN, On as needed metoprolol and hydralazine IV Add clonidine patch 0.1-now is better controlled'   DVT prophylaxis: Lovenox Code Status: DNR confirmed Family Communication: Discussed with daughter at bedside  Disposition:  Status is: Inpatient Remains inpatient appropriate because:   Will need to be able to eat prior to be able to discharge Therapy  eval 4/28 recommending home health PT     Subjective: Patient was seen and examined today.  She tolerated the food her daughter brought for home.  Having some nausea and vomiting overnight but no nausea and vomiting since morning.   Objective: Vitals:   11/19/22 1524 11/19/22 2034 11/20/22 0444 11/20/22 0847  BP: (!) 126/58 136/70 (!) 118/52 (!) 124/59  Pulse: 70 92 74 80  Resp: 17 18 18 16   Temp: 97.7 F (36.5 C) 98.3 F (36.8 C) 98.5 F (36.9 C) 97.8 F (36.6 C)  TempSrc: Oral Oral Oral   SpO2: 97% 97% 97% 99%  Weight:      Height:        Intake/Output Summary (Last 24 hours) at 11/20/2022 1512 Last data filed at 11/20/2022 0415 Gross per 24 hour  Intake --  Output 2 ml  Net -2 ml    Filed Weights   11/11/22 1508 11/12/22 1305 11/14/22 1413  Weight: 58 kg 61.2 kg 62 kg    Examination:  General.  Frail elderly lady, in no acute distress. Pulmonary.  Lungs clear bilaterally, normal respiratory effort. CV.  Regular rate and rhythm, no JVD, rub or murmur. Abdomen.  Soft, nontender, nondistended, BS positive. CNS.  Alert and oriented .  No focal neurologic deficit. Extremities.  No edema, no cyanosis, pulses intact and symmetrical. Psychiatry.  Judgment and insight appears normal.    Data Reviewed: personally reviewed   CBC    Component Value Date/Time   WBC 6.1 11/20/2022 0402   RBC 3.39 (L) 11/20/2022 0402   HGB 10.6 (L) 11/20/2022 0402   HGB 12.7 03/29/2021 0952   HCT 30.5 (L) 11/20/2022 0402   HCT 38.9 03/29/2021 0952   PLT 230 11/20/2022 0402   PLT  187 03/29/2021 0952   MCV 90.0 11/20/2022 0402   MCV 94 03/29/2021 0952   MCH 31.3 11/20/2022 0402   MCHC 34.8 11/20/2022 0402   RDW 11.7 11/20/2022 0402   RDW 12.3 03/29/2021 0952   LYMPHSABS 0.8 11/20/2022 0402   LYMPHSABS 1.1 03/29/2021 0952   MONOABS 0.9 11/20/2022 0402   EOSABS 0.1 11/20/2022 0402   EOSABS 0.2 03/29/2021 0952   BASOSABS 0.0 11/20/2022 0402   BASOSABS 0.1 03/29/2021 0952       Latest Ref Rng & Units 11/20/2022    4:02 AM 11/19/2022    4:01 AM 11/18/2022    3:05 AM  CMP  Glucose 70 - 99 mg/dL 97  098  119   BUN 8 - 23 mg/dL 8  8  9    Creatinine 0.44 - 1.00 mg/dL 1.47  8.29  5.62   Sodium 135 - 145 mmol/L 133  135  133   Potassium 3.5 - 5.1 mmol/L 3.5  3.6  3.2   Chloride 98 - 111 mmol/L 102  103  102   CO2 22 - 32 mmol/L 21  24  25    Calcium 8.9 - 10.3 mg/dL 8.0  8.6  8.2      Radiology Studies: DG Abd Portable 1V  Result Date: 11/19/2022 CLINICAL DATA:  Vomiting. EXAM: PORTABLE ABDOMEN - 1 VIEW COMPARISON:  11/14/2022 FINDINGS: New midline skin staples are noted consistent with recent surgery. Mild air distended bowel mainly in the small bowel. Findings could be due to a postoperative ileus or recurrent small-bowel obstruction. Recommend correlation with bowel sounds. No obvious free air. IMPRESSION: Mild air distended bowel mainly in the small bowel could be due to a postoperative ileus or recurrent small-bowel obstruction. Electronically Signed   By: Rudie Meyer M.D.   On: 11/19/2022 18:33     Scheduled Meds:  acetaminophen  650 mg Oral Q6H   cloNIDine  0.1 mg Transdermal Weekly   enoxaparin (LOVENOX) injection  40 mg Subcutaneous Q24H   feeding supplement  237 mL Oral BID BM   lidocaine  1 patch Transdermal Q24H   ondansetron (ZOFRAN) IV  4 mg Intravenous Once   potassium chloride  40 mEq Oral Daily   Continuous Infusions:  dextrose 50 mL/hr at 11/20/22 0447   methocarbamol (ROBAXIN) IV       LOS: 9 days   Time spent: 40 minutes.  This record has been created using Conservation officer, historic buildings. Errors have been sought and corrected,but may not always be located. Such creation errors do not reflect on the standard of care.   Arnetha Courser, MD [facility] To contact the attending provider between 7A-7P or the covering provider during after hours 7P-7A, please log into the web site [email or web address] and access using universal [place]  password for that web site. If you do not have the password, please call the hospital operator.  11/20/2022, 3:12 PM

## 2022-11-20 NOTE — Progress Notes (Signed)
Physical Therapy Treatment Patient Details Name: Cheryl Martin MRN: [id number] DOB: 1931-06-03 Today's Date: 11/20/2022   History of Present Illness Pt is an 89 y.o. female admitted 11/11/22 with abdominal pain; workup for SBO. S/p ex lap, lysis of adhesions on 4/26. Of note, recently s/p L3-L4, L4-L5 hemilaminectomy with foraminotomies on 10/11/22. Other PMH includes CAD, TIA, HLD, OA, anemia.    PT Comments    Patient reports feeling much better today than yesterday. She reports no pain today. Ambulation performed in hallway with rolling walker with increased distance walked compared to yesterday. Recommended to continue using rolling walker for safety and routine ambulation for conditioning with staff assistance. PT will continue to follow to maximize independence and decrease caregiver buren. Patient is hopeful to discharge home soon.    Recommendations for follow up therapy are one component of a multi-disciplinary discharge planning process, led by the attending physician.  Recommendations may be updated based on patient status, additional functional criteria and insurance authorization.  Follow Up Recommendations       Assistance Recommended at Discharge Intermittent Supervision/Assistance  Patient can return home with the following Assist for transportation;Help with stairs or ramp for entrance   Equipment Recommendations  None recommended by PT    Recommendations for Other Services       Precautions / Restrictions Precautions Precautions: Fall;Other (comment);Back Precaution Comments: recent back surgery Restrictions Weight Bearing Restrictions: No     Mobility  Bed Mobility Overal bed mobility: Needs Assistance Bed Mobility: Supine to Sit, Sit to Supine     Supine to sit: Supervision Sit to supine: Supervision   General bed mobility comments: encouraged patient to use the logroll technique. increased time but no physical assistance is required     Transfers Overall transfer level: Needs assistance Equipment used: Rolling walker (2 wheels) Transfers: Sit to/from Stand Sit to Stand: Supervision           General transfer comment: 2 bouts of standing, from bed and from toilet. supervision and occasional cues for safety provided    Ambulation/Gait Ambulation/Gait assistance: Supervision Gait Distance (Feet): 140 Feet Assistive device: Rolling walker (2 wheels) Gait Pattern/deviations: Step-through pattern Gait velocity: decreased     General Gait Details: encouraged routine walking for conditioning and continued use of rolling walker for support for safety. no pain reported while walking. no shortness of breath noted   Stairs             Wheelchair Mobility    Modified Rankin (Stroke Patients Only)       Balance Overall balance assessment: Needs assistance Sitting-balance support: Feet supported Sitting balance-Leahy Scale: Good     Standing balance support: No upper extremity supported, During functional activity Standing balance-Leahy Scale: Fair Standing balance comment: brief periods of no UE support while standing at sink to place dentures. no loss of balance                            Cognition Arousal/Alertness: Awake/alert Behavior During Therapy: WFL for tasks assessed/performed Overall Cognitive Status: Within Functional Limits for tasks assessed                                          Exercises      General Comments        Pertinent Vitals/Pain Pain Assessment Pain Assessment: No/denies pain  Home Living                          Prior Function            PT Goals (current goals can now be found in the care plan section) Acute Rehab PT Goals Patient Stated Goal: to go home as soon as she can PT Goal Formulation: With patient Time For Goal Achievement: 11/29/22 Potential to Achieve Goals: Good Progress towards PT goals:  Progressing toward goals    Frequency    Min 3X/week      PT Plan Current plan remains appropriate    Co-evaluation              AM-PAC PT "6 Clicks" Mobility   Outcome Measure  Help needed turning from your back to your side while in a flat bed without using bedrails?: None Help needed moving from lying on your back to sitting on the side of a flat bed without using bedrails?: A Little Help needed moving to and from a bed to a chair (including a wheelchair)?: A Little Help needed standing up from a chair using your arms (e.g., wheelchair or bedside chair)?: A Little Help needed to walk in hospital room?: A Little Help needed climbing 3-5 steps with a railing? : A Little 6 Click Score: 19    End of Session   Activity Tolerance: Patient tolerated treatment well Patient left: in bed;with call bell/phone within reach   PT Visit Diagnosis: Other abnormalities of gait and mobility (R26.89);Muscle weakness (generalized) (M62.81);Difficulty in walking, not elsewhere classified (R26.2)     Time: 1610-9604 PT Time Calculation (min) (ACUTE ONLY): 17 min  Charges:  $Therapeutic Activity: 8-22 mins           Donna Bernard, PT, MPT    Cheryl Martin 11/20/2022, 9:52 AM

## 2022-11-21 DIAGNOSIS — K56609 Unspecified intestinal obstruction, unspecified as to partial versus complete obstruction: Secondary | ICD-10-CM | POA: Diagnosis not present

## 2022-11-21 DIAGNOSIS — E785 Hyperlipidemia, unspecified: Secondary | ICD-10-CM | POA: Diagnosis not present

## 2022-11-21 LAB — BASIC METABOLIC PANEL
Anion gap: 7 (ref 5–15)
BUN: <5 mg/dL — ABNORMAL LOW (ref 8–23)
CO2: 23 mmol/L (ref 22–32)
Calcium: 8.5 mg/dL — ABNORMAL LOW (ref 8.9–10.3)
Chloride: 102 mmol/L (ref 98–111)
Creatinine, Ser: 0.66 mg/dL (ref 0.44–1.00)
GFR, Estimated: >60 mL/min (ref >60)
Glucose, Bld: 94 mg/dL (ref 70–99)
Potassium: 3.5 mmol/L (ref 3.5–5.1)
Sodium: 132 mmol/L — ABNORMAL LOW (ref 135–145)

## 2022-11-21 LAB — GLUCOSE, CAPILLARY: Glucose-Capillary: 106 mg/dL — ABNORMAL HIGH (ref 70–99)

## 2022-11-21 MED ORDER — ENSURE ENLIVE PO LIQD: 237.0000 mL | Freq: Two times a day (BID) | ORAL | 12 refills | Status: DC

## 2022-11-21 MED ORDER — CLONIDINE 0.1 MG/24HR TD PTWK: 0.1000 mg | MEDICATED_PATCH | TRANSDERMAL | 12 refills | Status: DC

## 2022-11-21 MED ORDER — IBUPROFEN 400 MG PO TABS: 400.0000 mg | ORAL_TABLET | Freq: Three times a day (TID) | ORAL | 0 refills | Status: DC | PRN

## 2022-11-21 MED ORDER — SENNOSIDES-DOCUSATE SODIUM 8.6-50 MG PO TABS: 1.0000 | ORAL_TABLET | Freq: Every evening | ORAL | 1 refills | Status: DC | PRN

## 2022-11-21 MED ORDER — POTASSIUM CHLORIDE CRYS ER 20 MEQ PO TBCR: 20.0000 meq | EXTENDED_RELEASE_TABLET | Freq: Every day | ORAL | 0 refills | Status: DC

## 2022-11-21 MED ORDER — LIDOCAINE 5 % EX PTCH: 1.0000 | MEDICATED_PATCH | CUTANEOUS | 0 refills | Status: AC

## 2022-11-21 MED ORDER — ONDANSETRON HCL 4 MG PO TABS: 4.0000 mg | ORAL_TABLET | Freq: Every day | ORAL | 1 refills | Status: DC | PRN

## 2022-11-21 NOTE — Discharge Instructions (Signed)
[facility]      [facility] [phone]  OPEN ABDOMINAL SURGERY: POST OP INSTRUCTIONS  Always review your discharge instruction sheet given to you by the facility where your surgery was performed.  IF YOU HAVE DISABILITY OR FAMILY LEAVE FORMS, YOU MUST BRING THEM TO THE OFFICE FOR PROCESSING.  PLEASE DO NOT GIVE THEM TO YOUR DOCTOR.  A prescription for pain medication may be given to you upon discharge.  Take your pain medication as prescribed, if needed.  If narcotic pain medicine is not needed, then you may take acetaminophen (Tylenol) or ibuprofen (Advil) as needed. Take your usually prescribed medications unless otherwise directed. If you need a refill on your pain medication, please contact your pharmacy. They will contact our office to request authorization.  Prescriptions will not be filled after 5pm or on week-ends. You should follow a light diet the first few days after arrival home, such as soup and crackers, pudding, etc.unless your doctor has advised otherwise. A high-fiber, low fat diet can be resumed as tolerated.   Be sure to include lots of fluids daily. Most patients will experience some swelling and bruising on the chest and neck area.  Ice packs will help.  Swelling and bruising can take several days to resolve Most patients will experience some swelling and bruising in the area of the incision. Ice pack will help. Swelling and bruising can take several days to resolve..  It is common to experience some constipation if taking pain medication after surgery.  Increasing fluid intake and taking a stool softener will usually help or prevent this problem from occurring.  A mild laxative (Milk of Magnesia or Miralax) should be taken according to package directions if there are no bowel movements after 48 hours.  You may have steri-strips (small skin tapes) in place directly over the incision.  These strips should be left on the skin for 7-10 days.  If your surgeon used skin  glue on the incision, you may shower in 24 hours.  The glue will flake off over the next 2-3 weeks.  Any sutures or staples will be removed at the office during your follow-up visit. You may find that a light gauze bandage over your incision may keep your staples from being rubbed or pulled. You may shower and replace the bandage daily. ACTIVITIES:  You may resume regular (light) daily activities beginning the next day--such as daily self-care, walking, climbing stairs--gradually increasing activities as tolerated.  You may have sexual intercourse when it is comfortable.  Refrain from any heavy lifting or straining until approved by your doctor. You may drive when you no longer are taking prescription pain medication, you can comfortably wear a seatbelt, and you can safely maneuver your car and apply brakes Return to Work: ___________________________________ You should see your doctor in the office for a follow-up appointment approximately two weeks after your surgery.  Make sure that you call for this appointment within a day or two after you arrive home to insure a convenient appointment time. OTHER INSTRUCTIONS:  _____________________________________________________________ _____________________________________________________________  WHEN TO CALL YOUR DOCTOR: Fever over 101.0 Inability to urinate Nausea and/or vomiting Extreme swelling or bruising Continued bleeding from incision. Increased pain, redness, or drainage from the incision. Difficulty swallowing or breathing Muscle cramping or spasms. Numbness or tingling in hands or feet or around lips.  The clinic staff is available to answer your questions during regular business hours.  Please don't hesitate to call and ask to speak to one of   the nurses if you have concerns.  For further questions, please visit [email or web address]  

## 2022-11-21 NOTE — Progress Notes (Signed)
Patient ID: Cheryl Martin, female   DOB: 25-Sep-1930, 89 y.o.   MRN: [id number] 7 Days Post-Op    Subjective:  Reports feeling much better, asking to go home. Ate a banana and a sweet potato yesterday, reports drinking plenty of liquids.  ROS negative except as listed above. Objective: Vital signs in last 24 hours: Temp:  [98 F (36.7 C)-98.4 F (36.9 C)] 98 F (36.7 C) (05/02 0754) Pulse Rate:  [71-86] 86 (05/02 0754) Resp:  [16-17] 17 (05/02 0430) BP: (114-147)/(60-92) 147/60 (05/02 0754) SpO2:  [98 %-100 %] 99 % (05/02 0754) Last BM Date : 11/20/22  Intake/Output from previous day: No intake/output data recorded. Intake/Output this shift: No intake/output data recorded.  Gen: alert, elderly female, appears younger than stated age Pulm: normal effort on room air Abd: soft, nondistended, appropriately tender, staples c/d/I without drainage or cellulitis   Lab Results: CBC  Recent Labs    11/20/22 0402  WBC 6.1  HGB 10.6*  HCT 30.5*  PLT 230   BMET Recent Labs    11/19/22 0401 11/20/22 0402  NA 135 133*  K 3.6 3.5  CL 103 102  CO2 24 21*  GLUCOSE 105* 97  BUN 8 8  CREATININE 0.64 0.62  CALCIUM 8.6* 8.0*   PT/INR No results for input(s): "LABPROT", "INR" in the last 72 hours. ABG No results for input(s): "PHART", "HCO3" in the last 72 hours.  Invalid input(s): "PCO2", "PO2"  Studies/Results: DG Abd Portable 1V  Result Date: 11/19/2022 CLINICAL DATA:  Vomiting. EXAM: PORTABLE ABDOMEN - 1 VIEW COMPARISON:  11/14/2022 FINDINGS: New midline skin staples are noted consistent with recent surgery. Mild air distended bowel mainly in the small bowel. Findings could be due to a postoperative ileus or recurrent small-bowel obstruction. Recommend correlation with bowel sounds. No obvious free air. IMPRESSION: Mild air distended bowel mainly in the small bowel could be due to a postoperative ileus or recurrent small-bowel obstruction. Electronically Signed   By: Rudie Meyer M.D.   On: 11/19/2022 18:33    Anti-infectives: Anti-infectives (From admission, onward)    Start     Dose/Rate Route Frequency Ordered Stop   11/14/22 1300  cefoTEtan (CEFOTAN) 2 g in sodium chloride 0.9 % 100 mL IVPB       Note to Pharmacy: Pharmacy may adjust dose strength for optimal dosing.   Send with patient on call to the OR.  Anesthesia to complete antibiotic administration <64min prior to incision per [facility].   2 g 200 mL/hr over 30 Minutes Intravenous On call to O.R. 11/14/22 0914 11/14/22 1526       Assessment/Plan: EXPLORATORY LAPAROTOMY, LYSIS OF ADHESIONS 11/14/2022 for SBO POD#7 Ileus improved, tolerating PO, walked twice yesterday, having bowel function.  Moving well with therapies - ambulating using RW Patient may shower  Continue to maximize non-narcotic pain control with tylenol, lidoderm, PRN advil  Stable for discharge. Follow up appointments and instructions provided. Non-narcotic pain meds reconciled.    LOS: 10 days    Adam Phenix, PA-C   Use [email or web address] to contact on call provider  11/21/2022

## 2022-11-21 NOTE — Plan of Care (Signed)
  Problem: Education: Goal: Knowledge of General Education information will improve Description: Including pain rating scale, medication(s)/side effects and non-pharmacologic comfort measures 11/21/2022 1106 by Doyce Para, RN Outcome: Adequate for Discharge 11/21/2022 1106 by Doyce Para, RN Outcome: Adequate for Discharge   Problem: Health Behavior/Discharge Planning: Goal: Ability to manage health-related needs will improve 11/21/2022 1106 by Doyce Para, RN Outcome: Adequate for Discharge 11/21/2022 1106 by Doyce Para, RN Outcome: Adequate for Discharge   Problem: Clinical Measurements: Goal: Ability to maintain clinical measurements within normal limits will improve 11/21/2022 1106 by Doyce Para, RN Outcome: Adequate for Discharge 11/21/2022 1106 by Doyce Para, RN Outcome: Adequate for Discharge Goal: Will remain free from infection 11/21/2022 1106 by Doyce Para, RN Outcome: Adequate for Discharge 11/21/2022 1106 by Doyce Para, RN Outcome: Adequate for Discharge Goal: Diagnostic test results will improve 11/21/2022 1106 by Doyce Para, RN Outcome: Adequate for Discharge 11/21/2022 1106 by Doyce Para, RN Outcome: Adequate for Discharge Goal: Respiratory complications will improve 11/21/2022 1106 by Doyce Para, RN Outcome: Adequate for Discharge 11/21/2022 1106 by Doyce Para, RN Outcome: Adequate for Discharge Goal: Cardiovascular complication will be avoided 11/21/2022 1106 by Doyce Para, RN Outcome: Adequate for Discharge 11/21/2022 1106 by Doyce Para, RN Outcome: Adequate for Discharge   Problem: Activity: Goal: Risk for activity intolerance will decrease 11/21/2022 1106 by Doyce Para, RN Outcome: Adequate for Discharge 11/21/2022 1106 by Doyce Para, RN Outcome: Adequate for Discharge   Problem: Nutrition: Goal: Adequate nutrition will be  maintained 11/21/2022 1106 by Doyce Para, RN Outcome: Adequate for Discharge 11/21/2022 1106 by Doyce Para, RN Outcome: Adequate for Discharge   Problem: Coping: Goal: Level of anxiety will decrease 11/21/2022 1106 by Doyce Para, RN Outcome: Adequate for Discharge 11/21/2022 1106 by Doyce Para, RN Outcome: Adequate for Discharge   Problem: Elimination: Goal: Will not experience complications related to bowel motility 11/21/2022 1106 by Doyce Para, RN Outcome: Adequate for Discharge 11/21/2022 1106 by Doyce Para, RN Outcome: Adequate for Discharge Goal: Will not experience complications related to urinary retention 11/21/2022 1106 by Doyce Para, RN Outcome: Adequate for Discharge 11/21/2022 1106 by Doyce Para, RN Outcome: Adequate for Discharge   Problem: Pain Managment: Goal: General experience of comfort will improve 11/21/2022 1106 by Doyce Para, RN Outcome: Adequate for Discharge 11/21/2022 1106 by Doyce Para, RN Outcome: Adequate for Discharge   Problem: Safety: Goal: Ability to remain free from injury will improve 11/21/2022 1106 by Doyce Para, RN Outcome: Adequate for Discharge 11/21/2022 1106 by Doyce Para, RN Outcome: Adequate for Discharge   Problem: Skin Integrity: Goal: Risk for impaired skin integrity will decrease 11/21/2022 1106 by Doyce Para, RN Outcome: Adequate for Discharge 11/21/2022 1106 by Doyce Para, RN Outcome: Adequate for Discharge

## 2022-11-21 NOTE — Discharge Summary (Signed)
Physician Discharge Summary   Patient: Cheryl Martin MRN: [id number] DOB: 1930/09/02  Admit date:     11/11/2022  Discharge date: 11/21/22  Discharge Physician: Arnetha Courser   PCP: [name], PA   Recommendations at discharge:  Please obtain CBC and BMP within a week.  If you see potassium levels trending up please discontinue supplement. Patient is also being started on clonidine patches for high blood pressure, please titrate or make necessary changes as appropriate. Follow-up with general surgery Follow-up with primary care provider  Discharge Diagnoses: Principal Problem:   Small bowel obstruction (HCC) Active Problems:   ADD (attention deficit disorder)   Hyperlipidemia   Hospital Course: 89 year old white female recent L3-L4, L4-L5 hemilaminectomy with foraminotomies status post surgery Dr. [name], Presumed TIA 11/2021, Hypovolemic hyponatremia HLD, Urge incontinence followed by urology in [facility] Prior cholecystectomy/hysterectomy-present emergency room/20 08/2022 abdominal pain, N, V,  CT scan ABD pelvis = early SBO + dilated small bowel loops pelvis + right sided inguinal hernia.  General surgery was consulted and initially give her a trial of Gastrografin.  It did show bowel obstruction without improvement with conservative management so she was taken to the OR s/p laparotomy and lysis of adhesions on 11/14/2022. Patient remained on NG tube until 11/18/2022 due to slow improvement.  She gradually improved, we slowly advanced her diet and currently she is tolerating soft diet.  No more nausea and vomiting.  Having bowel movements.  She was also found to have persistently elevated blood pressure so started on clonidine patch which helped with the control of blood pressure.  She need to follow-up with her primary care provider for further management or change in therapy as appropriate.  Patient is also having persistent hypokalemia so started on daily potassium  supplement.  Potassium on the day of discharge was 3.5.  Her PCP can monitor and make changes as appropriate.  Patient will continue on current medications and need to have a close follow-up with her providers for further recommendations.  Consultants: General surgery Procedures performed: Laparotomy with lysis of adhesion Disposition: Home health Diet recommendation:  Discharge Diet Orders (From admission, onward)     Start     Ordered   11/21/22 0000  Diet - low sodium heart healthy        11/21/22 1036           Cardiac diet DISCHARGE MEDICATION: Allergies as of 11/21/2022       Reactions   Oxybutynin Other (See Comments)   Cannot tolerate at higher doses, caused dehydration    Tape Other (See Comments)   Skin will tear with medical tape, but can tolerate paper tape only   Beef-derived Products    Personal preference   Flexeril [cyclobenzaprine] Other (See Comments)   Caused excessive lethargy   Mold Extract [trichophyton] Other (See Comments)   Runny nose (with dust, also)   Pollen Extract Other (See Comments)   Runny nose        Medication List     STOP taking these medications    traMADol 50 MG tablet Commonly known as: Ultram       TAKE these medications    acetaminophen 500 MG tablet Commonly known as: TYLENOL Take 1,000 mg by mouth every 6 (six) hours as needed for moderate pain or headache.   Aspirin Low Dose 81 MG tablet Generic drug: aspirin EC Take 1 tablet (81 mg total) by mouth daily. Swallow whole.   B-12 1000 MCG Subl Place 2,000  mcg under the tongue daily.   BIOTIN PO Take 1 tablet by mouth daily.   cloNIDine 0.1 mg/24hr patch Commonly known as: CATAPRES - Dosed in mg/24 hr Place 1 patch (0.1 mg total) onto the skin once a week. Start taking on: Nov 27, 2022   feeding supplement Liqd Take 237 mLs by mouth 2 (two) times daily between meals.   ibuprofen 400 MG tablet Commonly known as: ADVIL Take 1 tablet (400 mg total) by  mouth every 8 (eight) hours as needed for fever, headache, mild pain or moderate pain.   lidocaine 5 % Commonly known as: LIDODERM Place 1 patch onto the skin daily for 7 days. Remove & Discard patch within 12 hours Start taking on: Nov 22, 2022   multivitamin with minerals Tabs tablet Take 1 tablet by mouth daily.   MURO 128 OP Place 1 drop into both eyes 2 (two) times daily as needed (dry eyes).   nitroGLYCERIN 0.4 MG SL tablet Commonly known as: NITROSTAT Place 1 tablet (0.4 mg total) under the tongue every 5 (five) minutes as needed for chest pain.   ondansetron 4 MG tablet Commonly known as: Zofran Take 1 tablet (4 mg total) by mouth daily as needed for nausea or vomiting.   potassium chloride SA 20 MEQ tablet Commonly known as: KLOR-CON M Take 1 tablet (20 mEq total) by mouth daily. Start taking on: Nov 22, 2022   senna-docusate 8.6-50 MG tablet Commonly known as: Senokot-S Place 1 tablet into feeding tube at bedtime as needed for moderate constipation.   Vitamin C Chew Chew 1 tablet by mouth daily.   Vitamin D-3 25 MCG (1000 UT) Caps Take 1,000 Units by mouth daily with breakfast.        Follow-up Information     [facility] Follow up.   Why: [facility] will continue to provide home health services.        Surgery, [facility] Follow up on 11/28/2022.   Specialty: General Surgery Why: at 9:30 AM for staple removal. please arrive 20 minutes early. Contact information: [address] [place] [address] [phone]         Andria Meuse, MD. Go on 12/13/2022.   Specialties: General Surgery, Colon and Rectal Surgery Why: at 11:00 AM for follow up with the surgeon. please arrive 20 minutes early. Contact information: [address] [place] [address] [phone]         [name], PA. Schedule an appointment as soon as possible for a visit in 1 week(s).   Specialty:  Family Medicine Contact information: [address] [place] [address] [phone]                Discharge Exam: Ceasar Mons Weights   11/11/22 1508 11/12/22 1305 11/14/22 1413  Weight: 58 kg 61.2 kg 62 kg   General.  Frail elderly lady, in no acute distress. Pulmonary.  Lungs clear bilaterally, normal respiratory effort. CV.  Regular rate and rhythm, no JVD, rub or murmur. Abdomen.  Soft, nontender, nondistended, BS positive. CNS.  Alert and oriented .  No focal neurologic deficit. Extremities.  No edema, no cyanosis, pulses intact and symmetrical. Psychiatry.  Judgment and insight appears normal.   Condition at discharge: stable  The results of significant diagnostics from this hospitalization (including imaging, microbiology, ancillary and laboratory) are listed below for reference.   Imaging Studies: DG Abd Portable 1V  Result  Date: 11/19/2022 CLINICAL DATA:  Vomiting. EXAM: PORTABLE ABDOMEN - 1 VIEW COMPARISON:  11/14/2022 FINDINGS: New midline skin staples are noted consistent with recent surgery. Mild air distended bowel mainly in the small bowel. Findings could be due to a postoperative ileus or recurrent small-bowel obstruction. Recommend correlation with bowel sounds. No obvious free air. IMPRESSION: Mild air distended bowel mainly in the small bowel could be due to a postoperative ileus or recurrent small-bowel obstruction. Electronically Signed   By: Rudie Meyer M.D.   On: 11/19/2022 18:33   CUP PACEART REMOTE DEVICE CHECK  Result Date: 11/18/2022 ILR summary report received. Battery status OK. Normal device function. No new symptom, tachy, brady, or pause episodes. No new AF episodes.  AF burden is 0% of the time.   Monthly summary reports and ROV/PRN Hassell Halim, RN, CCDS, [employer]  DG Abd Portable 1V  Result Date: 11/14/2022 CLINICAL DATA:  Small-bowel obstruction. EXAM: PORTABLE ABDOMEN - 1 VIEW COMPARISON:  Radiographs 11/13/2022 and  11/12/2022.  CT 11/11/2022. FINDINGS: 0824 hours. Enteric tube is unchanged, overlying the mid lumbar spine and likely within the mid stomach. Previously administered enteric contrast remains within moderately dilated small bowel in the pelvis. There is a persistent dilated, air-filled loop of small bowel in the left upper quadrant. No significant colonic contrast identified. There is stool in the right colon. Surgical clips are present in the right upper quadrant. No evidence of extravasated enteric contrast or pneumoperitoneum. Moderate convex left thoracolumbar scoliosis again noted. IMPRESSION: No significant change in findings of small-bowel obstruction. Enteric contrast remains within moderately dilated small bowel loops in the pelvis. No significant colonic contrast identified. Electronically Signed   By: [place] Bullocks M.D.   On: 11/14/2022 08:39   DG Abd Portable 1V-Small Bowel Obstruction Protocol-initial, 8 hr delay  Result Date: 11/13/2022 CLINICAL DATA:  Delayed images after contrast administration. EXAM: PORTABLE ABDOMEN - 1 VIEW COMPARISON:  Previous examination done earlier today FINDINGS: There is dilation of small-bowel loops measuring up to 4 cm in diameter. Oral contrast is still in the lumen of dilated small bowel loops. Oral contrast has not reached the right colon. Tip of NG tube is seen in the stomach. Surgical clips are seen in right upper quadrant. Rotoscoliosis is seen thoracolumbar junction with convexity to the left. IMPRESSION: Oral contrast remains within dilated small bowel loops suggesting partial small bowel obstruction. Electronically Signed   By: Ernie Avena M.D.   On: 11/13/2022 19:26   DG Abd 1 View  Result Date: 11/13/2022 CLINICAL DATA:  Small-bowel obstruction EXAM: ABDOMEN - 1 VIEW COMPARISON:  Portable exam 0645 hours compared to 11/12/2022 FINDINGS: Surgical clips RIGHT upper quadrant consistent with cholecystectomy. Tip of nasogastric tube projects over  proximal stomach. Dilated air-filled small bowel loops in the LEFT upper quadrant and LEFT mid abdomen consistent with small bowel obstruction. No bowel wall thickening. Small amount of scattered colonic gas and stool. Osseous demineralization with levoconvex thoracolumbar scoliosis. Atherosclerotic calcification aorta. IMPRESSION: Persistent small bowel obstruction. Electronically Signed   By: Ulyses Southward M.D.   On: 11/13/2022 08:45   DG Abd Portable 1V-Small Bowel Obstruction Protocol-initial, 8 hr delay  Result Date: 11/12/2022 CLINICAL DATA:  Evaluate NG placement EXAM: PORTABLE ABDOMEN - 1 VIEW COMPARISON:  Earlier today. FINDINGS: The enteric tube tip and side port are below the level of the GE junction. The tip of the tube is in the expected location of the gastric fundus. Paucity bowel gas limits assessment for degree  of bowel dilatation. Surgical clips are again seen within the right hemiabdomen. Scoliosis deformity in lumbar degenerative disc disease. IMPRESSION: Tip and side port of enteric tube are below the level of the GE junction. Tip of the NG tube is in the gastric fundus. Electronically Signed   By: Signa Kell M.D.   On: 11/12/2022 16:21   DG Abd Portable 1 View  Result Date: 11/12/2022 CLINICAL DATA:  Nasogastric tube placement EXAM: PORTABLE ABDOMEN - 1 VIEW COMPARISON:  None Available. FINDINGS: Nasogastric tube tip overlies the proximal to mid body of the stomach. Moderate hiatal hernia. Visualized lung bases are clear. Implanted loop recorder noted. Cholecystectomy clips seen in the right upper quadrant. Moderate lumbar levoscoliosis. IMPRESSION: 1. Nasogastric tube tip within the proximal to mid body of the stomach. Electronically Signed   By: Helyn Numbers M.D.   On: 11/12/2022 03:24   CT ABDOMEN PELVIS W CONTRAST  Result Date: 11/11/2022 CLINICAL DATA:  Acute nonlocalized abdominal pain EXAM: CT ABDOMEN AND PELVIS WITH CONTRAST TECHNIQUE: Multidetector CT imaging of the  abdomen and pelvis was performed using the standard protocol following bolus administration of intravenous contrast. RADIATION DOSE REDUCTION: This exam was performed according to the departmental dose-optimization program which includes automated exposure control, adjustment of the mA and/or kV according to patient size and/or use of iterative reconstruction technique. CONTRAST:  75mL OMNIPAQUE IOHEXOL 350 MG/ML SOLN COMPARISON:  10/23/2020 FINDINGS: Lower chest: Mild dependent atelectasis in the lung bases. Large esophageal hiatal hernia. Hepatobiliary: Surgical absence of the gallbladder with bile duct dilatation, likely postoperative. No change since prior study. No focal liver lesions. Pancreas: Unremarkable. No pancreatic ductal dilatation or surrounding inflammatory changes. Spleen: Normal in size without focal abnormality. Adrenals/Urinary Tract: Adrenal glands are unremarkable. Kidneys are normal, without renal calculi, focal lesion, or hydronephrosis. Bladder is unremarkable. Stomach/Bowel: Stomach is fluid-filled without wall thickening. Pelvic small bowel loops are mildly dilated and fluid-filled without wall thickening. This is developing since prior study and could indicate localized ileus, enteritis, or early or partial obstruction. Terminal ileum is decompressed. There is a small right inguinal hernia containing small bowel which could be the cause of obstruction. Colon is decompressed. Colonic diverticula without evidence of acute diverticulitis. Vascular/Lymphatic: Prominent aortic calcification. No aneurysm or dissection. No significant lymphadenopathy. Reproductive: No pelvic masses. Other: No free air or free fluid in the abdomen. Abdominal wall musculature appears intact. Musculoskeletal: Degenerative changes in the spine. Lumbar scoliosis convex to the left. Mild anterior subluxation of L4 on L5. Postoperative changes with apparent posterior hemilaminectomy at L4-5. laminectomy appears to have  been performed since the previous study. Fluid tracks from the post laminectomy space into the soft tissues dorsal to the spine, likely representing postoperative collection. Injection granulomas in the soft tissues. IMPRESSION: 1. Dilated fluid-filled pelvic small bowel suggesting early or partial obstruction, possibly due to a right inguinal hernia. Changes could also indicate enteritis or ileus. 2. Apparent interval postoperative laminectomy at L4-5 with overlying soft tissue collection likely representing a postoperative collection. 3. Large esophageal hiatal hernia. 4. Prominent aortic atherosclerosis. Electronically Signed   By: Burman Nieves M.D.   On: 11/11/2022 18:10    Microbiology: Results for orders placed or performed during the hospital encounter of 10/04/22  Surgical pcr screen     Status: None   Collection Time: 10/04/22 11:49 AM   Specimen: Nasal Mucosa; Nasal Swab  Result Value Ref Range Status   MRSA, PCR NEGATIVE NEGATIVE Final   Staphylococcus aureus NEGATIVE NEGATIVE Final  Comment: (NOTE) The Xpert SA Assay (FDA approved for NASAL specimens in patients 100 years of age and older), is one component of a comprehensive surveillance program. It is not intended to diagnose infection nor to guide or monitor treatment. Performed at [facility], [address] [place], [place] [address]     Labs: CBC: Recent Labs  Lab 11/15/22 [phone] 11/16/22 0357 11/17/22 0311 11/20/22 0402  WBC 5.3 4.0 5.9 6.1  NEUTROABS 3.8 2.5  --  4.3  HGB 11.5* 10.7* 10.3* 10.6*  HCT 35.0* 32.5* 30.0* 30.5*  MCV 94.6 95.6 92.6 90.0  PLT 172 157 135* 230   Basic Metabolic Panel: Recent Labs  Lab 11/15/22 0723 11/16/22 0357 11/17/22 0311 11/18/22 0305 11/19/22 0401 11/20/22 0402 11/21/22 0751  NA 141 138 133* 133* 135 133* 132*  K 3.2* 3.4* 3.6 3.2* 3.6 3.5 3.5  CL 103 105 101 102 103 102 102  CO2 27 29 22 25 24  21* 23  GLUCOSE 121* 111* 108* 103* 105* 97 94  BUN 16 16 12  9 8 8  <5*  CREATININE 0.79 0.71 0.57 0.60 0.64 0.62 0.66  CALCIUM 8.8* 8.5* 8.3* 8.2* 8.6* 8.0* 8.5*  MG  --   --   --  1.6*  --   --   --   PHOS 3.2 1.8*  --   --   --   --   --    Liver Function Tests: Recent Labs  Lab 11/15/22 0723 11/16/22 0357  ALBUMIN 2.8* 2.6*   CBG: Recent Labs  Lab 11/19/22 0549 11/19/22 1216 11/19/22 1826 11/20/22 0007 11/20/22 1208  GLUCAP 103* 94 89 105* 87    Discharge time spent: greater than 30 minutes.  This record has been created using Conservation officer, historic buildings. Errors have been sought and corrected,but may not always be located. Such creation errors do not reflect on the standard of care.   Signed: Arnetha Courser, MD [facility] 11/21/2022

## 2022-11-25 NOTE — Progress Notes (Signed)
[facility] Summary Report / Loop Recorder 

## 2022-11-26 ENCOUNTER — Telehealth: Payer: Self-pay | Admitting: *Deleted

## 2022-11-26 NOTE — Telephone Encounter (Signed)
Perfect, I will sign the fax once they send it over.

## 2022-11-26 NOTE — Telephone Encounter (Signed)
[facility] calling to see about restarting care after pt discharge. Gave the verbal for nursing and PT to evaluate. There will be a follow up fax as well for this.  They did not restart upon discharge and PT had not been to evaluate prior to admission.

## 2022-11-27 ENCOUNTER — Telehealth: Payer: Self-pay | Admitting: *Deleted

## 2022-11-27 NOTE — Telephone Encounter (Signed)
Faxed cardiac clearance to Dr Camnitz/Dr. Lynnette Caffey office. Waiting response.

## 2022-11-27 NOTE — Telephone Encounter (Signed)
   Name: Cheryl Martin  DOB: 05-30-1931  MRN: [id number]  Primary Cardiologist: Orbie Pyo, MD   Preoperative team, please contact this patient and set up a phone call appointment for further preoperative risk assessment. Please obtain consent and complete medication review. Thank you for your help.  I confirm that guidance regarding antiplatelet and oral anticoagulation therapy has been completed and, if necessary, noted below.  Aspirin prescribed by a noncardiology provider.  Recommendation for holding aspirin deferred to prescribing provider.     Carlos Levering, NP 11/27/2022, 5:10 PM [facility]

## 2022-11-27 NOTE — Telephone Encounter (Signed)
   Pre-operative Risk Assessment    Patient Name: Cheryl Martin  DOB: 04/25/1931 MRN: [id number]      Request for Surgical Clearance    Procedure:   BOTOX INJECTIONS IN THE BLADDER WALL  Date of Surgery:  Clearance 02/04/23                                 Surgeon:   Surgeon's Group or Practice Name:  [facility] Phone number:  [phone] Fax number:  [phone]   Type of Clearance Requested:   - Pharmacy:  Hold Aspirin NOT INDICATED HOW LONG   Type of Anesthesia:  Not Indicated   Additional requests/questions:  PT DOES HAVE A [employer] ILR, DO NEED TO SEND TO DEVICE?  Signed, Elliot Cousin   11/27/2022, 4:12 PM

## 2022-11-28 ENCOUNTER — Telehealth: Payer: Self-pay | Admitting: *Deleted

## 2022-11-28 NOTE — Telephone Encounter (Signed)
Hi,  I dont think we need these forms. Just wanted to message back since I have received 2 today.

## 2022-11-28 NOTE — Telephone Encounter (Signed)
Left message on machine for pt to contact the office to schedule telephone clearance and needs to be sent to the device pool for clearance.

## 2022-11-28 NOTE — Telephone Encounter (Signed)
S/w pt and pt's daughter. Scheduled telephone clearance and sent to device.     Patient Consent for Virtual Visit         Cheryl Martin has provided verbal consent on 11/28/2022 for a virtual visit (video or telephone).   CONSENT FOR VIRTUAL VISIT FOR:  Cheryl Martin  By participating in this virtual visit I agree to the following:  I hereby voluntarily request, consent and authorize [facility] and its employed or contracted physicians, physician assistants, nurse practitioners or other licensed health care professionals (the Practitioner), to provide me with telemedicine health care services (the "Services") as deemed necessary by the treating Practitioner. I acknowledge and consent to receive the Services by the Practitioner via telemedicine. I understand that the telemedicine visit will involve communicating with the Practitioner through live audiovisual communication technology and the disclosure of certain medical information by electronic transmission. I acknowledge that I have been given the opportunity to request an in-person assessment or other available alternative prior to the telemedicine visit and am voluntarily participating in the telemedicine visit.  I understand that I have the right to withhold or withdraw my consent to the use of telemedicine in the course of my care at any time, without affecting my right to future care or treatment, and that the Practitioner or I may terminate the telemedicine visit at any time. I understand that I have the right to inspect all information obtained and/or recorded in the course of the telemedicine visit and may receive copies of available information for a reasonable fee.  I understand that some of the potential risks of receiving the Services via telemedicine include:  Delay or interruption in medical evaluation due to technological equipment failure or disruption; Information transmitted may not be sufficient (e.g. poor  resolution of images) to allow for appropriate medical decision making by the Practitioner; and/or  In rare instances, security protocols could fail, causing a breach of personal health information.  Furthermore, I acknowledge that it is my responsibility to provide information about my medical history, conditions and care that is complete and accurate to the best of my ability. I acknowledge that Practitioner's advice, recommendations, and/or decision may be based on factors not within their control, such as incomplete or inaccurate data provided by me or distortions of diagnostic images or specimens that may result from electronic transmissions. I understand that the practice of medicine is not an exact science and that Practitioner makes no warranties or guarantees regarding treatment outcomes. I acknowledge that a copy of this consent can be made available to me via my patient portal [facility] MyChart), or I can request a printed copy by calling the office of [facility].    I understand that my insurance will be billed for this visit.   I have read or had this consent read to me. I understand the contents of this consent, which adequately explains the benefits and risks of the Services being provided via telemedicine.  I have been provided ample opportunity to ask questions regarding this consent and the Services and have had my questions answered to my satisfaction. I give my informed consent for the services to be provided through the use of telemedicine in my medical care

## 2022-11-29 NOTE — Telephone Encounter (Signed)
Patient advised and understands, she will keep one appointment and take her chances.

## 2022-11-29 NOTE — Telephone Encounter (Signed)
She can have at the same day as long as she understands if there is any abnormality whatsoever, we will not be able to proceed.  Vanna Scotland, MD

## 2022-11-29 NOTE — Telephone Encounter (Signed)
Called patient to let her know I sent cardiac clearance to cardiologist, also advised patient we need to get UA done about 2 weeks prior to her appointment. Patient [place] and it is a lot for her to come in to our office just for a UA and that last time we were able to get this done at the same day as her appointment. I explained the reasoning behind getting this ahead of time. I advised patient I would ask Dr Apolinar Junes if it is ok to do UA on the same day and would call her back to let her know

## 2022-12-04 ENCOUNTER — Ambulatory Visit (INDEPENDENT_AMBULATORY_CARE_PROVIDER_SITE_OTHER): Payer: Medicare HMO | Admitting: Family Medicine

## 2022-12-04 ENCOUNTER — Encounter: Payer: Self-pay | Admitting: Family Medicine

## 2022-12-04 VITALS — BP 94/57 | HR 66 | Ht 63.0 in | Wt 123.4 lb

## 2022-12-04 DIAGNOSIS — Z09 Encounter for follow-up examination after completed treatment for conditions other than malignant neoplasm: Secondary | ICD-10-CM | POA: Diagnosis not present

## 2022-12-04 DIAGNOSIS — K56609 Unspecified intestinal obstruction, unspecified as to partial versus complete obstruction: Secondary | ICD-10-CM

## 2022-12-04 NOTE — Patient Instructions (Addendum)
Take MiraLAX 3 times a day.  You should be drinking 8 to 10 glasses of water every day.  I would also recommend trying a glass of apple juice or prune juice every day.  STOP using the clonidine patch. Check your blood pressure at home to make sure your blood pressure does not become higher than 130/80.

## 2022-12-04 NOTE — Progress Notes (Signed)
Established Patient Office Visit  Subjective   Patient ID: Cheryl Martin, female    DOB: [birth year]  Age: 89 y.o. MRN: [id number]  Chief Complaint  Patient presents with   Hospitalization Follow-up   HPI Cheryl Martin is a 89 y.o. female presenting today for follow up of hospitalization from 11/11/2022 through 11/21/2022.  Patient presented to the ER with abdominal pain that started after lunch 3 weeks after back surgery.  Workup positive for hypovolemic hyponatremia, CT abdomen pelvis demonstrated early small bowel obstruction and dilated small bowel loops.  Underwent exploratory laparotomy on 11/14/2022, remained on NG tube until 11/18/2022 due to slow improvement.  Diet was slowly advanced at discharge tolerated soft diet with no nausea or vomiting.  She was having bowel movements on discharge.  Today, presents for hospital follow-up and reports that it has been 3 days since her last bowel movement.  She states she has not had regular bowel movements since before she went to the hospital.  Since discharge, she endorses abdominal pain and constipation with occasional nausea.  She has used a Fleet enema which resulted in a loose stool, and when she had her staples removed with general surgery the nurse recommended MiraLAX 3 times a day.  She has taken MiraLAX just once daily as she was hesitant to take it so often.  She has taken Tylenol for pain but no prescription medication.  ROS Negative unless otherwise noted in HPI   Objective:     BP (!) 94/57   Pulse 66   Ht 5\' 3"  (1.6 m)   Wt 123 lb 6.4 oz (56 kg)   SpO2 98%   BMI 21.86 kg/m   Physical Exam Constitutional:      General: She is not in acute distress.    Appearance: Normal appearance.  HENT:     Head: Normocephalic and atraumatic.  Cardiovascular:     Rate and Rhythm: Normal rate and regular rhythm.     Pulses: Normal pulses.     Heart sounds: No murmur heard.    No friction rub. No gallop.  Pulmonary:     Effort:  Pulmonary effort is normal. No respiratory distress.     Breath sounds: No wheezing, rhonchi or rales.  Abdominal:     General: Abdomen is flat. A surgical scar is present. Bowel sounds are normal.     Palpations: Abdomen is soft. There is no hepatomegaly or splenomegaly.     Tenderness: There is abdominal tenderness in the right upper quadrant, right lower quadrant, left upper quadrant and left lower quadrant. There is no guarding or rebound.  Skin:    General: Skin is warm and dry.  Neurological:     Mental Status: She is alert and oriented to person, place, and time.     Assessment & Plan:  Hospital discharge follow-up -     CBC with Differential/Platelet; Future -     Comprehensive metabolic panel; Future  SBO (small bowel obstruction) (HCC) -     CBC with Differential/Platelet; Future -     Comprehensive metabolic panel; Future  BP soft in the office today, discontinue clonidine patch.  Recommend ambulatory BP monitoring at home.  Checking CBC and CBC as requested by hospitalist in discharge note.  If potassium levels are trending up, discontinue supplement.  For ongoing constipation, agree with general surgery recommendation for MiraLAX 3 times daily until her appointment with them next week.  I also emphasized the importance of maintaining adequate hydration.  She should be drinking 8 to 10 glasses of water every day.  I suggested also adding a glass of apple juice or orange juice to help with constipation.  She does struggle with chronic constipation as well.  When she does see general surgery and they ensure that she is recovering as expected after her procedure.  We may consider discussing other management options for chronic constipation such as daily Metamucil or lubiprostone 24 mcg twice daily.  Return in about 4 months (around 04/06/2023) for follow-up for HLD, fasting blood work 1 week before.   I spent 70 minutes on the day of the encounter to include pre-visit record  review of hospitalization and surgery notes, face-to-face time with the patient discussing current symptoms and management options, and post visit ordering of tests.  [name], PA

## 2022-12-06 ENCOUNTER — Other Ambulatory Visit: Payer: Medicare HMO

## 2022-12-06 DIAGNOSIS — Z09 Encounter for follow-up examination after completed treatment for conditions other than malignant neoplasm: Secondary | ICD-10-CM | POA: Diagnosis not present

## 2022-12-06 DIAGNOSIS — K56609 Unspecified intestinal obstruction, unspecified as to partial versus complete obstruction: Secondary | ICD-10-CM | POA: Diagnosis not present

## 2022-12-07 LAB — COMPREHENSIVE METABOLIC PANEL
ALT: 9 IU/L (ref 0–32)
AST: 16 IU/L (ref 0–40)
Albumin/Globulin Ratio: 2.4 — ABNORMAL HIGH (ref 1.2–2.2)
Albumin: 3.8 g/dL (ref 3.7–4.7)
Alkaline Phosphatase: 95 IU/L (ref 44–121)
BUN/Creatinine Ratio: 14 (ref 12–28)
BUN: 11 mg/dL (ref 8–27)
Bilirubin Total: 0.3 mg/dL (ref 0.0–1.2)
CO2: 24 mmol/L (ref 20–29)
Calcium: 9.6 mg/dL (ref 8.7–10.3)
Chloride: 100 mmol/L (ref 96–106)
Creatinine, Ser: 0.76 mg/dL (ref 0.57–1.00)
Globulin, Total: 1.6 g/dL (ref 1.5–4.5)
Glucose: 94 mg/dL (ref 70–99)
Potassium: 4.8 mmol/L (ref 3.5–5.2)
Sodium: 136 mmol/L (ref 134–144)
Total Protein: 5.4 g/dL — ABNORMAL LOW (ref 6.0–8.5)
eGFR: 75 mL/min/{1.73_m2} (ref >59)

## 2022-12-07 LAB — CBC WITH DIFFERENTIAL/PLATELET
Basophils Absolute: 0 10*3/uL (ref 0.0–0.2)
Basos: 1 %
EOS (ABSOLUTE): 0.1 10*3/uL (ref 0.0–0.4)
Eos: 2 %
Hematocrit: 34.7 % (ref 34.0–46.6)
Hemoglobin: 11.8 g/dL (ref 11.1–15.9)
Immature Grans (Abs): 0 10*3/uL (ref 0.0–0.1)
Immature Granulocytes: 0 %
Lymphocytes Absolute: 1.1 10*3/uL (ref 0.7–3.1)
Lymphs: 22 %
MCH: 31.2 pg (ref 26.6–33.0)
MCHC: 34 g/dL (ref 31.5–35.7)
MCV: 92 fL (ref 79–97)
Monocytes Absolute: 0.7 10*3/uL (ref 0.1–0.9)
Monocytes: 14 %
Neutrophils Absolute: 3.1 10*3/uL (ref 1.4–7.0)
Neutrophils: 61 %
Platelets: 261 10*3/uL (ref 150–450)
RBC: 3.78 x10E6/uL (ref 3.77–5.28)
RDW: 11.7 % (ref 11.7–15.4)
WBC: 5 10*3/uL (ref 3.4–10.8)

## 2022-12-09 ENCOUNTER — Other Ambulatory Visit: Payer: Self-pay

## 2022-12-09 ENCOUNTER — Emergency Department (HOSPITAL_COMMUNITY): Payer: Medicare HMO

## 2022-12-09 ENCOUNTER — Inpatient Hospital Stay (HOSPITAL_COMMUNITY)
Admission: EM | Admit: 2022-12-09 | Discharge: 2022-12-11 | DRG: 690 | Disposition: A | Discharge status: Home / Self Care | Payer: Medicare HMO | Attending: Internal Medicine | Admitting: Internal Medicine

## 2022-12-09 ENCOUNTER — Ambulatory Visit
Admission: EM | Admit: 2022-12-09 | Discharge: 2022-12-09 | Disposition: A | Discharge status: Home / Self Care | Payer: Medicare HMO

## 2022-12-09 ENCOUNTER — Encounter (HOSPITAL_COMMUNITY): Payer: Self-pay

## 2022-12-09 DIAGNOSIS — E861 Hypovolemia: Secondary | ICD-10-CM | POA: Diagnosis not present

## 2022-12-09 DIAGNOSIS — Z66 Do not resuscitate: Secondary | ICD-10-CM | POA: Diagnosis not present

## 2022-12-09 DIAGNOSIS — Z90711 Acquired absence of uterus with remaining cervical stump: Secondary | ICD-10-CM

## 2022-12-09 DIAGNOSIS — N10 Acute tubulo-interstitial nephritis: Principal | ICD-10-CM | POA: Diagnosis present

## 2022-12-09 DIAGNOSIS — E039 Hypothyroidism, unspecified: Secondary | ICD-10-CM | POA: Diagnosis not present

## 2022-12-09 DIAGNOSIS — E871 Hypo-osmolality and hyponatremia: Secondary | ICD-10-CM | POA: Diagnosis not present

## 2022-12-09 DIAGNOSIS — E785 Hyperlipidemia, unspecified: Secondary | ICD-10-CM | POA: Diagnosis not present

## 2022-12-09 DIAGNOSIS — Z7982 Long term (current) use of aspirin: Secondary | ICD-10-CM | POA: Diagnosis not present

## 2022-12-09 DIAGNOSIS — H9190 Unspecified hearing loss, unspecified ear: Secondary | ICD-10-CM | POA: Diagnosis present

## 2022-12-09 DIAGNOSIS — Z96652 Presence of left artificial knee joint: Secondary | ICD-10-CM | POA: Diagnosis present

## 2022-12-09 DIAGNOSIS — R7881 Bacteremia: Secondary | ICD-10-CM | POA: Diagnosis not present

## 2022-12-09 DIAGNOSIS — N2889 Other specified disorders of kidney and ureter: Secondary | ICD-10-CM | POA: Diagnosis not present

## 2022-12-09 DIAGNOSIS — Z87891 Personal history of nicotine dependence: Secondary | ICD-10-CM | POA: Diagnosis not present

## 2022-12-09 DIAGNOSIS — I10 Essential (primary) hypertension: Secondary | ICD-10-CM | POA: Diagnosis not present

## 2022-12-09 DIAGNOSIS — B9620 Unspecified Escherichia coli [E. coli] as the cause of diseases classified elsewhere: Secondary | ICD-10-CM | POA: Diagnosis present

## 2022-12-09 DIAGNOSIS — R6889 Other general symptoms and signs: Secondary | ICD-10-CM

## 2022-12-09 DIAGNOSIS — Z888 Allergy status to other drugs, medicaments and biological substances status: Secondary | ICD-10-CM

## 2022-12-09 DIAGNOSIS — Z1152 Encounter for screening for COVID-19: Secondary | ICD-10-CM

## 2022-12-09 DIAGNOSIS — M5136 Other intervertebral disc degeneration, lumbar region: Secondary | ICD-10-CM | POA: Diagnosis present

## 2022-12-09 DIAGNOSIS — G3184 Mild cognitive impairment, so stated: Secondary | ICD-10-CM | POA: Diagnosis present

## 2022-12-09 DIAGNOSIS — Z800 Family history of malignant neoplasm of digestive organs: Secondary | ICD-10-CM | POA: Diagnosis not present

## 2022-12-09 DIAGNOSIS — Z79899 Other long term (current) drug therapy: Secondary | ICD-10-CM

## 2022-12-09 DIAGNOSIS — N12 Tubulo-interstitial nephritis, not specified as acute or chronic: Secondary | ICD-10-CM | POA: Diagnosis not present

## 2022-12-09 DIAGNOSIS — R10819 Abdominal tenderness, unspecified site: Secondary | ICD-10-CM | POA: Diagnosis not present

## 2022-12-09 DIAGNOSIS — M419 Scoliosis, unspecified: Secondary | ICD-10-CM | POA: Diagnosis present

## 2022-12-09 DIAGNOSIS — F988 Other specified behavioral and emotional disorders with onset usually occurring in childhood and adolescence: Secondary | ICD-10-CM | POA: Diagnosis present

## 2022-12-09 DIAGNOSIS — Z8673 Personal history of transient ischemic attack (TIA), and cerebral infarction without residual deficits: Secondary | ICD-10-CM

## 2022-12-09 DIAGNOSIS — I2510 Atherosclerotic heart disease of native coronary artery without angina pectoris: Secondary | ICD-10-CM | POA: Diagnosis present

## 2022-12-09 DIAGNOSIS — M1990 Unspecified osteoarthritis, unspecified site: Secondary | ICD-10-CM | POA: Diagnosis present

## 2022-12-09 DIAGNOSIS — Z91048 Other nonmedicinal substance allergy status: Secondary | ICD-10-CM

## 2022-12-09 DIAGNOSIS — D649 Anemia, unspecified: Secondary | ICD-10-CM | POA: Diagnosis not present

## 2022-12-09 DIAGNOSIS — Z91014 Allergy to mammalian meats: Secondary | ICD-10-CM

## 2022-12-09 DIAGNOSIS — Z9109 Other allergy status, other than to drugs and biological substances: Secondary | ICD-10-CM

## 2022-12-09 LAB — CBC
HCT: 36.7 % (ref 36.0–46.0)
Hemoglobin: 12.1 g/dL (ref 12.0–15.0)
MCH: 30.8 pg (ref 26.0–34.0)
MCHC: 33 g/dL (ref 30.0–36.0)
MCV: 93.4 fL (ref 80.0–100.0)
Platelets: 234 10*3/uL (ref 150–400)
RBC: 3.93 MIL/uL (ref 3.87–5.11)
RDW: 11.9 % (ref 11.5–15.5)
WBC: 11.1 10*3/uL — ABNORMAL HIGH (ref 4.0–10.5)
nRBC: 0 % (ref 0.0–0.2)

## 2022-12-09 LAB — URINALYSIS, ROUTINE W REFLEX MICROSCOPIC
Bilirubin Urine: NEGATIVE
Glucose, UA: NEGATIVE mg/dL
Ketones, ur: 20 mg/dL — AB
Nitrite: POSITIVE — AB
Protein, ur: 100 mg/dL — AB
Specific Gravity, Urine: 1.013 (ref 1.005–1.030)
WBC, UA: >50 WBC/hpf (ref 0–5)
pH: 5 (ref 5.0–8.0)

## 2022-12-09 LAB — LIPASE, BLOOD: Lipase: 27 U/L (ref 11–51)

## 2022-12-09 LAB — COMPREHENSIVE METABOLIC PANEL
ALT: 13 U/L (ref 0–44)
AST: 20 U/L (ref 15–41)
Albumin: 3.2 g/dL — ABNORMAL LOW (ref 3.5–5.0)
Alkaline Phosphatase: 90 U/L (ref 38–126)
Anion gap: 14 (ref 5–15)
BUN: 17 mg/dL (ref 8–23)
CO2: 21 mmol/L — ABNORMAL LOW (ref 22–32)
Calcium: 9.2 mg/dL (ref 8.9–10.3)
Chloride: 94 mmol/L — ABNORMAL LOW (ref 98–111)
Creatinine, Ser: 0.98 mg/dL (ref 0.44–1.00)
GFR, Estimated: 55 mL/min — ABNORMAL LOW (ref >60)
Glucose, Bld: 112 mg/dL — ABNORMAL HIGH (ref 70–99)
Potassium: 4.3 mmol/L (ref 3.5–5.1)
Sodium: 129 mmol/L — ABNORMAL LOW (ref 135–145)
Total Bilirubin: 0.9 mg/dL (ref 0.3–1.2)
Total Protein: 6.2 g/dL — ABNORMAL LOW (ref 6.5–8.1)

## 2022-12-09 LAB — LACTIC ACID, PLASMA
Lactic Acid, Venous: 1 mmol/L (ref 0.5–1.9)
Lactic Acid, Venous: 1.1 mmol/L (ref 0.5–1.9)

## 2022-12-09 MED ORDER — ACETAMINOPHEN 500 MG PO TABS
1000.0000 mg | ORAL_TABLET | Freq: Once | ORAL | Status: AC
  Administered 2022-12-09: 1000 mg via ORAL
  Filled 2022-12-09: qty 2

## 2022-12-09 MED ORDER — LACTATED RINGERS IV SOLN: INTRAVENOUS | Status: DC

## 2022-12-09 MED ORDER — PROCHLORPERAZINE EDISYLATE 10 MG/2ML IJ SOLN
5.0000 mg | Freq: Four times a day (QID) | INTRAMUSCULAR | Status: DC | PRN
  Administered 2022-12-10: 5 mg via INTRAVENOUS
  Filled 2022-12-09: qty 2

## 2022-12-09 MED ORDER — ENOXAPARIN SODIUM 40 MG/0.4ML IJ SOSY
40.0000 mg | PREFILLED_SYRINGE | Freq: Every day | INTRAMUSCULAR | Status: DC
  Administered 2022-12-10 – 2022-12-11 (×2): 40 mg via SUBCUTANEOUS
  Filled 2022-12-09 (×2): qty 0.4

## 2022-12-09 MED ORDER — LACTATED RINGERS IV BOLUS (SEPSIS)
1000.0000 mL | Freq: Once | INTRAVENOUS | Status: AC
  Administered 2022-12-09: 1000 mL via INTRAVENOUS

## 2022-12-09 MED ORDER — SODIUM CHLORIDE 0.9 % IV SOLN
2.0000 g | INTRAVENOUS | Status: DC
  Administered 2022-12-09 – 2022-12-10 (×2): 2 g via INTRAVENOUS
  Filled 2022-12-09 (×3): qty 20

## 2022-12-09 MED ORDER — ACETAMINOPHEN 325 MG PO TABS
650.0000 mg | ORAL_TABLET | Freq: Four times a day (QID) | ORAL | Status: DC | PRN
  Administered 2022-12-10 (×2): 650 mg via ORAL
  Filled 2022-12-09 (×2): qty 2

## 2022-12-09 MED ORDER — ASPIRIN 81 MG PO TBEC
81.0000 mg | DELAYED_RELEASE_TABLET | Freq: Every day | ORAL | Status: DC
  Administered 2022-12-10 – 2022-12-11 (×2): 81 mg via ORAL
  Filled 2022-12-09 (×2): qty 1

## 2022-12-09 MED ORDER — SENNOSIDES-DOCUSATE SODIUM 8.6-50 MG PO TABS: 1.0000 | ORAL_TABLET | Freq: Every evening | ORAL | Status: DC | PRN

## 2022-12-09 MED ORDER — SODIUM CHLORIDE 0.9% FLUSH
3.0000 mL | Freq: Two times a day (BID) | INTRAVENOUS | Status: DC
  Administered 2022-12-10 – 2022-12-11 (×3): 3 mL via INTRAVENOUS

## 2022-12-09 MED ORDER — IOHEXOL 350 MG/ML SOLN
75.0000 mL | Freq: Once | INTRAVENOUS | Status: AC | PRN
  Administered 2022-12-09: 75 mL via INTRAVENOUS

## 2022-12-09 MED ORDER — ACETAMINOPHEN 650 MG RE SUPP: 650.0000 mg | Freq: Four times a day (QID) | RECTAL | Status: DC | PRN
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
## 2022-12-09 NOTE — ED Provider Notes (Signed)
[place] [facility]ERGENC[facility] DEPARTMENT AT [facility] Provider Note   CSN: [id number] Arrival date & time: 12/09/22  1643     History  Chief Complaint  Patient presents with   Urinary Frequency    Cheryl Martin is a 89 y.o. female.  HPI 89 year old female history of ADD, hyperlipidemia, urinary incontinence, scoliosis, recent admission for small bowel obstruction requiring laparotomy and lysis of adhesions presenting for fever and chills.  She states for the last 2 days she has had fever, chills, fatigue.  She has had right flank pain as well.  No chest pain, difficulty breathing, cough, vomiting, diarrhea.  No abdominal pain.  No dysuria or hematuria, no history of UTI.  Denies history of recent antibiotic use.  She had fever to 103 at home, as well as rigors and chills.     Home Medications Prior to Admission medications   Medication Sig Start Date End Date Taking? Authorizing Provider  acetaminophen (TYLENOL) 500 MG tablet Take 1,000 mg by mouth every 6 (six) hours as needed for moderate pain or headache.    [provider]  aspirin EC (ASPIRIN LOW DOSE) 81 MG tablet Take 1 tablet (81 mg total) by mouth daily. Swallow whole. 10/18/22   Clovis Riley, PA-C  Bioflavonoid Products (VITAMIN C) CHEW Chew 1 tablet by mouth daily.    [provider]  BIOTIN PO Take 1 tablet by mouth daily.    [provider]  Cholecalciferol (VITAMIN D-3) 25 MCG (1000 UT) CAPS Take 1,000 Units by mouth daily with breakfast.    [provider]  cloNIDine (CATAPRES - DOSED IN MG/24 HR) 0.1 mg/24hr patch Place 1 patch (0.1 mg total) onto the skin once a week. 11/27/22   Arnetha Courser, MD  Cyanocobalamin (B-12) 1000 MCG SUBL Place 2,000 mcg under the tongue daily.    [provider]  ibuprofen (ADVIL) 400 MG tablet Take 1 tablet (400 mg total) by mouth every 8 (eight) hours as needed for fever, headache, mild pain or moderate pain. 11/21/22   Adam Phenix, PA-C  Multiple Vitamin (MULTIVITAMIN WITH MINERALS) TABS tablet Take 1 tablet by mouth daily.    [provider]  nitroGLYCERIN (NITROSTAT) 0.4 MG SL tablet Place 1 tablet (0.4 mg total) under the tongue every 5 (five) minutes as needed for chest pain. 10/22/18 11/28/22  Lars Masson, MD  ondansetron (ZOFRAN) 4 MG tablet Take 1 tablet (4 mg total) by mouth daily as needed for nausea or vomiting. 11/21/22 11/21/23  Arnetha Courser, MD  potassium chloride SA (KLOR-CON M) 20 MEQ tablet Take 1 tablet (20 mEq total) by mouth daily. 11/22/22   Arnetha Courser, MD  senna-docusate (SENOKOT-S) 8.6-50 MG tablet Place 1 tablet into feeding tube at bedtime as needed for moderate constipation. 11/21/22   Arnetha Courser, MD  Sodium Chloride, Hypertonic, (MURO 128 OP) Place 1 drop into both eyes 2 (two) times daily as needed (dry eyes).    [provider]      Allergies    Oxybutynin, Tape, Beef-derived products, Flexeril [cyclobenzaprine], Mold extract [trichophyton], and Pollen extract    Review of Systems   Review of Systems  Constitutional:  Positive for chills and fever.  Genitourinary:  Positive for flank pain.  All other systems reviewed and are negative.   Physical Exam Updated Vital Signs BP (!) 110/94   Pulse 84   Temp 97.6 F (36.4 C) (Oral)   Resp 20   Ht 5\' 3"  (1.6  m)   Wt 56 kg   SpO2 100%   BMI 21.87 kg/m  Physical Exam Vitals and nursing note reviewed.  Constitutional:      General: She is not in acute distress.    Appearance: She is well-developed.  HENT:     Head: Normocephalic and atraumatic.     Nose: Nose normal.     Mouth/Throat:     Mouth: Mucous membranes are moist.     Pharynx: Oropharynx is clear.  Eyes:     Extraocular Movements: Extraocular movements intact.     Conjunctiva/sclera: Conjunctivae normal.     Pupils: Pupils are equal, round, and reactive to light.  Cardiovascular:     Rate and Rhythm: Normal rate and regular rhythm.      Heart sounds: No murmur heard. Pulmonary:     Effort: Pulmonary effort is normal. No respiratory distress.     Breath sounds: Normal breath sounds.  Abdominal:     Palpations: Abdomen is soft.     Tenderness: There is abdominal tenderness. There is no right CVA tenderness, left CVA tenderness, guarding or rebound.     Comments: Right hemiabdominal tenderness  Musculoskeletal:        General: No swelling.     Cervical back: Neck supple.     Right lower leg: No edema.     Left lower leg: No edema.  Skin:    General: Skin is warm and dry.     Capillary Refill: Capillary refill takes less than 2 seconds.  Neurological:     General: No focal deficit present.     Mental Status: She is alert and oriented to person, place, and time. Mental status is at baseline.     Cranial Nerves: No cranial nerve deficit.     Sensory: No sensory deficit.     Motor: No weakness.  Psychiatric:        Mood and Affect: Mood normal.     ED Results / Procedures / Treatments   Labs (all labs ordered are listed, but only abnormal results are displayed) Labs Reviewed  COMPREHENSIVE METABOLIC PANEL - Abnormal; Notable for the following components:      Result Value   Sodium 129 (*)    Chloride 94 (*)    CO2 21 (*)    Glucose, Bld 112 (*)    Total Protein 6.2 (*)    Albumin 3.2 (*)    GFR, Estimated 55 (*)    All other components within normal limits  CBC - Abnormal; Notable for the following components:   WBC 11.1 (*)    All other components within normal limits  URINALYSIS, ROUTINE W REFLEX MICROSCOPIC - Abnormal; Notable for the following components:   APPearance CLOUDY (*)    Hgb urine dipstick MODERATE (*)    Ketones, ur 20 (*)    Protein, ur 100 (*)    Nitrite POSITIVE (*)    Leukocytes,Ua LARGE (*)    Bacteria, UA MANY (*)    All other components within normal limits  CULTURE, BLOOD (ROUTINE X 2)  CULTURE, BLOOD (ROUTINE X 2)  RESP PANEL BY RT-PCR (RSV, FLU A&B, COVID)  RVPGX2  URINE  CULTURE  LIPASE, BLOOD  LACTIC ACID, PLASMA  LACTIC ACID, PLASMA    EKG None  Radiology CT ABDOMEN PELVIS W CONTRAST  Result Date: 12/09/2022 CLINICAL DATA:  Fever, UTI, abdominal tenderness EXAM: CT ABDOMEN AND PELVIS WITH CONTRAST TECHNIQUE: Multidetector CT imaging of the abdomen and pelvis was performed using  the standard protocol following bolus administration of intravenous contrast. RADIATION DOSE REDUCTION: This exam was performed according to the departmental dose-optimization program which includes automated exposure control, adjustment of the mA and/or kV according to patient size and/or use of iterative reconstruction technique. CONTRAST:  75mL OMNIPAQUE IOHEXOL 350 MG/ML SOLN COMPARISON:  CT abdomen and pelvis 11/11/2022 FINDINGS: Lower chest: No acute abnormality. Hepatobiliary: Cholecystectomy. Prominent intra and extrahepatic bile ducts likely due to reservoir effect post cholecystectomy. Unremarkable liver. Pancreas: Unremarkable. Spleen: Unremarkable. Adrenals/Urinary Tract: Stable adrenal glands. Prominent right greater than left extrarenal pelves. Geographic areas of cortical hypoattenuation in both kidneys is suspicious for pyelonephritis. No urinary calculi. Unremarkable bladder. Stomach/Bowel: Normal caliber large and small bowel. No bowel wall thickening. Normal appendix. Moderate hiatal hernia. Vascular/Lymphatic: Aortic atherosclerosis. No enlarged abdominal or pelvic lymph nodes. Reproductive: Hysterectomy.  No adnexal mass. Other: Mesenteric edema.  No free intraperitoneal air. Musculoskeletal: No acute fracture. Thoracolumbar scoliosis and spondylosis. Bilateral femoral head AVN. IMPRESSION: 1. Geographic areas of cortical hypoattenuation in both kidneys is suspicious for pyelonephritis. Aortic Atherosclerosis (ICD10-I70.0). Electronically Signed   By: Minerva Fester M.D.   On: 12/09/2022 23:27    Procedures Procedures    Medications Ordered in ED Medications   lactated ringers infusion ( Intravenous New Bag/Given 12/09/22 2206)  cefTRIAXone (ROCEPHIN) 2 g in sodium chloride 0.9 % 100 mL IVPB (0 g Intravenous Stopped 12/09/22 2243)  acetaminophen (TYLENOL) tablet 1,000 mg (1,000 mg Oral Given 12/09/22 1742)  lactated ringers bolus 1,000 mL (0 mLs Intravenous Stopped 12/09/22 2326)  iohexol (OMNIPAQUE) 350 MG/ML injection 75 mL (75 mLs Intravenous Contrast Given 12/09/22 2259)    ED Course/ Medical Decision Making/ A&P                             Medical Decision Making Amount and/or Complexity of Data Reviewed Labs: ordered. Radiology: ordered.  Risk Prescription drug management. Decision regarding hospitalization.   89 year old female presenting for fever, rigors, flank pain.  Vital signs reviewed notable for initial fever.  On exam, patient has right-sided abdominal tenderness.  Reviewed her lab work, concerning for leukocytosis, non-anion gap metabolic acidosis, hyponatremia 129, creatinine 0.98.  Urinalysis is concerning for infection.  Lactic acid is normal.  Given degree for fever and rigors, I am concerned for complicated UTI and pyelonephritis.  Blood cultures drawn and patient was given Rocephin.  Given abdominal tenderness on exam, will obtain CT scan to evaluate for cholecystitis, appendicitis, renal abscess.  Clinically does not seem obstructed.  CT scan reviewed, no signs of stone, obstruction.  She does have evidence of bilateral pyelonephritis.  Given age and significant fever at home, I think she would benefit from hospitalization to follow blood cultures and urine culture and ensure she has clinical improvement.  Discussed patient with the hospitalist service and she was admitted for further management.        Final Clinical Impression(s) / ED Diagnoses Final diagnoses:  Pyelonephritis    Rx / DC Orders ED Discharge Orders     None         Fulton Reek, MD 12/09/22 2359    Melene Plan, DO 12/10/22 1508

## 2022-12-09 NOTE — ED Provider Triage Note (Signed)
Emergency Medicine Provider Triage Evaluation Note  Cheryl Martin , a 89 y.o. female  was evaluated in triage.  Patient complains of 3 days worth of fevers, chills and myalgias.  Was admitted and discharged at the beginning of May after a small bowel obstruction.  Managed operatively with general surgery.  She spoke with them 2 to 3 days ago and they said they did not believe the fever was related.  Tmax 103.1 at home.  Some cough but no production  Review of Systems  Positive:  Negative:   Physical Exam  BP 119/62 (BP Location: Right Arm)   Pulse 99   Temp (!) 101.9 F (38.8 C) (Oral)   Resp 20   SpO2 94%  Gen:   Awake, no distress   Resp:  Normal effort  MSK:   Moves extremities without difficulty  Other:  Nontender abdomen without CVA tenderness  Medical Decision Making  Medically screening exam initiated at 5:43 PM.  Appropriate orders placed.  DAVEENA KRELLER was informed that the remainder of the evaluation will be completed by another provider, this initial triage assessment does not replace that evaluation, and the importance of remaining in the ED until their evaluation is complete.     Saddie Benders, PA-C 12/09/22 1745

## 2022-12-09 NOTE — ED Triage Notes (Signed)
Pt c/o feeling cold, generalized shaking, reports fever at home.   Onset ~ 3 days ago   Reports hospitalization for 10 days dc on 5/2.

## 2022-12-09 NOTE — ED Notes (Signed)
Patient is being discharged from the Urgent Care and sent to the Emergency Department via self . Per NP, patient is in need of higher level of care due to possible sepsis. Patient is aware and verbalizes understanding of plan of care.  Vitals:   12/09/22 1525 12/09/22 1546  BP: (!) 132/47   Pulse:  88  Resp: 20   Temp: 98.3 F (36.8 C)   SpO2:  95%

## 2022-12-09 NOTE — Sepsis Progress Note (Signed)
Elink following for Sepsis Protocol 

## 2022-12-09 NOTE — ED Triage Notes (Signed)
Pt c/o fevers at home 101-103, shaking  x 3 days; denies vomiting, endorsing some nausea; denies urinary symptoms, endorses some sob; taking tylenol at home; endorsing R flank pain; last took tylenol around 1130 this am

## 2022-12-09 NOTE — ED Provider Notes (Signed)
Patient presents to urgent care for evaluation of fever, chills, and intermittent "generalized shaking" that started approximately 3 days ago.  Patient was recently hospitalized for small bowel obstruction and underwent exploratory laparotomy procedure on November 14, 2022.  She was discharged from the hospital on Nov 21, 2022 and has not had any recent antibiotics in the last 18 days since being discharged from the hospital.  Over the last 2 to 3 days, she has started complaining of right-sided low back discomfort as well.  Denies urinary symptoms, lower abdominal pain, constipation, blood/mucus in the stools, nausea, vomiting, diarrhea, and headache.  No chest pain, shortness of breath, heart palpitations, or lightheadedness.  No recent cough, nasal congestion, or dizziness.  She called her surgeons office 2 days ago at [facility] surgery and reported fever, however they deemed the fever to likely be unrelated to surgical procedure as it has been almost 4 weeks since the surgery.  Maximum temperature at home was 103.1 but this responded well to Tylenol use.  In triage, she is with visible generalized tremor.  Currently afebrile with hemodynamically stable vital signs, however I am concerned for significant infection given recent abdominal surgical procedure.  Patient would benefit from urgent blood work in the emergency department setting given age and risk for significant infection.  Cardiopulmonary exam is stable.  Vital signs are also hemodynamically stable and she is neurologically intact to her baseline, therefore may report to nearest emergency department via personal vehicle with her daughter, Okey Regal. Discussed risks of deferring ED visit, they express understanding and agreement with plan.    Carlisle Beers, [place] 12/09/22 1655

## 2022-12-09 NOTE — ED Triage Notes (Signed)
Due to patient condition rn notified provider. Provider at bedside to assess pt. Continuing to try to get vital signs pt shaking interfering with pulse ox/HR equipment.

## 2022-12-10 ENCOUNTER — Ambulatory Visit: Payer: Medicare HMO

## 2022-12-10 DIAGNOSIS — E871 Hypo-osmolality and hyponatremia: Secondary | ICD-10-CM

## 2022-12-10 DIAGNOSIS — I10 Essential (primary) hypertension: Secondary | ICD-10-CM | POA: Insufficient documentation

## 2022-12-10 DIAGNOSIS — G3184 Mild cognitive impairment, so stated: Secondary | ICD-10-CM

## 2022-12-10 DIAGNOSIS — Z8673 Personal history of transient ischemic attack (TIA), and cerebral infarction without residual deficits: Secondary | ICD-10-CM | POA: Diagnosis not present

## 2022-12-10 DIAGNOSIS — N12 Tubulo-interstitial nephritis, not specified as acute or chronic: Secondary | ICD-10-CM | POA: Diagnosis not present

## 2022-12-10 LAB — BASIC METABOLIC PANEL
Anion gap: 12 (ref 5–15)
BUN: 15 mg/dL (ref 8–23)
CO2: 21 mmol/L — ABNORMAL LOW (ref 22–32)
Calcium: 8.7 mg/dL — ABNORMAL LOW (ref 8.9–10.3)
Chloride: 98 mmol/L (ref 98–111)
Creatinine, Ser: 0.95 mg/dL (ref 0.44–1.00)
GFR, Estimated: 57 mL/min — ABNORMAL LOW (ref >60)
Glucose, Bld: 118 mg/dL — ABNORMAL HIGH (ref 70–99)
Potassium: 3.6 mmol/L (ref 3.5–5.1)
Sodium: 131 mmol/L — ABNORMAL LOW (ref 135–145)

## 2022-12-10 LAB — CBC
HCT: 31.8 % — ABNORMAL LOW (ref 36.0–46.0)
Hemoglobin: 10.8 g/dL — ABNORMAL LOW (ref 12.0–15.0)
MCH: 31 pg (ref 26.0–34.0)
MCHC: 34 g/dL (ref 30.0–36.0)
MCV: 91.4 fL (ref 80.0–100.0)
Platelets: 176 10*3/uL (ref 150–400)
RBC: 3.48 MIL/uL — ABNORMAL LOW (ref 3.87–5.11)
RDW: 12 % (ref 11.5–15.5)
WBC: 10.5 10*3/uL (ref 4.0–10.5)
nRBC: 0 % (ref 0.0–0.2)

## 2022-12-10 LAB — CULTURE, BLOOD (ROUTINE X 2)

## 2022-12-10 LAB — MAGNESIUM: Magnesium: 1.7 mg/dL (ref 1.7–2.4)

## 2022-12-10 MED ORDER — FENTANYL CITRATE PF 50 MCG/ML IJ SOSY
12.5000 ug | PREFILLED_SYRINGE | INTRAMUSCULAR | Status: DC | PRN
  Administered 2022-12-10: 25 ug via INTRAVENOUS
  Filled 2022-12-10: qty 1

## 2022-12-10 NOTE — Progress Notes (Signed)
  Progress Note   Patient: Cheryl Martin AOZ:[id number] DOB: 12/19/1930 DOA: 12/09/2022     1 DOS: the patient was seen and examined on 12/10/2022 at 10:01 AM      Brief hospital course: Cheryl Martin is an 89 y.o. F with HTN, SBO last month requiring ex-lap/LOA 4/25, hx TIA and low back pain s/p repeat laminectomies and foraminotomies who presented with chills and flank pain.   5/20: Admitted for pyelonephritis     Assessment and Plan: * Pyelonephritis - Continue Rocephin - Follow urine and blood cultures    Mild cognitive impairment Follows with Dr. Amalia Hailey, [facility] last June.  To me she has a little disorientation (to place), but is attentive to questions and oriented to situation. - Standard delirium precautions: blinds open and lights on during day, TV off, minimize interruptions at night, glasses/hearing aids, PT/OT, avoiding Beers list medications    Essential hypertension BP controlled - Hold clonidine  Hyponatremia Mild, asymptomatic.  Essentially at baseline low 130s. - Hold further IV fluids  History of transient ischemic attack (TIA) - Continue low-dose aspirin          Subjective: Patient is tired, little disoriented, but feeling relatively well.  Appetite okay.  No confusion.  Still some left-sided flank pain.     Physical Exam: BP (!) 110/51   Pulse 82   Temp 98.1 F (36.7 C) (Oral)   Resp 16   Ht 5\' 3"  (1.6 m)   Wt 56 kg   SpO2 98%   BMI 21.87 kg/m   Elderly adult female, sleeping but arouses, makes eye contact and is interactive RRR, no murmurs, no peripheral edema Respiratory rate normal, lungs clear without rales or wheezes Abdomen with some left-sided tenderness, with deep palpation, no other tenderness, no guarding, no rebound Attention normal, affect normal, judgment and insight appear at baseline, mild forgetfulness, face symmetric, generalized weakness but symmetric strength    Data Reviewed: Sodium up to 131, creatinine  stable CBC shows slight white count CT abdomen and pelvis consistent with pyelonephritis    Family Communication: Daughter by phone    Disposition: Status is: Inpatient Patient was admitted with pyelonephritis.  Given her age, generalized weakness, I recommend PT evaluation, continued IV antibiotics.  If you have culture data by tomorrow, hopefully home tomorrow with home health        Author: Alberteen Sam, MD 12/10/2022 10:45 AM  For on call review [email or web address].

## 2022-12-10 NOTE — ED Notes (Signed)
Patient vomited up green bile.

## 2022-12-10 NOTE — ED Notes (Signed)
Patient repositioned for comfort.

## 2022-12-10 NOTE — Hospital Course (Addendum)
Cheryl Martin is an 89 y.o. F with HTN, SBO last month requiring ex-lap/LOA 4/25, hx TIA and low back pain s/p repeat laminectomies and foraminotomies who presented with chills and flank pain.   5/20: Admitted for pyelonephritis

## 2022-12-10 NOTE — Assessment & Plan Note (Signed)
-   Continue Rocephin - Follow urine and blood cultures

## 2022-12-10 NOTE — Assessment & Plan Note (Signed)
Follows with Dr. Amalia Hailey, [facility] last June.  To me she has a little disorientation (to place), but is attentive to questions and oriented to situation. - Standard delirium precautions: blinds open and lights on during day, TV off, minimize interruptions at night, glasses/hearing aids, PT/OT, avoiding Beers list medications

## 2022-12-10 NOTE — Assessment & Plan Note (Signed)
Continue low dose aspirin 

## 2022-12-10 NOTE — Assessment & Plan Note (Signed)
Mild, asymptomatic.  Essentially at baseline low 130s. - Hold further IV fluids

## 2022-12-10 NOTE — ED Notes (Signed)
Cleaned patient of incontinent episode of urine, patient clean and dry, linens changed and BSC at bedside.
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
## 2022-12-10 NOTE — Assessment & Plan Note (Signed)
BP controlled - Hold clonidine

## 2022-12-10 NOTE — Evaluation (Signed)
Physical Therapy Evaluation Patient Details Name: Cheryl Martin MRN: [id number] DOB: August 29, 1930 Today's Date: 12/10/2022  History of Present Illness  89 yo female [facility] with shaking, fevers, R flank pain due to pyelonephritis. PMH includes ex lap 11/15/2022 with d/c on 5/2, L3-5 hemilaminetomy with foraminotomies 10/11/22, CAD, TIA, HLD, OA, anemia.  Clinical Impression   Pt presents with generalized weakness, impaired higher level balance but is steady with use of RW, and increased time to perform mobility tasks. Pt to benefit from acute PT to address deficits. Pt ambulated good hallway distance at supervision level, reports feeling weaker than baseline but overall pt doing well and progressing after x2 surgeries earlier this year. Pt would like to continue HHPT post-acutely. PT to progress mobility as tolerated, and will continue to follow acutely.         Recommendations for follow up therapy are one component of a multi-disciplinary discharge planning process, led by the attending physician.  Recommendations may be updated based on patient status, additional functional criteria and insurance authorization.  Follow Up Recommendations       Assistance Recommended at Discharge Intermittent Supervision/Assistance  Patient can return home with the following  Assist for transportation;Help with stairs or ramp for entrance;Assistance with cooking/housework    Equipment Recommendations None recommended by PT  Recommendations for Other Services       Functional Status Assessment Patient has had a recent decline in their functional status and demonstrates the ability to make significant improvements in function in a reasonable and predictable amount of time.     Precautions / Restrictions Precautions Precautions: Fall Restrictions Weight Bearing Restrictions: No      Mobility  Bed Mobility Overal bed mobility: Needs Assistance Bed Mobility: Supine to Sit, Sit to  Supine     Supine to sit: Supervision, HOB elevated Sit to supine: Supervision, HOB elevated   General bed mobility comments: use of bedrails, increased time    Transfers Overall transfer level: Needs assistance Equipment used: Rolling walker (2 wheels) Transfers: Sit to/from Stand Sit to Stand: Supervision           General transfer comment: for safety, slow to rise and steady. Stand x2, from EOB and toilet. use of grab bar in bathroom    Ambulation/Gait Ambulation/Gait assistance: Supervision Gait Distance (Feet): 200 Feet Assistive device: Rolling walker (2 wheels) Gait Pattern/deviations: Step-through pattern, Decreased stride length, Trunk flexed Gait velocity: decr     General Gait Details: cues for upright posture, slowed gait secondary to pain and weakness  Stairs            Wheelchair Mobility    Modified Rankin (Stroke Patients Only)       Balance Overall balance assessment: Needs assistance Sitting-balance support: No upper extremity supported, Feet supported Sitting balance-Leahy Scale: Good     Standing balance support: Bilateral upper extremity supported, During functional activity Standing balance-Leahy Scale: Fair                               Pertinent Vitals/Pain Pain Assessment Pain Assessment: Faces Faces Pain Scale: Hurts little more Pain Location: flank Pain Descriptors / Indicators: Discomfort, Guarding Pain Intervention(s): Limited activity within patient's tolerance, Monitored during session, Repositioned    Home Living Family/patient expects to be discharged to:: Private residence Living Arrangements: Children (daughter lives with her right now) Available Help at Discharge: Family Type of Home: House Home Access: Stairs to  enter Entrance Stairs-Rails: Left Entrance Stairs-Number of Steps: 3   Home Layout: One level Home Equipment: Cane - single Librarian, academic (2 wheels)      Prior Function Prior  Level of Function : Independent/Modified Independent             Mobility Comments: pt reports using RW PTA, was receiving HHPT       Hand Dominance   Dominant Hand: Right    Extremity/Trunk Assessment   Upper Extremity Assessment Upper Extremity Assessment: Defer to OT evaluation    Lower Extremity Assessment Lower Extremity Assessment: Generalized weakness    Cervical / Trunk Assessment Cervical / Trunk Assessment: Other exceptions Cervical / Trunk Exceptions: recent back surgery  Communication   Communication: No difficulties  Cognition Arousal/Alertness: Awake/alert Behavior During Therapy: WFL for tasks assessed/performed Overall Cognitive Status: Within Functional Limits for tasks assessed                                          General Comments      Exercises     Assessment/Plan    PT Assessment Patient needs continued PT services  PT Problem List Decreased strength;Decreased activity tolerance;Decreased balance;Decreased mobility;Decreased knowledge of use of DME;Decreased safety awareness;Decreased knowledge of precautions;Pain       PT Treatment Interventions DME instruction;Gait training;Stair training;Functional mobility training;Therapeutic activities;Therapeutic exercise;Balance training;Patient/family education    PT Goals (Current goals can be found in the Care Plan section)  Acute Rehab PT Goals Patient Stated Goal: get stronger PT Goal Formulation: With patient Time For Goal Achievement: 12/24/22 Potential to Achieve Goals: Good    Frequency Min 3X/week     Co-evaluation               AM-PAC PT "6 Clicks" Mobility  Outcome Measure Help needed turning from your back to your side while in a flat bed without using bedrails?: None Help needed moving from lying on your back to sitting on the side of a flat bed without using bedrails?: A Little Help needed moving to and from a bed to a chair (including a  wheelchair)?: A Little Help needed standing up from a chair using your arms (e.g., wheelchair or bedside chair)?: A Little Help needed to walk in hospital room?: A Little Help needed climbing 3-5 steps with a railing? : A Little 6 Click Score: 19    End of Session   Activity Tolerance: Patient tolerated treatment well Patient left: in bed;with call bell/phone within reach;with family/visitor present Nurse Communication: Mobility status;Other (comment) (pt appropriate for stand pivot level to [facility] without staff assist) PT Visit Diagnosis: Other abnormalities of gait and mobility (R26.89);Muscle weakness (generalized) (M62.81);Difficulty in walking, not elsewhere classified (R26.2)    Time: 1610-9604 PT Time Calculation (min) (ACUTE ONLY): 24 min   Charges:   PT Evaluation $PT Eval Low Complexity: 1 Low PT Treatments $Therapeutic Activity: 8-22 mins       Marye Round, PT DPT Acute Rehabilitation Services Secure Chat Preferred  Office [phone]   Eiman Maret Sheliah Plane 12/10/2022, 4:15 PM

## 2022-12-11 DIAGNOSIS — N12 Tubulo-interstitial nephritis, not specified as acute or chronic: Secondary | ICD-10-CM

## 2022-12-11 LAB — BLOOD CULTURE ID PANEL (REFLEXED) - BCID2

## 2022-12-11 LAB — BASIC METABOLIC PANEL
Anion gap: 8 (ref 5–15)
BUN: 9 mg/dL (ref 8–23)
CO2: 25 mmol/L (ref 22–32)
Calcium: 8.6 mg/dL — ABNORMAL LOW (ref 8.9–10.3)
Chloride: 98 mmol/L (ref 98–111)
Creatinine, Ser: 0.72 mg/dL (ref 0.44–1.00)
GFR, Estimated: >60 mL/min (ref >60)
Glucose, Bld: 94 mg/dL (ref 70–99)
Potassium: 3.6 mmol/L (ref 3.5–5.1)
Sodium: 131 mmol/L — ABNORMAL LOW (ref 135–145)

## 2022-12-11 LAB — CBC
HCT: 31.5 % — ABNORMAL LOW (ref 36.0–46.0)
Hemoglobin: 10.6 g/dL — ABNORMAL LOW (ref 12.0–15.0)
MCH: 30 pg (ref 26.0–34.0)
MCHC: 33.7 g/dL (ref 30.0–36.0)
MCV: 89.2 fL (ref 80.0–100.0)
Platelets: 194 10*3/uL (ref 150–400)
RBC: 3.53 MIL/uL — ABNORMAL LOW (ref 3.87–5.11)
RDW: 12 % (ref 11.5–15.5)
WBC: 5.3 10*3/uL (ref 4.0–10.5)
nRBC: 0 % (ref 0.0–0.2)

## 2022-12-11 LAB — CULTURE, BLOOD (ROUTINE X 2): Culture: NO GROWTH

## 2022-12-11 LAB — URINE CULTURE

## 2022-12-11 MED ORDER — LEVOFLOXACIN 500 MG PO TABS
500.0000 mg | ORAL_TABLET | Freq: Every day | ORAL | Status: DC
  Administered 2022-12-11: 500 mg via ORAL
  Filled 2022-12-11: qty 1

## 2022-12-11 MED ORDER — LEVOFLOXACIN 500 MG PO TABS: 500.0000 mg | ORAL_TABLET | Freq: Every day | ORAL | 0 refills | Status: DC

## 2022-12-11 NOTE — Progress Notes (Addendum)
[facility] PROGRESS NOTE   Cheryl Martin ZOX:[id number] DOB: [birth year] DOA: 12/09/2022  PCP: [name], PA  Brief History/Interval Summary: 89 y.o. female with medical history significant for hypertension, history of TIA, chronic back pain with radiculopathy s/p L3-4 and L4-5 hemilaminectomy and foraminotomies, and SBO last month status post exploratory laparotomy with lysis of adhesions on 11/14/2022, presenting to the emergency department with shaking chills and right flank pain.  Diagnosed as having acute pyelonephritis.  Patient was hospitalized for further management.  Consultants: None  Procedures: None    Subjective/Interval History: Patient mentions that she is feeling stronger than yesterday.  Denies any nausea vomiting.  No back pain.  No abdominal pain.  No dysuria or hematuria.    Assessment/Plan:  Acute pyelonephritis Culture data is pending.  Patient seems to have improved.  Noted to be on ceftriaxone.  Follow-up on cultures.  Mobilize.  Hyponatremia Sodium levels improved but not back to normal yet.  Normocytic anemia Drop in hemoglobin is dilutional.  No evidence of overt bleeding.  History of TIA/cognitive impairment Continue with aspirin.  Recent small bowel obstruction status post laparotomy Seems to be stable.  No nausea or vomiting.  Abdomen is benign.  DVT Prophylaxis: Lovenox Code Status: DNR Family Communication: No family at bedside Disposition Plan: Mobilize.  Hopefully return home when improved  Status is: Inpatient Remains inpatient appropriate because: Acute pyelonephritis     Medications: Scheduled:  aspirin EC  81 mg Oral Daily   enoxaparin (LOVENOX) injection  40 mg Subcutaneous Daily   sodium chloride flush  3 mL Intravenous Q12H   Continuous:  cefTRIAXone (ROCEPHIN)  IV Stopped (12/10/22 2213)   WJX:BJYNWGNFAOZHY **OR** acetaminophen, fentaNYL (SUBLIMAZE) injection, prochlorperazine,  senna-docusate  Antibiotics: Anti-infectives (From admission, onward)    Start     Dose/Rate Route Frequency Ordered Stop   12/09/22 2200  cefTRIAXone (ROCEPHIN) 2 g in sodium chloride 0.9 % 100 mL IVPB        2 g 200 mL/hr over 30 Minutes Intravenous Every 24 hours 12/09/22 2151 12/16/22 2159       Objective:  Vital Signs  Vitals:   12/10/22 2056 12/11/22 0438 12/11/22 0800 12/11/22 0804  BP: (!) 108/55 139/61 (!) 144/60 (!) 149/65  Pulse: 93 80 80 82  Resp: 18 18 18 18   Temp: 99.2 F (37.3 C) 97.8 F (36.6 C) 98.4 F (36.9 C) 98.9 F (37.2 C)  TempSrc: Oral Oral Oral Oral  SpO2: 99% 99% 98% 97%  Weight:      Height:        Intake/Output Summary (Last 24 hours) at 12/11/2022 0903 Last data filed at 12/11/2022 8657 Gross per 24 hour  Intake 1097.27 ml  Output --  Net 1097.27 ml   Filed Weights   12/09/22 2245  Weight: 56 kg    General appearance: Awake alert.  In no distress Resp: Clear to auscultation bilaterally.  Normal effort Cardio: S1-S2 is normal regular.  No S3-S4.  No rubs murmurs or bruit GI: Abdomen is soft.  Nontender nondistended.  Bowel sounds are present normal.  No masses organomegaly Extremities: No edema.  Full range of motion of lower extremities. Neurologic: Alert and oriented x3.  No focal neurological deficits.    Lab Results:  Data Reviewed: I have personally reviewed following labs and reports of the imaging studies  CBC: Recent Labs  Lab 12/06/22 0921 12/09/22 1749 12/10/22 0212 12/11/22 0448  WBC 5.0 11.1* 10.5 5.3  NEUTROABS 3.1  --   --   --  HGB 11.8 12.1 10.8* 10.6*  HCT 34.7 36.7 31.8* 31.5*  MCV 92 93.4 91.4 89.2  PLT 261 234 176 194    Basic Metabolic Panel: Recent Labs  Lab 12/06/22 0921 12/09/22 1749 12/10/22 0212 12/11/22 0448  NA 136 129* 131* 131*  K 4.8 4.3 3.6 3.6  CL 100 94* 98 98  CO2 24 21* 21* 25  GLUCOSE 94 112* 118* 94  BUN 11 17 15 9   CREATININE 0.76 0.98 0.95 0.72  CALCIUM 9.6 9.2 8.7*  8.6*  MG  --   --  1.7  --     GFR: Estimated Creatinine Clearance: 39.4 mL/min (by C-G formula based on SCr of 0.72 mg/dL).  Liver Function Tests: Recent Labs  Lab 12/06/22 0921 12/09/22 1749  AST 16 20  ALT 9 13  ALKPHOS 95 90  BILITOT 0.3 0.9  PROT 5.4* 6.2*  ALBUMIN 3.8 3.2*    Recent Labs  Lab 12/09/22 1749  LIPASE 27     Recent Results (from the past 240 hour(s))  Blood culture (routine x 2)     Status: None (Preliminary result)   Collection Time: 12/09/22  5:49 PM   Specimen: BLOOD  Result Value Ref Range Status   Specimen Description BLOOD RIGHT ANTECUBITAL  Final   Special Requests   Final    BOTTLES DRAWN AEROBIC AND ANAEROBIC Blood Culture adequate volume   Culture   Final    NO GROWTH 2 DAYS Performed at [facility], [address], [place], [place] [address]    Report Status PENDING  Incomplete  Blood culture (routine x 2)     Status: None (Preliminary result)   Collection Time: 12/09/22  9:51 PM   Specimen: BLOOD  Result Value Ref Range Status   Specimen Description BLOOD SITE NOT SPECIFIED  Final   Special Requests   Final    BOTTLES DRAWN AEROBIC AND ANAEROBIC Blood Culture results may not be optimal due to an inadequate volume of blood received in culture bottles   Culture   Final    NO GROWTH 2 DAYS Performed at [facility], [address], [place], [place] [address]    Report Status PENDING  Incomplete      Radiology Studies: CT ABDOMEN PELVIS W CONTRAST  Result Date: 12/09/2022 CLINICAL DATA:  Fever, UTI, abdominal tenderness EXAM: CT ABDOMEN AND PELVIS WITH CONTRAST TECHNIQUE: Multidetector CT imaging of the abdomen and pelvis was performed using the standard protocol following bolus administration of intravenous contrast. RADIATION DOSE REDUCTION: This exam was performed according to the departmental dose-optimization program which includes automated exposure control, adjustment of the mA and/or kV according to patient size  and/or use of iterative reconstruction technique. CONTRAST:  75mL OMNIPAQUE IOHEXOL 350 MG/ML SOLN COMPARISON:  CT abdomen and pelvis 11/11/2022 FINDINGS: Lower chest: No acute abnormality. Hepatobiliary: Cholecystectomy. Prominent intra and extrahepatic bile ducts likely due to reservoir effect post cholecystectomy. Unremarkable liver. Pancreas: Unremarkable. Spleen: Unremarkable. Adrenals/Urinary Tract: Stable adrenal glands. Prominent right greater than left extrarenal pelves. Geographic areas of cortical hypoattenuation in both kidneys is suspicious for pyelonephritis. No urinary calculi. Unremarkable bladder. Stomach/Bowel: Normal caliber large and small bowel. No bowel wall thickening. Normal appendix. Moderate hiatal hernia. Vascular/Lymphatic: Aortic atherosclerosis. No enlarged abdominal or pelvic lymph nodes. Reproductive: Hysterectomy.  No adnexal mass. Other: Mesenteric edema.  No free intraperitoneal air. Musculoskeletal: No acute fracture. Thoracolumbar scoliosis and spondylosis. Bilateral femoral head AVN. IMPRESSION: 1. Geographic areas of cortical hypoattenuation in both  kidneys is suspicious for pyelonephritis. Aortic Atherosclerosis (ICD10-I70.0). Electronically Signed   By: Minerva Fester M.D.   On: 12/09/2022 23:27       LOS: 2 days   Darcee Dekker Rito Ehrlich  [facility] Pager on [email or web address]  12/11/2022, 9:03 AM

## 2022-12-11 NOTE — Discharge Summary (Signed)
[facility]  Physician Discharge Summary   Patient ID: Cheryl Martin MRN: [id number] DOB/AGE: May 19, 1931 89 y.o.  Admit date: 12/09/2022 Discharge date: 12/11/2022    PCP: [name], PA  DISCHARGE DIAGNOSES:  Acute pyelonephritis E. coli bacteremia   History of transient ischemic attack (TIA)   Hyponatremia   Essential hypertension   Mild cognitive impairment   RECOMMENDATIONS FOR OUTPATIENT FOLLOW UP: Patient instructed to follow-up with her primary care provider within 1 week   Home Health: None Equipment/Devices: None  CODE STATUS: DNR  DISCHARGE CONDITION: fair  Diet recommendation: As before  INITIAL HISTORY:  89 y.o. female with medical history significant for hypertension, history of TIA, chronic back pain with radiculopathy s/p L3-4 and L4-5 hemilaminectomy and foraminotomies, and SBO last month status post exploratory laparotomy with lysis of adhesions on 11/14/2022, presenting to the emergency department with shaking chills and right flank pain.  Diagnosed as having acute pyelonephritis.  Patient was hospitalized for further management.    HOSPITAL COURSE:   Acute pyelonephritis with E. coli/E. coli bacteremia Patient improved after she was started on ceftriaxone and given IV fluids.  She was able to tolerate oral antibiotics.  She did not have any nausea vomiting.  She wanted to go home yesterday.  Culture results were not back yet.  She was discharged on Levaquin.   After discharge the culture results came back.  Her blood culture positive for E. coli.  Urine culture positive for E. coli.  Sensitivities reviewed.  Discussed with ID.  She was discharged on Levaquin which should cover this organism and duration of treatment should also be enough to clear out the bacteremia. Patient was called today to discuss the results of her cultures.  She is feeling well without any fever.  Able to tolerate orally.  She was told that if she were to develop  fever or nausea or vomiting or flank pain then she should seek attention immediately.     Hyponatremia Sodium levels improved but not back to normal yet.   Normocytic anemia Drop in hemoglobin is dilutional.  No evidence of overt bleeding.   History of TIA/cognitive impairment Continue with aspirin.   Recent small bowel obstruction status post laparotomy Seems to be stable.  No nausea or vomiting.  Abdomen is benign.   Patient stable.  She expressed a wish to go home.  She was discharged home in stable condition.   PERTINENT LABS:  The results of significant diagnostics from this hospitalization (including imaging, microbiology, ancillary and laboratory) are listed below for reference.    Microbiology: Recent Results (from the past 240 hour(s))  Urine Culture     Status: Abnormal   Collection Time: 12/09/22  4:43 PM   Specimen: Urine, Clean Catch  Result Value Ref Range Status   Specimen Description URINE, CLEAN CATCH  Final   Special Requests   Final    NONE Performed at [facility], [address], [place], [place] [address]    Culture >=100,000 COLONIES/mL ESCHERICHIA COLI (A)  Final   Report Status 12/12/2022 FINAL  Final   Organism ID, Bacteria ESCHERICHIA COLI (A)  Final      Susceptibility   Escherichia coli - MIC*    AMPICILLIN >=32 RESISTANT Resistant     CEFAZOLIN >=64 RESISTANT Resistant     CEFEPIME <=0.12 SENSITIVE Sensitive     CIPROFLOXACIN <=0.25 SENSITIVE Sensitive     GENTAMICIN <=1 SENSITIVE Sensitive     IMIPENEM <=0.25 SENSITIVE Sensitive  NITROFURANTOIN <=16 SENSITIVE Sensitive     TRIMETH/SULFA >=320 RESISTANT Resistant     AMPICILLIN/SULBACTAM RESISTANT Resistant     PIP/TAZO <=4 SENSITIVE Sensitive     * >=100,000 COLONIES/mL ESCHERICHIA COLI  Blood culture (routine x 2)     Status: None (Preliminary result)   Collection Time: 12/09/22  5:49 PM   Specimen: BLOOD  Result Value Ref Range Status   Specimen Description BLOOD RIGHT  ANTECUBITAL  Final   Special Requests   Final    BOTTLES DRAWN AEROBIC AND ANAEROBIC Blood Culture adequate volume   Culture   Final    NO GROWTH 3 DAYS Performed at [facility], [address] [place], [place] [address]    Report Status PENDING  Incomplete  Blood culture (routine x 2)     Status: Abnormal (Preliminary result)   Collection Time: 12/09/22  9:51 PM   Specimen: BLOOD  Result Value Ref Range Status   Specimen Description BLOOD SITE NOT SPECIFIED  Final   Special Requests   Final    BOTTLES DRAWN AEROBIC AND ANAEROBIC Blood Culture results may not be optimal due to an inadequate volume of blood received in culture bottles   Culture  Setup Time   Final    GRAM NEGATIVE RODS AEROBIC BOTTLE ONLY PATIENT DISCHARGED OR EXPIRED    Culture (A)  Final    ESCHERICHIA COLI SUSCEPTIBILITIES TO FOLLOW Performed at [facility], [address] [place], [place] [address]    Report Status PENDING  Incomplete  Blood Culture ID Panel (Reflexed)     Status: Abnormal   Collection Time: 12/09/22  9:51 PM  Result Value Ref Range Status   Enterococcus faecalis NOT DETECTED NOT DETECTED Final   Enterococcus Faecium NOT DETECTED NOT DETECTED Final   Listeria monocytogenes NOT DETECTED NOT DETECTED Final   Staphylococcus species NOT DETECTED NOT DETECTED Final   Staphylococcus aureus (BCID) NOT DETECTED NOT DETECTED Final   Staphylococcus epidermidis NOT DETECTED NOT DETECTED Final   Staphylococcus lugdunensis NOT DETECTED NOT DETECTED Final   Streptococcus species NOT DETECTED NOT DETECTED Final   Streptococcus agalactiae NOT DETECTED NOT DETECTED Final   Streptococcus pneumoniae NOT DETECTED NOT DETECTED Final   Streptococcus pyogenes NOT DETECTED NOT DETECTED Final   A.calcoaceticus-baumannii NOT DETECTED NOT DETECTED Final   Bacteroides fragilis NOT DETECTED NOT DETECTED Final   Enterobacterales DETECTED (A) NOT DETECTED Final    Comment: Enterobacterales represent a  large order of gram negative bacteria, not a single organism.   Enterobacter cloacae complex NOT DETECTED NOT DETECTED Final   Escherichia coli DETECTED (A) NOT DETECTED Final   Klebsiella aerogenes NOT DETECTED NOT DETECTED Final   Klebsiella oxytoca NOT DETECTED NOT DETECTED Final   Klebsiella pneumoniae NOT DETECTED NOT DETECTED Final   Proteus species NOT DETECTED NOT DETECTED Final   Salmonella species NOT DETECTED NOT DETECTED Final   Serratia marcescens NOT DETECTED NOT DETECTED Final   Haemophilus influenzae NOT DETECTED NOT DETECTED Final   Neisseria meningitidis NOT DETECTED NOT DETECTED Final   Pseudomonas aeruginosa NOT DETECTED NOT DETECTED Final   Stenotrophomonas maltophilia NOT DETECTED NOT DETECTED Final   Candida albicans NOT DETECTED NOT DETECTED Final   Candida auris NOT DETECTED NOT DETECTED Final   Candida glabrata NOT DETECTED NOT DETECTED Final   Candida krusei NOT DETECTED NOT DETECTED Final   Candida parapsilosis NOT DETECTED NOT DETECTED Final   Candida tropicalis NOT DETECTED NOT DETECTED Final   Cryptococcus neoformans/gattii  NOT DETECTED NOT DETECTED Final   CTX-M ESBL NOT DETECTED NOT DETECTED Final   Carbapenem resistance IMP NOT DETECTED NOT DETECTED Final   Carbapenem resistance KPC NOT DETECTED NOT DETECTED Final   Carbapenem resistance NDM NOT DETECTED NOT DETECTED Final   Carbapenem resist OXA 48 LIKE NOT DETECTED NOT DETECTED Final   Carbapenem resistance VIM NOT DETECTED NOT DETECTED Final    Comment: Performed at [facility], [address] [place], [place] [address]     Labs:   Basic Metabolic Panel: Recent Labs  Lab 12/06/22 0921 12/09/22 1749 12/10/22 0212 12/11/22 0448  NA 136 129* 131* 131*  K 4.8 4.3 3.6 3.6  CL 100 94* 98 98  CO2 24 21* 21* 25  GLUCOSE 94 112* 118* 94  BUN 11 17 15 9   CREATININE 0.76 0.98 0.95 0.72  CALCIUM 9.6 9.2 8.7* 8.6*  MG  --   --  1.7  --    Liver Function Tests: Recent Labs  Lab  12/06/22 0921 12/09/22 1749  AST 16 20  ALT 9 13  ALKPHOS 95 90  BILITOT 0.3 0.9  PROT 5.4* 6.2*  ALBUMIN 3.8 3.2*   Recent Labs  Lab 12/09/22 1749  LIPASE 27    CBC: Recent Labs  Lab 12/06/22 0921 12/09/22 1749 12/10/22 0212 12/11/22 0448  WBC 5.0 11.1* 10.5 5.3  NEUTROABS 3.1  --   --   --   HGB 11.8 12.1 10.8* 10.6*  HCT 34.7 36.7 31.8* 31.5*  MCV 92 93.4 91.4 89.2  PLT 261 234 176 194     IMAGING STUDIES CT ABDOMEN PELVIS W CONTRAST  Result Date: 12/09/2022 CLINICAL DATA:  Fever, UTI, abdominal tenderness EXAM: CT ABDOMEN AND PELVIS WITH CONTRAST TECHNIQUE: Multidetector CT imaging of the abdomen and pelvis was performed using the standard protocol following bolus administration of intravenous contrast. RADIATION DOSE REDUCTION: This exam was performed according to the departmental dose-optimization program which includes automated exposure control, adjustment of the mA and/or kV according to patient size and/or use of iterative reconstruction technique. CONTRAST:  75mL OMNIPAQUE IOHEXOL 350 MG/ML SOLN COMPARISON:  CT abdomen and pelvis 11/11/2022 FINDINGS: Lower chest: No acute abnormality. Hepatobiliary: Cholecystectomy. Prominent intra and extrahepatic bile ducts likely due to reservoir effect post cholecystectomy. Unremarkable liver. Pancreas: Unremarkable. Spleen: Unremarkable. Adrenals/Urinary Tract: Stable adrenal glands. Prominent right greater than left extrarenal pelves. Geographic areas of cortical hypoattenuation in both kidneys is suspicious for pyelonephritis. No urinary calculi. Unremarkable bladder. Stomach/Bowel: Normal caliber large and small bowel. No bowel wall thickening. Normal appendix. Moderate hiatal hernia. Vascular/Lymphatic: Aortic atherosclerosis. No enlarged abdominal or pelvic lymph nodes. Reproductive: Hysterectomy.  No adnexal mass. Other: Mesenteric edema.  No free intraperitoneal air. Musculoskeletal: No acute fracture. Thoracolumbar scoliosis  and spondylosis. Bilateral femoral head AVN. IMPRESSION: 1. Geographic areas of cortical hypoattenuation in both kidneys is suspicious for pyelonephritis. Aortic Atherosclerosis (ICD10-I70.0). Electronically Signed   By: Minerva Fester M.D.   On: 12/09/2022 23:27   DG Abd Portable 1V  Result Date: 11/19/2022 CLINICAL DATA:  Vomiting. EXAM: PORTABLE ABDOMEN - 1 VIEW COMPARISON:  11/14/2022 FINDINGS: New midline skin staples are noted consistent with recent surgery. Mild air distended bowel mainly in the small bowel. Findings could be due to a postoperative ileus or recurrent small-bowel obstruction. Recommend correlation with bowel sounds. No obvious free air. IMPRESSION: Mild air distended bowel mainly in the small bowel could be due to a postoperative ileus or recurrent small-bowel obstruction. Electronically Signed  By: Rudie Meyer M.D.   On: 11/19/2022 18:33   CUP PACEART REMOTE DEVICE CHECK  Result Date: 11/18/2022 ILR summary report received. Battery status OK. Normal device function. No new symptom, tachy, brady, or pause episodes. No new AF episodes.  AF burden is 0% of the time.   Monthly summary reports and ROV/PRN Hassell Halim, RN, CCDS, [employer]  DG Abd Portable 1V  Result Date: 11/14/2022 CLINICAL DATA:  Small-bowel obstruction. EXAM: PORTABLE ABDOMEN - 1 VIEW COMPARISON:  Radiographs 11/13/2022 and 11/12/2022.  CT 11/11/2022. FINDINGS: 0824 hours. Enteric tube is unchanged, overlying the mid lumbar spine and likely within the mid stomach. Previously administered enteric contrast remains within moderately dilated small bowel in the pelvis. There is a persistent dilated, air-filled loop of small bowel in the left upper quadrant. No significant colonic contrast identified. There is stool in the right colon. Surgical clips are present in the right upper quadrant. No evidence of extravasated enteric contrast or pneumoperitoneum. Moderate convex left thoracolumbar scoliosis again  noted. IMPRESSION: No significant change in findings of small-bowel obstruction. Enteric contrast remains within moderately dilated small bowel loops in the pelvis. No significant colonic contrast identified. Electronically Signed   By: Carey Bullocks M.D.   On: 11/14/2022 08:39   DG Abd Portable 1V-Small Bowel Obstruction Protocol-initial, 8 hr delay  Result Date: 11/13/2022 CLINICAL DATA:  Delayed images after contrast administration. EXAM: PORTABLE ABDOMEN - 1 VIEW COMPARISON:  Previous examination done earlier today FINDINGS: There is dilation of small-bowel loops measuring up to 4 cm in diameter. Oral contrast is still in the lumen of dilated small bowel loops. Oral contrast has not reached the right colon. Tip of NG tube is seen in the stomach. Surgical clips are seen in right upper quadrant. Rotoscoliosis is seen thoracolumbar junction with convexity to the left. IMPRESSION: Oral contrast remains within dilated small bowel loops suggesting partial small bowel obstruction. Electronically Signed   By: Ernie Avena M.D.   On: 11/13/2022 19:26   DG Abd 1 View  Result Date: 11/13/2022 CLINICAL DATA:  Small-bowel obstruction EXAM: ABDOMEN - 1 VIEW COMPARISON:  Portable exam 0645 hours compared to 11/12/2022 FINDINGS: Surgical clips RIGHT upper quadrant consistent with cholecystectomy. Tip of nasogastric tube projects over proximal stomach. Dilated air-filled small bowel loops in the LEFT upper quadrant and LEFT mid abdomen consistent with small bowel obstruction. No bowel wall thickening. Small amount of scattered colonic gas and stool. Osseous demineralization with levoconvex thoracolumbar scoliosis. Atherosclerotic calcification aorta. IMPRESSION: Persistent small bowel obstruction. Electronically Signed   By: Ulyses Southward M.D.   On: 11/13/2022 08:45   DG Abd Portable 1V-Small Bowel Obstruction Protocol-initial, 8 hr delay  Result Date: 11/12/2022 CLINICAL DATA:  Evaluate NG placement EXAM:  PORTABLE ABDOMEN - 1 VIEW COMPARISON:  Earlier today. FINDINGS: The enteric tube tip and side port are below the level of the GE junction. The tip of the tube is in the expected location of the gastric fundus. Paucity bowel gas limits assessment for degree of bowel dilatation. Surgical clips are again seen within the right hemiabdomen. Scoliosis deformity in lumbar degenerative disc disease. IMPRESSION: Tip and side port of enteric tube are below the level of the GE junction. Tip of the NG tube is in the gastric fundus. Electronically Signed   By: Signa Kell M.D.   On: 11/12/2022 16:21    DISCHARGE EXAMINATION: See progress note from earlier today  DISPOSITION: Home  Discharge Instructions     Call  MD for:  difficulty breathing, headache or visual disturbances   Complete by: As directed    Call MD for:  extreme fatigue   Complete by: As directed    Call MD for:  persistant dizziness or light-headedness   Complete by: As directed    Call MD for:  persistant nausea and vomiting   Complete by: As directed    Call MD for:  severe uncontrolled pain   Complete by: As directed    Call MD for:  temperature >100.4   Complete by: As directed    Diet general   Complete by: As directed    Discharge instructions   Complete by: As directed    Please take your medications as prescribed.  The results of the urine cultures are not available at the time of your discharge.  We will call you with the results if a change in antibiotic is needed.  Please be sure to follow-up with your primary care provider within 1 week.  Seek attention if your symptoms worsen.  You were cared for by a hospitalist during your hospital stay. If you have any questions about your discharge medications or the care you received while you were in the hospital after you are discharged, you can call the unit and asked to speak with the hospitalist on call if the hospitalist that took care of you is not available. Once you are  discharged, your primary care physician will handle any further medical issues. Please note that NO REFILLS for any discharge medications will be authorized once you are discharged, as it is imperative that you return to your primary care physician (or establish a relationship with a primary care physician if you do not have one) for your aftercare needs so that they can reassess your need for medications and monitor your lab values. If you do not have a primary care physician, you can call [phone] for a physician referral.   Increase activity slowly   Complete by: As directed           Allergies as of 12/11/2022       Reactions   Oxybutynin Other (See Comments)   Cannot tolerate at higher doses, caused dehydration    Tape Other (See Comments)   Skin will tear with medical tape, but can tolerate paper tape only   Beef-derived Products    Personal preference   Flexeril [cyclobenzaprine] Other (See Comments)   Caused excessive lethargy   Mold Extract [trichophyton] Other (See Comments)   Runny nose (with dust, also)   Pollen Extract Other (See Comments)   Runny nose        Medication List     STOP taking these medications    cloNIDine 0.1 mg/24hr patch Commonly known as: CATAPRES - Dosed in mg/24 hr   ibuprofen 400 MG tablet Commonly known as: ADVIL   potassium chloride SA 20 MEQ tablet Commonly known as: KLOR-CON M   senna-docusate 8.6-50 MG tablet Commonly known as: Senokot-S       TAKE these medications    acetaminophen 500 MG tablet Commonly known as: TYLENOL Take 1,000 mg by mouth every 6 (six) hours as needed for moderate pain or headache.   Aspirin Low Dose 81 MG tablet Generic drug: aspirin EC Take 1 tablet (81 mg total) by mouth daily. Swallow whole.   B-12 1000 MCG Subl Place 2,000 mcg under the tongue daily.   BIOTIN PO Take 1 tablet by mouth daily.   levofloxacin 500 MG  tablet Commonly known as: LEVAQUIN Take 1 tablet (500 mg total) by  mouth daily.   multivitamin with minerals Tabs tablet Take 1 tablet by mouth daily.   MURO 128 OP Place 1 drop into both eyes 2 (two) times daily as needed (dry eyes).   nitroGLYCERIN 0.4 MG SL tablet Commonly known as: NITROSTAT Place 1 tablet (0.4 mg total) under the tongue every 5 (five) minutes as needed for chest pain.   ondansetron 4 MG tablet Commonly known as: Zofran Take 1 tablet (4 mg total) by mouth daily as needed for nausea or vomiting.   Vitamin C Chew Chew 1 tablet by mouth daily.   Vitamin D-3 25 MCG (1000 UT) Caps Take 1,000 Units by mouth daily with breakfast.          Follow-up Information     [name], PA. Schedule an appointment as soon as possible for a visit in 1 week(s).   Specialty: Family Medicine Why: post hospitalization follow up Contact information: [address] [place] [address] [phone]                 TOTAL DISCHARGE TIME: 35 minutes  Caelyn Route Rito Ehrlich  [facility] Pager on [email or web address]  12/12/2022, 10:45 AM

## 2022-12-12 ENCOUNTER — Telehealth: Payer: Self-pay

## 2022-12-12 LAB — URINE CULTURE: Culture: >=100000 — AB

## 2022-12-12 NOTE — Transitions of Care (Post Inpatient/ED Visit) (Signed)
12/12/2022  Name: Cheryl Martin MRN: [id number] DOB: [birth year]  Today's TOC FU Call Status: Today's TOC FU Call Status:: Successful TOC FU Call Competed TOC FU Call Complete Date: 12/12/22  Transition Care Management Follow-up Telephone Call Date of Discharge: 12/11/22 Discharge Facility: [facility]) Type of Discharge: Inpatient Admission Primary Inpatient Discharge Diagnosis:: nephritis How have you been since you were released from the hospital?: Same Any questions or concerns?: No  Items Reviewed: Did you receive and understand the discharge instructions provided?: Yes Medications obtained,verified, and reconciled?: Yes (Medications Reviewed) Any new allergies since your discharge?: No Dietary orders reviewed?: Yes Do you have support at home?: Yes People in Home: child(ren), adult  Medications Reviewed Today: Medications Reviewed Today     Reviewed by Karena Addison, LPN (Licensed Practical Nurse) on 12/12/22 at 1113  Med List Status: <None>   Medication Order Taking? Sig Documenting Provider Last Dose Status Informant  acetaminophen (TYLENOL) 500 MG tablet [id number] Yes Take 1,000 mg by mouth every 6 (six) hours as needed for moderate pain or headache. [provider] Taking Active Self, Pharmacy Records  aspirin EC (ASPIRIN LOW DOSE) 81 MG tablet [id number] Yes Take 1 tablet (81 mg total) by mouth daily. Swallow whole. Clovis Riley, PA-C Taking Active Self, Pharmacy Records  Bioflavonoid Products (VITAMIN C) CHEW [id number] Yes Chew 1 tablet by mouth daily. [provider] Taking Active Self, Pharmacy Records  BIOTIN PO [id number] Yes Take 1 tablet by mouth daily. [provider] Taking Active Self, Pharmacy Records  Cholecalciferol (VITAMIN D-3) 25 MCG (1000 UT) CAPS [id number] Yes Take 1,000 Units by mouth daily with breakfast. [provider] Taking Active Self, Pharmacy Records  Cyanocobalamin (B-12) 1000 MCG SUBL  [id number] Yes Place 2,000 mcg under the tongue daily. [provider] Taking Active Self, Pharmacy Records  levofloxacin (LEVAQUIN) 500 MG tablet [id number] Yes Take 1 tablet (500 mg total) by mouth daily. Osvaldo Shipper, MD Taking Active   Multiple Vitamin (MULTIVITAMIN WITH MINERALS) TABS tablet [id number] Yes Take 1 tablet by mouth daily. [provider] Taking Active Self, Pharmacy Records  nitroGLYCERIN (NITROSTAT) 0.4 MG SL tablet [id number]  Place 1 tablet (0.4 mg total) under the tongue every 5 (five) minutes as needed for chest pain. Lars Masson, MD  Expired 12/10/22 2359 Self, Pharmacy Records           Med Note Sueanne Margarita   Tue Aug 06, 2022 11:21 AM)    ondansetron (ZOFRAN) 4 MG tablet [id number] Yes Take 1 tablet (4 mg total) by mouth daily as needed for nausea or vomiting. Arnetha Courser, MD Taking Active Self, Pharmacy Records  Sodium Chloride, Hypertonic, (MURO 128 OP) [id number] Yes Place 1 drop into both eyes 2 (two) times daily as needed (dry eyes). [provider] Taking Active Self, Pharmacy Records           Med Note Ernest Mallick, [place]   Wed Aug 18, 2019  9:26 AM)              Home Care and Equipment/Supplies: Were Home Health Services Ordered?: NA Any new equipment or medical supplies ordered?: NA Were you able to get the equipment/medical supplies?: No Do you have any questions related to the use of the equipment/supplies?: No  Functional Questionnaire: Do you need assistance with bathing/showering or dressing?: No Do you need assistance with meal preparation?: No Do you need assistance with eating?: No Do you have difficulty maintaining continence: No Do you need  assistance with getting out of bed/getting out of a chair/moving?: No Do you have difficulty managing or taking your medications?: No  Follow up appointments reviewed: PCP Follow-up appointment confirmed?: Yes Date of PCP follow-up appointment?: 12/17/22 Follow-up  Provider: [facility] Follow-up appointment confirmed?: NA Do you need transportation to your follow-up appointment?: No Do you understand care options if your condition(s) worsen?: Yes-patient verbalized understanding    SIGNATURE Karena Addison, LPN [facility] Nurse Health Advisor Direct Dial [phone]

## 2022-12-13 LAB — CULTURE, BLOOD (ROUTINE X 2)

## 2022-12-14 LAB — CULTURE, BLOOD (ROUTINE X 2): Special Requests: ADEQUATE

## 2022-12-15 ENCOUNTER — Encounter (HOSPITAL_COMMUNITY): Payer: Self-pay

## 2022-12-15 ENCOUNTER — Emergency Department (HOSPITAL_COMMUNITY)
Admission: EM | Admit: 2022-12-15 | Discharge: 2022-12-15 | Disposition: A | Discharge status: Home / Self Care | Payer: Medicare HMO | Attending: Emergency Medicine | Admitting: Emergency Medicine

## 2022-12-15 DIAGNOSIS — I2510 Atherosclerotic heart disease of native coronary artery without angina pectoris: Secondary | ICD-10-CM | POA: Diagnosis not present

## 2022-12-15 DIAGNOSIS — M79604 Pain in right leg: Secondary | ICD-10-CM | POA: Insufficient documentation

## 2022-12-15 DIAGNOSIS — Z7982 Long term (current) use of aspirin: Secondary | ICD-10-CM | POA: Insufficient documentation

## 2022-12-15 DIAGNOSIS — M79605 Pain in left leg: Secondary | ICD-10-CM | POA: Insufficient documentation

## 2022-12-15 DIAGNOSIS — E039 Hypothyroidism, unspecified: Secondary | ICD-10-CM | POA: Diagnosis not present

## 2022-12-15 LAB — COMPREHENSIVE METABOLIC PANEL
ALT: 16 U/L (ref 0–44)
AST: 19 U/L (ref 15–41)
Albumin: 2.9 g/dL — ABNORMAL LOW (ref 3.5–5.0)
Alkaline Phosphatase: 71 U/L (ref 38–126)
Anion gap: 9 (ref 5–15)
BUN: 12 mg/dL (ref 8–23)
CO2: 22 mmol/L (ref 22–32)
Calcium: 8.6 mg/dL — ABNORMAL LOW (ref 8.9–10.3)
Chloride: 101 mmol/L (ref 98–111)
Creatinine, Ser: 0.75 mg/dL (ref 0.44–1.00)
GFR, Estimated: >60 mL/min (ref >60)
Glucose, Bld: 99 mg/dL (ref 70–99)
Potassium: 3.9 mmol/L (ref 3.5–5.1)
Sodium: 132 mmol/L — ABNORMAL LOW (ref 135–145)
Total Bilirubin: 0.6 mg/dL (ref 0.3–1.2)
Total Protein: 5.4 g/dL — ABNORMAL LOW (ref 6.5–8.1)

## 2022-12-15 LAB — CBC WITH DIFFERENTIAL/PLATELET
Abs Immature Granulocytes: 0.07 10*3/uL (ref 0.00–0.07)
Basophils Absolute: 0 10*3/uL (ref 0.0–0.1)
Basophils Relative: 1 %
Eosinophils Absolute: 0.1 10*3/uL (ref 0.0–0.5)
Eosinophils Relative: 2 %
HCT: 33.7 % — ABNORMAL LOW (ref 36.0–46.0)
Hemoglobin: 11.2 g/dL — ABNORMAL LOW (ref 12.0–15.0)
Immature Granulocytes: 1 %
Lymphocytes Relative: 20 %
Lymphs Abs: 1.3 10*3/uL (ref 0.7–4.0)
MCH: 30.3 pg (ref 26.0–34.0)
MCHC: 33.2 g/dL (ref 30.0–36.0)
MCV: 91.1 fL (ref 80.0–100.0)
Monocytes Absolute: 0.7 10*3/uL (ref 0.1–1.0)
Monocytes Relative: 10 %
Neutro Abs: 4.4 10*3/uL (ref 1.7–7.7)
Neutrophils Relative %: 66 %
Platelets: 320 10*3/uL (ref 150–400)
RBC: 3.7 MIL/uL — ABNORMAL LOW (ref 3.87–5.11)
RDW: 11.9 % (ref 11.5–15.5)
WBC: 6.6 10*3/uL (ref 4.0–10.5)
nRBC: 0 % (ref 0.0–0.2)

## 2022-12-15 NOTE — Discharge Instructions (Signed)
The leg pain could be from the Levaquin.  Stop it for now.  Your blood work is reassuring.

## 2022-12-15 NOTE — ED Provider Notes (Signed)
[place] [facility]MENT AT [facility] Provider Note   CSN: [id number] Arrival date & time: 12/15/22  1525     History  Chief Complaint  Patient presents with   Leg Pain    Cheryl Martin is a 89 y.o. female.   Leg Pain Patient presents pain in bilateral lower legs.  Goes from knees to her rear end.  Is on Levaquin for recent UTI and bacteremia.  Finishing up course either later today or tomorrow.  No fevers.  No chills.  Is feeling somewhat weak overall that is not improved much since leaving the hospital.  No abdominal pain.  No trauma.    Past Medical History:  Diagnosis Date   ADD (attention deficit disorder)    Allergy    Anemia    vit b12 and vit d deficiencies   Arthritis    osteoarthritis   Back spasm    Bronchitis    Complication of anesthesia    slow to wake up after colonoscopy   Coronary artery disease    DDD (degenerative disc disease), lumbar    Edema    Family history of adverse reaction to anesthesia    Daughter also has PONV   Hearing loss    History of hiatal hernia    hx of   Hyperlipidemia    Hypothyroidism    hx of as child and during pregnancy   Neuromuscular disorder (HCC)    pt unsure of what this is.   Nocturia    Pneumonia 02/2020   frequent bouts of bronchitis/pneumonia. has zithromax to take at start of it; bronchiectasis 01/12/19 CT.   PONV (postoperative nausea and vomiting)    Stroke (HCC) 12/2018   tia. no residual   Thyroid disease     Home Medications Prior to Admission medications   Medication Sig Start Date End Date Taking? Authorizing Provider  acetaminophen (TYLENOL) 500 MG tablet Take 1,000 mg by mouth every 6 (six) hours as needed for moderate pain or headache.    [provider]  aspirin EC (ASPIRIN LOW DOSE) 81 MG tablet Take 1 tablet (81 mg total) by mouth daily. Swallow whole. 10/18/22   Clovis Riley, PA-C  Bioflavonoid Products (VITAMIN C) CHEW Chew 1 tablet by mouth daily.     [provider]  BIOTIN PO Take 1 tablet by mouth daily.    [provider]  Cholecalciferol (VITAMIN D-3) 25 MCG (1000 UT) CAPS Take 1,000 Units by mouth daily with breakfast.    [provider]  Cyanocobalamin (B-12) 1000 MCG SUBL Place 2,000 mcg under the tongue daily.    [provider]  levofloxacin (LEVAQUIN) 500 MG tablet Take 1 tablet (500 mg total) by mouth daily. 12/12/22   Osvaldo Shipper, MD  Multiple Vitamin (MULTIVITAMIN WITH MINERALS) TABS tablet Take 1 tablet by mouth daily.    [provider]  nitroGLYCERIN (NITROSTAT) 0.4 MG SL tablet Place 1 tablet (0.4 mg total) under the tongue every 5 (five) minutes as needed for chest pain. 10/22/18 12/10/22  Lars Masson, MD  ondansetron (ZOFRAN) 4 MG tablet Take 1 tablet (4 mg total) by mouth daily as needed for nausea or vomiting. 11/21/22 11/21/23  Arnetha Courser, MD  Sodium Chloride, Hypertonic, (MURO 128 OP) Place 1 drop into both eyes 2 (two) times daily as needed (dry eyes).    [provider]      Allergies    Oxybutynin, Tape, Beef-derived products, Flexeril [cyclobenzaprine], Mold extract [trichophyton],  and Pollen extract    Review of Systems   Review of Systems  Physical Exam Updated Vital Signs BP (!) 146/64 (BP Location: Right Arm)   Pulse 84   Temp 98.3 F (36.8 C)   Resp 18   Ht 5\' 3"  (1.6 m)   Wt 55.8 kg   SpO2 97%   BMI 21.79 kg/m  Physical Exam Vitals and nursing note reviewed.  HENT:     Head: Normocephalic.  Cardiovascular:     Rate and Rhythm: Regular rhythm.  Pulmonary:     Effort: Pulmonary effort is normal.  Abdominal:     Tenderness: There is no abdominal tenderness.  Musculoskeletal:        General: Tenderness present.     Comments: Good straight leg raise bilaterally.  Tenderness over distal tendons from hamstrings and knee.  Skin:    General: Skin is warm.  Neurological:     Mental Status: She is alert.     ED Results / Procedures  / Treatments   Labs (all labs ordered are listed, but only abnormal results are displayed) Labs Reviewed  CBC WITH DIFFERENTIAL/PLATELET - Abnormal; Notable for the following components:      Result Value   RBC 3.70 (*)    Hemoglobin 11.2 (*)    HCT 33.7 (*)    All other components within normal limits  COMPREHENSIVE METABOLIC PANEL - Abnormal; Notable for the following components:   Sodium 132 (*)    Calcium 8.6 (*)    Total Protein 5.4 (*)    Albumin 2.9 (*)    All other components within normal limits    EKG None  Radiology No results found.  Procedures Procedures    Medications Ordered in ED Medications - No data to display  ED Course/ Medical Decision Making/ A&P                             Medical Decision Making Amount and/or Complexity of Data Reviewed Labs: ordered.   Patient with pain in lower extremities.  Some pain to palpation.  On Levaquin.  Blood work reassuring.  Recently admission for bacteremia and UTI.  No fevers and doubt worsening infection.  Potentially tendinopathy or myopathy from the Levaquin.  Will stop the Levaquin.  Lab work reassuring.  Will discharge.        Final Clinical Impression(s) / ED Diagnoses Final diagnoses:  Pain in both lower extremities    Rx / DC Orders ED Discharge Orders     None         Benjiman Core, MD 12/15/22 2259

## 2022-12-15 NOTE — ED Triage Notes (Addendum)
Pt c/o pain from knees to bottom. Pt states pain got worse when walking around this morning.  Pt is concerned for a reaction to levaquin.  Family adds that since pt was d/c, she has not gotten any better.  No relief with tylenol at home, last dose at 1300

## 2022-12-16 ENCOUNTER — Ambulatory Visit: Payer: Medicare HMO

## 2022-12-17 ENCOUNTER — Encounter: Payer: Self-pay | Admitting: Family Medicine

## 2022-12-17 ENCOUNTER — Ambulatory Visit (INDEPENDENT_AMBULATORY_CARE_PROVIDER_SITE_OTHER): Payer: Medicare HMO | Admitting: Family Medicine

## 2022-12-17 VITALS — BP 112/73 | HR 76 | Resp 18 | Ht 63.0 in | Wt 122.0 lb

## 2022-12-17 DIAGNOSIS — N12 Tubulo-interstitial nephritis, not specified as acute or chronic: Secondary | ICD-10-CM

## 2022-12-17 DIAGNOSIS — Z09 Encounter for follow-up examination after completed treatment for conditions other than malignant neoplasm: Secondary | ICD-10-CM

## 2022-12-17 NOTE — Patient Instructions (Signed)
I will continue to look into estrogen and the relation to being able to symptoms of UTIs, but I can confirm that hormone replacement therapy is generally not recommended for those over 87 years old because of the risk of side effects that increases with age.  Topical estrogen cream can be helpful for vulvovaginal dryness, and since it is not a systemic medication has very low risk of side effects.  I have included some information for you to read about and consider.

## 2022-12-17 NOTE — Progress Notes (Signed)
   Established Patient Office Visit  Subjective   Patient ID: Cheryl Martin, female    DOB: [birth year]  Age: 89 y.o. MRN: [id number]  Chief Complaint  Patient presents with   Hospitalization Follow-up    HPI Cheryl Martin is a 89 y.o. female presenting today for follow up of hospital stay.  Hospitalized from 12/09/2022 through 12/11/2022 for acute pyelonephritis, treated with antibiotics and IV fluid.  Discharged home on Levaquin.  Returned to ED on 12/15/2022 with bilateral lower leg pain.  Lab work was reassuring, suspected tendon neuropathy or myopathy from Levaquin and discontinued.  Leg pain has improved since discontinuing Levaquin, still a 4/10 pain but patient states it is bearable.  She would like to discuss with her urologist whether or not it is recommended and safe to continue with her Botox injections.  Likewise, she has questions about hormone replacement therapy as she had several providers in the hospital tell her that she is at risk of not feeling UTI symptoms until it progresses to pyelonephritis due to having low estrogen as she ages.  ROS Negative unless otherwise noted in HPI   Objective:     BP 112/73 (BP Location: Left Arm, Patient Position: Sitting, Cuff Size: Normal)   Pulse 76   Resp 18   Ht 5\' 3"  (1.6 m)   Wt 122 lb (55.3 kg)   SpO2 97%   BMI 21.61 kg/m   Physical Exam Constitutional:      General: She is not in acute distress.    Appearance: Normal appearance.  HENT:     Head: Normocephalic and atraumatic.  Cardiovascular:     Rate and Rhythm: Normal rate and regular rhythm.     Heart sounds: No murmur heard.    No friction rub. No gallop.  Pulmonary:     Effort: Pulmonary effort is normal. No respiratory distress.     Breath sounds: No wheezing, rhonchi or rales.  Skin:    General: Skin is warm and dry.  Neurological:     Mental Status: She is alert and oriented to person, place, and time. Mental status is at baseline.     Cranial Nerves:  No cranial nerve deficit.  Psychiatric:        Mood and Affect: Mood normal.        Behavior: Behavior normal.        Thought Content: Thought content normal.     Assessment & Plan:  Hospital discharge follow-up  Pyelonephritis  Pain will likely continue to improve as time goes on after discontinuing Levaquin.  We also discussed that HRT with systemic estrogen is generally not recommended for those above 62-60 years old due to an increased risk of stroke, heart disease, breast cancer, side effects.  A decrease in estrogen can contribute to vulvovaginal atrophy, which may make UTI symptoms less obvious.  We discussed that an option for this would be to use topical estrogen cream to help the atrophy with a lower side effect profile.  Provided printout with further information, patient will consider this option.  She is also going to get in touch with her urologist to discuss further management options and continuing Botox injections or not.  Return if symptoms worsen or fail to improve.   I spent 35 minutes on the day of the encounter to include pre-visit record review of hospital notes and workup and face-to-face time with the patient.  [name], PA

## 2022-12-18 ENCOUNTER — Telehealth: Payer: Self-pay | Admitting: *Deleted

## 2022-12-18 NOTE — Telephone Encounter (Signed)
Home Health Nurse [name] with [facility] health calling to report pt having left hip, buttock pain 7-8 scale worse when walks and stands.  WAnting to know if provider was agreeable to pt trying lidocaine patch which she already has at home and provider was agreeable and the verbal was given. Also she is requesting below.    Skilled nursing 2 wk 3 1 wk 1  2 PRN PT Eval.    Verbal given for this and will follow with a fax for provider to sign and fax back.  If you have any questions you can reach [place] at [phone].

## 2022-12-19 ENCOUNTER — Ambulatory Visit (INDEPENDENT_AMBULATORY_CARE_PROVIDER_SITE_OTHER): Payer: Medicare HMO

## 2022-12-19 DIAGNOSIS — G459 Transient cerebral ischemic attack, unspecified: Secondary | ICD-10-CM | POA: Diagnosis not present

## 2022-12-20 NOTE — Progress Notes (Signed)
[facility] Summary Report / Loop Recorder 

## 2022-12-23 LAB — CUP PACEART REMOTE DEVICE CHECK
Date Time Interrogation Session: 20240531230636
Implantable Pulse Generator Implant Date: 20231115

## 2022-12-24 ENCOUNTER — Telehealth: Payer: Self-pay | Admitting: Family Medicine

## 2022-12-24 NOTE — Telephone Encounter (Signed)
Patient called and states she had a small intestine bowel obstruction and she also had a UTI and wants to push Botox out 1 month. Appointment has been cancelled. Patient will do urine same day. She states it is hard for her to come ahead of time. She will be here 45 minutes early.

## 2023-01-01 ENCOUNTER — Encounter: Payer: Self-pay | Admitting: Neurology

## 2023-01-01 ENCOUNTER — Ambulatory Visit: Payer: Medicare HMO | Admitting: Neurology

## 2023-01-01 VITALS — BP 102/54 | HR 69 | Ht 63.0 in | Wt 121.0 lb

## 2023-01-01 DIAGNOSIS — F09 Unspecified mental disorder due to known physiological condition: Secondary | ICD-10-CM

## 2023-01-01 DIAGNOSIS — G301 Alzheimer's disease with late onset: Secondary | ICD-10-CM

## 2023-01-01 MED ORDER — DONEPEZIL HCL 5 MG PO TABS
5.0000 mg | ORAL_TABLET | Freq: Every day | ORAL | 11 refills | Status: DC
Start: 2023-01-01 — End: 2024-02-09

## 2023-01-01 NOTE — Patient Instructions (Signed)
Start Aricept 5 mg nightly, side effect include vivid dreams, dizziness and diarrhea Continue your other medications Continue with exercise at least 20 minutes a day 5 days a week Continue follow-up PCP Return in 6 months or sooner if worse    There are well-accepted and sensible ways to reduce risk for Alzheimers disease and other degenerative brain disorders .  Exercise Daily Walk A daily 20 minute walk should be part of your routine. Disease related apathy can be a significant roadblock to exercise and the only way to overcome this is to make it a daily routine and perhaps have a reward at the end (something your loved one loves to eat or drink perhaps) or a personal trainer coming to the home can also be very useful. Most importantly, the patient is much more likely to exercise if the caregiver / spouse does it with him/her. In general a structured, repetitive schedule is best.  General Health: Any diseases which effect your body will effect your brain such as a pneumonia, urinary infection, blood clot, heart attack or stroke. Keep contact with your primary care doctor for regular follow ups.  Sleep. A good nights sleep is healthy for the brain. Seven hours is recommended. If you have insomnia or poor sleep habits we can give you some instructions. If you have sleep apnea wear your mask.  Diet: Eating a heart healthy diet is also a good idea; fish and poultry instead of red meat, nuts (mostly non-peanuts), vegetables, fruits, olive oil or canola oil (instead of butter), minimal salt (use other spices to flavor foods), whole grain rice, bread, cereal and pasta and wine in moderation.Research is now showing that the MIND diet, which is a combination of The Mediterranean diet and the DASH diet, is beneficial for cognitive processing and longevity. Information about this diet can be found in The MIND Diet, a book by Alonna Minium, MS, RDN, and online at  [employer]  Finances, Power of [address] and Advance Directives: You should consider putting legal safeguards in place with regard to financial and medical decision making. While the spouse always has power of attorney for medical and financial issues in the absence of any form, you should consider what you want in case the spouse / caregiver is no longer around or capable of making decisions.
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
## 2023-01-02 ENCOUNTER — Ambulatory Visit (INDEPENDENT_AMBULATORY_CARE_PROVIDER_SITE_OTHER): Payer: Medicare HMO | Admitting: Family Medicine

## 2023-01-02 ENCOUNTER — Encounter: Payer: Self-pay | Admitting: Family Medicine

## 2023-01-02 VITALS — BP 127/72 | HR 74 | Resp 18 | Ht 63.0 in | Wt 120.0 lb

## 2023-01-02 DIAGNOSIS — R300 Dysuria: Secondary | ICD-10-CM | POA: Diagnosis not present

## 2023-01-02 DIAGNOSIS — R3121 Asymptomatic microscopic hematuria: Secondary | ICD-10-CM

## 2023-01-02 LAB — POCT URINALYSIS DIPSTICK
Bilirubin, UA: NEGATIVE
Glucose, UA: NEGATIVE
Ketones, UA: NEGATIVE
Leukocytes, UA: NEGATIVE
Nitrite, UA: NEGATIVE
Protein, UA: NEGATIVE
Spec Grav, UA: 1.02 (ref 1.010–1.025)
Urobilinogen, UA: 0.2 E.U./dL
pH, UA: 7 (ref 5.0–8.0)

## 2023-01-02 NOTE — Patient Instructions (Signed)
Urology should call you within the next few weeks to get scheduled and figure out what to do moving forward. After that, we can discuss if topical estrogen cream will be a good option in combination with their plan.

## 2023-01-02 NOTE — Progress Notes (Signed)
Acute Office Visit  Subjective:     Patient ID: Cheryl Martin, female    DOB: 1931/02/03, 89 y.o.   MRN: [id number]  Chief Complaint  Patient presents with   Dysuria         HPI Patient is in today for possible UTI.  Last week, she had an episode of dysuria which has since resolved. Patient does have a history of pyelonephritis for which she was hospitalized from 12/09/2022 through 12/11/2022.  At last office visit for hospital discharge follow-up, discussed possibility of using topical estrogen cream to improve vulvovaginal atrophy, patient and daughter brought printed educational materials at home to consider.  We also discussed for her to get in touch with her urologist to discuss further management options and whether or not she should continue Botox injections.  She has not been into the urologist yet, she only goes to the urologist in [place] for her Botox injections periodically.  She also is complaining of 7/10 sciatica pain.  She typically does around 30 repetitions of each exercise with her physical therapist, but she feels that it may be beneficial to start with 10-15 repetitions and work her way up to 30.  She plans on discussing with her PT when she returns for the next session tomorrow.  ROS Negative unless otherwise noted in HPI    Objective:    BP 127/72 (BP Location: Left Arm, Patient Position: Sitting, Cuff Size: Normal)   Pulse 74   Resp 18   Ht 5\' 3"  (1.6 m)   Wt 120 lb (54.4 kg)   SpO2 100%   BMI 21.26 kg/m   Physical Exam Constitutional:      General: She is not in acute distress.    Appearance: Normal appearance.  HENT:     Head: Normocephalic and atraumatic.  Pulmonary:     Effort: Pulmonary effort is normal. No respiratory distress.  Abdominal:     General: There is no distension.     Tenderness: There is no right CVA tenderness or left CVA tenderness.  Musculoskeletal:     Cervical back: Normal range of motion.  Neurological:     General:  No focal deficit present.     Mental Status: She is alert and oriented to person, place, and time. Mental status is at baseline.  Psychiatric:        Mood and Affect: Mood normal.        Thought Content: Thought content normal.        Judgment: Judgment normal.    Results for orders placed or performed in visit on 01/02/23  POCT Urinalysis Dipstick  Result Value Ref Range   Color, UA Yellow    Clarity, UA Cloudy    Glucose, UA Negative Negative   Bilirubin, UA Negative    Ketones, UA Negative    Spec Grav, UA 1.020 1.010 - 1.025   Blood, UA Trace-intact    pH, UA 7.0 5.0 - 8.0   Protein, UA Negative Negative   Urobilinogen, UA 0.2 0.2 or 1.0 E.U./dL   Nitrite, UA Negative    Leukocytes, UA Negative Negative   Appearance     Odor       Assessment & Plan:  Dysuria -     POCT urinalysis dipstick  Asymptomatic microscopic hematuria -     Ambulatory referral to Urology  Patient requested that she see a urologist to discuss recurrent UTIs and hematuria on urinalysis in [place].  She states that she only sees  urology in [place] for her Botox injections.    Continue working with PT [name] for sciatica/back pain.  Return if symptoms worsen or fail to improve.  [name], PA

## 2023-01-07 NOTE — Progress Notes (Signed)
[facility] Summary Report / Loop Recorder 

## 2023-01-13 DIAGNOSIS — N10 Acute tubulo-interstitial nephritis: Secondary | ICD-10-CM | POA: Diagnosis not present

## 2023-01-13 DIAGNOSIS — B9620 Unspecified Escherichia coli [E. coli] as the cause of diseases classified elsewhere: Secondary | ICD-10-CM | POA: Diagnosis not present

## 2023-01-14 ENCOUNTER — Ambulatory Visit (INDEPENDENT_AMBULATORY_CARE_PROVIDER_SITE_OTHER): Payer: Medicare HMO

## 2023-01-14 VITALS — Ht 63.0 in | Wt 120.0 lb

## 2023-01-14 DIAGNOSIS — Z0000 Encounter for general adult medical examination without abnormal findings: Secondary | ICD-10-CM
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
## 2023-01-14 NOTE — Patient Instructions (Addendum)
[name] , Thank you for taking time to come for your Medicare Wellness Visit. I appreciate your ongoing commitment to your health goals. Please review the following plan we discussed and let me know if I can assist you in the future.   These are the goals we discussed:  Goals       To have less pain (pt-stated)        This is a list of the screening recommended for you and due dates:  Health Maintenance  Topic Date Due   Pneumonia Vaccine (1 of 2 - PCV) Never done   Zoster (Shingles) Vaccine (1 of 2) Never done   COVID-19 Vaccine (4 - 2023-24 season) 03/22/2022   Flu Shot  02/20/2023   Medicare Annual Wellness Visit  01/14/2024   DTaP/Tdap/Td vaccine (2 - Td or Tdap) 03/14/2026   DEXA scan (bone density measurement)  Completed   HPV Vaccine  Aged Out    Advanced directives: In chart  Conditions/risks identified: None  Next appointment: Follow up in one year for your annual wellness visit    Preventive Care 65 Years and Older, Female Preventive care refers to lifestyle choices and visits with your health care provider that can promote health and wellness. What does preventive care include? A yearly physical exam. This is also called an annual well check. Dental exams once or twice a year. Routine eye exams. Ask your health care provider how often you should have your eyes checked. Personal lifestyle choices, including: Daily care of your teeth and gums. Regular physical activity. Eating a healthy diet. Avoiding tobacco and drug use. Limiting alcohol use. Practicing safe sex. Taking low-dose aspirin every day. Taking vitamin and mineral supplements as recommended by your health care provider. What happens during an annual well check? The services and screenings done by your health care provider during your annual well check will depend on your age, overall health, lifestyle risk factors, and family history of disease. Counseling  Your health care provider may ask you  questions about your: Alcohol use. Tobacco use. Drug use. Emotional well-being. Home and relationship well-being. Sexual activity. Eating habits. History of falls. Memory and ability to understand (cognition). Work and work Astronomer. Reproductive health. Screening  You may have the following tests or measurements: Height, weight, and BMI. Blood pressure. Lipid and cholesterol levels. These may be checked every 5 years, or more frequently if you are over 68 years old. Skin check. Lung cancer screening. You may have this screening every year starting at age 49 if you have a 30-pack-year history of smoking and currently smoke or have quit within the past 15 years. Fecal occult blood test (FOBT) of the stool. You may have this test every year starting at age 31. Flexible sigmoidoscopy or colonoscopy. You may have a sigmoidoscopy every 5 years or a colonoscopy every 10 years starting at age 107. Hepatitis C blood test. Hepatitis B blood test. Sexually transmitted disease (STD) testing. Diabetes screening. This is done by checking your blood sugar (glucose) after you have not eaten for a while (fasting). You may have this done every 1-3 years. Bone density scan. This is done to screen for osteoporosis. You may have this done starting at age 89. Mammogram. This may be done every 1-2 years. Talk to your health care provider about how often you should have regular mammograms. Talk with your health care provider about your test results, treatment options, and if necessary, the need for more tests. Vaccines  Your health  care provider may recommend certain vaccines, such as: Influenza vaccine. This is recommended every year. Tetanus, diphtheria, and acellular pertussis (Tdap, Td) vaccine. You may need a Td booster every 10 years. Zoster vaccine. You may need this after age 54. Pneumococcal 13-valent conjugate (PCV13) vaccine. One dose is recommended after age 27. Pneumococcal polysaccharide  (PPSV23) vaccine. One dose is recommended after age 64. Talk to your health care provider about which screenings and vaccines you need and how often you need them. This information is not intended to replace advice given to you by your health care provider. Make sure you discuss any questions you have with your health care provider. Document Released: 08/04/2015 Document Revised: 03/27/2016 Document Reviewed: 05/09/2015 Elsevier Interactive Patient Education  2017 [employer] Prevention in the Home Falls can cause injuries. They can happen to people of all ages. There are many things you can do to make your home safe and to help prevent falls. What can I do on the outside of my home? Regularly fix the edges of walkways and driveways and fix any cracks. Remove anything that might make you trip as you walk through a door, such as a raised step or threshold. Trim any bushes or trees on the path to your home. Use bright outdoor lighting. Clear any walking paths of anything that might make someone trip, such as rocks or tools. Regularly check to see if handrails are loose or broken. Make sure that both sides of any steps have handrails. Any raised decks and porches should have guardrails on the edges. Have any leaves, snow, or ice cleared regularly. Use sand or salt on walking paths during winter. Clean up any spills in your garage right away. This includes oil or grease spills. What can I do in the bathroom? Use night lights. Install grab bars by the toilet and in the tub and shower. Do not use towel bars as grab bars. Use non-skid mats or decals in the tub or shower. If you need to sit down in the shower, use a plastic, non-slip stool. Keep the floor dry. Clean up any water that spills on the floor as soon as it happens. Remove soap buildup in the tub or shower regularly. Attach bath mats securely with double-sided non-slip rug tape. Do not have throw rugs and other things on the  floor that can make you trip. What can I do in the bedroom? Use night lights. Make sure that you have a light by your bed that is easy to reach. Do not use any sheets or blankets that are too big for your bed. They should not hang down onto the floor. Have a firm chair that has side arms. You can use this for support while you get dressed. Do not have throw rugs and other things on the floor that can make you trip. What can I do in the kitchen? Clean up any spills right away. Avoid walking on wet floors. Keep items that you use a lot in easy-to-reach places. If you need to reach something above you, use a strong step stool that has a grab bar. Keep electrical cords out of the way. Do not use floor polish or wax that makes floors slippery. If you must use wax, use non-skid floor wax. Do not have throw rugs and other things on the floor that can make you trip. What can I do with my stairs? Do not leave any items on the stairs. Make sure that there are handrails on  both sides of the stairs and use them. Fix handrails that are broken or loose. Make sure that handrails are as long as the stairways. Check any carpeting to make sure that it is firmly attached to the stairs. Fix any carpet that is loose or worn. Avoid having throw rugs at the top or bottom of the stairs. If you do have throw rugs, attach them to the floor with carpet tape. Make sure that you have a light switch at the top of the stairs and the bottom of the stairs. If you do not have them, ask someone to add them for you. What else can I do to help prevent falls? Wear shoes that: Do not have high heels. Have rubber bottoms. Are comfortable and fit you well. Are closed at the toe. Do not wear sandals. If you use a stepladder: Make sure that it is fully opened. Do not climb a closed stepladder. Make sure that both sides of the stepladder are locked into place. Ask someone to hold it for you, if possible. Clearly mark and make  sure that you can see: Any grab bars or handrails. First and last steps. Where the edge of each step is. Use tools that help you move around (mobility aids) if they are needed. These include: Canes. Walkers. Scooters. Crutches. Turn on the lights when you go into a dark area. Replace any light bulbs as soon as they burn out. Set up your furniture so you have a clear path. Avoid moving your furniture around. If any of your floors are uneven, fix them. If there are any pets around you, be aware of where they are. Review your medicines with your doctor. Some medicines can make you feel dizzy. This can increase your chance of falling. Ask your doctor what other things that you can do to help prevent falls. This information is not intended to replace advice given to you by your health care provider. Make sure you discuss any questions you have with your health care provider. Document Released: 05/04/2009 Document Revised: 12/14/2015 Document Reviewed: 08/12/2014 Elsevier Interactive Patient Education  2017 [employer].

## 2023-01-15 ENCOUNTER — Ambulatory Visit: Payer: Medicare HMO | Admitting: Urology

## 2023-01-16 DIAGNOSIS — M5416 Radiculopathy, lumbar region: Secondary | ICD-10-CM | POA: Diagnosis not present

## 2023-01-20 ENCOUNTER — Ambulatory Visit (INDEPENDENT_AMBULATORY_CARE_PROVIDER_SITE_OTHER): Payer: Medicare HMO

## 2023-01-20 DIAGNOSIS — G459 Transient cerebral ischemic attack, unspecified: Secondary | ICD-10-CM

## 2023-01-24 LAB — CUP PACEART REMOTE DEVICE CHECK
Date Time Interrogation Session: 20240703230606
Implantable Pulse Generator Implant Date: 20231115

## 2023-01-26 NOTE — Progress Notes (Unsigned)
Cardiology Office Note:    Date:  01/26/2023   ID:  Cheryl Martin, DOB 03-28-1931, MRN [id number]  PCP:  [name], PA   [facility] Providers Cardiologist:  Alverda Skeans, MD Referring MD: [name], PA   Chief Complaint/Reason for Referral:  Cardiology follow up  ASSESSMENT:    1. Coronary artery calcification   2. Aortic atherosclerosis (HCC)   3. Hyperlipidemia LDL goal <70   4. TIA (transient ischemic attack)     PLAN:    In order of problems listed above: 1.  Coronary artery calcification: Continue aspirin; patient is intolerant of statins.  Start Zetia 10 mg daily. 2.  Aortic atherosclerosis: Continue aspirin and start Zetia. 3.  Hyperlipidemia: Will check lipid panel, LFTs in 2 months.  Start Zetia 10 mg daily.  If not at goal will refer to pharmacy.  With a history of stroke her goal is less than 55.  Defer LP(a) testing in this almost 89 year old patient. 4.  TIA: Continue aspirin.  Linq monitor has been in place for several months and has not shown any atrial fibrillation.         {Are you ordering a CV Procedure (e.g. stress test, cath, DCCV, TEE, etc)?   Press F2        :[id number]}   Dispo:  No follow-ups on file.      Medication Adjustments/Labs and Tests Ordered: Current medicines are reviewed at length with the patient today.  Concerns regarding medicines are outlined above.  The following changes have been made:  {PLAN; NO CHANGE:13088:s}   Labs/tests ordered: No orders of the defined types were placed in this encounter.   Medication Changes: No orders of the defined types were placed in this encounter.   Current medicines are reviewed at length with the patient today.  The patient {ACTIONS; HAS/DOES NOT HAVE:19233} concerns regarding medicines.  History of Present Illness:    FOCUSED PROBLEM LIST:   1. Multivessel coronary artery calcification CT scan 2020 2. Aortic atherosclerosis 2020 3. Hyperlipidemia 4. Statin  intolerance in response to Crestor  5. TIA May 2023; negative bubble study and a Linq monitor in place.  The patient is a 89 y.o. female with the indicated medical history here for cardiology follow-up.  She was seen in our office in May after hospitalization for TIA.  She had been on dual and platelet therapy at that time.  She was also started on Crestor 20 mg.  She was referred for a 30-day event monitor which demonstrated no atrial fibrillation.  A Linq monitor was eventually placed.  Today:       Previous Medical History: Past Medical History:  Diagnosis Date   ADD (attention deficit disorder)    Allergy    Anemia    vit b12 and vit d deficiencies   Arthritis    osteoarthritis   Back spasm    Bronchitis    Complication of anesthesia    slow to wake up after colonoscopy   Coronary artery disease    DDD (degenerative disc disease), lumbar    Edema    Family history of adverse reaction to anesthesia    Daughter also has PONV   Hearing loss    History of hiatal hernia    hx of   Hyperlipidemia    Hypothyroidism    hx of as child and during pregnancy   Neuromuscular disorder (HCC)    pt unsure of what this is.   Nocturia  Pneumonia 02/2020   frequent bouts of bronchitis/pneumonia. has zithromax to take at start of it; bronchiectasis 01/12/19 CT.   PONV (postoperative nausea and vomiting)    Stroke (HCC) 12/2018   tia. no residual   Thyroid disease      Current Medications: No outpatient medications have been marked as taking for the 01/30/23 encounter (Appointment) with Orbie Pyo, MD.     Allergies:    Levaquin [levofloxacin], Oxybutynin, Tape, Beef-derived products, Flexeril [cyclobenzaprine], Mold extract [trichophyton], and Pollen extract   Social History:   Social History   Tobacco Use   Smoking status: Former    Packs/day: 0.25    Years: 22.00    Additional pack years: 0.00    Total pack years: 5.50    Types: Cigarettes    Quit date: 07/22/1973     Years since quitting: 49.5    Passive exposure: Past   Smokeless tobacco: Never   Tobacco comments:    smoked off and on for 22 years "social smoker"  Vaping Use   Vaping Use: Never used  Substance Use Topics   Alcohol use: No    Alcohol/week: 0.0 standard drinks of alcohol   Drug use: No     Family Hx: Family History  Problem Relation Age of Onset   Colon cancer Brother    Stomach cancer Brother    Pancreatic cancer Neg Hx    Liver cancer Neg Hx      Review of Systems:   Please see the history of present illness.    All other systems reviewed and are negative.     EKGs/Labs/Other Test Reviewed:    EKG:    EKG Interpretation Date/Time:    Ventricular Rate:    PR Interval:    QRS Duration:    QT Interval:    QTC Calculation:   R Axis:      Text Interpretation:           Prior CV studies reviewed:  Cardiac Studies & Procedures     STRESS TESTS  MYOCARDIAL PERFUSION IMAGING 10/26/2018  Narrative  Nuclear stress EF: 63%.  There was no ST segment deviation noted during stress.  The study is normal.  This is a low risk study.  The left ventricular ejection fraction is normal (55-65%).  Normal pharmacologic nuclear stress test with no evidence for a prior infarct or ischemia.   ECHOCARDIOGRAM  ECHOCARDIOGRAM COMPLETE BUBBLE STUDY 11/27/2021  Narrative ECHOCARDIOGRAM REPORT    Patient Name:   Cheryl Martin Date of Exam: 11/27/2021 Medical Rec #:  [id number]         Height:       62.0 in Accession #:    [id number]        Weight:       130.0 lb Date of Birth:  March 26, 1931        BSA:          1.592 m Patient Age:    89 years          BP:           102/63 mmHg Patient Gender: F                 HR:           78 bpm. Exam Location:  Inpatient  Procedure: 2D Echo, Cardiac Doppler, Color Doppler and Saline Contrast Bubble Study  Indications:    TIA (transient ischemic attack) 435.9 / G45.9  History:  Patient has no prior history of  Echocardiogram examinations. CAD; Risk Factors:Dyslipidemia.  Sonographer:    Leta Jungling RDCS Referring Phys: [id number] CAROLE N HALL  IMPRESSIONS   1. Left ventricular ejection fraction, by estimation, is 60 to 65%. The left ventricle has normal function. The left ventricle has no regional wall motion abnormalities. Left ventricular diastolic parameters are consistent with Grade I diastolic dysfunction (impaired relaxation). 2. Right ventricular systolic function is normal. The right ventricular size is normal. There is normal pulmonary artery systolic pressure. The estimated right ventricular systolic pressure is 30.9 mmHg. 3. The mitral valve is grossly normal. Trivial mitral valve regurgitation. No evidence of mitral stenosis. 4. The aortic valve is tricuspid. Aortic valve regurgitation is not visualized. No aortic stenosis is present. 5. The inferior vena cava is normal in size with greater than 50% respiratory variability, suggesting right atrial pressure of 3 mmHg. 6. Agitated saline contrast bubble study was negative, with no evidence of any interatrial shunt.  Conclusion(s)/Recommendation(s): No intracardiac source of embolism detected on this transthoracic study. Consider a transesophageal echocardiogram to exclude cardiac source of embolism if clinically indicated.  FINDINGS Left Ventricle: Left ventricular ejection fraction, by estimation, is 60 to 65%. The left ventricle has normal function. The left ventricle has no regional wall motion abnormalities. The left ventricular internal cavity size was normal in size. There is no left ventricular hypertrophy. Left ventricular diastolic parameters are consistent with Grade I diastolic dysfunction (impaired relaxation).  Right Ventricle: The right ventricular size is normal. No increase in right ventricular wall thickness. Right ventricular systolic function is normal. There is normal pulmonary artery systolic pressure. The tricuspid  regurgitant velocity is 2.64 m/s, and with an assumed right atrial pressure of 3 mmHg, the estimated right ventricular systolic pressure is 30.9 mmHg.  Left Atrium: Left atrial size was normal in size.  Right Atrium: Right atrial size was normal in size.  Pericardium: Trivial pericardial effusion is present.  Mitral Valve: The mitral valve is grossly normal. There is mild calcification of the anterior mitral valve leaflet(s). Trivial mitral valve regurgitation. No evidence of mitral valve stenosis.  Tricuspid Valve: The tricuspid valve is grossly normal. Tricuspid valve regurgitation is trivial. No evidence of tricuspid stenosis.  Aortic Valve: The aortic valve is tricuspid. Aortic valve regurgitation is not visualized. No aortic stenosis is present.  Pulmonic Valve: The pulmonic valve was grossly normal. Pulmonic valve regurgitation is not visualized. No evidence of pulmonic stenosis.  Aorta: The aortic root and ascending aorta are structurally normal, with no evidence of dilitation.  Venous: The right lower pulmonary vein is normal. The inferior vena cava is normal in size with greater than 50% respiratory variability, suggesting right atrial pressure of 3 mmHg.  IAS/Shunts: The atrial septum is grossly normal. Agitated saline contrast was given intravenously to evaluate for intracardiac shunting. Agitated saline contrast bubble study was negative, with no evidence of any interatrial shunt.   LEFT VENTRICLE PLAX 2D LVIDd:         3.70 cm   Diastology LVIDs:         2.30 cm   LV e' medial:    6.20 cm/s LV PW:         0.70 cm   LV E/e' medial:  9.5 LV IVS:        1.00 cm   LV e' lateral:   8.81 cm/s LVOT diam:     2.00 cm   LV E/e' lateral: 6.7 LV SV:  60 LV SV Index:   37 LVOT Area:     3.14 cm   RIGHT VENTRICLE RV S prime:     16.70 cm/s TAPSE (M-mode): 2.4 cm  LEFT ATRIUM             Index        RIGHT ATRIUM          Index LA diam:        3.00 cm 1.88 cm/m   RA  Area:     9.66 cm LA Vol (A2C):   20.3 ml 12.75 ml/m  RA Volume:   15.70 ml 9.86 ml/m LA Vol (A4C):   33.9 ml 21.30 ml/m LA Biplane Vol: 26.4 ml 16.58 ml/m AORTIC VALVE LVOT Vmax:   80.90 cm/s LVOT Vmean:  58.700 cm/s LVOT VTI:    0.190 m  AORTA Ao Root diam: 3.00 cm Ao Asc diam:  2.50 cm  MITRAL VALVE               TRICUSPID VALVE MV Area (PHT): 2.29 cm    TR Peak grad:   27.9 mmHg MV Decel Time: 331 msec    TR Vmax:        264.00 cm/s MV E velocity: 59.10 cm/s MV A velocity: 67.70 cm/s  SHUNTS MV E/A ratio:  0.87        Systemic VTI:  0.19 m Systemic Diam: 2.00 cm  Lennie Odor MD Electronically signed by Lennie Odor MD Signature Date/Time: 11/27/2021/4:32:40 PM    Final    MONITORS  CARDIAC EVENT MONITOR 01/14/2022  Narrative 1.  Monitoring time from May 25 to January 11, 2022. 2.  Heart rate varied from 51 to 123 bpm with an average of 72 bpm. 3.  1 episode of NSVT. 4.  No atrial fibrillation, sustained ventricular arrhythmias, or bradycardia arrhythmias.           Other studies Reviewed: Review of the additional studies/records demonstrates: Aortic atherosclerosis on CT abdomen pelvis 2024  Recent Labs: 12/10/2022: Magnesium 1.7 12/15/2022: ALT 16; BUN 12; Creatinine, Ser 0.75; Hemoglobin 11.2; Platelets 320; Potassium 3.9; Sodium 132   Lipid Panel    Component Value Date/Time   CHOL 204 (H) 10/25/2022 1020   TRIG 73 10/25/2022 1020   HDL 80 10/25/2022 1020   CHOLHDL 2.6 10/25/2022 1020   CHOLHDL 3.2 11/27/2021 0359   VLDL 20 11/27/2021 0359   LDLCALC 111 (H) 10/25/2022 1020    Risk Assessment/Calculations:    {Does this patient have ATRIAL FIBRILLATION?:[phone]}      No BP recorded.  {Refresh Note OR Click here to enter BP  :1}***    Physical Exam:    VS:  There were no vitals taken for this visit.   Wt Readings from Last 3 Encounters:  01/14/23 120 lb (54.4 kg)  01/02/23 120 lb (54.4 kg)  01/01/23 121 lb (54.9 kg)    No BP recorded.   {Refresh Note OR Click here to enter BP  :1}***    GENERAL:  No apparent distress, AOx3 HEENT:  No carotid bruits, +2 carotid impulses, no scleral icterus CAR: RRR Irregular RR*** no murmurs***, gallops, rubs, or thrills RES:  Clear to auscultation bilaterally ABD:  Soft, nontender, nondistended, positive bowel sounds x 4 VASC:  +2 radial pulses, +2 carotid pulses, palpable pedal pulses NEURO:  CN 2-12 grossly intact; motor and sensory grossly intact PSYCH:  No active depression or anxiety EXT:  No edema, ecchymosis, or cyanosis  Signed, Charlies Constable  Lynnette Caffey, MD  01/26/2023 10:51 AM    [facility] [address] [place], [place], [place]  [address] Phone: [phone]; Fax: [phone]   Note:  This document was prepared using Dragon voice recognition software and may include unintentional dictation errors.

## 2023-01-28 DIAGNOSIS — M5416 Radiculopathy, lumbar region: Secondary | ICD-10-CM | POA: Diagnosis not present

## 2023-01-30 ENCOUNTER — Ambulatory Visit: Payer: Medicare HMO | Attending: Internal Medicine | Admitting: Internal Medicine

## 2023-01-30 ENCOUNTER — Encounter: Payer: Self-pay | Admitting: Internal Medicine

## 2023-01-30 VITALS — BP 126/62 | HR 70 | Ht 63.0 in | Wt 118.8 lb

## 2023-01-30 DIAGNOSIS — I2510 Atherosclerotic heart disease of native coronary artery without angina pectoris: Secondary | ICD-10-CM | POA: Diagnosis not present

## 2023-01-30 DIAGNOSIS — G459 Transient cerebral ischemic attack, unspecified: Secondary | ICD-10-CM | POA: Diagnosis not present

## 2023-01-30 DIAGNOSIS — E785 Hyperlipidemia, unspecified: Secondary | ICD-10-CM

## 2023-01-30 DIAGNOSIS — I2584 Coronary atherosclerosis due to calcified coronary lesion: Secondary | ICD-10-CM | POA: Diagnosis not present

## 2023-01-30 DIAGNOSIS — I700 Atherosclerosis of aorta: Secondary | ICD-10-CM

## 2023-01-30 MED ORDER — EZETIMIBE 10 MG PO TABS: 10.0000 mg | ORAL_TABLET | Freq: Every day | ORAL | 3 refills | Status: DC

## 2023-01-30 NOTE — Patient Instructions (Signed)
Medication Instructions:  Your physician has recommended you make the following change in your medication:  1.) start Zetia 10 mg - one tablet daily   *If you need a refill on your cardiac medications before your next appointment, please call your pharmacy*   Lab Work: none If you have labs (blood work) drawn today and your tests are completely normal, you will receive your results only by: MyChart Message (if you have MyChart) OR A paper copy in the mail If you have any lab test that is abnormal or we need to change your treatment, we will call you to review the results.   Testing/Procedures: none   Follow-Up: At [facility] you and your health needs are our priority.  As part of our continuing mission to provide you with exceptional heart care, we have created designated Provider Care Teams.  These Care Teams include your primary Cardiologist (physician) and Advanced Practice Providers (APPs -  Physician Assistants and Nurse Practitioners) who all work together to provide you with the care you need, when you need it.  We recommend signing up for the patient portal called "MyChart".  Sign up information is provided on this After Visit Summary.  MyChart is used to connect with patients for Virtual Visits (Telemedicine).  Patients are able to view lab/test results, encounter notes, upcoming appointments, etc.  Non-urgent messages can be sent to your provider as well.   To learn more about what you can do with MyChart, go to [email or web address].    Your next appointment:   6 month(s)  Provider:   Jari Favre, PA-C, Ronie Spies, PA-C, Robin Searing, NP, Jacolyn Reedy, PA-C, Eligha Bridegroom, NP, Tereso Newcomer, PA-C, or Perlie Gold, PA-C

## 2023-02-04 ENCOUNTER — Ambulatory Visit: Payer: Medicare HMO | Admitting: Urology

## 2023-02-04 DIAGNOSIS — M5416 Radiculopathy, lumbar region: Secondary | ICD-10-CM | POA: Diagnosis not present

## 2023-02-10 NOTE — Progress Notes (Signed)
[facility] Summary Report / Loop Recorder 

## 2023-02-11 DIAGNOSIS — M4316 Spondylolisthesis, lumbar region: Secondary | ICD-10-CM | POA: Diagnosis not present

## 2023-02-20 ENCOUNTER — Ambulatory Visit (INDEPENDENT_AMBULATORY_CARE_PROVIDER_SITE_OTHER): Payer: Medicare HMO

## 2023-02-20 DIAGNOSIS — G459 Transient cerebral ischemic attack, unspecified: Secondary | ICD-10-CM | POA: Diagnosis not present

## 2023-03-05 NOTE — Progress Notes (Signed)
[facility] Summary Report / Loop Recorder 

## 2023-03-12 ENCOUNTER — Telehealth: Payer: Self-pay

## 2023-03-12 ENCOUNTER — Ambulatory Visit: Payer: Medicare HMO | Admitting: Urology

## 2023-03-12 VITALS — BP 130/62 | HR 119 | Ht 63.0 in | Wt 119.0 lb

## 2023-03-12 DIAGNOSIS — Z9229 Personal history of other drug therapy: Secondary | ICD-10-CM

## 2023-03-12 DIAGNOSIS — N3281 Overactive bladder: Secondary | ICD-10-CM | POA: Diagnosis not present

## 2023-03-12 LAB — URINALYSIS, COMPLETE
Bilirubin, UA: NEGATIVE
Glucose, UA: NEGATIVE
Ketones, UA: NEGATIVE
Leukocytes,UA: NEGATIVE
Nitrite, UA: NEGATIVE
Protein,UA: NEGATIVE
Specific Gravity, UA: 1.01 (ref 1.005–1.030)
Urobilinogen, Ur: 0.2 mg/dL (ref 0.2–1.0)
pH, UA: 6.5 (ref 5.0–7.5)

## 2023-03-12 LAB — MICROSCOPIC EXAMINATION

## 2023-03-12 MED ORDER — CEPHALEXIN 250 MG PO CAPS
500.0000 mg | ORAL_CAPSULE | Freq: Once | ORAL | Status: AC
Start: 2023-03-12 — End: 2023-03-12
  Administered 2023-03-12: 500 mg via ORAL

## 2023-03-12 MED ORDER — ONABOTULINUMTOXINA 100 UNITS IJ SOLR
100.0000 [IU] | Freq: Once | INTRAMUSCULAR | Status: AC
Start: 2023-03-12 — End: 2023-03-12
  Administered 2023-03-12: 100 [IU] via INTRAMUSCULAR

## 2023-03-12 NOTE — Telephone Encounter (Signed)
Just Cheryl Martin, patient is already scheduled for 6 months since PA is still good until May 2025. Patient  was advised to let us know if her insurance changes next year so we can update PA. I was not able to update your botox spreadsheet with this information as it is read only for me. Thank you

## 2023-03-12 NOTE — Progress Notes (Signed)
   03/12/23   CC:  Chief Complaint  Patient presents with   Botulinum Toxin Injection    HPI:   Cheryl Martin is a 89 y.o. female with a personal history of OAB and urge incontinence, who presents today for an office Botox injection.    She mentions today that she had abdominal back surgery.  She is 2 months overdue for further injection is back to heavy pads.  She is very anxious/excited to get her Botox today.  It has been working very well.  Patient was administered the appropriate periprocedural antibiotics if indicated.  Lidocaine was allowed to dwell in the bladder for 30 minutes prior to the procedure, see CMA note.  Consent was confirmed.  All questions answered.  Timeout was performed.  Her last injection was on 1/24.   Vitals:   03/12/23 1036  BP: 130/62  Pulse: (!) 119  NED. A&Ox3.   No respiratory distress   Abd soft, NT, ND Normal external genitalia with patent urethral meatus  Cystoscopy Procedure Note   Patient identification was confirmed, informed consent was obtained, and patient was prepped using Betadine solution.  Lidocaine jelly was administered per urethral meatus.     Procedure: - Flexible cystoscope introduced, without any difficulty.   - Thorough search of the bladder revealed:    normal urethral meatus    normal urothelium    no stones    no ulcers     no tumors    no urethral polyps    no trabeculation   - Ureteral orifices were normal in position and appearance.   A Botox injection needle was used to inject a total of 100 units which was reconstituted in a total of 10 cc of saline.  A total of 2 rows of 5 injections (10 mL) was injected into the muscularis of the bladder.  This was well-tolerated.  There is slight oozing from a few of the injection sites but no diffuse bleeding.   Post-Procedure: - Patient tolerated the procedure well   Patient will f/u when she is due for her next Botox injection.   Periprocedual abx  given  Assessment and Plan:   1. Overactive bladder - lidocaine (XYLOCAINE) 2 % (with pres) injection 1,000 mg - botulinum toxin Type A (BOTOX) injection 100 Units - Keflex 500 mg PO x 1   2. Urge incontinence - lidocaine (XYLOCAINE) 2 % (with pres) injection 1,000 mg - botulinum toxin Type A (BOTOX) injection 100 Units   F/u 6 mo botox .   Vanna Scotland, MD

## 2023-03-12 NOTE — Progress Notes (Signed)
Bladder Instillation  Due to Botox injection patient is present today for a Bladder Instillation of Lidocaine 2%. Patient was cleaned and prepped in a sterile fashion with betadine and lidocaine 2% jelly was instilled into the urethra.  A 14 FR catheter was inserted, urine return was noted 50 ml, urine was light yellow in color.  60 ml was instilled into the bladder. The catheter was then removed. Patient tolerated well, no complications were noted. Patient held in bladder for 30 minutes prior to procedure starting.   Performed by: Amey Hossain H RMA  Follow up/ Additional notes:

## 2023-03-12 NOTE — Telephone Encounter (Signed)
Cheryl Martin [id number] [birth year]   Provider- Dr Apolinar Junes   Procedure [id number] Drug [id number]    OAB- N32.81:_______X__________________  Neurogenic Bladder N31.2: ______________  Mixed Incontinence N39.46_______________  Urge Incontinence N39.41 ________________  Units: 100___X_____           200____________  Expected date of injection:__2/25/25-scheduled______________   Below to be completed by staff member contacting insurance.   Botox verification completed- Yes  Pa needed- Yes  Insurance contacted - complete         Auth number:____[id number] _________________  Approval dates : _5/6/24-5/6/25_________________     Botox scheduled:________2/25/25______________________________  U/A & Culture ordered and scheduled:_patient will get this done same say, ok per provider__________________  Pt aware and instructions given.  Botox put aside with patient's name in the fridge.

## 2023-03-17 DIAGNOSIS — H903 Sensorineural hearing loss, bilateral: Secondary | ICD-10-CM | POA: Diagnosis not present

## 2023-03-25 ENCOUNTER — Ambulatory Visit (INDEPENDENT_AMBULATORY_CARE_PROVIDER_SITE_OTHER): Payer: Medicare HMO

## 2023-03-25 DIAGNOSIS — G459 Transient cerebral ischemic attack, unspecified: Secondary | ICD-10-CM

## 2023-03-25 LAB — CUP PACEART REMOTE DEVICE CHECK
Date Time Interrogation Session: 20240902231031
Implantable Pulse Generator Implant Date: 20231115

## 2023-04-01 ENCOUNTER — Other Ambulatory Visit: Payer: Self-pay

## 2023-04-01 DIAGNOSIS — E785 Hyperlipidemia, unspecified: Secondary | ICD-10-CM

## 2023-04-01 DIAGNOSIS — Z0000 Encounter for general adult medical examination without abnormal findings: Secondary | ICD-10-CM

## 2023-04-01 DIAGNOSIS — I10 Essential (primary) hypertension: Secondary | ICD-10-CM

## 2023-04-07 ENCOUNTER — Other Ambulatory Visit: Payer: Medicare HMO

## 2023-04-07 ENCOUNTER — Encounter: Payer: Self-pay | Admitting: Family Medicine

## 2023-04-07 ENCOUNTER — Ambulatory Visit (INDEPENDENT_AMBULATORY_CARE_PROVIDER_SITE_OTHER): Payer: Medicare HMO | Admitting: Family Medicine

## 2023-04-07 VITALS — BP 121/64 | HR 57 | Resp 18 | Ht 63.0 in | Wt 121.0 lb

## 2023-04-07 DIAGNOSIS — Z131 Encounter for screening for diabetes mellitus: Secondary | ICD-10-CM | POA: Diagnosis not present

## 2023-04-07 DIAGNOSIS — E785 Hyperlipidemia, unspecified: Secondary | ICD-10-CM

## 2023-04-07 DIAGNOSIS — I10 Essential (primary) hypertension: Secondary | ICD-10-CM | POA: Diagnosis not present

## 2023-04-07 DIAGNOSIS — Z1329 Encounter for screening for other suspected endocrine disorder: Secondary | ICD-10-CM

## 2023-04-07 NOTE — Progress Notes (Signed)
[facility] Summary Report / Loop Recorder 

## 2023-04-07 NOTE — Assessment & Plan Note (Signed)
Last lipid panel LDL 111, HDL 80, triglycerides 73.  Cholesterol panel and CMP drawn earlier today today.  Continue ezetimibe 10 mg daily unless lab results warrant change.

## 2023-04-07 NOTE — Patient Instructions (Signed)
After age 87, there is a decline in how strong your immune system is, this is where we start seeing an increase in complications from illnesses such as pneumonia that the body may have previously been able to fight off.  The trends indicate that after age 51, there is a much higher risk of needing to go to the hospital due to pneumonia.  This is the reason that routine protection from vaccines like the pneumonia vaccine and the shingles vaccine are recommended for all adults at certain ages.  I have included some information for you to learn more.  If you decide to get either of them, you can find them at your local pharmacy!

## 2023-04-07 NOTE — Assessment & Plan Note (Signed)
Stable.  Continue low-salt diet and activity as tolerated.  Will continue to monitor.

## 2023-04-07 NOTE — Progress Notes (Signed)
Established Patient Office Visit  Subjective   Patient ID: Cheryl Martin, female    DOB: 1931-03-29  Age: 89 y.o. MRN: [id number]  Chief Complaint  Patient presents with   Hyperlipidemia    HPI Cheryl Martin is a 89 y.o. female presenting today for follow up of hyperlipidemia.  She has an old dog who is blind and deaf now, and recently a cat they originally thought was feral has started hanging out more and is too friendly to be feral.  It has been interesting for her to watch the two of them interact. Hyperlipidemia: tolerating ezetimibe well with no myalgias or significant side effects.  She is followed by cardiology who manages her ezetimibe prescription. The ASCVD Risk score (Arnett DK, et al., 2019) failed to calculate for the following reasons:   The 2019 ASCVD risk score is only valid for ages 66 to 34   The patient has a prior MI or stroke diagnosis  Outpatient Medications Prior to Visit  Medication Sig   aspirin EC (ASPIRIN LOW DOSE) 81 MG tablet Take 1 tablet (81 mg total) by mouth daily. Swallow whole.   Bioflavonoid Products (VITAMIN C) CHEW Chew 1 tablet by mouth daily.   BIOTIN PO Take 1 tablet by mouth daily.   Cholecalciferol (VITAMIN D-3) 25 MCG (1000 UT) CAPS Take 1,000 Units by mouth daily with breakfast.   Cyanocobalamin (B-12) 1000 MCG SUBL Place 2,000 mcg under the tongue daily.   donepezil (ARICEPT) 5 MG tablet Take 1 tablet (5 mg total) by mouth at bedtime.   ezetimibe (ZETIA) 10 MG tablet Take 1 tablet (10 mg total) by mouth daily.   Multiple Vitamin (MULTIVITAMIN WITH MINERALS) TABS tablet Take 1 tablet by mouth daily.   Omega 3-6-9 Fatty Acids (OMEGA 3-6-9 PO) Take by mouth.   ondansetron (ZOFRAN) 4 MG tablet Take 1 tablet (4 mg total) by mouth daily as needed for nausea or vomiting.   OVER THE COUNTER MEDICATION Take 2 tablets by mouth daily. Lions mane   Sodium Chloride, Hypertonic, (MURO 128 OP) Place 1 drop into both eyes 2 (two) times daily as  needed (dry eyes).   Turmeric, Curcuma Longa, (CURCUMIN) POWD by Does not apply route.   nitroGLYCERIN (NITROSTAT) 0.4 MG SL tablet Place 1 tablet (0.4 mg total) under the tongue every 5 (five) minutes as needed for chest pain.   No facility-administered medications prior to visit.    ROS Negative unless otherwise noted in HPI   Objective:     BP 121/64 (BP Location: Left Arm, Patient Position: Sitting, Cuff Size: Normal)   Pulse (!) 57   Resp 18   Ht 5\' 3"  (1.6 m)   Wt 121 lb (54.9 kg)   SpO2 100%   BMI 21.43 kg/m   Physical Exam Constitutional:      General: She is not in acute distress.    Appearance: Normal appearance.  HENT:     Head: Normocephalic and atraumatic.  Cardiovascular:     Rate and Rhythm: Normal rate and regular rhythm.     Heart sounds: No murmur heard.    No friction rub. No gallop.  Pulmonary:     Effort: Pulmonary effort is normal. No respiratory distress.     Breath sounds: No wheezing, rhonchi or rales.  Skin:    General: Skin is warm and dry.  Neurological:     Mental Status: She is alert and oriented to person, place, and time.     Assessment &  Plan:  Essential hypertension Assessment & Plan: Stable.  Continue low-salt diet and activity as tolerated.  Will continue to monitor.  Orders: -     Hemoglobin A1c -     CBC with Differential/Platelet -     Comprehensive metabolic panel  Hyperlipidemia, unspecified hyperlipidemia type Assessment & Plan: Last lipid panel LDL 111, HDL 80, triglycerides 73.  Cholesterol panel and CMP drawn earlier today today.  Continue ezetimibe 10 mg daily unless lab results warrant change.  Orders: -     Lipid panel -     CBC with Differential/Platelet -     Comprehensive metabolic panel  Screening for thyroid disorder -     TSH  Screening for diabetes mellitus -     Hemoglobin A1c    Return in about 6 months (around 10/05/2023) for annual physical, fasting blood work 1 week before.    [name], PA

## 2023-04-08 LAB — COMPREHENSIVE METABOLIC PANEL
ALT: 14 IU/L (ref 0–32)
AST: 24 IU/L (ref 0–40)
Albumin: 4.2 g/dL (ref 3.7–4.7)
Alkaline Phosphatase: 98 IU/L (ref 44–121)
BUN/Creatinine Ratio: 17 (ref 12–28)
BUN: 13 mg/dL (ref 8–27)
Bilirubin Total: 0.4 mg/dL (ref 0.0–1.2)
CO2: 25 mmol/L (ref 20–29)
Calcium: 9.7 mg/dL (ref 8.7–10.3)
Chloride: 101 mmol/L (ref 96–106)
Creatinine, Ser: 0.76 mg/dL (ref 0.57–1.00)
Globulin, Total: 1.7 g/dL (ref 1.5–4.5)
Glucose: 81 mg/dL (ref 70–99)
Potassium: 4.2 mmol/L (ref 3.5–5.2)
Sodium: 139 mmol/L (ref 134–144)
Total Protein: 5.9 g/dL — ABNORMAL LOW (ref 6.0–8.5)
eGFR: 75 mL/min/{1.73_m2} (ref >59)

## 2023-04-08 LAB — CBC WITH DIFFERENTIAL/PLATELET
Basophils Absolute: 0.1 10*3/uL (ref 0.0–0.2)
Basos: 1 %
EOS (ABSOLUTE): 0.2 10*3/uL (ref 0.0–0.4)
Eos: 5 %
Hematocrit: 38.1 % (ref 34.0–46.6)
Hemoglobin: 12.2 g/dL (ref 11.1–15.9)
Immature Grans (Abs): 0 10*3/uL (ref 0.0–0.1)
Immature Granulocytes: 0 %
Lymphocytes Absolute: 1 10*3/uL (ref 0.7–3.1)
Lymphs: 23 %
MCH: 30 pg (ref 26.6–33.0)
MCHC: 32 g/dL (ref 31.5–35.7)
MCV: 94 fL (ref 79–97)
Monocytes Absolute: 0.5 10*3/uL (ref 0.1–0.9)
Monocytes: 10 %
Neutrophils Absolute: 2.7 10*3/uL (ref 1.4–7.0)
Neutrophils: 61 %
Platelets: 247 10*3/uL (ref 150–450)
RBC: 4.07 x10E6/uL (ref 3.77–5.28)
RDW: 11.9 % (ref 11.7–15.4)
WBC: 4.5 10*3/uL (ref 3.4–10.8)

## 2023-04-08 LAB — LIPID PANEL
Chol/HDL Ratio: 2.3 ratio (ref 0.0–4.4)
Cholesterol, Total: 179 mg/dL (ref 100–199)
HDL: 79 mg/dL (ref >39)
LDL Chol Calc (NIH): 85 mg/dL (ref 0–99)
Triglycerides: 84 mg/dL (ref 0–149)
VLDL Cholesterol Cal: 15 mg/dL (ref 5–40)

## 2023-04-08 LAB — HEMOGLOBIN A1C
Est. average glucose Bld gHb Est-mCnc: 111 mg/dL
Hgb A1c MFr Bld: 5.5 % (ref 4.8–5.6)

## 2023-04-08 LAB — TSH: TSH: 2.78 u[IU]/mL (ref 0.450–4.500)

## 2023-04-14 ENCOUNTER — Encounter: Payer: Medicare HMO | Admitting: Family Medicine

## 2023-04-15 ENCOUNTER — Ambulatory Visit (INDEPENDENT_AMBULATORY_CARE_PROVIDER_SITE_OTHER): Payer: Medicare HMO | Admitting: Family Medicine

## 2023-04-15 DIAGNOSIS — R300 Dysuria: Secondary | ICD-10-CM | POA: Diagnosis not present

## 2023-04-15 LAB — POCT URINALYSIS DIP (CLINITEK)
Bilirubin, UA: NEGATIVE
Glucose, UA: NEGATIVE mg/dL
Ketones, POC UA: NEGATIVE mg/dL
Nitrite, UA: NEGATIVE
POC PROTEIN,UA: NEGATIVE
Spec Grav, UA: 1.01 (ref 1.010–1.025)
Urobilinogen, UA: 0.2 E.U./dL
pH, UA: 6.5 (ref 5.0–8.0)

## 2023-04-15 NOTE — Progress Notes (Signed)
Patient is her for a urine smple

## 2023-04-19 LAB — URINE CULTURE

## 2023-04-21 ENCOUNTER — Other Ambulatory Visit: Payer: Self-pay | Admitting: Family Medicine

## 2023-04-21 DIAGNOSIS — N3000 Acute cystitis without hematuria: Secondary | ICD-10-CM

## 2023-04-21 MED ORDER — SULFAMETHOXAZOLE-TRIMETHOPRIM 800-160 MG PO TABS
1.0000 | ORAL_TABLET | Freq: Two times a day (BID) | ORAL | 0 refills | Status: DC
Start: 2023-04-21 — End: 2023-04-24

## 2023-04-24 ENCOUNTER — Encounter: Payer: Self-pay | Admitting: Internal Medicine

## 2023-04-24 ENCOUNTER — Ambulatory Visit: Payer: Medicare HMO | Admitting: Internal Medicine

## 2023-04-24 VITALS — BP 118/56 | HR 64 | Temp 97.3°F | Ht 63.0 in | Wt 121.6 lb

## 2023-04-24 DIAGNOSIS — J479 Bronchiectasis, uncomplicated: Secondary | ICD-10-CM | POA: Diagnosis not present

## 2023-04-24 NOTE — Progress Notes (Signed)
Cheryl Martin, female    DOB: [birth year],    MRN: [id number]   Brief patient profile:  25 yowf MM/quit smoking 1975 moved from [place] to [place] in 1984 with recurrent episodes of  bronchitis variable times every since arrival in [place] with some seasonal rhinitis esp in Spring and abnormal cxr since around 2017 with new dx of bronchiectasis by CT chest  01/12/2019 so referred to pulmonary clinic 03/02/2019 by Dr   Sharee Holster     History of Present Illness  03/02/2019  Pulmonary/ 1st office eval/Cheryl Martin  Chief Complaint  Patient presents with   Pulmonary Consult    Referred by Dr Sharee Holster for bronchiectasis.    Dyspnea:  Can walk 25 min flat around a track each lap takes a min avg pace -faster most people her age Cough: just with flares / really not significant production chronically, just during flares of what she's been calling bronchitis which is only once a year at most.  Sleep: nasal congestion but doesn't keep her up, one pillow  SABA use: none New abd pain x 4 months no relation with meals  rec GERD diet  Bronchiectasis =    Whenever you develop cough congestion take mucinex or mucinex dm up to 1200 mg every 12 hours as needed          04/24/2023  f/u ov/Samiya Mervin re: bronchiectasis s airflow obst maint on no resp rx/  uses mucinex prn and zpak for flares   Chief Complaint  Patient presents with   Follow-up    Runny nose persistent.  Otherwise, doing well.  Dyspnea:  still limited by L leg radicular pain - not doe Cough: mostly triggered  by watery nose dripping / has not tried nasal steroids or atrovent  Sleeping: on side bed is flat / one pillow with once nightly  SABA use: none  02: none     No obvious day to day or daytime variability or assoc excess/ purulent sputum or mucus plugs or hemoptysis or cp or chest tightness, subjective wheeze or overt   hb symptoms.    Also denies any obvious fluctuation of symptoms with weather or environmental changes or other aggravating or alleviating  factors except as outlined above   No unusual exposure hx or h/o childhood pna/ asthma or knowledge of premature birth.  Current Allergies, Complete Past Medical History, Past Surgical History, Family History, and Social History were reviewed in [employer] record.  ROS  The following are not active complaints unless bolded Hoarseness, sore throat, dysphagia, dental problems, itching, sneezing,  nasal congestion or discharge of excess mucus or purulent secretions, ear ache,   fever, chills, sweats, unintended wt loss or wt gain, classically pleuritic or exertional cp,  orthopnea pnd or arm/hand swelling  or leg swelling, presyncope, palpitations, abdominal pain, anorexia, nausea, vomiting, diarrhea  or change in bowel habits or change in bladder habits, change in stools or change in urine, dysuria, hematuria,  rash, arthralgias, visual complaints, headache, numbness, weakness or ataxia or problems with walking or coordination,  change in mood or  memory.        Current Meds  Medication Sig   aspirin EC (ASPIRIN LOW DOSE) 81 MG tablet Take 1 tablet (81 mg total) by mouth daily. Swallow whole.   Bioflavonoid Products (VITAMIN C) CHEW Chew 1 tablet by mouth daily.   BIOTIN PO Take 1 tablet by mouth daily.   Cholecalciferol (VITAMIN D-3) 25 MCG (1000 UT) CAPS Take 1,000 Units  by mouth daily with breakfast.   Cyanocobalamin (B-12) 1000 MCG SUBL Place 2,000 mcg under the tongue daily.   donepezil (ARICEPT) 5 MG tablet Take 1 tablet (5 mg total) by mouth at bedtime.   ezetimibe (ZETIA) 10 MG tablet Take 1 tablet (10 mg total) by mouth daily.   Multiple Vitamin (MULTIVITAMIN WITH MINERALS) TABS tablet Take 1 tablet by mouth daily.   Omega 3-6-9 Fatty Acids (OMEGA 3-6-9 PO) Take by mouth.   OVER THE COUNTER MEDICATION Take 2 tablets by mouth daily. Lions mane   Sodium Chloride, Hypertonic, (MURO 128 OP) Place 1 drop into both eyes 2 (two) times daily as needed (dry eyes).    Turmeric, Curcuma Longa, (CURCUMIN) POWD by Does not apply route.          Past Medical History:  Diagnosis Date   ADD (attention deficit disorder)    Allergy    Anemia    as a teenager   Arthritis    Complication of anesthesia    slow to wake up after colonoscopy   Edema    Hearing loss    History of hiatal hernia    hx of   Hypothyroidism    hx of as child and during pregnancy   Neuromuscular disorder (HCC)    Nocturia    Pneumonia    hx of after bronchitis   PONV (postoperative nausea and vomiting)    Thyroid disease   Hands feet swelling     Objective:    wts   04/24/2023       121 10/08/2022       127  12/11/2020       136  06/13/2020     136  09/21/2019         140  04/15/19 138 lb (62.6 kg)  04/08/19 137 lb (62.1 kg)  03/15/19 136 lb (61.7 kg)    Vital signs reviewed  04/24/2023  - Note at rest 02 sats  98% on RA   General appearance:    amb wf nad    HEENT : Oropharynx  clear      Nasal turbinates watery secretions only    NECK :  without  apparent JVD/ palpable Nodes/TM    LUNGS: no acc muscle use,  Nl contour chest which is clear to A and P bilaterally without cough on insp or exp maneuvers   CV:  RRR  no s3 or murmur or increase in P2, and no edema   ABD:  soft and nontender with nl inspiratory excursion in the supine position. No bruits or organomegaly appreciated   MS:  Nl gait/ ext warm without deformities Or obvious joint restrictions  calf tenderness, cyanosis or clubbing    SKIN: warm and dry without lesions    NEURO:  alert, approp, nl sensorium with  no motor or cerebellar deficits apparent.          Assessment

## 2023-04-24 NOTE — Assessment & Plan Note (Addendum)
Onset   ? around 2017  - see CT chest 01/12/2019 - Labs ordered 03/02/2019     alpha one AT phenotype  MM level 147 - Allergy profile 03/02/19 >  Eos 0.1  /  IgE  13 RAST neg  - Ig profile: 03/02/2019   Minimal decrease IgM and IgG - 04/15/2019 added prn cycles of zpak for flares  - PFT's  09/21/2019  FEV1 2.22 (128 % ) ratio 0.75  p 4 % improvement from saba p nothing prior to study    Adequate control on present rx, reviewed in detail with pt > no change in rx needed    Main concern is rhinitis so 1st try ICS and then add atrovent NS prn and if no better> ENT eval   F/u q 12 m, sooner prn          Each maintenance medication was reviewed in detail including emphasizing most importantly the difference between maintenance and prns and under what circumstances the prns are to be triggered using an action plan format where appropriate.  Total time for H and P, chart review, counseling, reviewing nsal device(s) and generating customized AVS unique to this office visit / same day charting = 28 min

## 2023-04-24 NOTE — Patient Instructions (Signed)
Nasacort AQ  1 or 2 puffs at night each nostril   We can call in Atrovent nasal spray instead   Please schedule a follow up visit in 12  months but call sooner if needed

## 2023-04-27 LAB — CUP PACEART REMOTE DEVICE CHECK
Date Time Interrogation Session: 20241005230652
Implantable Pulse Generator Implant Date: 20231115

## 2023-04-28 ENCOUNTER — Ambulatory Visit: Payer: Medicare HMO

## 2023-04-28 DIAGNOSIS — G459 Transient cerebral ischemic attack, unspecified: Secondary | ICD-10-CM | POA: Diagnosis not present

## 2023-05-01 DIAGNOSIS — M5416 Radiculopathy, lumbar region: Secondary | ICD-10-CM | POA: Diagnosis not present

## 2023-05-08 DIAGNOSIS — M5416 Radiculopathy, lumbar region: Secondary | ICD-10-CM | POA: Diagnosis not present

## 2023-05-14 NOTE — Progress Notes (Signed)
[facility] Summary Report / Loop Recorder 

## 2023-05-19 DIAGNOSIS — M545 Low back pain, unspecified: Secondary | ICD-10-CM | POA: Diagnosis not present

## 2023-05-21 DIAGNOSIS — M545 Low back pain, unspecified: Secondary | ICD-10-CM | POA: Diagnosis not present

## 2023-05-27 DIAGNOSIS — M545 Low back pain, unspecified: Secondary | ICD-10-CM | POA: Diagnosis not present

## 2023-05-27 NOTE — Addendum Note (Signed)
Addended by: Tonny Bollman on: 05/27/2023 03:57 PM   Modules accepted: Orders

## 2023-05-29 DIAGNOSIS — M5416 Radiculopathy, lumbar region: Secondary | ICD-10-CM | POA: Diagnosis not present

## 2023-06-02 ENCOUNTER — Ambulatory Visit (INDEPENDENT_AMBULATORY_CARE_PROVIDER_SITE_OTHER): Payer: Medicare HMO

## 2023-06-02 DIAGNOSIS — G459 Transient cerebral ischemic attack, unspecified: Secondary | ICD-10-CM | POA: Diagnosis not present

## 2023-06-03 LAB — CUP PACEART REMOTE DEVICE CHECK
Date Time Interrogation Session: 20241110231708
Implantable Pulse Generator Implant Date: 20231115

## 2023-06-07 ENCOUNTER — Emergency Department (HOSPITAL_COMMUNITY)
Admission: EM | Admit: 2023-06-07 | Discharge: 2023-06-07 | Disposition: A | Discharge status: Home / Self Care | Payer: Medicare HMO | Attending: Emergency Medicine | Admitting: Emergency Medicine

## 2023-06-07 ENCOUNTER — Encounter (HOSPITAL_COMMUNITY): Payer: Self-pay | Admitting: Emergency Medicine

## 2023-06-07 ENCOUNTER — Other Ambulatory Visit: Payer: Self-pay

## 2023-06-07 DIAGNOSIS — Z7982 Long term (current) use of aspirin: Secondary | ICD-10-CM | POA: Diagnosis not present

## 2023-06-07 DIAGNOSIS — N3001 Acute cystitis with hematuria: Secondary | ICD-10-CM | POA: Diagnosis not present

## 2023-06-07 DIAGNOSIS — R102 Pelvic and perineal pain: Secondary | ICD-10-CM | POA: Diagnosis not present

## 2023-06-07 DIAGNOSIS — M545 Low back pain, unspecified: Secondary | ICD-10-CM | POA: Insufficient documentation

## 2023-06-07 DIAGNOSIS — I2510 Atherosclerotic heart disease of native coronary artery without angina pectoris: Secondary | ICD-10-CM | POA: Insufficient documentation

## 2023-06-07 LAB — URINALYSIS, ROUTINE W REFLEX MICROSCOPIC
Bacteria, UA: NONE SEEN
Bilirubin Urine: NEGATIVE
Glucose, UA: NEGATIVE mg/dL
Ketones, ur: 5 mg/dL — AB
Leukocytes,Ua: NEGATIVE
Nitrite: NEGATIVE
Protein, ur: NEGATIVE mg/dL
Specific Gravity, Urine: 1.013 (ref 1.005–1.030)
pH: 5 (ref 5.0–8.0)

## 2023-06-07 LAB — CBC
HCT: 36.1 % (ref 36.0–46.0)
Hemoglobin: 11.9 g/dL — ABNORMAL LOW (ref 12.0–15.0)
MCH: 31.3 pg (ref 26.0–34.0)
MCHC: 33 g/dL (ref 30.0–36.0)
MCV: 95 fL (ref 80.0–100.0)
Platelets: 208 10*3/uL (ref 150–400)
RBC: 3.8 MIL/uL — ABNORMAL LOW (ref 3.87–5.11)
RDW: 13.8 % (ref 11.5–15.5)
WBC: 4.9 10*3/uL (ref 4.0–10.5)
nRBC: 0 % (ref 0.0–0.2)

## 2023-06-07 LAB — COMPREHENSIVE METABOLIC PANEL
ALT: 22 U/L (ref 0–44)
AST: 32 U/L (ref 15–41)
Albumin: 3.7 g/dL (ref 3.5–5.0)
Alkaline Phosphatase: 57 U/L (ref 38–126)
Anion gap: 9 (ref 5–15)
BUN: 12 mg/dL (ref 8–23)
CO2: 21 mmol/L — ABNORMAL LOW (ref 22–32)
Calcium: 9 mg/dL (ref 8.9–10.3)
Chloride: 104 mmol/L (ref 98–111)
Creatinine, Ser: 0.7 mg/dL (ref 0.44–1.00)
GFR, Estimated: >60 mL/min (ref >60)
Glucose, Bld: 95 mg/dL (ref 70–99)
Potassium: 4.4 mmol/L (ref 3.5–5.1)
Sodium: 134 mmol/L — ABNORMAL LOW (ref 135–145)
Total Bilirubin: 0.9 mg/dL (ref <1.2)
Total Protein: 5.7 g/dL — ABNORMAL LOW (ref 6.5–8.1)

## 2023-06-07 LAB — LIPASE, BLOOD: Lipase: 41 U/L (ref 11–51)

## 2023-06-07 MED ORDER — CEFTRIAXONE SODIUM 1 G IJ SOLR
1.0000 g | Freq: Once | INTRAMUSCULAR | Status: AC
  Administered 2023-06-07: 1 g via INTRAMUSCULAR
  Filled 2023-06-07: qty 10

## 2023-06-07 MED ORDER — LIDOCAINE HCL (PF) 1 % IJ SOLN
INTRAMUSCULAR | Status: AC
  Filled 2023-06-07: qty 30

## 2023-06-07 MED ORDER — CEPHALEXIN 500 MG PO CAPS: 500.0000 mg | ORAL_CAPSULE | Freq: Four times a day (QID) | ORAL | 0 refills | Status: DC

## 2023-06-07 NOTE — ED Provider Notes (Signed)
[facility] EMERGENCY DEPARTMENT AT [facility] Provider Note   CSN: [id number] Arrival date & time: 06/07/23  1555     History {Add pertinent medical, surgical, social history, OB history to HPI:1} Chief Complaint  Patient presents with   Back Pain   Vaginal Pain    Cheryl Martin is a 89 y.o. female.  Patient has a history of a stroke and coronary artery disease.  She complains of dysuria and back pain   Back Pain Vaginal Pain       Home Medications Prior to Admission medications   Medication Sig Start Date End Date Taking? Authorizing Provider  cephALEXin (KEFLEX) 500 MG capsule Take 1 capsule (500 mg total) by mouth 4 (four) times daily. 06/07/23  Yes Bethann Berkshire, MD  aspirin EC (ASPIRIN LOW DOSE) 81 MG tablet Take 1 tablet (81 mg total) by mouth daily. Swallow whole. 10/18/22   Clovis Riley, PA-C  Bioflavonoid Products (VITAMIN C) CHEW Chew 1 tablet by mouth daily.    [provider]  BIOTIN PO Take 1 tablet by mouth daily.    [provider]  Cholecalciferol (VITAMIN D-3) 25 MCG (1000 UT) CAPS Take 1,000 Units by mouth daily with breakfast.    [provider]  Cyanocobalamin (B-12) 1000 MCG SUBL Place 2,000 mcg under the tongue daily.    [provider]  donepezil (ARICEPT) 5 MG tablet Take 1 tablet (5 mg total) by mouth at bedtime. 01/01/23   Windell Norfolk, MD  ezetimibe (ZETIA) 10 MG tablet Take 1 tablet (10 mg total) by mouth daily. 01/30/23   Orbie Pyo, MD  Multiple Vitamin (MULTIVITAMIN WITH MINERALS) TABS tablet Take 1 tablet by mouth daily.    [provider]  nitroGLYCERIN (NITROSTAT) 0.4 MG SL tablet Place 1 tablet (0.4 mg total) under the tongue every 5 (five) minutes as needed for chest pain. 10/22/18 01/30/23  Lars Masson, MD  Omega 3-6-9 Fatty Acids (OMEGA 3-6-9 PO) Take by mouth.    [provider]  OVER THE COUNTER MEDICATION Take 2 tablets by mouth daily. Lions mane     [provider]  Sodium Chloride, Hypertonic, (MURO 128 OP) Place 1 drop into both eyes 2 (two) times daily as needed (dry eyes).    [provider]  Turmeric, Corwin Levins, (CURCUMIN) POWD by Does not apply route.    [provider]      Allergies    Levaquin [levofloxacin], Oxybutynin, Tape, Beef-derived products, Flexeril [cyclobenzaprine], Mold extract [trichophyton], and Pollen extract    Review of Systems   Review of Systems  Genitourinary:  Positive for vaginal pain.  Musculoskeletal:  Positive for back pain.    Physical Exam Updated Vital Signs BP (!) 154/67 (BP Location: Right Arm)   Pulse 65   Temp 97.9 F (36.6 C) (Oral)   Resp 15   Ht 5\' 3"  (1.6 m)   Wt 54 kg   SpO2 100%   BMI 21.08 kg/m  Physical Exam  ED Results / Procedures / Treatments   Labs (all labs ordered are listed, but only abnormal results are displayed) Labs Reviewed  COMPREHENSIVE METABOLIC PANEL - Abnormal; Notable for the following components:      Result Value   Sodium 134 (*)    CO2 21 (*)    Total Protein 5.7 (*)    All other components within normal limits  CBC - Abnormal; Notable for the following components:   RBC 3.80 (*)  Hemoglobin 11.9 (*)    All other components within normal limits  URINALYSIS, ROUTINE W REFLEX MICROSCOPIC - Abnormal; Notable for the following components:   Hgb urine dipstick MODERATE (*)    Ketones, ur 5 (*)    All other components within normal limits  URINE CULTURE  LIPASE, BLOOD    EKG None  Radiology No results found.  Procedures Procedures  {Document cardiac monitor, telemetry assessment procedure when appropriate:1}  Medications Ordered in ED Medications  cefTRIAXone (ROCEPHIN) injection 1 g (1 g Intramuscular Given 06/07/23 1733)  lidocaine (PF) (XYLOCAINE) 1 % injection (  Given 06/07/23 1743)    ED Course/ Medical Decision Making/ A&P   {   Click here for ABCD2, HEART and other calculatorsREFRESH  Note before signing :1}                              Medical Decision Making Amount and/or Complexity of Data Reviewed Labs: ordered.  Risk Prescription drug management.  Patient with urinary tract infection.  She is given a shot of Rocephin and placed on Keflex and will follow-up with PCP  {Document critical care time when appropriate:1} {Document review of labs and clinical decision tools ie heart score, Chads2Vasc2 etc:1}  {Document your independent review of radiology images, and any outside records:1} {Document your discussion with family members, caretakers, and with consultants:1} {Document social determinants of health affecting pt's care:1} {Document your decision making why or why not admission, treatments were needed:1} Final Clinical Impression(s) / ED Diagnoses Final diagnoses:  Acute cystitis with hematuria    Rx / DC Orders ED Discharge Orders          Ordered    cephALEXin (KEFLEX) 500 MG capsule  4 times daily        06/07/23 1826

## 2023-06-07 NOTE — ED Triage Notes (Signed)
Pt here for lower back pain and vaginal pain, states that she has vaginal soreness that is worse with urination. States that she feels she has a UTI

## 2023-06-07 NOTE — Discharge Instructions (Signed)
Follow-up with your doctor in 4-5 days for recheck

## 2023-06-08 LAB — URINE CULTURE: Culture: <10000 — AB

## 2023-06-12 ENCOUNTER — Telehealth: Payer: Self-pay

## 2023-06-12 ENCOUNTER — Encounter: Payer: Medicare HMO | Admitting: Urology

## 2023-06-12 NOTE — Telephone Encounter (Signed)
Yes, that antibiotic is appropriate

## 2023-06-12 NOTE — Telephone Encounter (Signed)
Pt calling to let you know that she went to the ER and was dx with  Acute cystitis with hematuria pt says that she was given cephALEXin (KEFLEX) 500 MG capsule and said that she is still feeling weak and wanted to know if that medication was the best option for her.

## 2023-06-23 ENCOUNTER — Encounter: Payer: Self-pay | Admitting: Urology

## 2023-06-23 ENCOUNTER — Ambulatory Visit: Payer: Medicare HMO | Admitting: Urology

## 2023-06-23 VITALS — BP 122/69 | HR 72 | Ht 63.0 in | Wt 116.0 lb

## 2023-06-23 DIAGNOSIS — R3129 Other microscopic hematuria: Secondary | ICD-10-CM

## 2023-06-23 DIAGNOSIS — N3941 Urge incontinence: Secondary | ICD-10-CM | POA: Diagnosis not present

## 2023-06-23 DIAGNOSIS — N3281 Overactive bladder: Secondary | ICD-10-CM

## 2023-06-23 DIAGNOSIS — R8290 Unspecified abnormal findings in urine: Secondary | ICD-10-CM

## 2023-06-23 LAB — URINALYSIS, ROUTINE W REFLEX MICROSCOPIC
Bilirubin, UA: NEGATIVE
Glucose, UA: NEGATIVE
Leukocytes,UA: NEGATIVE
Nitrite, UA: NEGATIVE
Specific Gravity, UA: 1.025 (ref 1.005–1.030)
Urobilinogen, Ur: 0.2 mg/dL (ref 0.2–1.0)
pH, UA: 5.5 (ref 5.0–7.5)

## 2023-06-23 LAB — MICROSCOPIC EXAMINATION
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
## 2023-06-25 ENCOUNTER — Telehealth: Payer: Self-pay | Admitting: Urology

## 2023-06-25 NOTE — Telephone Encounter (Signed)
LVM. Pt would like someone to go over her urine results with her. Wants to know if she will be needing any medications as well.

## 2023-06-26 ENCOUNTER — Other Ambulatory Visit: Payer: Self-pay | Admitting: Urology

## 2023-06-26 ENCOUNTER — Telehealth: Payer: Self-pay

## 2023-06-26 DIAGNOSIS — R8290 Unspecified abnormal findings in urine: Secondary | ICD-10-CM

## 2023-06-26 MED ORDER — AMOXICILLIN-POT CLAVULANATE 500-125 MG PO TABS: 1.0000 | ORAL_TABLET | Freq: Two times a day (BID) | ORAL | 0 refills | Status: AC

## 2023-06-26 NOTE — Telephone Encounter (Signed)
-----   Message from [place] sent at 06/26/2023  1:11 PM EST ----- Please notify patient to begin Augmentin twice daily x 5 days for treatment of UTI found on the recent urine culture.  Prescription sent.

## 2023-06-26 NOTE — Telephone Encounter (Signed)
Per DPR, left detailed message on VM. Advised pt to call back tot he office with any questions/concerns.

## 2023-06-27 ENCOUNTER — Telehealth: Payer: Self-pay | Admitting: Urology

## 2023-06-27 NOTE — Telephone Encounter (Signed)
Went over instructions that were left on VM. Pt aware of the need for antibiotics and follow up in January. Pt verbalized understanding.

## 2023-06-27 NOTE — Telephone Encounter (Signed)
Pt returned your call regarding results.  °

## 2023-06-27 NOTE — Progress Notes (Signed)
[facility] Summary Report / Loop Recorder 

## 2023-06-30 ENCOUNTER — Emergency Department (HOSPITAL_COMMUNITY)
Admission: EM | Admit: 2023-06-30 | Discharge: 2023-06-30 | Disposition: A | Discharge status: Home / Self Care | Payer: Medicare HMO | Attending: Emergency Medicine | Admitting: Emergency Medicine

## 2023-06-30 ENCOUNTER — Other Ambulatory Visit: Payer: Self-pay

## 2023-06-30 ENCOUNTER — Emergency Department (HOSPITAL_COMMUNITY): Payer: Medicare HMO

## 2023-06-30 ENCOUNTER — Encounter (HOSPITAL_COMMUNITY): Payer: Self-pay

## 2023-06-30 DIAGNOSIS — M51369 Other intervertebral disc degeneration, lumbar region without mention of lumbar back pain or lower extremity pain: Secondary | ICD-10-CM | POA: Diagnosis not present

## 2023-06-30 DIAGNOSIS — M546 Pain in thoracic spine: Secondary | ICD-10-CM | POA: Diagnosis not present

## 2023-06-30 DIAGNOSIS — Z7982 Long term (current) use of aspirin: Secondary | ICD-10-CM | POA: Insufficient documentation

## 2023-06-30 DIAGNOSIS — K449 Diaphragmatic hernia without obstruction or gangrene: Secondary | ICD-10-CM | POA: Diagnosis not present

## 2023-06-30 DIAGNOSIS — I700 Atherosclerosis of aorta: Secondary | ICD-10-CM | POA: Diagnosis not present

## 2023-06-30 DIAGNOSIS — M5134 Other intervertebral disc degeneration, thoracic region: Secondary | ICD-10-CM | POA: Diagnosis not present

## 2023-06-30 DIAGNOSIS — R079 Chest pain, unspecified: Secondary | ICD-10-CM | POA: Diagnosis not present

## 2023-06-30 DIAGNOSIS — M4186 Other forms of scoliosis, lumbar region: Secondary | ICD-10-CM | POA: Diagnosis not present

## 2023-06-30 DIAGNOSIS — J984 Other disorders of lung: Secondary | ICD-10-CM | POA: Diagnosis not present

## 2023-06-30 DIAGNOSIS — M48061 Spinal stenosis, lumbar region without neurogenic claudication: Secondary | ICD-10-CM | POA: Diagnosis not present

## 2023-06-30 LAB — TROPONIN I (HIGH SENSITIVITY): Troponin I (High Sensitivity): 3 ng/L (ref <18)

## 2023-06-30 LAB — CBC
HCT: 39.1 % (ref 36.0–46.0)
Hemoglobin: 13 g/dL (ref 12.0–15.0)
MCH: 31.5 pg (ref 26.0–34.0)
MCHC: 33.2 g/dL (ref 30.0–36.0)
MCV: 94.7 fL (ref 80.0–100.0)
Platelets: 246 10*3/uL (ref 150–400)
RBC: 4.13 MIL/uL (ref 3.87–5.11)
RDW: 12.9 % (ref 11.5–15.5)
WBC: 5.6 10*3/uL (ref 4.0–10.5)
nRBC: 0 % (ref 0.0–0.2)

## 2023-06-30 LAB — BASIC METABOLIC PANEL
Anion gap: 8 (ref 5–15)
BUN: 14 mg/dL (ref 8–23)
CO2: 24 mmol/L (ref 22–32)
Calcium: 9.5 mg/dL (ref 8.9–10.3)
Chloride: 103 mmol/L (ref 98–111)
Creatinine, Ser: 0.94 mg/dL (ref 0.44–1.00)
GFR, Estimated: 58 mL/min — ABNORMAL LOW (ref >60)
Glucose, Bld: 101 mg/dL — ABNORMAL HIGH (ref 70–99)
Potassium: 4.1 mmol/L (ref 3.5–5.1)
Sodium: 135 mmol/L (ref 135–145)

## 2023-06-30 MED ORDER — OXYCODONE-ACETAMINOPHEN 5-325 MG PO TABS
1.0000 | ORAL_TABLET | Freq: Once | ORAL | Status: AC
  Administered 2023-06-30: 1 via ORAL
  Filled 2023-06-30: qty 1

## 2023-06-30 MED ORDER — OXYCODONE HCL 5 MG PO TABS
5.0000 mg | ORAL_TABLET | Freq: Once | ORAL | Status: AC
  Administered 2023-06-30: 5 mg via ORAL
  Filled 2023-06-30: qty 1

## 2023-06-30 MED ORDER — ACETAMINOPHEN 500 MG PO TABS: 1000.0000 mg | ORAL_TABLET | Freq: Three times a day (TID) | ORAL | 0 refills | Status: AC | PRN

## 2023-06-30 MED ORDER — OXYCODONE HCL 5 MG PO TABS: 2.5000 mg | ORAL_TABLET | Freq: Four times a day (QID) | ORAL | 0 refills | Status: DC | PRN

## 2023-06-30 MED ORDER — LIDOCAINE 5 % EX PTCH: 1.0000 | MEDICATED_PATCH | CUTANEOUS | 0 refills | Status: DC

## 2023-06-30 NOTE — ED Triage Notes (Addendum)
Pt c/o pain in upper/middle back that radiates into chest that started 2 days ago. Pt denies injury. Pt denies numbness or tingling. Pt has shortness of breath.

## 2023-06-30 NOTE — ED Provider Notes (Signed)
EMERGENCY DEPARTMENT AT [facility] Provider Note   CSN: [id number] Arrival date & time: 06/30/23  1012     History  Chief Complaint  Patient presents with   Back Pain    Cheryl Martin is a 89 y.o. female.  Patient presents with fairly persistent upper/thoracic back pain in the middle with mild radiation to the chest.  Patient denies any weakness numbness or tingling in arms or legs.  Worse with movement.  No history of known compression fracture, bony pathology or cancer.  No falls.  Normal bowel and bladder function.  No fevers.  The history is provided by the patient.  Back Pain Associated symptoms: no abdominal pain, no chest pain, no dysuria, no fever and no headaches        Home Medications Prior to Admission medications   Medication Sig Start Date End Date Taking? Authorizing Provider  amoxicillin-clavulanate (AUGMENTIN) 500-125 MG tablet Take 1 tablet by mouth in the morning and at bedtime for 5 days. 06/26/23 07/01/23  Stoneking, Danford Bad., MD  aspirin EC (ASPIRIN LOW DOSE) 81 MG tablet Take 1 tablet (81 mg total) by mouth daily. Swallow whole. 10/18/22   Clovis Riley, PA-C  Bioflavonoid Products (VITAMIN C) CHEW Chew 1 tablet by mouth daily.    [provider]  BIOTIN PO Take 1 tablet by mouth daily.    [provider]  cephALEXin (KEFLEX) 500 MG capsule Take 1 capsule (500 mg total) by mouth 4 (four) times daily. 06/07/23   Bethann Berkshire, MD  Cholecalciferol (VITAMIN D-3) 25 MCG (1000 UT) CAPS Take 1,000 Units by mouth daily with breakfast.    [provider]  Cyanocobalamin (B-12) 1000 MCG SUBL Place 2,000 mcg under the tongue daily.    [provider]  donepezil (ARICEPT) 5 MG tablet Take 1 tablet (5 mg total) by mouth at bedtime. 01/01/23   Windell Norfolk, MD  ezetimibe (ZETIA) 10 MG tablet Take 1 tablet (10 mg total) by mouth daily. 01/30/23   Orbie Pyo, MD  Multiple Vitamin (MULTIVITAMIN  WITH MINERALS) TABS tablet Take 1 tablet by mouth daily.    [provider]  nitroGLYCERIN (NITROSTAT) 0.4 MG SL tablet Place 1 tablet (0.4 mg total) under the tongue every 5 (five) minutes as needed for chest pain. 10/22/18 01/30/23  Lars Masson, MD  Omega 3-6-9 Fatty Acids (OMEGA 3-6-9 PO) Take by mouth.    [provider]  OVER THE COUNTER MEDICATION Take 2 tablets by mouth daily. Lions mane    [provider]  Sodium Chloride, Hypertonic, (MURO 128 OP) Place 1 drop into both eyes 2 (two) times daily as needed (dry eyes).    [provider]  Turmeric, Corwin Levins, (CURCUMIN) POWD by Does not apply route.    [provider]      Allergies    Levaquin [levofloxacin], Oxybutynin, Tape, Beef-derived drug products, Flexeril [cyclobenzaprine], Mold extract [trichophyton], and Pollen extract    Review of Systems   Review of Systems  Constitutional:  Negative for chills and fever.  HENT:  Negative for congestion.   Eyes:  Negative for visual disturbance.  Respiratory:  Negative for shortness of breath.   Cardiovascular:  Negative for chest pain.  Gastrointestinal:  Negative for abdominal pain and vomiting.  Genitourinary:  Negative for dysuria and flank pain.  Musculoskeletal:  Positive for back pain. Negative for neck pain and neck stiffness.  Skin:  Negative for rash.  Neurological:  Negative  for light-headedness and headaches.    Physical Exam Updated Vital Signs BP (!) 145/69   Pulse 69   Temp 97.8 F (36.6 C) (Oral)   Resp 16   Ht 5\' 3"  (1.6 m)   Wt 52.6 kg   SpO2 97%   BMI 20.55 kg/m  Physical Exam Vitals and nursing note reviewed.  Constitutional:      General: She is not in acute distress.    Appearance: She is well-developed.  HENT:     Head: Normocephalic and atraumatic.     Mouth/Throat:     Mouth: Mucous membranes are moist.  Eyes:     General:        Right eye: No discharge.        Left eye: No discharge.      Conjunctiva/sclera: Conjunctivae normal.  Neck:     Trachea: No tracheal deviation.  Cardiovascular:     Rate and Rhythm: Normal rate and regular rhythm.  Pulmonary:     Effort: Pulmonary effort is normal.     Breath sounds: Normal breath sounds.  Abdominal:     General: There is no distension.     Palpations: Abdomen is soft.     Tenderness: There is no abdominal tenderness. There is no guarding.  Musculoskeletal:        General: Tenderness present. No swelling.     Cervical back: Normal range of motion and neck supple. No rigidity.     Comments: Patient has tenderness midline and paraspinal mid thoracic region without rash or step-off, mild scoliosis.  Patient is normal strength and sensation all extremities bilateral flexion extension major joints in the lower extremities.  Normal lower extremity reflexes.  Skin:    General: Skin is warm.     Capillary Refill: Capillary refill takes less than 2 seconds.     Findings: No rash.  Neurological:     General: No focal deficit present.     Mental Status: She is alert.     Cranial Nerves: No cranial nerve deficit.  Psychiatric:        Mood and Affect: Mood normal.     ED Results / Procedures / Treatments   Labs (all labs ordered are listed, but only abnormal results are displayed) Labs Reviewed  BASIC METABOLIC PANEL - Abnormal; Notable for the following components:      Result Value   Glucose, Bld 101 (*)    GFR, Estimated 58 (*)    All other components within normal limits  CBC  TROPONIN I (HIGH SENSITIVITY)    EKG None  Radiology DG Chest 2 View  Result Date: 06/30/2023 CLINICAL DATA:  Chest with mid to upper back pain for 2 days. No known injury. Shortness of breath. EXAM: CHEST - 2 VIEW COMPARISON:  Radiographs 10/08/2022 and 02/09/2021.  CT 12/17/2019. FINDINGS: The heart size and mediastinal contours are stable with aortic atherosclerosis and a small hiatal hernia. Grossly stable chronic lung disease with biapical  scarring and scattered nodularity. No confluent airspace disease, pleural effusion or pneumothorax. Loop recorder present in the left anterior chest wall. There are degenerative changes in the spine associated with a moderate thoracolumbar scoliosis. IMPRESSION: No evidence of acute cardiopulmonary process. Chronic lung disease. Electronically Signed   By: Carey Bullocks M.D.   On: 06/30/2023 12:20    Procedures Procedures    Medications Ordered in ED Medications  oxyCODONE-acetaminophen (PERCOCET/ROXICET) 5-325 MG per tablet 1 tablet (1 tablet Oral Given 06/30/23 1314)    ED  Course/ Medical Decision Making/ A&P                                 Medical Decision Making Amount and/or Complexity of Data Reviewed Labs: ordered. Radiology: ordered.  Risk Prescription drug management.   Patient presents with moderate thoracic back pain most consistent with musculoskeletal since primarily movement related.  Patient has no red flags in terms of cancer history, fevers, trauma, abnormal neurologic exam.  Given age discussed possibly compression fracture.  Patient has no abdominal or chest pain.  Unlikely dissection given specifically worse with movement.  Blood work independently reviewed reassuring normal white count, normal hemoglobin, electrolytes unremarkable.  Plan for CT scan thoracic and lumbar for further delineation.  Oxycodone ordered for pain.  Patient care signed out to follow-up CT scan results and reassess for final disposition.        Final Clinical Impression(s) / ED Diagnoses Final diagnoses:  Acute bilateral thoracic back pain    Rx / DC Orders ED Discharge Orders     None         Blane Ohara, MD 06/30/23 1534

## 2023-06-30 NOTE — ED Provider Notes (Signed)
Care of patient received from prior provider at 5:20 PM, please see their note for complete H/P and care plan.  Received handoff per ED course.  Clinical Course as of 06/30/23 1720  Mon Jun 30, 2023  1546 Stable  56 YOF with 3 days of acute on chronic back pain.  CT L/T pending. Mid thoracic. [CC]    Clinical Course User Index [CC] Glyn Ade, MD    Reassessment: On my reassessment she is in no acute distress after treatment by prior provider. Had long conversation regarding supportive care for her therapy.  She will proceed with low dose of pain medication due to severity of symptoms and will follow-up with her normal spine provider.  Used to require trigger point injections for similar symptoms. States that she will give them a call for long-term definitive management. Discussed risk of beers criteria positive medications and patient expressed understanding of risk but due to severity of symptoms with still like to proceed with these therapies.     Glyn Ade, MD 06/30/23 1721

## 2023-06-30 NOTE — ED Notes (Signed)
This RN reviewed discharge instructions with patient. She verbalized understanding and denied any further questions. PT well appearing upon discharge and reports tolerable pain. Pt ambulated with stable gait to exit. Pt endorses ride home with family

## 2023-07-01 ENCOUNTER — Telehealth: Payer: Self-pay | Admitting: *Deleted

## 2023-07-01 DIAGNOSIS — M549 Dorsalgia, unspecified: Secondary | ICD-10-CM

## 2023-07-01 MED ORDER — LIDOCAINE 5 % EX PTCH: 1.0000 | MEDICATED_PATCH | CUTANEOUS | 0 refills | Status: DC

## 2023-07-01 NOTE — Telephone Encounter (Signed)
Copied from CRM [id number]. Topic: Clinical - Prescription Issue >> Jul 01, 2023 10:32 AM Orinda Kenner C wrote: Reason for CRM: [facility] Marcelle Smiling [phone] requres a dx code ICD-10 for lidocaine (LIDODERM) 5 % patches

## 2023-07-01 NOTE — Telephone Encounter (Signed)
PA has been initiated.

## 2023-07-01 NOTE — Addendum Note (Signed)
Addended by: Saralyn Pilar on: 07/01/2023 02:26 PM   Modules accepted: Orders

## 2023-07-01 NOTE — Telephone Encounter (Signed)
Copied from CRM [id number]. Topic: Clinical - Prescription Issue >> Jul 01, 2023  8:40 AM Prudencio Pair wrote: Reason for CRM: Patient states she was at the ER yesterday and they prescribed her some lidocaine patches and [facility]. Patient states the pharmacy advised her that her PCP, Dr. Jairo Ben, will need to give a preauthorization before they can fill those. Patient states her daughter is on the way to the pharmacy to pick those up now and would like for the provider to go ahead and get this taken care of, ASAP. Advised pt that this may not be done right away but we will advise when this has been sent to pharmacy. Please give pt a call once this has been taken care of. Pt's CB #: [phone].

## 2023-07-01 NOTE — Telephone Encounter (Signed)
Patient's plan alternative [facility]

## 2023-07-01 NOTE — Telephone Encounter (Signed)
Lidocaine (Lidoderm) patch was denied. Tridacaine pad 5% is covered by patient's plan.

## 2023-07-07 ENCOUNTER — Ambulatory Visit (INDEPENDENT_AMBULATORY_CARE_PROVIDER_SITE_OTHER): Payer: Medicare HMO

## 2023-07-07 DIAGNOSIS — G459 Transient cerebral ischemic attack, unspecified: Secondary | ICD-10-CM | POA: Diagnosis not present

## 2023-07-07 LAB — CUP PACEART REMOTE DEVICE CHECK
Date Time Interrogation Session: 20241215231541
Implantable Pulse Generator Implant Date: 20231115

## 2023-07-08 ENCOUNTER — Ambulatory Visit: Payer: Medicare HMO | Admitting: Neurology

## 2023-07-08 ENCOUNTER — Encounter: Payer: Self-pay | Admitting: Neurology

## 2023-07-08 VITALS — BP 138/61 | HR 80 | Ht 63.0 in | Wt 119.5 lb

## 2023-07-08 DIAGNOSIS — G309 Alzheimer's disease, unspecified: Secondary | ICD-10-CM | POA: Diagnosis not present

## 2023-07-08 DIAGNOSIS — G3184 Mild cognitive impairment, so stated: Secondary | ICD-10-CM
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
## 2023-07-08 NOTE — Patient Instructions (Signed)
Continue current medications  Continue to follow up with PCP  Consider therapy if still experiencing sadness over the loss of her dog  Return in a year or sooner if worse    There are well-accepted and sensible ways to reduce risk for Alzheimers disease and other degenerative brain disorders .  Exercise Daily Walk A daily 20 minute walk should be part of your routine. Disease related apathy can be a significant roadblock to exercise and the only way to overcome this is to make it a daily routine and perhaps have a reward at the end (something your loved one loves to eat or drink perhaps) or a personal trainer coming to the home can also be very useful. Most importantly, the patient is much more likely to exercise if the caregiver / spouse does it with him/her. In general a structured, repetitive schedule is best.  General Health: Any diseases which effect your body will effect your brain such as a pneumonia, urinary infection, blood clot, heart attack or stroke. Keep contact with your primary care doctor for regular follow ups.  Sleep. A good nights sleep is healthy for the brain. Seven hours is recommended. If you have insomnia or poor sleep habits we can give you some instructions. If you have sleep apnea wear your mask.  Diet: Eating a heart healthy diet is also a good idea; fish and poultry instead of red meat, nuts (mostly non-peanuts), vegetables, fruits, olive oil or canola oil (instead of butter), minimal salt (use other spices to flavor foods), whole grain rice, bread, cereal and pasta and wine in moderation.Research is now showing that the MIND diet, which is a combination of The Mediterranean diet and the DASH diet, is beneficial for cognitive processing and longevity. Information about this diet can be found in The MIND Diet, a book by Alonna Minium, MS, RDN, and online at [employer]  Finances, Power of [address] and Advance Directives: You should consider  putting legal safeguards in place with regard to financial and medical decision making. While the spouse always has power of attorney for medical and financial issues in the absence of any form, you should consider what you want in case the spouse / caregiver is no longer around or capable of making decisions.

## 2023-07-09 ENCOUNTER — Telehealth: Payer: Self-pay

## 2023-07-31 ENCOUNTER — Ambulatory Visit (INDEPENDENT_AMBULATORY_CARE_PROVIDER_SITE_OTHER): Payer: Medicare HMO | Admitting: Urology

## 2023-07-31 VITALS — BP 124/70 | HR 79 | Ht 66.0 in | Wt 114.0 lb

## 2023-07-31 DIAGNOSIS — R3129 Other microscopic hematuria: Secondary | ICD-10-CM

## 2023-07-31 DIAGNOSIS — N3941 Urge incontinence: Secondary | ICD-10-CM | POA: Diagnosis not present

## 2023-07-31 DIAGNOSIS — N3281 Overactive bladder: Secondary | ICD-10-CM | POA: Diagnosis not present

## 2023-07-31 MED ORDER — CEPHALEXIN 500 MG PO CAPS
500.0000 mg | ORAL_CAPSULE | Freq: Once | ORAL | Status: AC
  Administered 2023-07-31: 500 mg via ORAL

## 2023-07-31 MED ORDER — ONABOTULINUMTOXINA 100 UNITS IJ SOLR
100.0000 [IU] | Freq: Once | INTRAMUSCULAR | Status: AC
  Administered 2023-07-31: 100 [IU] via INTRAMUSCULAR
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
## 2023-07-31 NOTE — Progress Notes (Signed)
 Bladder Instillation  Due to OAB patient is present today for a Bladder Instillation of Lidocaine  2%. Patient was cleaned and prepped in a sterile fashion with betadine. A 14FR catheter was inserted, urine return was noted 30ml, urine was yellow in color.  50mL was instilled into the bladder. The catheter was then removed. Patient tolerated well, no complications were noted. Patient held in bladder for 30 minutes prior to procedure starting.   Performed by: Leslieann Whisman CMA

## 2023-08-01 LAB — URINALYSIS, ROUTINE W REFLEX MICROSCOPIC
Bilirubin, UA: NEGATIVE
Glucose, UA: NEGATIVE
Ketones, UA: NEGATIVE
Leukocytes,UA: NEGATIVE
Nitrite, UA: NEGATIVE
Specific Gravity, UA: 1.02 (ref 1.005–1.030)
Urobilinogen, Ur: 0.2 mg/dL (ref 0.2–1.0)
pH, UA: 6 (ref 5.0–7.5)

## 2023-08-01 LAB — MICROSCOPIC EXAMINATION

## 2023-08-06 ENCOUNTER — Emergency Department (HOSPITAL_COMMUNITY): Payer: Medicare HMO

## 2023-08-06 ENCOUNTER — Observation Stay (HOSPITAL_COMMUNITY): Payer: Medicare HMO

## 2023-08-06 ENCOUNTER — Encounter (HOSPITAL_COMMUNITY): Payer: Self-pay

## 2023-08-06 ENCOUNTER — Other Ambulatory Visit: Payer: Self-pay

## 2023-08-06 ENCOUNTER — Observation Stay (HOSPITAL_COMMUNITY)
Admission: EM | Admit: 2023-08-06 | Discharge: 2023-08-07 | Disposition: A | Discharge status: Home / Self Care | Payer: Medicare HMO | Attending: Family Medicine | Admitting: Family Medicine

## 2023-08-06 DIAGNOSIS — E785 Hyperlipidemia, unspecified: Secondary | ICD-10-CM | POA: Diagnosis not present

## 2023-08-06 DIAGNOSIS — Z7982 Long term (current) use of aspirin: Secondary | ICD-10-CM | POA: Insufficient documentation

## 2023-08-06 DIAGNOSIS — E039 Hypothyroidism, unspecified: Secondary | ICD-10-CM | POA: Diagnosis not present

## 2023-08-06 DIAGNOSIS — I639 Cerebral infarction, unspecified: Secondary | ICD-10-CM | POA: Diagnosis not present

## 2023-08-06 DIAGNOSIS — I700 Atherosclerosis of aorta: Secondary | ICD-10-CM | POA: Diagnosis not present

## 2023-08-06 DIAGNOSIS — I2510 Atherosclerotic heart disease of native coronary artery without angina pectoris: Secondary | ICD-10-CM | POA: Diagnosis not present

## 2023-08-06 DIAGNOSIS — R519 Headache, unspecified: Principal | ICD-10-CM

## 2023-08-06 DIAGNOSIS — Z96652 Presence of left artificial knee joint: Secondary | ICD-10-CM | POA: Insufficient documentation

## 2023-08-06 DIAGNOSIS — G459 Transient cerebral ischemic attack, unspecified: Principal | ICD-10-CM

## 2023-08-06 DIAGNOSIS — R9431 Abnormal electrocardiogram [ECG] [EKG]: Secondary | ICD-10-CM | POA: Diagnosis not present

## 2023-08-06 DIAGNOSIS — R4701 Aphasia: Secondary | ICD-10-CM | POA: Diagnosis not present

## 2023-08-06 DIAGNOSIS — Z87891 Personal history of nicotine dependence: Secondary | ICD-10-CM | POA: Insufficient documentation

## 2023-08-06 DIAGNOSIS — Z743 Need for continuous supervision: Secondary | ICD-10-CM | POA: Diagnosis not present

## 2023-08-06 DIAGNOSIS — I6502 Occlusion and stenosis of left vertebral artery: Secondary | ICD-10-CM | POA: Diagnosis not present

## 2023-08-06 DIAGNOSIS — R531 Weakness: Secondary | ICD-10-CM | POA: Diagnosis not present

## 2023-08-06 DIAGNOSIS — Z79899 Other long term (current) drug therapy: Secondary | ICD-10-CM | POA: Diagnosis not present

## 2023-08-06 DIAGNOSIS — I10 Essential (primary) hypertension: Secondary | ICD-10-CM | POA: Diagnosis not present

## 2023-08-06 DIAGNOSIS — Z471 Aftercare following joint replacement surgery: Secondary | ICD-10-CM | POA: Diagnosis not present

## 2023-08-06 DIAGNOSIS — Z8673 Personal history of transient ischemic attack (TIA), and cerebral infarction without residual deficits: Secondary | ICD-10-CM | POA: Diagnosis not present

## 2023-08-06 LAB — URINALYSIS, ROUTINE W REFLEX MICROSCOPIC
Bacteria, UA: NONE SEEN
Bilirubin Urine: NEGATIVE
Glucose, UA: NEGATIVE mg/dL
Ketones, ur: NEGATIVE mg/dL
Leukocytes,Ua: NEGATIVE
Nitrite: NEGATIVE
Protein, ur: NEGATIVE mg/dL
Specific Gravity, Urine: 1.011 (ref 1.005–1.030)
pH: 7 (ref 5.0–8.0)

## 2023-08-06 LAB — CBC
HCT: 36.9 % (ref 36.0–46.0)
HCT: 38.8 % (ref 36.0–46.0)
Hemoglobin: 12.3 g/dL (ref 12.0–15.0)
Hemoglobin: 13.4 g/dL (ref 12.0–15.0)
MCH: 31.8 pg (ref 26.0–34.0)
MCH: 31.8 pg (ref 26.0–34.0)
MCHC: 33.3 g/dL (ref 30.0–36.0)
MCHC: 34.5 g/dL (ref 30.0–36.0)
MCV: 95.3 fL (ref 80.0–100.0)
MCV: 92.2 fL (ref 80.0–100.0)
Platelets: 208 10*3/uL (ref 150–400)
Platelets: 266 10*3/uL (ref 150–400)
RBC: 3.87 MIL/uL (ref 3.87–5.11)
RBC: 4.21 MIL/uL (ref 3.87–5.11)
RDW: 12.6 % (ref 11.5–15.5)
RDW: 13 % (ref 11.5–15.5)
WBC: 5.3 10*3/uL (ref 4.0–10.5)
WBC: 5.6 10*3/uL (ref 4.0–10.5)
nRBC: 0 % (ref 0.0–0.2)
nRBC: 0 % (ref 0.0–0.2)

## 2023-08-06 LAB — DIFFERENTIAL
Abs Immature Granulocytes: 0.01 10*3/uL (ref 0.00–0.07)
Basophils Absolute: 0.1 10*3/uL (ref 0.0–0.1)
Basophils Relative: 1 %
Eosinophils Absolute: 0.3 10*3/uL (ref 0.0–0.5)
Eosinophils Relative: 5 %
Immature Granulocytes: 0 %
Lymphocytes Relative: 21 %
Lymphs Abs: 1.1 10*3/uL (ref 0.7–4.0)
Monocytes Absolute: 0.6 10*3/uL (ref 0.1–1.0)
Monocytes Relative: 11 %
Neutro Abs: 3.3 10*3/uL (ref 1.7–7.7)
Neutrophils Relative %: 62 %

## 2023-08-06 LAB — I-STAT CHEM 8, ED
BUN: 21 mg/dL (ref 8–23)
Calcium, Ion: 1.11 mmol/L — ABNORMAL LOW (ref 1.15–1.40)
Chloride: 103 mmol/L (ref 98–111)
Creatinine, Ser: 0.7 mg/dL (ref 0.44–1.00)
Glucose, Bld: 83 mg/dL (ref 70–99)
HCT: 36 % (ref 36.0–46.0)
Hemoglobin: 12.2 g/dL (ref 12.0–15.0)
Potassium: 4.5 mmol/L (ref 3.5–5.1)
Sodium: 134 mmol/L — ABNORMAL LOW (ref 135–145)
TCO2: 25 mmol/L (ref 22–32)

## 2023-08-06 LAB — SEDIMENTATION RATE: Sed Rate: 1 mm/h (ref 0–22)

## 2023-08-06 LAB — CREATININE, SERUM
Creatinine, Ser: 0.67 mg/dL (ref 0.44–1.00)
GFR, Estimated: >60 mL/min (ref >60)

## 2023-08-06 LAB — C-REACTIVE PROTEIN: CRP: <0.5 mg/dL (ref <1.0)

## 2023-08-06 LAB — COMPREHENSIVE METABOLIC PANEL
ALT: 15 U/L (ref 0–44)
AST: 32 U/L (ref 15–41)
Albumin: 3.6 g/dL (ref 3.5–5.0)
Alkaline Phosphatase: 61 U/L (ref 38–126)
Anion gap: 10 (ref 5–15)
BUN: 16 mg/dL (ref 8–23)
CO2: 22 mmol/L (ref 22–32)
Calcium: 9.1 mg/dL (ref 8.9–10.3)
Chloride: 103 mmol/L (ref 98–111)
Creatinine, Ser: 0.71 mg/dL (ref 0.44–1.00)
GFR, Estimated: >60 mL/min (ref >60)
Glucose, Bld: 90 mg/dL (ref 70–99)
Potassium: 4.6 mmol/L (ref 3.5–5.1)
Sodium: 135 mmol/L (ref 135–145)
Total Bilirubin: 1 mg/dL (ref 0.0–1.2)
Total Protein: 5.8 g/dL — ABNORMAL LOW (ref 6.5–8.1)

## 2023-08-06 LAB — ECHOCARDIOGRAM COMPLETE
Area-P 1/2: 2.73 cm2
Height: 66 in
S' Lateral: 2.8 cm
Weight: 1834.23 [oz_av]

## 2023-08-06 LAB — APTT: aPTT: 27 s (ref 24–36)

## 2023-08-06 LAB — CBG MONITORING, ED: Glucose-Capillary: 83 mg/dL (ref 70–99)

## 2023-08-06 LAB — ETHANOL: Alcohol, Ethyl (B): <10 mg/dL (ref <10)

## 2023-08-06 LAB — PROTIME-INR
INR: 1 (ref 0.8–1.2)
Prothrombin Time: 13.5 s (ref 11.4–15.2)

## 2023-08-06 MED ORDER — SODIUM CHLORIDE 0.9% FLUSH: 3.0000 mL | Freq: Two times a day (BID) | INTRAVENOUS | Status: DC

## 2023-08-06 MED ORDER — POLYETHYLENE GLYCOL 3350 17 G PO PACK: 17.0000 g | PACK | Freq: Every day | ORAL | Status: DC | PRN

## 2023-08-06 MED ORDER — KETOROLAC TROMETHAMINE 30 MG/ML IJ SOLN
15.0000 mg | Freq: Once | INTRAMUSCULAR | Status: AC
  Administered 2023-08-06: 15 mg via INTRAVENOUS
  Filled 2023-08-06: qty 1

## 2023-08-06 MED ORDER — ACETAMINOPHEN 325 MG PO TABS
650.0000 mg | ORAL_TABLET | Freq: Four times a day (QID) | ORAL | Status: DC | PRN
  Administered 2023-08-06: 650 mg via ORAL
  Filled 2023-08-06: qty 2

## 2023-08-06 MED ORDER — ACETAMINOPHEN 650 MG RE SUPP: 650.0000 mg | Freq: Four times a day (QID) | RECTAL | Status: DC | PRN

## 2023-08-06 MED ORDER — PROCHLORPERAZINE EDISYLATE 10 MG/2ML IJ SOLN
10.0000 mg | Freq: Once | INTRAMUSCULAR | Status: AC
  Administered 2023-08-06: 10 mg via INTRAVENOUS
  Filled 2023-08-06: qty 2

## 2023-08-06 MED ORDER — ENOXAPARIN SODIUM 40 MG/0.4ML IJ SOSY
40.0000 mg | PREFILLED_SYRINGE | INTRAMUSCULAR | Status: DC
  Administered 2023-08-07: 40 mg via SUBCUTANEOUS
  Filled 2023-08-06: qty 0.4

## 2023-08-06 MED ORDER — ACETAMINOPHEN 10 MG/ML IV SOLN
1000.0000 mg | Freq: Once | INTRAVENOUS | Status: AC
  Filled 2023-08-06: qty 100

## 2023-08-06 MED ORDER — SODIUM CHLORIDE 0.9% FLUSH
3.0000 mL | Freq: Once | INTRAVENOUS | Status: AC
  Administered 2023-08-06: 3 mL via INTRAVENOUS

## 2023-08-06 MED ORDER — STROKE: EARLY STAGES OF RECOVERY BOOK
Freq: Once | Status: AC
  Filled 2023-08-06: qty 1

## 2023-08-06 MED ORDER — IOHEXOL 350 MG/ML SOLN
75.0000 mL | Freq: Once | INTRAVENOUS | Status: AC | PRN
  Administered 2023-08-06: 75 mL via INTRAVENOUS
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
## 2023-08-06 NOTE — Progress Notes (Signed)
 EEG complete - results pending

## 2023-08-06 NOTE — Progress Notes (Signed)
 PT Cancellation Note  Patient Details Name: Cheryl Martin MRN: [id number] DOB: 24-Aug-1930   Cancelled Treatment:    Reason Eval/Treat Not Completed: Patient at procedure or test/unavailable. EEG wires being applied at the time of PT arrival. PT will follow up at a later time.   Rexie Catena 08/06/2023, 4:23 PM

## 2023-08-06 NOTE — Progress Notes (Signed)
 Per MRI, they are sending for pt now. Pt can be done after MRI

## 2023-08-06 NOTE — Assessment & Plan Note (Addendum)
 Check Sed rate, CRP. Neurologically seems intact right now.   S/p code stroke. Pendig mri brain t.r.o CVA. Seems less likely at this time given presentation.   Appreciate neurology input. Concern for complex migrain s.p. torodol and compazine . I will add acetaminophen  iv. EEG pending.

## 2023-08-06 NOTE — ED Triage Notes (Signed)
 Pt coming in from home pt last known well 11 am pt then took a shower and at 1140 pt was act9ing strange and having trouble understnding her daughter. Pt had a history of a TIA 1 year ago. Pt reports a 3/10 headache . Pt reported issue with her vision for ems but was unable to articulate what was different. Pt ambulates independently at baseline.  Ems Vitals 170/84 Hr 70 Spo2 96 Cbg 94

## 2023-08-06 NOTE — Progress Notes (Signed)
 Patient has arrived to MRI.
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
## 2023-08-06 NOTE — ED Notes (Signed)
 Patient and patient's family member, at bedside state that patient did receive oral tylenol  from previous RN.

## 2023-08-06 NOTE — Code Documentation (Signed)
 Cheryl Martin is a 89 yr old female with a PMH of ADD, arthritis, CAD, DDD, prior stroke arriving to [facility] via EMS on 08/06/2023. She is coming from home where she was last known well today at 11:00. At 11:40, she was found to have difficulty speaking. Code stroke activated by EMS for aphasia. She is not on any anticoagulants.    Pt met at bridge by stroke team. Labs, CBG obtained, airway cleared by EDP. Pt to CT with team. NIHSS 1. Pt with mild aphasia. The following imaging was obtained: CT, CTA. Per Dr. Bonnita Buttner, CT is negative for hemorrhage, and CTA is negative for LVO.     Pt back to ED Trauma A where her workup will continue. She will need q 30 min VS and NIHSS until 3:30, then q 2 hr for 12 hours, then q 4. Pt is not eligible for EVT as she is LVO negative. She is currently too mild to treat with TNK, but will be within the window until 15:30. Please notify stroke team for neurological worsening. Pt should be kept NPO until passes Stroke Swallow Screen. Keep BP<220/110. Bedside handoff with Mylinda Asa RN complete.

## 2023-08-06 NOTE — Procedures (Signed)
 Patient Name: Cheryl Martin  MRN: [id number]  Epilepsy Attending: Arleene Lack  Referring Physician/Provider: Laymond Priestly, NP  Date: 08/06/2023 Duration: 23.11 mins  Patient history: 89yo F with sudden onset headache. EEG to evaluate for seizure  Level of alertness: Awake, asleep  AEDs during EEG study: None  Technical aspects: This EEG study was done with scalp electrodes positioned according to the 10-20 International system of electrode placement. Electrical activity was reviewed with band pass filter of 1-70Hz , sensitivity of 7 uV/mm, display speed of 22mm/sec with a 60Hz  notched filter applied as appropriate. EEG data were recorded continuously and digitally stored.  Video monitoring was available and reviewed as appropriate.  Description: The posterior dominant rhythm consists of 8-9 Hz activity of moderate voltage (25-35 uV) seen predominantly in posterior head regions, symmetric and reactive to eye opening and eye closing. Sleep was characterized by vertex waves, sleep spindles (12 to 14 Hz), maximal frontocentral region.  Hyperventilation and photic stimulation were not performed.     IMPRESSION: This study is within normal limits. No seizures or epileptiform discharges were seen throughout the recording.  A normal interictal EEG does not exclude the diagnosis of epilepsy.  Cheryl Martin

## 2023-08-06 NOTE — ED Provider Notes (Signed)
 [place] EMERGENCY DEPARTMENT AT [facility] Provider Note   CSN: [id number] Arrival date & time: 08/06/23  1232     History  Chief complaint: Possible stroke TIA  Cheryl Martin is a 89 y.o. female.  HPI   Patient has a history of thyroid  disease, hypothyroidism, TIA,, hyperlipidemia, pneumonia who presents to the ED for an acute episode of confusion and speech difficulty.  Patient was at home with her daughter.  Seen normal at about 11 AM.  She went to take a shower.  Patient came out and at 11:40 AM was very confused.  She kept on saying I do not understand.  She seemed to be having difficulty with her speech.  EMS was contacted and code stroke was activated.  Home Medications Prior to Admission medications   Medication Sig Start Date End Date Taking? Authorizing Provider  acetaminophen  (TYLENOL ) 500 MG tablet Take 2 tablets (1,000 mg total) by mouth every 8 (eight) hours as needed for mild pain (pain score 1-3) or moderate pain (pain score 4-6). 06/30/23   Onetha Bile, MD  aspirin  EC (ASPIRIN  LOW DOSE) 81 MG tablet Take 1 tablet (81 mg total) by mouth daily. Swallow whole. 10/18/22   Tomlinson, Sara Caylin, PA-C  Bioflavonoid Products (VITAMIN C) CHEW Chew 1 tablet by mouth daily.    [provider]  BIOTIN PO Take 1 tablet by mouth daily.    [provider]  Cholecalciferol (VITAMIN D -3) 25 MCG (1000 UT) CAPS Take 1,000 Units by mouth daily with breakfast.    [provider]  Cyanocobalamin (B-12) 1000 MCG SUBL Place 2,000 mcg under the tongue daily.    [provider]  donepezil  (ARICEPT ) 5 MG tablet Take 1 tablet (5 mg total) by mouth at bedtime. 01/01/23   Camara, Amadou, MD  ezetimibe  (ZETIA ) 10 MG tablet Take 1 tablet (10 mg total) by mouth daily. 01/30/23   Thukkani, Arun K, MD  Multiple Vitamin (MULTIVITAMIN WITH MINERALS) TABS tablet Take 1 tablet by mouth daily.    [provider]  nitroGLYCERIN  (NITROSTAT ) 0.4  MG SL tablet Place 1 tablet (0.4 mg total) under the tongue every 5 (five) minutes as needed for chest pain. 10/22/18 01/30/23  Liza Riggers, MD  Omega 3-6-9 Fatty Acids (OMEGA 3-6-9 PO) Take by mouth.    [provider]  OVER THE COUNTER MEDICATION Take 2 tablets by mouth daily. Lions mane    [provider]  oxyCODONE  (ROXICODONE ) 5 MG immediate release tablet Take 0.5-1 tablets (2.5-5 mg total) by mouth every 6 (six) hours as needed for severe pain (pain score 7-10). 06/30/23   Countryman, Chase, MD  Sodium Chloride , Hypertonic, (MURO 128 OP) Place 1 drop into both eyes 2 (two) times daily as needed (dry eyes).    [provider]  Turmeric, Fermin Hove, (CURCUMIN) POWD by Does not apply route.    [provider]      Allergies    Levaquin  [levofloxacin ], Oxybutynin , Tape, Beef-derived drug products, Flexeril  [cyclobenzaprine ], Mold extract [trichophyton], and Pollen extract    Review of Systems   Review of Systems  Physical Exam Updated Vital Signs BP (!) 158/72   Resp 13   Ht 1.676 m (5\' 6" )   Wt 52 kg   BMI 18.50 kg/m  Physical Exam Vitals and nursing note reviewed.  Constitutional:      General: She is not in acute distress.    Appearance: She is well-developed.  HENT:  Head: Normocephalic and atraumatic.     Right Ear: External ear normal.     Left Ear: External ear normal.  Eyes:     General: No scleral icterus.       Right eye: No discharge.        Left eye: No discharge.     Conjunctiva/sclera: Conjunctivae normal.  Neck:     Trachea: No tracheal deviation.  Cardiovascular:     Rate and Rhythm: Normal rate and regular rhythm.  Pulmonary:     Effort: Pulmonary effort is normal. No respiratory distress.     Breath sounds: Normal breath sounds. No stridor. No wheezing or rales.  Abdominal:     General: Bowel sounds are normal. There is no distension.     Palpations: Abdomen is soft.     Tenderness: There is no abdominal  tenderness. There is no guarding or rebound.  Musculoskeletal:        General: No tenderness or deformity.     Cervical back: Neck supple.  Skin:    General: Skin is warm and dry.     Findings: No rash.  Neurological:     General: No focal deficit present.     Mental Status: She is alert.     Cranial Nerves: No cranial nerve deficit, dysarthria or facial asymmetry.     Sensory: No sensory deficit.     Motor: Weakness present. No abnormal muscle tone or seizure activity.     Coordination: Coordination normal.     Comments: Patient able to name several objects but unable to recall the name "watch", patient was able to indicate that it tells time  Able to lift both legs off the bed but some difficulty in lifting more than a few inches bilaterally while sitting up  Psychiatric:        Mood and Affect: Mood normal.     ED Results / Procedures / Treatments   Labs (all labs ordered are listed, but only abnormal results are displayed) Labs Reviewed  COMPREHENSIVE METABOLIC PANEL - Abnormal; Notable for the following components:      Result Value   Total Protein 5.8 (*)    All other components within normal limits  I-STAT CHEM 8, ED - Abnormal; Notable for the following components:   Sodium 134 (*)    Calcium , Ion 1.11 (*)    All other components within normal limits  PROTIME-INR  APTT  CBC  DIFFERENTIAL  ETHANOL  URINALYSIS, ROUTINE W REFLEX MICROSCOPIC  CBG MONITORING, ED    EKG None  Radiology CT ANGIO HEAD NECK W WO CM (CODE STROKE) Result Date: 08/06/2023 CLINICAL DATA:  Aphasia, stroke suspected EXAM: CT ANGIOGRAPHY HEAD AND NECK WITH AND WITHOUT CONTRAST TECHNIQUE: Multidetector CT imaging of the head and neck was performed using the standard protocol during bolus administration of intravenous contrast. Multiplanar CT image reconstructions and MIPs were obtained to evaluate the vascular anatomy. Carotid stenosis measurements (when applicable) are obtained utilizing  NASCET criteria, using the distal internal carotid diameter as the denominator. RADIATION DOSE REDUCTION: This exam was performed according to the departmental dose-optimization program which includes automated exposure control, adjustment of the mA and/or kV according to patient size and/or use of iterative reconstruction technique. CONTRAST:  75mL OMNIPAQUE  IOHEXOL  350 MG/ML SOLN COMPARISON:  11/26/2021 CTA head and neck, correlation is also made with 08/06/2023 CT head FINDINGS: CT HEAD FINDINGS For noncontrast findings, please see same day CT head. CTA NECK FINDINGS Aortic arch: Standard branching. Imaged portion  shows no evidence of aneurysm or dissection. No significant stenosis of the major arch vessel origins. Aortic atherosclerosis. Right carotid system: No evidence of dissection, occlusion, or hemodynamically significant stenosis (greater than 50%). Left carotid system: No evidence of dissection, occlusion, or hemodynamically significant stenosis (greater than 50%). Vertebral arteries: Redemonstrated mild stenosis at the origin of the left vertebral artery. The vertebral arteries are otherwise patent to the skull base without significant stenosis. Skeleton: No acute osseous abnormality. Degenerative changes in the cervical spine. Other neck: No acute finding. Upper chest: Right-greater-than-left pleuroparenchymal scarring. No new focal pulmonary opacity or pleural effusion. With calcifications but Review of the MIP images confirms the above findings CTA HEAD FINDINGS Anterior circulation: Both internal carotid arteries are patent to the termini, without significant stenosis. A1 segments patent. Normal anterior communicating artery. Anterior cerebral arteries are patent to their distal aspects without significant stenosis. No M1 stenosis or occlusion. MCA branches perfused to their distal aspects without significant stenosis. Posterior circulation: Vertebral arteries patent to the vertebrobasilar junction  without significant stenosis. Posterior inferior cerebellar arteries patent proximally. Basilar patent to its distal aspect without significant stenosis. Superior cerebellar arteries patent proximally. Patent right A1. Aplastic left A1. Fetal origin of the left PCA from the left posterior communicating artery. PCAs perfused to their distal aspects without significant stenosis. Venous sinuses: As permitted by contrast timing, patent. Anatomic variants: Fetal left PCA. No evidence of aneurysm or vascular malformation. Review of the MIP images confirms the above findings IMPRESSION: 1. No intracranial large vessel occlusion or significant stenosis. 2. No hemodynamically significant stenosis in the neck. 3. Aortic atherosclerosis. Aortic Atherosclerosis (ICD10-I70.0). Imaging results were communicated on 08/06/2023 at 1:09 pm to provider Dr. Bonnita Buttner via secure text paging. Electronically Signed   By: Zoila Hines M.D.   On: 08/06/2023 13:09   CT HEAD CODE STROKE WO CONTRAST Result Date: 08/06/2023 CLINICAL DATA:  Code stroke.  Aphasia EXAM: CT HEAD WITHOUT CONTRAST TECHNIQUE: Contiguous axial images were obtained from the base of the skull through the vertex without intravenous contrast. RADIATION DOSE REDUCTION: This exam was performed according to the departmental dose-optimization program which includes automated exposure control, adjustment of the mA and/or kV according to patient size and/or use of iterative reconstruction technique. COMPARISON:  11/24/2021 CT head FINDINGS: Brain: No evidence of acute infarction, hemorrhage, mass, mass effect, or midline shift. No hydrocephalus or extra-axial collection. Periventricular white matter changes, likely the sequela of chronic small vessel ischemic disease. Vascular: Possible hyperdense left MCA branch in insula (series 2, image 11). No other hyperdense vessel. Skull: Negative for fracture or focal lesion. Sinuses/Orbits: No acute finding. Other: The mastoid air cells  are well aerated. ASPECTS [facility] Stroke Program Early CT Score) - Ganglionic level infarction (caudate, lentiform nuclei, internal capsule, insula, M1-M3 cortex): 7 - Supraganglionic infarction (M4-M6 cortex): 3 Total score (0-10 with 10 being normal): 10 IMPRESSION: 1. Possible hyperdense left MCA branch in the insula. No other hyperdense vessel. No acute intracranial hemorrhage. 2. ASPECTS is 10. Imaging results were communicated on 08/06/2023 at 12:45 pm to provider Dr. Bonnita Buttner via secure text paging. Electronically Signed   By: Zoila Hines M.D.   On: 08/06/2023 12:45    Procedures Procedures    Medications Ordered in ED Medications   stroke: early stages of recovery book (has no administration in time range)  ketorolac  (TORADOL ) 30 MG/ML injection 15 mg (has no administration in time range)  prochlorperazine  (COMPAZINE ) injection 10 mg (has no administration in time range)  sodium  chloride flush (NS) 0.9 % injection 3 mL (3 mLs Intravenous Given 08/06/23 1314)  iohexol  (OMNIPAQUE ) 350 MG/ML injection 75 mL (75 mLs Intravenous Contrast Given 08/06/23 1249)    ED Course/ Medical Decision Making/ A&P Clinical Course as of 08/06/23 1615  Wed Aug 06, 2023  1303 CBC normal. [JK]  1303 Head CT shows possible hyperdense left MCA.  No acute hemorrhage noted [JK]  1315 CT angio of the head without significant occlusion [JK]  1318 Reviewed case with Dr. Bonnita Buttner.  Findings could be related to possible complex migraine as well.  Plan is for admission to the hospital for further workup.  He recommended a migraine cocktail in the meantime. [JK]  1357 Case discussed with Dr Cleda Curly [JK]    Clinical Course User Index [JK] Trish Furl, MD                                 Medical Decision Making Amount and/or Complexity of Data Reviewed Labs: ordered. Radiology: ordered.  Risk Prescription drug management. Decision regarding hospitalization.   Patient presented to the ED for evaluation of acute  speech difficulty and confusion.  Presentation concerning for the possibility of TIA/stroke.  Patient was activated as a code stroke and seen by Dr. Bonnita Buttner on arrival.  Patient symptoms seem to be improving.  Not a tPA candidate.  It is possible patient may have had a complex migraine.  Occult stroke is still a concern.  Plan is for admission to the hospital for further workup and evaluation.        Final Clinical Impression(s) / ED Diagnoses Final diagnoses:  TIA (transient ischemic attack)    Rx / DC Orders ED Discharge Orders     None         Trish Furl, MD 08/06/23 1615

## 2023-08-07 DIAGNOSIS — G459 Transient cerebral ischemic attack, unspecified: Principal | ICD-10-CM

## 2023-08-07 LAB — BASIC METABOLIC PANEL
Anion gap: 9 (ref 5–15)
BUN: 14 mg/dL (ref 8–23)
CO2: 22 mmol/L (ref 22–32)
Calcium: 8.9 mg/dL (ref 8.9–10.3)
Chloride: 104 mmol/L (ref 98–111)
Creatinine, Ser: 0.79 mg/dL (ref 0.44–1.00)
GFR, Estimated: >60 mL/min (ref >60)
Glucose, Bld: 85 mg/dL (ref 70–99)
Potassium: 3.5 mmol/L (ref 3.5–5.1)
Sodium: 135 mmol/L (ref 135–145)

## 2023-08-07 LAB — CBC
HCT: 32.2 % — ABNORMAL LOW (ref 36.0–46.0)
Hemoglobin: 11 g/dL — ABNORMAL LOW (ref 12.0–15.0)
MCH: 31.6 pg (ref 26.0–34.0)
MCHC: 34.2 g/dL (ref 30.0–36.0)
MCV: 92.5 fL (ref 80.0–100.0)
Platelets: 201 10*3/uL (ref 150–400)
RBC: 3.48 MIL/uL — ABNORMAL LOW (ref 3.87–5.11)
RDW: 12.5 % (ref 11.5–15.5)
WBC: 6.5 10*3/uL (ref 4.0–10.5)
nRBC: 0.3 % — ABNORMAL HIGH (ref 0.0–0.2)

## 2023-08-07 LAB — LIPID PANEL
Cholesterol: 187 mg/dL (ref 0–200)
HDL: 63 mg/dL (ref >40)
LDL Cholesterol: 109 mg/dL — ABNORMAL HIGH (ref 0–99)
Total CHOL/HDL Ratio: 3 {ratio}
Triglycerides: 75 mg/dL (ref <150)
VLDL: 15 mg/dL (ref 0–40)

## 2023-08-07 LAB — PROTIME-INR
INR: 1.1 (ref 0.8–1.2)
Prothrombin Time: 14.9 s (ref 11.4–15.2)

## 2023-08-07 LAB — APTT: aPTT: 28 s (ref 24–36)

## 2023-08-07 MED ORDER — CLOPIDOGREL BISULFATE 75 MG PO TABS: 75.0000 mg | ORAL_TABLET | Freq: Every day | ORAL | 11 refills | Status: AC

## 2023-08-07 MED ORDER — ASPIRIN 81 MG PO TBEC: 81.0000 mg | DELAYED_RELEASE_TABLET | Freq: Every day | ORAL | 0 refills | Status: AC

## 2023-08-07 MED ORDER — ROSUVASTATIN CALCIUM 20 MG PO TABS: 20.0000 mg | ORAL_TABLET | Freq: Every day | ORAL | 3 refills | Status: DC

## 2023-08-07 MED ORDER — ROSUVASTATIN CALCIUM 20 MG PO TABS: 20.0000 mg | ORAL_TABLET | Freq: Every day | ORAL | Status: DC

## 2023-08-07 NOTE — ED Notes (Signed)
Pt used bedside commode with minimal assistance by NT.

## 2023-08-07 NOTE — TOC Transition Note (Signed)
Transition of Care [facility]) - Discharge Note   Patient Details  Name: Cheryl Martin MRN: [id number] Date of Birth: June 24, 1931  [facility]) CM/SW Contact:  Kermit Balo, RN Phone Number: 08/07/2023, 3:35 PM   Clinical Narrative:     Pt is discharging home with self care. PT recommending outpt therapy. Pt prefers to decline at this time and follow up with her back MD.  Pt has supervision at home. Daughter provides needed transportation and will transport home today.  Final next level of care: Home/Self Care Barriers to Discharge: No Barriers Identified   Patient Goals and CMS Choice            Discharge Placement                       Discharge Plan and Services Additional resources added to the After Visit Summary for                                       Social Drivers of Health (SDOH) Interventions SDOH Screenings   Food Insecurity: No Food Insecurity (08/07/2023)  Housing: Low Risk  (08/07/2023)  Transportation Needs: No Transportation Needs (08/07/2023)  Utilities: Not At Risk (08/07/2023)  Alcohol Screen: Low Risk  (01/14/2023)  Depression (PHQ2-9): Low Risk  (01/14/2023)  Financial Resource Strain: Low Risk  (01/14/2023)  Physical Activity: Sufficiently Active (01/14/2023)  Social Connections: Moderately Integrated (08/07/2023)  Stress: No Stress Concern Present (01/14/2023)  Tobacco Use: Medium Risk (08/06/2023)     Readmission Risk Interventions    11/15/2022    4:25 PM  Readmission Risk Prevention Plan  Transportation Screening Complete  PCP or Specialist Appt within 5-7 Days Complete  Home Care Screening Complete  Medication Review (RN CM) Complete

## 2023-08-07 NOTE — ED Notes (Signed)
Admitting at bedside 

## 2023-08-07 NOTE — Progress Notes (Signed)
SLP Cancellation Note  Patient Details Name: Cheryl Martin MRN: [id number] DOB: 03/09/1931   Cancelled treatment:       Reason Eval/Treat Not Completed: SLP screened, no needs identified, will sign off   Aydden Cumpian, Riley Nearing 08/07/2023, 2:33 PM

## 2023-08-07 NOTE — Progress Notes (Signed)
OT Cancellation Note  Patient Details Name: Cheryl Martin MRN: [id number] DOB: 24-May-1931   Cancelled Treatment:    Reason Eval/Treat Not Completed: OT screened, no needs identified, will sign off.  Advised by evaluating PT that no OT needs exist.    Chloey Ricard D Beatriz Settles 08/07/2023, 2:16 PM 08/07/2023  RP, OTR/L  Acute Rehabilitation Services  Office:  [phone]

## 2023-08-07 NOTE — ED Notes (Signed)
Neurology at bedside.

## 2023-08-07 NOTE — Discharge Summary (Signed)
Physician Discharge Summary   Patient: Cheryl Martin MRN: [id number] DOB: 10/07/1930  Admit date:     08/06/2023  Discharge date: 08/07/23  Discharge Physician: Meredeth Ide   PCP: [name], PA   Recommendations at discharge:   Take aspirin and Plavix together for 3 weeks then stop aspirin continue taking Plavix indefinitely  Discharge Diagnoses: Principal Problem:   Headache  Resolved Problems:   * No resolved hospital problems. *  Hospital Course: 89 y.o. female with medical history significant of PMH as listed below with a TIA/CVA in 2020 with no residual deficit.  presented with sudden onset of word finding difficulty and speech output paucity along with a severe headache   Assessment and Plan:  TIA -Complex migraine versus TIA -EEG negative for seizure -Loop recorder interrogation showed no A-fib activity -CTA head and neck showed no LVO or hemodynamically significant stenosis -MRI brain showed no acute abnormality -Neurology recommends to discharge on aspirin Plavix for 3 weeks and then stop aspirin and continue taking Plavix -Outpatient PT ordered  Hyperlipidemia -Continue Zetia -Rosuvastatin 20 mg daily added  Hypertension -Blood pressure is soft -Not taking medications at home      Consultants: Neurology Procedures performed:  Disposition: Home Diet recommendation:  Discharge Diet Orders (From admission, onward)     Start     Ordered   08/07/23 0000  Diet - low sodium heart healthy        08/07/23 1505           Regular diet DISCHARGE MEDICATION: Allergies as of 08/07/2023       Reactions   Levaquin [levofloxacin] Other (See Comments)   Extreme lethargy    Ditropan [oxybutynin] Other (See Comments)   Cannot tolerate at higher doses, caused dehydration    Tape Other (See Comments)   Skin will tear with medical tape, but can tolerate paper tape only   Beef-derived Drug Products Other (See Comments)   Personal preference    Flexeril [cyclobenzaprine] Other (See Comments)   Extreme lethargy   Mold Extract [trichophyton] Other (See Comments)   Runny nose (with dust, also)   Pollen Extract Other (See Comments)   Runny nose        Medication List     TAKE these medications    acetaminophen 500 MG tablet Commonly known as: TYLENOL Take 2 tablets (1,000 mg total) by mouth every 8 (eight) hours as needed for mild pain (pain score 1-3) or moderate pain (pain score 4-6).   aspirin EC 81 MG tablet Commonly known as: Aspirin Low Dose Take 1 tablet (81 mg total) by mouth daily at 12 noon for 21 days. Take aspirin and Plavix together for 21 days and then stop aspirin and continue taking Plavix indefinitely What changed: additional instructions   clopidogrel 75 MG tablet Commonly known as: Plavix Take 1 tablet (75 mg total) by mouth daily.   donepezil 5 MG tablet Commonly known as: ARICEPT Take 1 tablet (5 mg total) by mouth at bedtime.   ezetimibe 10 MG tablet Commonly known as: ZETIA Take 1 tablet (10 mg total) by mouth daily. What changed: when to take this   Multivitamin Women 50+ Tabs Take 1 tablet by mouth daily at 12 noon.   nitroGLYCERIN 0.4 MG SL tablet Commonly known as: NITROSTAT Place 1 tablet (0.4 mg total) under the tongue every 5 (five) minutes as needed for chest pain.   OMEGA-3 FISH OIL PO Take 1 capsule by mouth daily at 12  noon.   oxyCODONE 5 MG immediate release tablet Commonly known as: Roxicodone Take 0.5-1 tablets (2.5-5 mg total) by mouth every 6 (six) hours as needed for severe pain (pain score 7-10).   rosuvastatin 20 MG tablet Commonly known as: CRESTOR Take 1 tablet (20 mg total) by mouth daily.   TURMERIC CURCUMIN PO Take 1 tablet by mouth daily at 12 noon.   VITAMIN B-12 SL Place 1 tablet under the tongue daily at 12 noon.   VITAMIN C PO Take 1 tablet by mouth daily at 12 noon.   VITAMIN D-3 PO Take 1 tablet by mouth daily at 12 noon.         Discharge Exam: Filed Weights   08/06/23 1200  Weight: 52 kg   General-appears in no acute distress Heart-S1-S2, regular, no murmur auscultated Lungs-clear to auscultation bilaterally, no wheezing or crackles auscultated Abdomen-soft, nontender, no organomegaly Extremities-no edema in the lower extremities Neuro-alert, oriented x3, no focal deficit noted  Condition at discharge: good  The results of significant diagnostics from this hospitalization (including imaging, microbiology, ancillary and laboratory) are listed below for reference.   Imaging Studies: EEG adult Result Date: 08/06/2023 Charlsie Quest, MD     08/06/2023  7:07 PM Patient Name: Cheryl Martin MRN: [id number] Epilepsy Attending: Charlsie Quest Referring Physician/Provider: Mathews Argyle, NP Date: 08/06/2023 Duration: 23.11 mins Patient history: 89yo F with sudden onset headache. EEG to evaluate for seizure Level of alertness: Awake, asleep AEDs during EEG study: None Technical aspects: This EEG study was done with scalp electrodes positioned according to the 10-20 International system of electrode placement. Electrical activity was reviewed with band pass filter of 1-70Hz , sensitivity of 7 uV/mm, display speed of 67mm/sec with a 60Hz  notched filter applied as appropriate. EEG data were recorded continuously and digitally stored.  Video monitoring was available and reviewed as appropriate. Description: The posterior dominant rhythm consists of 8-9 Hz activity of moderate voltage (25-35 uV) seen predominantly in posterior head regions, symmetric and reactive to eye opening and eye closing. Sleep was characterized by vertex waves, sleep spindles (12 to 14 Hz), maximal frontocentral region.  Hyperventilation and photic stimulation were not performed.   IMPRESSION: This study is within normal limits. No seizures or epileptiform discharges were seen throughout the recording. A normal interictal EEG does not exclude the  diagnosis of epilepsy. Charlsie Quest   MR BRAIN WO CONTRAST Result Date: 08/06/2023 CLINICAL DATA:  Stroke, follow-up EXAM: MRI HEAD WITHOUT CONTRAST TECHNIQUE: Multiplanar, multiecho pulse sequences of the brain and surrounding structures were obtained without intravenous contrast. COMPARISON:  11/26/2021 MR I head, 08/06/2023 CT head FINDINGS: Brain: No restricted diffusion to suggest acute or subacute infarct. No acute hemorrhage, mass, mass effect, or midline shift. No hydrocephalus or extra-axial collection. Pituitary and craniocervical junction within normal limits. Punctate focus of hemosiderin deposition in the left periventricular white matter, possibly the sequela of prior hypertensive microhemorrhage. Scattered and confluent T2 hyperintense signal in the periventricular white matter, likely the sequela of moderate chronic small vessel ischemic disease. Remote lacunar infarcts in the bilateral basal ganglia. Dilated perivascular spaces in the bilateral basal ganglia and hippocampi. Vascular: Normal arterial flow voids. Skull and upper cervical spine: Normal marrow signal. Sinuses/Orbits: Mild mucosal thickening in the ethmoid air cells. Status post bilateral lens replacements. No acute finding in the orbits. IMPRESSION: No acute intracranial process. No evidence of acute or subacute infarct. Electronically Signed   By: Wiliam Ke M.D.   On: 08/06/2023  16:57   ECHOCARDIOGRAM COMPLETE Result Date: 08/06/2023    ECHOCARDIOGRAM REPORT   Patient Name:   IOLENE POMAR Date of Exam: 08/06/2023 Medical Rec #:  [id number]         Height:       66.0 in Accession #:    [id number]        Weight:       114.6 lb Date of Birth:  03-12-1931        BSA:          1.579 m Patient Age:    89 years          BP:           161/77 mmHg Patient Gender: F                 HR:           62 bpm. Exam Location:  Inpatient Procedure: 2D Echo, Color Doppler and Cardiac Doppler Indications:    Stroke i63.9  History:         Patient has prior history of Echocardiogram examinations, most                 recent 11/27/2021. Risk Factors:Hypertension and Dyslipidemia.  Sonographer:    Irving Burton Senior RDCS Referring Phys: [phone] DENISE A WOLFE IMPRESSIONS  1. Left ventricular ejection fraction, by estimation, is 60 to 65%. The left ventricle has normal function. The left ventricle has no regional wall motion abnormalities. Left ventricular diastolic parameters were normal.  2. Right ventricular systolic function is normal. The right ventricular size is normal. There is normal pulmonary artery systolic pressure. The estimated right ventricular systolic pressure is 27.6 mmHg.  3. The mitral valve is normal in structure. Trivial mitral valve regurgitation. No evidence of mitral stenosis.  4. The aortic valve was not well visualized. Aortic valve regurgitation is trivial. No aortic stenosis is present.  5. The inferior vena cava is normal in size with greater than 50% respiratory variability, suggesting right atrial pressure of 3 mmHg. FINDINGS  Left Ventricle: Left ventricular ejection fraction, by estimation, is 60 to 65%. The left ventricle has normal function. The left ventricle has no regional wall motion abnormalities. The left ventricular internal cavity size was normal in size. There is  no left ventricular hypertrophy. Left ventricular diastolic parameters were normal. Right Ventricle: The right ventricular size is normal. No increase in right ventricular wall thickness. Right ventricular systolic function is normal. There is normal pulmonary artery systolic pressure. The tricuspid regurgitant velocity is 2.48 m/s, and  with an assumed right atrial pressure of 3 mmHg, the estimated right ventricular systolic pressure is 27.6 mmHg. Left Atrium: Left atrial size was normal in size. Right Atrium: Right atrial size was normal in size. Pericardium: There is no evidence of pericardial effusion. Mitral Valve: The mitral valve is normal in structure.  Trivial mitral valve regurgitation. No evidence of mitral valve stenosis. Tricuspid Valve: The tricuspid valve is normal in structure. Tricuspid valve regurgitation is trivial. Aortic Valve: The aortic valve was not well visualized. Aortic valve regurgitation is trivial. No aortic stenosis is present. Pulmonic Valve: The pulmonic valve was not well visualized. Pulmonic valve regurgitation is not visualized. Aorta: The aortic root and ascending aorta are structurally normal, with no evidence of dilitation. Venous: The inferior vena cava is normal in size with greater than 50% respiratory variability, suggesting right atrial pressure of 3 mmHg. IAS/Shunts: The atrial septum is grossly normal.  LEFT VENTRICLE PLAX  2D LVIDd:         3.80 cm   Diastology LVIDs:         2.80 cm   LV e' medial:    7.94 cm/s LV PW:         0.70 cm   LV E/e' medial:  7.7 LV IVS:        0.90 cm   LV e' lateral:   10.40 cm/s LVOT diam:     1.90 cm   LV E/e' lateral: 5.8 LV SV:         59 LV SV Index:   37 LVOT Area:     2.84 cm  RIGHT VENTRICLE RV S prime:     10.60 cm/s TAPSE (M-mode): 1.8 cm LEFT ATRIUM             Index        RIGHT ATRIUM           Index LA diam:        2.40 cm 1.52 cm/m   RA Area:     13.80 cm LA Vol (A2C):   32.3 ml 20.46 ml/m  RA Volume:   36.30 ml  23.00 ml/m LA Vol (A4C):   46.6 ml 29.52 ml/m LA Biplane Vol: 40.2 ml 25.47 ml/m  AORTIC VALVE LVOT Vmax:   69.50 cm/s LVOT Vmean:  49.500 cm/s LVOT VTI:    0.208 m  AORTA Ao Root diam: 3.10 cm Ao Asc diam:  2.40 cm MITRAL VALVE               TRICUSPID VALVE MV Area (PHT): 2.73 cm    TR Peak grad:   24.6 mmHg MV Decel Time: 278 msec    TR Vmax:        248.00 cm/s MV E velocity: 60.80 cm/s MV A velocity: 89.30 cm/s  SHUNTS MV E/A ratio:  0.68        Systemic VTI:  0.21 m                            Systemic Diam: 1.90 cm Epifanio Lesches MD Electronically signed by Epifanio Lesches MD Signature Date/Time: 08/06/2023/4:50:24 PM    Final    DG Chest Portable 1  View Result Date: 08/06/2023 CLINICAL DATA:  Weakness code stroke EXAM: PORTABLE CHEST 1 VIEW COMPARISON:  06/30/2023 FINDINGS: No acute airspace disease or effusion. Normal cardiomediastinal silhouette with aortic atherosclerosis. Electronic device over the left chest as before. IMPRESSION: No active disease. Electronically Signed   By: Jasmine Pang M.D.   On: 08/06/2023 15:11   CT ANGIO HEAD NECK W WO CM (CODE STROKE) Result Date: 08/06/2023 CLINICAL DATA:  Aphasia, stroke suspected EXAM: CT ANGIOGRAPHY HEAD AND NECK WITH AND WITHOUT CONTRAST TECHNIQUE: Multidetector CT imaging of the head and neck was performed using the standard protocol during bolus administration of intravenous contrast. Multiplanar CT image reconstructions and MIPs were obtained to evaluate the vascular anatomy. Carotid stenosis measurements (when applicable) are obtained utilizing NASCET criteria, using the distal internal carotid diameter as the denominator. RADIATION DOSE REDUCTION: This exam was performed according to the departmental dose-optimization program which includes automated exposure control, adjustment of the mA and/or kV according to patient size and/or use of iterative reconstruction technique. CONTRAST:  75mL OMNIPAQUE IOHEXOL 350 MG/ML SOLN COMPARISON:  11/26/2021 CTA head and neck, correlation is also made with 08/06/2023 CT head FINDINGS: CT HEAD FINDINGS For noncontrast findings, please see  same day CT head. CTA NECK FINDINGS Aortic arch: Standard branching. Imaged portion shows no evidence of aneurysm or dissection. No significant stenosis of the major arch vessel origins. Aortic atherosclerosis. Right carotid system: No evidence of dissection, occlusion, or hemodynamically significant stenosis (greater than 50%). Left carotid system: No evidence of dissection, occlusion, or hemodynamically significant stenosis (greater than 50%). Vertebral arteries: Redemonstrated mild stenosis at the origin of the left vertebral  artery. The vertebral arteries are otherwise patent to the skull base without significant stenosis. Skeleton: No acute osseous abnormality. Degenerative changes in the cervical spine. Other neck: No acute finding. Upper chest: Right-greater-than-left pleuroparenchymal scarring. No new focal pulmonary opacity or pleural effusion. With calcifications but Review of the MIP images confirms the above findings CTA HEAD FINDINGS Anterior circulation: Both internal carotid arteries are patent to the termini, without significant stenosis. A1 segments patent. Normal anterior communicating artery. Anterior cerebral arteries are patent to their distal aspects without significant stenosis. No M1 stenosis or occlusion. MCA branches perfused to their distal aspects without significant stenosis. Posterior circulation: Vertebral arteries patent to the vertebrobasilar junction without significant stenosis. Posterior inferior cerebellar arteries patent proximally. Basilar patent to its distal aspect without significant stenosis. Superior cerebellar arteries patent proximally. Patent right A1. Aplastic left A1. Fetal origin of the left PCA from the left posterior communicating artery. PCAs perfused to their distal aspects without significant stenosis. Venous sinuses: As permitted by contrast timing, patent. Anatomic variants: Fetal left PCA. No evidence of aneurysm or vascular malformation. Review of the MIP images confirms the above findings IMPRESSION: 1. No intracranial large vessel occlusion or significant stenosis. 2. No hemodynamically significant stenosis in the neck. 3. Aortic atherosclerosis. Aortic Atherosclerosis (ICD10-I70.0). Imaging results were communicated on 08/06/2023 at 1:09 pm to provider Dr. Wilford Corner via secure text paging. Electronically Signed   By: Wiliam Ke M.D.   On: 08/06/2023 13:09   CT HEAD CODE STROKE WO CONTRAST Result Date: 08/06/2023 CLINICAL DATA:  Code stroke.  Aphasia EXAM: CT HEAD WITHOUT  CONTRAST TECHNIQUE: Contiguous axial images were obtained from the base of the skull through the vertex without intravenous contrast. RADIATION DOSE REDUCTION: This exam was performed according to the departmental dose-optimization program which includes automated exposure control, adjustment of the mA and/or kV according to patient size and/or use of iterative reconstruction technique. COMPARISON:  11/24/2021 CT head FINDINGS: Brain: No evidence of acute infarction, hemorrhage, mass, mass effect, or midline shift. No hydrocephalus or extra-axial collection. Periventricular white matter changes, likely the sequela of chronic small vessel ischemic disease. Vascular: Possible hyperdense left MCA branch in insula (series 2, image 11). No other hyperdense vessel. Skull: Negative for fracture or focal lesion. Sinuses/Orbits: No acute finding. Other: The mastoid air cells are well aerated. ASPECTS [facility] Stroke Program Early CT Score) - Ganglionic level infarction (caudate, lentiform nuclei, internal capsule, insula, M1-M3 cortex): 7 - Supraganglionic infarction (M4-M6 cortex): 3 Total score (0-10 with 10 being normal): 10 IMPRESSION: 1. Possible hyperdense left MCA branch in the insula. No other hyperdense vessel. No acute intracranial hemorrhage. 2. ASPECTS is 10. Imaging results were communicated on 08/06/2023 at 12:45 pm to provider Dr. Wilford Corner via secure text paging. Electronically Signed   By: Wiliam Ke M.D.   On: 08/06/2023 12:45    Microbiology: Results for orders placed or performed in visit on 07/31/23  Microscopic Examination     Status: Abnormal   Collection Time: 07/31/23  1:47 PM   Urine  Result Value Ref Range Status  WBC, UA 0-5 0 - 5 /hpf Final   RBC, Urine 3-10 (A) 0 - 2 /hpf Final   Epithelial Cells (non renal) CANCELED      Comment: Test not performed  Result canceled by the ancillary.    Mucus, UA Present (A) Not Estab. Final   Bacteria, UA Few (A) None seen/Few Final     Labs: CBC: Recent Labs  Lab 08/06/23 1234 08/06/23 1242 08/06/23 1616 08/07/23 0315  WBC 5.3  --  5.6 6.5  NEUTROABS 3.3  --   --   --   HGB 12.3 12.2 13.4 11.0*  HCT 36.9 36.0 38.8 32.2*  MCV 95.3  --  92.2 92.5  PLT 208  --  266 201   Basic Metabolic Panel: Recent Labs  Lab 08/06/23 1234 08/06/23 1242 08/06/23 1616 08/07/23 0315  NA 135 134*  --  135  K 4.6 4.5  --  3.5  CL 103 103  --  104  CO2 22  --   --  22  GLUCOSE 90 83  --  85  BUN 16 21  --  14  CREATININE 0.71 0.70 0.67 0.79  CALCIUM 9.1  --   --  8.9   Liver Function Tests: Recent Labs  Lab 08/06/23 1234  AST 32  ALT 15  ALKPHOS 61  BILITOT 1.0  PROT 5.8*  ALBUMIN 3.6   CBG: Recent Labs  Lab 08/06/23 1234  GLUCAP 83    Discharge time spent: greater than 30 minutes.  Signed: Meredeth Ide, MD [facility] 08/07/2023

## 2023-08-07 NOTE — Evaluation (Signed)
Physical Therapy Evaluation Patient Details Name: Cheryl Martin MRN: [id number] DOB: 1930/11/03 Today's Date: 08/07/2023  History of Present Illness  89 y.o. female presents to [facility] hospital on 08/06/2023 with L frontal/temporal HA, along with a brief period of expressive aphasia. CT/CTA negative. MRI negative. EEG negative. Loop recorder interrogation shows no A-fib on 08/06/23. PMH includes ex lap 11/15/2022 with d/c on 5/2, L3-5 hemilaminetomy with foraminotomies 10/11/22, CAD, TIA, HLD, OA, anemia.   Clinical Impression  Cheryl Martin is 89 y.o. female admitted with above HPI and diagnosis. Patient is currently limited by functional impairments below (see PT problem list). Patient lives with daughter and is independent with no AD for mobility at baseline. Currently pt is mobilizing at Mod ind to supervision level with no AD for transfers and gait. Overall gait is steady with cautious pace and no LOB noted throughout. Pt has slight generalized weakness in bil UE/LE, this is baseline, and has a history of Lt LE sciatica. Patient will benefit from continued skilled PT interventions to address impairments and progress independence with mobility, recommending follow up OPPT for generalized strengthening and Lt hip/sciatic pain. Pt has no further acute PT needs, will sign off and encourage mobility with RN/NT staff.      If plan is discharge home, recommend the following: Assist for transportation;Help with stairs or ramp for entrance   Can travel by private vehicle        Equipment Recommendations None recommended by PT  Recommendations for Other Services       Functional Status Assessment Patient has had a recent decline in their functional status and demonstrates the ability to make significant improvements in function in a reasonable and predictable amount of time.     Precautions / Restrictions Precautions Precautions: Fall Restrictions Weight Bearing Restrictions Per Provider  Order: No      Mobility  Bed Mobility Overal bed mobility: Modified Independent             General bed mobility comments: extra time    Transfers Overall transfer level: Modified independent Equipment used: None               General transfer comment: use of UE's to rise and lower    Ambulation/Gait Ambulation/Gait assistance: Supervision Gait Distance (Feet): 200 Feet Assistive device: None Gait Pattern/deviations: Step-through pattern, Decreased stride length Gait velocity: decr/cautious     General Gait Details: overall steady slow cautious gait, no lateral drift, no buckling, no LOB.  Stairs            Wheelchair Mobility     Tilt Bed    Modified Rankin (Stroke Patients Only)       Balance Overall balance assessment: Needs assistance Sitting-balance support: Feet supported Sitting balance-Leahy Scale: Good     Standing balance support: No upper extremity supported, During functional activity Standing balance-Leahy Scale: Good Standing balance comment: steady with gait, no significant dynamic challenges                             Pertinent Vitals/Pain Pain Assessment Pain Assessment: No/denies pain    Home Living Family/patient expects to be discharged to:: Private residence Living Arrangements: Children (daughter Okey Regal) Available Help at Discharge: Family Type of Home: House Home Access: Stairs to enter Entrance Stairs-Rails: Left Entrance Stairs-Number of Steps: 3-4   Home Layout: One level Home Equipment: Cane - single Librarian, academic (2 wheels);BSC/3in1;Grab bars - tub/shower (squatty potty) Additional  Comments: independent    Prior Function Prior Level of Function : Independent/Modified Independent             Mobility Comments: pt reports using RW PTA, was receiving HHPT ADLs Comments: independent, drives occasionally, but only short distances    Muscle bulk: normal, tone normal  MMT: Mvmt  Root Nerve  Muscle Right Left Comments  SA C5/6 Ax Deltoid 4 4    EF C5/6 Mc Biceps 4 4    EE C6/7/8 Rad Triceps 4 4    WF C6/7 Med FCR 4  4    WE C7/8 PIN ECU  4  4    F Ab C8/T1 U ADM/FDI      HF L1/2/3 Fem Illopsoas 3+ 3+    KE L2/3/4 Fem Quad  4  4    KF L5/S1/2 Sciatic Ham 4 4   DF L4/5 D Peron Tib Ant 4 4    PF S1/2 Tibial Grc/Sol 4 4-     Sensation:  Light touch Intact bil   Pin prick     Temperature     Vibration    Proprioception  intact bil    Coordination/Complex Motor:  - Finger to Nose: intact bil - Heel to shin: intact bil - Rapid alternating movement:  - Gait: slow, steady, and cautious, no staggering, drift, or over LOB   Extremity/Trunk Assessment   Upper Extremity Assessment Upper Extremity Assessment: Overall WFL for tasks assessed    Lower Extremity Assessment Lower Extremity Assessment: Overall WFL for tasks assessed    Cervical / Trunk Assessment Cervical / Trunk Assessment: Normal  Communication   Communication Communication: No apparent difficulties  Cognition Arousal: Alert Behavior During Therapy: WFL for tasks assessed/performed Overall Cognitive Status: Within Functional Limits for tasks assessed                                          General Comments      Exercises     Assessment/Plan    PT Assessment All further PT needs can be met in the next venue of care  PT Problem List Decreased strength;Decreased activity tolerance;Decreased mobility       PT Treatment Interventions      PT Goals (Current goals can be found in the Care Plan section)  Acute Rehab PT Goals Patient Stated Goal: return home and maintain independence PT Goal Formulation: All assessment and education complete, DC therapy Time For Goal Achievement: 08/14/23 Potential to Achieve Goals: Good    Frequency       Co-evaluation               AM-PAC PT "6 Clicks" Mobility  Outcome Measure Help needed turning from your back to  your side while in a flat bed without using bedrails?: None Help needed moving from lying on your back to sitting on the side of a flat bed without using bedrails?: None Help needed moving to and from a bed to a chair (including a wheelchair)?: None Help needed standing up from a chair using your arms (e.g., wheelchair or bedside chair)?: None Help needed to walk in hospital room?: A Little Help needed climbing 3-5 steps with a railing? : A Little 6 Click Score: 22    End of Session Equipment Utilized During Treatment: Gait belt Activity Tolerance: Patient tolerated treatment well Patient left: in bed;with family/visitor present Nurse Communication:  Mobility status PT Visit Diagnosis: Muscle weakness (generalized) (M62.81);Difficulty in walking, not elsewhere classified (R26.2);Other symptoms and signs involving the nervous system (R29.898);Other abnormalities of gait and mobility (R26.89)    Time: 1326-1350 PT Time Calculation (min) (ACUTE ONLY): 24 min   Charges:   PT Evaluation $PT Eval Low Complexity: 1 Low PT Treatments $Gait Training: 8-22 mins PT General Charges $$ ACUTE PT VISIT: 1 Visit         Wynn Maudlin, DPT Acute Rehabilitation Services Office [phone]  08/07/23 3:05 PM

## 2023-08-07 NOTE — Care Management Obs Status (Signed)
MEDICARE OBSERVATION STATUS NOTIFICATION   Patient Details  Name: Cheryl Martin MRN: [id number] Date of Birth: 1930/09/21   Medicare Observation Status Notification Given:  Yes    Kermit Balo, RN 08/07/2023, 3:45 PM

## 2023-08-07 NOTE — Progress Notes (Addendum)
STROKE TEAM PROGRESS NOTE   BRIEF HPI [name] is a 89 y.o. female with history of ADD, arthritis, CAD, DDD, hiatal hernia, hyperlipidemia, hypothyroidism and stroke in 2020 presenting with a transient episode of aphasia in which patient stared off into space blankly and repeated I do not know multiple times.  Patient was unable to form complete sentences or have speech that made much sense.  While she was en route to the hospital, her speech improved and she has good memory of these events.  Loop recorder reveals no episodes of A-fib through today.  NIH on Admission 0  INTERIM HISTORY/SUBJECTIVE Patient has been afebrile with stable vital signs.  Awaiting loop recorder interrogation for remainder of yesterday and today. Patient states she had somewhat similar episode but much more transient a few years ago but is not able to provide specific details.  She admits to mild short-term memory difficulties but is still able to look after most of her needs OBJECTIVE  CBC    Component Value Date/Time   WBC 6.5 08/07/2023 0315   RBC 3.48 (L) 08/07/2023 0315   HGB 11.0 (L) 08/07/2023 0315   HGB 12.2 04/07/2023 1009   HCT 32.2 (L) 08/07/2023 0315   HCT 38.1 04/07/2023 1009   PLT 201 08/07/2023 0315   PLT 247 04/07/2023 1009   MCV 92.5 08/07/2023 0315   MCV 94 04/07/2023 1009   MCH 31.6 08/07/2023 0315   MCHC 34.2 08/07/2023 0315   RDW 12.5 08/07/2023 0315   RDW 11.9 04/07/2023 1009   LYMPHSABS 1.1 08/06/2023 1234   LYMPHSABS 1.0 04/07/2023 1009   MONOABS 0.6 08/06/2023 1234   EOSABS 0.3 08/06/2023 1234   EOSABS 0.2 04/07/2023 1009   BASOSABS 0.1 08/06/2023 1234   BASOSABS 0.1 04/07/2023 1009    BMET    Component Value Date/Time   NA 135 08/07/2023 0315   NA 139 04/07/2023 1009   K 3.5 08/07/2023 0315   CL 104 08/07/2023 0315   CO2 22 08/07/2023 0315   GLUCOSE 85 08/07/2023 0315   BUN 14 08/07/2023 0315   BUN 13 04/07/2023 1009   CREATININE 0.79 08/07/2023 0315    CREATININE 0.84 03/14/2016 1020   CALCIUM 8.9 08/07/2023 0315   EGFR 75 04/07/2023 1009   GFRNONAA >60 08/07/2023 0315   GFRNONAA 65 03/14/2016 1020    IMAGING past 24 hours EEG adult Result Date: 08/06/2023 Charlsie Quest, MD     08/06/2023  7:07 PM Patient Name: Cheryl Martin MRN: [id number] Epilepsy Attending: Charlsie Quest Referring Physician/Provider: Mathews Argyle, NP Date: 08/06/2023 Duration: 23.11 mins Patient history: 89yo F with sudden onset headache. EEG to evaluate for seizure Level of alertness: Awake, asleep AEDs during EEG study: None Technical aspects: This EEG study was done with scalp electrodes positioned according to the 10-20 International system of electrode placement. Electrical activity was reviewed with band pass filter of 1-70Hz , sensitivity of 7 uV/mm, display speed of 39mm/sec with a 60Hz  notched filter applied as appropriate. EEG data were recorded continuously and digitally stored.  Video monitoring was available and reviewed as appropriate. Description: The posterior dominant rhythm consists of 8-9 Hz activity of moderate voltage (25-35 uV) seen predominantly in posterior head regions, symmetric and reactive to eye opening and eye closing. Sleep was characterized by vertex waves, sleep spindles (12 to 14 Hz), maximal frontocentral region.  Hyperventilation and photic stimulation were not performed.   IMPRESSION: This study is within normal limits. No seizures or  epileptiform discharges were seen throughout the recording. A normal interictal EEG does not exclude the diagnosis of epilepsy. Charlsie Quest   MR BRAIN WO CONTRAST Result Date: 08/06/2023 CLINICAL DATA:  Stroke, follow-up EXAM: MRI HEAD WITHOUT CONTRAST TECHNIQUE: Multiplanar, multiecho pulse sequences of the brain and surrounding structures were obtained without intravenous contrast. COMPARISON:  11/26/2021 MR I head, 08/06/2023 CT head FINDINGS: Brain: No restricted diffusion to suggest acute or  subacute infarct. No acute hemorrhage, mass, mass effect, or midline shift. No hydrocephalus or extra-axial collection. Pituitary and craniocervical junction within normal limits. Punctate focus of hemosiderin deposition in the left periventricular white matter, possibly the sequela of prior hypertensive microhemorrhage. Scattered and confluent T2 hyperintense signal in the periventricular white matter, likely the sequela of moderate chronic small vessel ischemic disease. Remote lacunar infarcts in the bilateral basal ganglia. Dilated perivascular spaces in the bilateral basal ganglia and hippocampi. Vascular: Normal arterial flow voids. Skull and upper cervical spine: Normal marrow signal. Sinuses/Orbits: Mild mucosal thickening in the ethmoid air cells. Status post bilateral lens replacements. No acute finding in the orbits. IMPRESSION: No acute intracranial process. No evidence of acute or subacute infarct. Electronically Signed   By: Wiliam Ke M.D.   On: 08/06/2023 16:57   ECHOCARDIOGRAM COMPLETE Result Date: 08/06/2023    ECHOCARDIOGRAM REPORT   Patient Name:   Cheryl Martin Date of Exam: 08/06/2023 Medical Rec #:  [id number]         Height:       66.0 in Accession #:    [id number]        Weight:       114.6 lb Date of Birth:  [birth year]        BSA:          1.579 m Patient Age:    89 years          BP:           161/77 mmHg Patient Gender: F                 HR:           62 bpm. Exam Location:  Inpatient Procedure: 2D Echo, Color Doppler and Cardiac Doppler Indications:    Stroke i63.9  History:        Patient has prior history of Echocardiogram examinations, most                 recent 11/27/2021. Risk Factors:Hypertension and Dyslipidemia.  Sonographer:    Irving Burton Senior RDCS Referring Phys: [phone] DENISE A WOLFE IMPRESSIONS  1. Left ventricular ejection fraction, by estimation, is 60 to 65%. The left ventricle has normal function. The left ventricle has no regional wall motion abnormalities. Left  ventricular diastolic parameters were normal.  2. Right ventricular systolic function is normal. The right ventricular size is normal. There is normal pulmonary artery systolic pressure. The estimated right ventricular systolic pressure is 27.6 mmHg.  3. The mitral valve is normal in structure. Trivial mitral valve regurgitation. No evidence of mitral stenosis.  4. The aortic valve was not well visualized. Aortic valve regurgitation is trivial. No aortic stenosis is present.  5. The inferior vena cava is normal in size with greater than 50% respiratory variability, suggesting right atrial pressure of 3 mmHg. FINDINGS  Left Ventricle: Left ventricular ejection fraction, by estimation, is 60 to 65%. The left ventricle has normal function. The left ventricle has no regional wall motion abnormalities. The left ventricular internal cavity size was  normal in size. There is  no left ventricular hypertrophy. Left ventricular diastolic parameters were normal. Right Ventricle: The right ventricular size is normal. No increase in right ventricular wall thickness. Right ventricular systolic function is normal. There is normal pulmonary artery systolic pressure. The tricuspid regurgitant velocity is 2.48 m/s, and  with an assumed right atrial pressure of 3 mmHg, the estimated right ventricular systolic pressure is 27.6 mmHg. Left Atrium: Left atrial size was normal in size. Right Atrium: Right atrial size was normal in size. Pericardium: There is no evidence of pericardial effusion. Mitral Valve: The mitral valve is normal in structure. Trivial mitral valve regurgitation. No evidence of mitral valve stenosis. Tricuspid Valve: The tricuspid valve is normal in structure. Tricuspid valve regurgitation is trivial. Aortic Valve: The aortic valve was not well visualized. Aortic valve regurgitation is trivial. No aortic stenosis is present. Pulmonic Valve: The pulmonic valve was not well visualized. Pulmonic valve regurgitation is not  visualized. Aorta: The aortic root and ascending aorta are structurally normal, with no evidence of dilitation. Venous: The inferior vena cava is normal in size with greater than 50% respiratory variability, suggesting right atrial pressure of 3 mmHg. IAS/Shunts: The atrial septum is grossly normal.  LEFT VENTRICLE PLAX 2D LVIDd:         3.80 cm   Diastology LVIDs:         2.80 cm   LV e' medial:    7.94 cm/s LV PW:         0.70 cm   LV E/e' medial:  7.7 LV IVS:        0.90 cm   LV e' lateral:   10.40 cm/s LVOT diam:     1.90 cm   LV E/e' lateral: 5.8 LV SV:         59 LV SV Index:   37 LVOT Area:     2.84 cm  RIGHT VENTRICLE RV S prime:     10.60 cm/s TAPSE (M-mode): 1.8 cm LEFT ATRIUM             Index        RIGHT ATRIUM           Index LA diam:        2.40 cm 1.52 cm/m   RA Area:     13.80 cm LA Vol (A2C):   32.3 ml 20.46 ml/m  RA Volume:   36.30 ml  23.00 ml/m LA Vol (A4C):   46.6 ml 29.52 ml/m LA Biplane Vol: 40.2 ml 25.47 ml/m  AORTIC VALVE LVOT Vmax:   69.50 cm/s LVOT Vmean:  49.500 cm/s LVOT VTI:    0.208 m  AORTA Ao Root diam: 3.10 cm Ao Asc diam:  2.40 cm MITRAL VALVE               TRICUSPID VALVE MV Area (PHT): 2.73 cm    TR Peak grad:   24.6 mmHg MV Decel Time: 278 msec    TR Vmax:        248.00 cm/s MV E velocity: 60.80 cm/s MV A velocity: 89.30 cm/s  SHUNTS MV E/A ratio:  0.68        Systemic VTI:  0.21 m                            Systemic Diam: 1.90 cm Epifanio Lesches MD Electronically signed by Epifanio Lesches MD Signature Date/Time: 08/06/2023/4:50:24 PM    Final  DG Chest Portable 1 View Result Date: 08/06/2023 CLINICAL DATA:  Weakness code stroke EXAM: PORTABLE CHEST 1 VIEW COMPARISON:  06/30/2023 FINDINGS: No acute airspace disease or effusion. Normal cardiomediastinal silhouette with aortic atherosclerosis. Electronic device over the left chest as before. IMPRESSION: No active disease. Electronically Signed   By: Jasmine Pang M.D.   On: 08/06/2023 15:11    Vitals:    08/07/23 0813 08/07/23 1100 08/07/23 1105 08/07/23 1214  BP:   116/63 115/62  Pulse:  63 61 84  Resp:  16 13 18   Temp: 97.9 F (36.6 C)   97.9 F (36.6 C)  TempSrc: Oral   Oral  SpO2:  100% 99% 98%  Weight:      Height:         PHYSICAL EXAM General:  Alert, well-nourished, well-developed patient in no acute distress Psych:  Mood and affect appropriate for situation CV: Regular rate and rhythm on monitor Respiratory:  Regular, unlabored respirations on room air  NEURO:  Mental Status: AA&Ox3, patient is able to give clear and coherent history.  3 out of 3 registration, 1 out of 3 recall, clock drawing 4/4, able to name 8 animals with 4 legs Speech/Language: speech is without dysarthria or aphasia.    Cranial Nerves:  II: PERRL. Visual fields full.  III, IV, VI: EOMI. Eyelids elevate symmetrically.  VII: Face is symmetrical resting and smiling VIII: hearing intact to voice. IX, X: Phonation is normal.  XII: tongue is midline without fasciculations. Motor: Able to move all 4 extremities with good antigravity strength Tone: is normal and bulk is normal Sensation- Intact to light touch bilaterally.  Coordination: FTN intact bilaterally Gait- deferred  Most Recent NIH 0  ASSESSMENT/PLAN  TIA: Left-hemispheric TIA--likely cardioembolic Code Stroke CT head possible hyperdense left MCA branch ASPECTS 10.    CTA head & neck no LVO or hemodynamically significant stenosis MRI no acute abnormality 2D Echo EF 60 to 65%, normal left atrial size, normal atrial septum LDL 109 HgbA1c 5.5 EEG negative for seizure activity Loop recorder interrogation shows no A-fib through today VTE prophylaxis -Lovenox aspirin 81 mg daily prior to admission, now on aspirin 81 mg daily and clopidogrel 75 mg daily for 3 weeks and then Plavix alone. Therapy recommendations:  Pending Disposition: Home  Hypertension Home meds: None Stable Maintain normotension  Hyperlipidemia Home meds:  Ezetimibe 10 mg daily LDL 109, goal < 70 Add rosuvastatin 20 mg daily Continue statin at discharge  Other Stroke Risk Factors None   Other Active Problems None  Hospital day # 0  Patient seen by NP with MD, MD to edit note as needed. Cortney E Ernestina Columbia , MSN, AGACNP-BC [facility] See Amion for schedule and pager information 08/07/2023 12:59 PM   STROKE MD NOTE :  I have personally obtained history,examined this patient, reviewed notes, independently viewed imaging studies, participated in medical decision making and plan of care.ROS completed by me personally and pertinent positives fully documented  I have made any additions or clarifications directly to the above note. Agree with note above.  Patient presented with transient episode of expressive aphasia which resolved by the time she reached the ER.  She had somewhat similar but milder episode a few years ago.  Loop recorder interrogation does not show any paroxysmal A-fib.  Recommend aspirin and Plavix for 3 weeks followed by Plavix alone and aggressive risk factor modification.  She does have mild cognitive impairment but it appears to be age-appropriate so I  do not think this is amyloid angiopathy.  Continue ongoing stroke workup.  Follow-up with outpatient stroke clinic in 2 months.  Greater than 50% time during this 50-minute visit was spent in counseling and coordination of care and discussion with patient and care team and answering questions.  Delia Heady, MD Medical Director [facility] Pager: [phone] 08/07/2023 4:54 PM   To contact Stroke Continuity provider, please refer to [email or web address]. After hours, contact General Neurology

## 2023-08-11 ENCOUNTER — Ambulatory Visit (INDEPENDENT_AMBULATORY_CARE_PROVIDER_SITE_OTHER): Payer: Medicare HMO

## 2023-08-11 DIAGNOSIS — G459 Transient cerebral ischemic attack, unspecified: Secondary | ICD-10-CM | POA: Diagnosis not present

## 2023-08-11 LAB — CUP PACEART REMOTE DEVICE CHECK
Date Time Interrogation Session: 20250119231510
Implantable Pulse Generator Implant Date: 20231115

## 2023-08-12 NOTE — Progress Notes (Signed)
[facility] Summary Report / Loop Recorder 

## 2023-08-14 ENCOUNTER — Ambulatory Visit: Payer: Medicare HMO | Admitting: Urology

## 2023-08-14 ENCOUNTER — Encounter: Payer: Self-pay | Admitting: Urology

## 2023-08-14 VITALS — BP 131/74 | HR 73 | Ht 63.0 in | Wt 114.0 lb

## 2023-08-14 DIAGNOSIS — N3941 Urge incontinence: Secondary | ICD-10-CM

## 2023-08-14 DIAGNOSIS — R8290 Unspecified abnormal findings in urine: Secondary | ICD-10-CM | POA: Diagnosis not present

## 2023-08-14 DIAGNOSIS — N3281 Overactive bladder: Secondary | ICD-10-CM | POA: Diagnosis not present

## 2023-08-14 DIAGNOSIS — R3129 Other microscopic hematuria: Secondary | ICD-10-CM | POA: Diagnosis not present

## 2023-08-14 LAB — BLADDER SCAN AMB NON-IMAGING
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
## 2023-08-15 LAB — URINE CULTURE

## 2023-08-16 ENCOUNTER — Encounter: Payer: Self-pay | Admitting: Urology

## 2023-08-18 ENCOUNTER — Telehealth: Payer: Self-pay | Admitting: Neurology

## 2023-08-18 LAB — MICROSCOPIC EXAMINATION: WBC, UA: >30 /[HPF] — AB (ref 0–5)

## 2023-08-18 LAB — URINALYSIS, ROUTINE W REFLEX MICROSCOPIC
Bilirubin, UA: NEGATIVE
Glucose, UA: NEGATIVE
Ketones, UA: NEGATIVE
Nitrite, UA: NEGATIVE
Specific Gravity, UA: 1.02 (ref 1.005–1.030)
Urobilinogen, Ur: 0.2 mg/dL (ref 0.2–1.0)
pH, UA: 7 (ref 5.0–7.5)

## 2023-08-18 NOTE — Telephone Encounter (Signed)
Received form from [facility] clearance needed STAT bringing to POD 2 to review

## 2023-08-18 NOTE — Telephone Encounter (Signed)
Mattie,  I haven't seen this? Thanks,  Production assistant, radio

## 2023-08-19 ENCOUNTER — Other Ambulatory Visit: Payer: Self-pay | Admitting: *Deleted

## 2023-08-19 ENCOUNTER — Telehealth: Payer: Self-pay

## 2023-08-19 ENCOUNTER — Encounter: Payer: Self-pay | Admitting: Family Medicine

## 2023-08-19 ENCOUNTER — Ambulatory Visit (INDEPENDENT_AMBULATORY_CARE_PROVIDER_SITE_OTHER): Payer: Medicare HMO | Admitting: Family Medicine

## 2023-08-19 ENCOUNTER — Encounter: Payer: Self-pay | Admitting: Neurology

## 2023-08-19 VITALS — BP 111/66 | HR 70 | Ht 63.0 in | Wt 118.2 lb

## 2023-08-19 DIAGNOSIS — E785 Hyperlipidemia, unspecified: Secondary | ICD-10-CM | POA: Diagnosis not present

## 2023-08-19 DIAGNOSIS — J309 Allergic rhinitis, unspecified: Secondary | ICD-10-CM | POA: Insufficient documentation

## 2023-08-19 DIAGNOSIS — I10 Essential (primary) hypertension: Secondary | ICD-10-CM | POA: Diagnosis not present

## 2023-08-19 DIAGNOSIS — G459 Transient cerebral ischemic attack, unspecified: Secondary | ICD-10-CM

## 2023-08-19 DIAGNOSIS — D229 Melanocytic nevi, unspecified: Secondary | ICD-10-CM | POA: Diagnosis not present

## 2023-08-19 DIAGNOSIS — I70213 Atherosclerosis of native arteries of extremities with intermittent claudication, bilateral legs: Secondary | ICD-10-CM

## 2023-08-19 MED ORDER — FLUTICASONE PROPIONATE 50 MCG/ACT NA SUSP: 2.0000 | Freq: Every day | NASAL | 6 refills | Status: AC

## 2023-08-19 NOTE — Assessment & Plan Note (Signed)
Discontinue use of baking soda and water due to high sodium content.  Trial of Flonase nasal spray.  If this is ineffective, consider daily antihistamine such as cetirizine.

## 2023-08-19 NOTE — Telephone Encounter (Signed)
Call to [facility] for clarification on clearance form and requested new form. Patient not on xarelto. Currently on aspirin and plavix and should stop 5 days prior to procedure.

## 2023-08-19 NOTE — Progress Notes (Signed)
Established Patient Office Visit  Subjective   Patient ID: Cheryl Martin, female    DOB: 07/05/1931  Age: 89 y.o. MRN: [id number]  Chief Complaint  Patient presents with   Hospitalization Follow-up    HPI Cheryl Martin is a 89 y.o. female presenting today for follow up of hospitalization 08/06/2023 through 08/07/2023, presented with sudden onset of word finding difficulty and speech output paucity along with a severe headache.  Discharged on aspirin and Plavix together for 3 weeks, recommend to stop the aspirin after 3 weeks but continue Plavix indefinitely.  On discharge, they also recommended following a low-sodium heart healthy diet.  She is concerned because she has chronic rhinorrhea and typically has put 1/2 to 1 teaspoon of baking soda in a glass of water.  She states that many years ago she had allergy testing which was positive for grass, pollen, ragweed, and other common plants that she does not remember.  Her rhinorrhea is likely related to this.  She would also like a referral to a dermatologist to establish care.  She has several spots on her arms, trunk, and her forehead that concern her and she would like to be evaluated.  Her daughter was recently diagnosed with skin cancer on her face, this does concern the patient and she would like some reassurance with a full body skin check with a dermatologist.  Outpatient Medications Prior to Visit  Medication Sig   acetaminophen (TYLENOL) 500 MG tablet Take 2 tablets (1,000 mg total) by mouth every 8 (eight) hours as needed for mild pain (pain score 1-3) or moderate pain (pain score 4-6).   aspirin EC (ASPIRIN LOW DOSE) 81 MG tablet Take 1 tablet (81 mg total) by mouth daily at 12 noon for 21 days. Take aspirin and Plavix together for 21 days and then stop aspirin and continue taking Plavix indefinitely   Cholecalciferol (VITAMIN D-3 PO) Take 1 tablet by mouth daily at 12 noon.   clopidogrel (PLAVIX) 75 MG tablet Take 1 tablet (75  mg total) by mouth daily.   Cyanocobalamin (VITAMIN B-12 SL) Place 1 tablet under the tongue daily at 12 noon.   donepezil (ARICEPT) 5 MG tablet Take 1 tablet (5 mg total) by mouth at bedtime.   ezetimibe (ZETIA) 10 MG tablet Take 1 tablet (10 mg total) by mouth daily. (Patient taking differently: Take 10 mg by mouth daily at 12 noon.)   Multiple Vitamins-Minerals (MULTIVITAMIN WOMEN 50+) TABS Take 1 tablet by mouth daily at 12 noon.   Omega-3 Fatty Acids (OMEGA-3 FISH OIL PO) Take 1 capsule by mouth daily at 12 noon.   rosuvastatin (CRESTOR) 20 MG tablet Take 1 tablet (20 mg total) by mouth daily.   Ascorbic Acid (VITAMIN C PO) Take 1 tablet by mouth daily at 12 noon.   nitroGLYCERIN (NITROSTAT) 0.4 MG SL tablet Place 1 tablet (0.4 mg total) under the tongue every 5 (five) minutes as needed for chest pain.   [DISCONTINUED] oxyCODONE (ROXICODONE) 5 MG immediate release tablet Take 0.5-1 tablets (2.5-5 mg total) by mouth every 6 (six) hours as needed for severe pain (pain score 7-10). (Patient not taking: Reported on 08/06/2023)   [DISCONTINUED] TURMERIC CURCUMIN PO Take 1 tablet by mouth daily at 12 noon. (Patient not taking: Reported on 08/19/2023)   No facility-administered medications prior to visit.    ROS Negative unless otherwise noted in HPI   Objective:     BP 111/66   Pulse 70   Ht 5\' 3"  (  1.6 m)   Wt 118 lb 4 oz (53.6 kg)   SpO2 99%   BMI 20.95 kg/m   Physical Exam Constitutional:      General: She is not in acute distress.    Appearance: Normal appearance.  HENT:     Head: Normocephalic and atraumatic.  Cardiovascular:     Rate and Rhythm: Normal rate and regular rhythm.     Heart sounds: No murmur heard.    No friction rub. No gallop.  Pulmonary:     Effort: Pulmonary effort is normal. No respiratory distress.     Breath sounds: No wheezing, rhonchi or rales.  Skin:    General: Skin is warm and dry.  Neurological:     Mental Status: She is alert and oriented to  person, place, and time.      Assessment & Plan:  TIA (transient ischemic attack) Assessment & Plan: Per hospitalist, finish 21-day course of aspirin before discontinuing.  Continue Plavix indefinitely, discuss continuation with neurologist at upcoming appointment at the end of February.   Essential hypertension Assessment & Plan: Stable.  Continue low-salt diet and activity as tolerated.  Discussed low-sodium diet and goal of less than 1500 mg of sodium each day.  We did discuss that in 1 teaspoon of baking soda, there are 1260 mg of sodium which is a huge portion of the recommended total sodium intake.  Trial of Flonase nasal spray instead of baking soda and water for chronic allergic rhinitis.  Also recommended reading nutrition labels and looking at nutrition facts for food in order to ensure that she is meeting sodium daily intake goals.  Will continue to monitor.   Hyperlipidemia, unspecified hyperlipidemia type Assessment & Plan: Continue ezetimibe 10 mg daily and rosuvastatin 20 mg daily.   Chronic allergic rhinitis Assessment & Plan: Discontinue use of baking soda and water due to high sodium content.  Trial of Flonase nasal spray.  If this is ineffective, consider daily antihistamine such as cetirizine.  Orders: -     Fluticasone Propionate; Place 2 sprays into both nostrils daily.  Dispense: 16 g; Refill: 6  Multiple atypical skin moles -     Ambulatory referral to Dermatology   Return in about 7 weeks (around 10/09/2023) for follow-up (as scheduled).   I spent 40 minutes on the day of the encounter to include pre-visit record review, face-to-face time with the patient, and post visit documentation and referral.   [name], PA

## 2023-08-19 NOTE — Assessment & Plan Note (Signed)
Stable.  Continue low-salt diet and activity as tolerated.  Discussed low-sodium diet and goal of less than 1500 mg of sodium each day.  We did discuss that in 1 teaspoon of baking soda, there are 1260 mg of sodium which is a huge portion of the recommended total sodium intake.  Trial of Flonase nasal spray instead of baking soda and water for chronic allergic rhinitis.  Also recommended reading nutrition labels and looking at nutrition facts for food in order to ensure that she is meeting sodium daily intake goals.  Will continue to monitor.

## 2023-08-19 NOTE — Assessment & Plan Note (Signed)
Per hospitalist, finish 21-day course of aspirin before discontinuing.  Continue Plavix indefinitely, discuss continuation with neurologist at upcoming appointment at the end of February.

## 2023-08-19 NOTE — Telephone Encounter (Signed)
Faxed clearance and letter to [facility]

## 2023-08-19 NOTE — Assessment & Plan Note (Signed)
Continue ezetimibe 10 mg daily and rosuvastatin 20 mg daily.

## 2023-08-19 NOTE — Patient Instructions (Addendum)
Finish the whole bottle of aspirin, then you can stop taking it.  Continue the Plavix until your appointment with your neurologist.  Discuss with them how long they would like you to take it.  LOW SODIUM DIET: -GOAL: less than 1500 mg sodium each day. -1 tsp baking soda contains 1260 mg of sodium.  -Check the nutrition labels of the foods you eat, or search online for the sodium content for foods that do not have a nutrition label.  RUNNY NOSE: -to avoid the high sodium level in baking soda, we can try a nasal spray for your runny nose. I am hoping that this will be enough to help so we can avoid any additional pills!

## 2023-08-25 NOTE — Telephone Encounter (Signed)
Dr. Teresa Coombs,  Are you agreeable to 7 days? Hold for plavix? Thanks,  Production assistant, radio

## 2023-08-25 NOTE — Telephone Encounter (Signed)
 Yes, that is fine.

## 2023-08-25 NOTE — Telephone Encounter (Signed)
[facility] called. The physician there requires that Plavix be held for 7 days prior to procedure. Please addend clearance letter. A new fax will be sent over, if needed.

## 2023-08-26 NOTE — Telephone Encounter (Signed)
Refaxed to say 7 days as requested

## 2023-08-28 ENCOUNTER — Encounter: Payer: Self-pay | Admitting: Family Medicine

## 2023-09-01 ENCOUNTER — Ambulatory Visit (INDEPENDENT_AMBULATORY_CARE_PROVIDER_SITE_OTHER): Payer: Medicare HMO | Admitting: Family Medicine

## 2023-09-01 ENCOUNTER — Ambulatory Visit: Payer: Self-pay | Admitting: Family Medicine

## 2023-09-01 ENCOUNTER — Ambulatory Visit: Payer: Medicare HMO | Admitting: Surgery

## 2023-09-01 ENCOUNTER — Ambulatory Visit (HOSPITAL_COMMUNITY): Payer: Medicare HMO

## 2023-09-01 ENCOUNTER — Encounter: Payer: Self-pay | Admitting: Family Medicine

## 2023-09-01 VITALS — BP 108/66 | HR 56 | Temp 97.9°F | Ht 63.0 in | Wt 116.0 lb

## 2023-09-01 DIAGNOSIS — R300 Dysuria: Secondary | ICD-10-CM

## 2023-09-01 DIAGNOSIS — R319 Hematuria, unspecified: Secondary | ICD-10-CM

## 2023-09-01 LAB — POC URINALSYSI DIPSTICK (AUTOMATED)
Bilirubin, UA: NEGATIVE
Blood, UA: POSITIVE
Glucose, UA: NEGATIVE
Ketones, UA: NEGATIVE
Leukocytes, UA: NEGATIVE
Nitrite, UA: NEGATIVE
Protein, UA: NEGATIVE
Spec Grav, UA: 1.02 (ref 1.010–1.025)
Urobilinogen, UA: 0.2 U/dL
pH, UA: 6 (ref 5.0–8.0)

## 2023-09-01 NOTE — Progress Notes (Signed)
 Acute Office Visit  Subjective:     Patient ID: Cheryl Martin, female    DOB: 1930/10/27, 89 y.o.   MRN: [id number]  Chief Complaint  Patient presents with   Dysuria   Urinary Frequency    Dysuria  Associated symptoms include frequency.  Urinary Frequency  Associated symptoms include frequency.   Patient is in today for evaluation of dysuria, urinary urgency, frequency for the last 2 to 3 days.  Reports suprapubic pain as well. Patient is incontinent, wears pads that she states sometimes really irritate her.  She is unsure if she is having irritation or an actual UTI. Patient is companied by her daughter who is helping with history. She does have established urology care, states that they could not see her and they suggested that she see primary care. Denies gross hematuria. Has not attempted OTC treatment. Denies nausea, vomiting, diarrhea, rash, fever, chills, other symptoms. Medical history as outlined below.  Review of Systems  Genitourinary:  Positive for dysuria and frequency.   Per HPI      Objective:    BP 108/66 (BP Location: Left Arm, Patient Position: Sitting, Cuff Size: Normal)   Pulse (!) 56   Temp 97.9 F (36.6 C) (Temporal)   Ht 5\' 3"  (1.6 m)   Wt 116 lb (52.6 kg)   SpO2 95%   BMI 20.55 kg/m    Physical Exam Vitals and nursing note reviewed.  Constitutional:      General: She is not in acute distress.    Appearance: Normal appearance. She is normal weight.  HENT:     Head: Normocephalic and atraumatic.  Eyes:     Extraocular Movements: Extraocular movements intact.  Pulmonary:     Effort: Pulmonary effort is normal.  Abdominal:     General: There is no distension.     Palpations: Abdomen is soft. There is no mass.     Tenderness: There is abdominal tenderness (Suprapubic). There is no right CVA tenderness or left CVA tenderness.  Musculoskeletal:     Cervical back: Normal range of motion.  Skin:    General: Skin is warm and dry.   Neurological:     General: No focal deficit present.     Mental Status: She is alert. Mental status is at baseline.  Psychiatric:        Mood and Affect: Mood normal.        Behavior: Behavior normal.     Results for orders placed or performed in visit on 09/01/23  POCT Urinalysis Dipstick (Automated)  Result Value Ref Range   Color, UA yellow    Clarity, UA clear    Glucose, UA Negative Negative   Bilirubin, UA negative    Ketones, UA negative    Spec Grav, UA 1.020 1.010 - 1.025   Blood, UA positive    pH, UA 6.0 5.0 - 8.0   Protein, UA Negative Negative   Urobilinogen, UA 0.2 0.2 or 1.0 E.U./dL   Nitrite, UA negative    Leukocytes, UA Negative Negative        Assessment & Plan:  1. Dysuria (Primary)  - POCT Urinalysis Dipstick (Automated) - Urine Culture; Future - Urine Culture - Discussed aloe vera capsules as a supplement to help with the bladder irritation and burning while we are waiting on urine culture results  2. Hematuria, unspecified type  - Urine Culture; Future - Urine Culture   No orders of the defined types were placed in this encounter.  Return if symptoms worsen or fail to improve.  Casimer Clear, FNP

## 2023-09-01 NOTE — Telephone Encounter (Signed)
 Copied from CRM [id number]. Topic: Clinical - Red Word Triage >> Sep 01, 2023 10:00 AM Lorenz Romano B wrote: [facility] that prompted transfer to Nurse Triage: having extreme pain when she pee   Chief Complaint: pain with urination Symptoms: pain with urination, frequency, urgency Frequency: more frequent Pertinent Negatives: Patient denies blood in urine Disposition: [] 911 / [] ED /[] Urgent Care (no appt availability in office) / [x] Appointment(In office/virtual)/ []  [facility]/ [] Home Care/ [] Refused Recommended Disposition /[]  Mobile Bus/ []  Follow-up with PCP Additional Notes: Pt daughter reporting "really bad" pain with urination each time, also frequency and urgency. Pt daughter reporting symptoms of UTI last week that improved with sitz bath, cranberry juice, tylenol , but symptoms came back. Pt confirms excruciating pain with urination. Pt daughter attempted to take temp for pt, did not work. Advised pt be examined within next 4 hours, no availability with PCP office, scheduled with [facility] today, confirmed location/appt info, advised call back if worsening. Pt daughter verbalized understanding.  Reason for Disposition  [1] SEVERE pain with urination (e.g., excruciating) AND [2] not improved after 2 hours of pain medicine and Sitz bath  Answer Assessment - Initial Assessment Questions 1. SEVERITY: "How bad is the pain?"  (e.g., Scale 1-10; mild, moderate, or severe)   - MILD (1-3): complains slightly about urination hurting   - MODERATE (4-7): interferes with normal activities     - SEVERE (8-10): excruciating, unwilling or unable to urinate because of the pain      Really bad this morning, was already experiencing and didn't say anything to me, then this morning moaning and groaning 2. FREQUENCY: "How many times have you had painful urination today?"      2x today 3. PATTERN: "Is pain present every time you urinate or just sometimes?"      These last 2 times, she  said not as much pain during the night 4. ONSET: "When did the painful urination start?"      unsure 5. FEVER: "Do you have a fever?" If Yes, ask: "What is your temperature, how was it measured, and when did it start?"     Haven't even checked 6. PAST UTI: "Have you had a urine infection before?" If Yes, ask: "When was the last time?" and "What happened that time?"      Yes, think several days ago, did sitz bath, cranberry juice, tylenol  and she felt like it went away, but symptoms came back, also within the last month and got treated for it with an antibiotic 7. CAUSE: "What do you think is causing the painful urination?"  (e.g., UTI, scratch, Herpes sore)     UTI 8. OTHER SYMPTOMS: "Do you have any other symptoms?" (e.g., blood in urine, flank pain, genital sores, urgency, vaginal discharge)     Going a lot more and running to bathroom  Protocols used: Urination Pain - Female-A-AH

## 2023-09-01 NOTE — Patient Instructions (Signed)
 Your urine had a little blood in it in the office today.   We have sent it to the lab for culture and confirmation.  We will be in contact with results that require further attention.  For now, push fluids to help keep your bladder flushed out and decrease the concentration of your urine to help alleviate some of the pain.

## 2023-09-02 LAB — URINE CULTURE: Result:: NO GROWTH

## 2023-09-15 ENCOUNTER — Ambulatory Visit (INDEPENDENT_AMBULATORY_CARE_PROVIDER_SITE_OTHER): Payer: Medicare HMO

## 2023-09-15 DIAGNOSIS — H18593 Other hereditary corneal dystrophies, bilateral: Secondary | ICD-10-CM | POA: Diagnosis not present

## 2023-09-15 DIAGNOSIS — G459 Transient cerebral ischemic attack, unspecified: Secondary | ICD-10-CM

## 2023-09-15 DIAGNOSIS — H31101 Choroidal degeneration, unspecified, right eye: Secondary | ICD-10-CM | POA: Diagnosis not present

## 2023-09-15 DIAGNOSIS — D3132 Benign neoplasm of left choroid: Secondary | ICD-10-CM | POA: Diagnosis not present

## 2023-09-15 DIAGNOSIS — H35373 Puckering of macula, bilateral: Secondary | ICD-10-CM | POA: Diagnosis not present

## 2023-09-15 DIAGNOSIS — H04123 Dry eye syndrome of bilateral lacrimal glands: Secondary | ICD-10-CM | POA: Diagnosis not present

## 2023-09-16 ENCOUNTER — Other Ambulatory Visit: Payer: Self-pay

## 2023-09-16 ENCOUNTER — Ambulatory Visit: Payer: Self-pay | Admitting: Urology

## 2023-09-16 DIAGNOSIS — E785 Hyperlipidemia, unspecified: Secondary | ICD-10-CM
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
## 2023-09-17 ENCOUNTER — Telehealth: Payer: Self-pay | Admitting: Urology

## 2023-09-17 ENCOUNTER — Ambulatory Visit: Payer: Medicare HMO | Admitting: Adult Health

## 2023-09-17 ENCOUNTER — Encounter: Payer: Self-pay | Admitting: Adult Health

## 2023-09-17 VITALS — BP 112/56 | HR 69 | Ht 63.0 in | Wt 121.2 lb

## 2023-09-17 DIAGNOSIS — G459 Transient cerebral ischemic attack, unspecified: Secondary | ICD-10-CM | POA: Diagnosis not present

## 2023-09-17 LAB — CUP PACEART REMOTE DEVICE CHECK
Date Time Interrogation Session: 20250223231520
Implantable Pulse Generator Implant Date: 20231115

## 2023-09-17 NOTE — Patient Instructions (Addendum)
 Continue clopidogrel 75 mg daily  and Crestor for secondary stroke prevention  Continue to follow up with PCP regarding cholesterol management  Maintain strict control of cholesterol with LDL cholesterol (bad cholesterol) goal below 70 mg/dL.   Signs of a Stroke? Follow the BEFAST method:  Balance Watch for a sudden loss of balance, trouble with coordination or vertigo Eyes Is there a sudden loss of vision in one or both eyes? Or double vision?  Face: Ask the person to smile. Does one side of the face droop or is it numb?  Arms: Ask the person to raise both arms. Does one arm drift downward? Is there weakness or numbness of a leg? Speech: Ask the person to repeat a simple phrase. Does the speech sound slurred/strange? Is the person confused ? Time: If you observe any of these signs, call 911.        Thank you for coming to see [place] at [facility]. I hope we have been able to provide you high quality care today.  You may receive a patient satisfaction survey over the next few weeks. We would appreciate your feedback and comments so that we may continue to improve ourselves and the health of our patients.   Transient Ischemic Attack A transient ischemic attack (TIA) causes the same symptoms as a stroke, but the symptoms go away quickly. A TIA happens when blood flow to the brain is blocked. Having a TIA means you may be at risk for a stroke. A TIA is a medical emergency. What are the causes? A TIA is caused by a blocked artery in the head or neck. This means the brain does not get the blood supply it needs. A blockage can be caused by: Fatty buildup in an artery in the head or neck. A blood clot. A tear in an artery. Irritation and swelling (inflammation) of an artery. Sometimes the cause is not known. What increases the risk? Certain things may make you more likely to have a TIA. Some of these are things that you can change, such as: Using products that have  nicotine or tobacco. Not being active. Drinking too much alcohol. Using recreational drugs. Health conditions that may increase your risk include: High blood pressure. High cholesterol. Diabetes. Heart disease. A heartbeat that is not regular (atrial fibrillation). Sickle cell disease. Problems with blood clotting. Other risk factors include: Being over the age of 15. Being female. Being very overweight. Sleep problems (sleep apnea). Having a family history of stroke. Having had blood clots, stroke, TIA, or heart attack in the past. What are the signs or symptoms? The symptoms of a TIA are like those of a stroke. They can include: Weakness or loss of feeling in your face, arm, or leg. This often happens on one side of your body. Trouble walking. Trouble moving your arms or legs. Trouble talking or understanding what people are saying. Problems with how you see. Feeling dizzy. Feeling confused. Loss of balance or coordination. Feeling like you may vomit (nausea) or vomiting. Having a very bad headache. If you can, note what time you started to have symptoms. Tell your doctor. How is this treated? The goal of treatment is to lower the risk for a stroke. This may include: Changes to diet and lifestyle, such as getting regular exercise and stopping smoking. Taking medicines to: Thin the blood. Lower blood pressure. Lower cholesterol. Treating other health conditions, such as diabetes. If testing shows that an artery in your brain is narrow, your  doctor may recommend a procedure to: Take the blockage out of your artery. Open or widen an artery in your neck (carotid angioplasty and stenting). Follow these instructions at home: Medicines Take over-the-counter and prescription medicines only as told by your doctor. If you were told to take aspirin or another medicine to thin your blood, use it exactly as told by your doctor. Taking too much of the medicine can cause  bleeding. Taking too little of the medicine may not work to treat the problem. Eating and drinking  Eat 5 or more servings of fruits and vegetables each day. Follow instructions from your doctor about your diet. You may need to follow a certain diet to help lower your risk of a stroke. You may need to: Eat a diet that is low in fat and salt. Eat foods with a lot of fiber. Limit carbohydrates and sugar. If you drink alcohol: Limit how much you have to: 0-1 drink a day for women who are not pregnant. 0-2 drinks a day for men. Know how much alcohol is in a drink. In the [place] one drink equals one 12 oz bottle of beer (355 mL), one 5 oz glass of wine (148 mL), or one 1 oz glass of hard liquor (44 mL). General instructions Keep a healthy weight. Try to get at least 30 minutes of exercise on most days. Get treatment if you have sleep problems. Do not smoke or use any products that contain nicotine or tobacco. If you need help quitting, ask your doctor. Do not use drugs. Keep all follow-up visits. Your doctor will want to know if you have any more symptoms and to check blood labs if any medicines were prescribed. Where to find more information American Stroke Association: stroke.org Get help right away if: You have chest pain. You have a heartbeat that is not regular. You have any signs of a stroke. "BE FAST" is an easy way to remember the main warning signs: B - Balance. Dizziness, sudden trouble walking, or loss of balance. E - Eyes. Trouble seeing or a change in how you see. F - Face. Sudden weakness or loss of feeling of the face. The face or eyelid may droop on one side. A - Arms. Weakness or loss of feeling in an arm. This happens all of a sudden and most often on one side of the body. S - Speech. Sudden trouble speaking, slurred speech, or trouble understanding what people say. T - Time. Time to call emergency services. Write down what time symptoms started. You have other signs of  a stroke, such as: A sudden, very bad headache with no known cause. Feeling like you may vomit. Vomiting. A seizure. These symptoms may be an emergency. Get help right away. Call 911. Do not wait to see if the symptoms will go away. Do not drive yourself to the hospital. This information is not intended to replace advice given to you by your health care provider. Make sure you discuss any questions you have with your health care provider. Document Revised: 12/21/2021 Document Reviewed: 12/21/2021 Elsevier Patient Education  2024 [employer].

## 2023-09-17 NOTE — Telephone Encounter (Signed)
 Pt daughter called and lvm to see if you could call her with some other options as far a botox treatments for her mom's bladder. She does not feel like that is working as efficiently the longer she does it. Please advise.

## 2023-09-18 ENCOUNTER — Encounter: Payer: Self-pay | Admitting: Adult Health

## 2023-09-18 ENCOUNTER — Other Ambulatory Visit: Payer: Self-pay | Admitting: Urology

## 2023-09-18 NOTE — Progress Notes (Signed)
 [facility] Summary Report / Loop Recorder

## 2023-09-22 ENCOUNTER — Ambulatory Visit: Payer: Medicare HMO | Admitting: Physician Assistant

## 2023-09-22 ENCOUNTER — Encounter: Payer: Self-pay | Admitting: Surgery

## 2023-09-22 ENCOUNTER — Ambulatory Visit (HOSPITAL_COMMUNITY)
Admission: RE | Admit: 2023-09-22 | Discharge: 2023-09-22 | Disposition: A | Discharge status: Home / Self Care | Payer: Medicare HMO | Source: Ambulatory Visit | Attending: Surgery | Admitting: Surgery

## 2023-09-22 VITALS — BP 127/65 | HR 60 | Temp 97.6°F | Resp 18 | Ht 63.0 in | Wt 121.3 lb

## 2023-09-22 DIAGNOSIS — I70213 Atherosclerosis of native arteries of extremities with intermittent claudication, bilateral legs: Secondary | ICD-10-CM

## 2023-09-22 DIAGNOSIS — I739 Peripheral vascular disease, unspecified: Secondary | ICD-10-CM

## 2023-09-22 LAB — VAS US ABI WITH/WO TBI
Left ABI: 0.81
Right ABI: 1.01

## 2023-09-22 NOTE — Progress Notes (Signed)
 Office Note   History of Present Illness   Cheryl Martin is a 89 y.o. ([birth year]) female who presents for surveillance of PAD.  She has been previously evaluated by Dr. Myra Gianotti for abnormal screening ABIs.  She has no prior history of claudication, rest pain, or tissue loss.  She does have sciatica, which causes pain in her left leg.  She also occasionally gets leg cramps at night, which is relieved by walking and taking mustard.  The patient returns today for follow up.  She denies any recent medical history changes.  She denies any claudication, rest pain, or tissue loss.  She tries to walk for exercise, however her mobility is largely limited by her scoliosis and sciatica.  Current Outpatient Medications  Medication Sig Dispense Refill   acetaminophen (TYLENOL) 500 MG tablet Take 2 tablets (1,000 mg total) by mouth every 8 (eight) hours as needed for mild pain (pain score 1-3) or moderate pain (pain score 4-6). 30 tablet 0   Ascorbic Acid (VITAMIN C PO) Take 1 tablet by mouth daily at 12 noon.     Cholecalciferol (VITAMIN D-3 PO) Take 1 tablet by mouth daily at 12 noon.     clopidogrel (PLAVIX) 75 MG tablet Take 1 tablet (75 mg total) by mouth daily. 30 tablet 11   Cyanocobalamin (VITAMIN B-12 SL) Place 1 tablet under the tongue daily at 12 noon.     donepezil (ARICEPT) 5 MG tablet Take 1 tablet (5 mg total) by mouth at bedtime. 30 tablet 11   ezetimibe (ZETIA) 10 MG tablet Take 1 tablet (10 mg total) by mouth daily. (Patient taking differently: Take 10 mg by mouth daily at 12 noon.) 90 tablet 3   fluticasone (FLONASE) 50 MCG/ACT nasal spray Place 2 sprays into both nostrils daily. 16 g 6   Multiple Vitamins-Minerals (MULTIVITAMIN WOMEN 50+) TABS Take 1 tablet by mouth daily at 12 noon.     Omega-3 Fatty Acids (OMEGA-3 FISH OIL PO) Take 1 capsule by mouth daily at 12 noon.     rosuvastatin (CRESTOR) 20 MG tablet Take 1 tablet (20 mg total) by mouth daily. 30 tablet 3    nitroGLYCERIN (NITROSTAT) 0.4 MG SL tablet Place 1 tablet (0.4 mg total) under the tongue every 5 (five) minutes as needed for chest pain. 45 tablet 2   No current facility-administered medications for this visit.    REVIEW OF SYSTEMS (negative unless checked):   Cardiac:  []  Chest pain or chest pressure? []  Shortness of breath upon activity? []  Shortness of breath when lying flat? []  Irregular heart rhythm?  Vascular:  []  Pain in calf, thigh, or hip brought on by walking? []  Pain in feet at night that wakes you up from your sleep? []  Blood clot in your veins? []  Leg swelling?  Pulmonary:  []  Oxygen at home? []  Productive cough? []  Wheezing?  Neurologic:  []  Sudden weakness in arms or legs? []  Sudden numbness in arms or legs? []  Sudden onset of difficult speaking or slurred speech? []  Temporary loss of vision in one eye? []  Problems with dizziness?  Gastrointestinal:  []  Blood in stool? []  Vomited blood?  Genitourinary:  []  Burning when urinating? []  Blood in urine?  Psychiatric:  []  Major depression  Hematologic:  []  Bleeding problems? []  Problems with blood clotting?  Dermatologic:  []  Rashes or ulcers?  Constitutional:  []  Fever or chills?  Ear/Nose/Throat:  []  Change in hearing? []  Nose bleeds? []  Sore throat?  Musculoskeletal:  []   Back pain? []  Joint pain? []  Muscle pain?   Physical Examination   Vitals:   09/22/23 0927  BP: 127/65  Pulse: 60  Resp: 18  Temp: 97.6 F (36.4 C)  TempSrc: Temporal  SpO2: 97%  Weight: 121 lb 4.8 oz (55 kg)  Height: 5\' 3"  (1.6 m)   Body mass index is 21.49 kg/m.  General:  WDWN in NAD; vital signs documented above Gait: Not observed HENT: WNL, normocephalic Pulmonary: normal non-labored breathing , without rales, rhonchi,  wheezing Cardiac: regular Abdomen: soft, NT, no masses Skin: without rashes Vascular Exam/Pulses: Nonpalpable left pedal pulses. Palpable right DP Extremities: without ischemic  changes, without gangrene , without cellulitis; without open wounds;  Musculoskeletal: no muscle wasting or atrophy  Neurologic: A&O X 3;  No focal weakness or paresthesias are detected Psychiatric:  The pt has Normal affect.  Non-Invasive Vascular imaging   ABI (09/22/2023) R:  ABI: 1.01 (0.91),  PT: bi DP: mono TBI:  0.43 L:  ABI: 0.81 (0.78),  PT: tri DP: tri TBI: 0.42   Medical Decision Making   Cheryl Martin is a 89 y.o. female who presents for surveillance of PAD  Based on the patient's vascular studies, her ABIs are essentially unchanged since her last office visit.  Her right ABI is 1.01 and left ABI is 0.81 She denies any claudication, rest pain, or tissue loss.  She does have a history of sciatica with radiating left leg pain  On exam she has a palpable right DP pulse.  She has nonpalpable pedal pulses on the left.  She does have intact DP/PT Doppler signals on the left  She can follow-up with our office in 1 year with repeat ABIs   Loel Dubonnet PA-C [facility] Office: [phone]  Clinic MD: Myra Gianotti

## 2023-09-26 NOTE — Telephone Encounter (Signed)
 LMOM for pt to return call.

## 2023-10-02 ENCOUNTER — Other Ambulatory Visit: Payer: Medicare HMO

## 2023-10-02 DIAGNOSIS — Z1329 Encounter for screening for other suspected endocrine disorder: Secondary | ICD-10-CM

## 2023-10-02 DIAGNOSIS — E785 Hyperlipidemia, unspecified: Secondary | ICD-10-CM

## 2023-10-02 DIAGNOSIS — Z131 Encounter for screening for diabetes mellitus: Secondary | ICD-10-CM | POA: Diagnosis not present

## 2023-10-02 DIAGNOSIS — I10 Essential (primary) hypertension: Secondary | ICD-10-CM | POA: Diagnosis not present

## 2023-10-03 LAB — CBC WITH DIFFERENTIAL/PLATELET
Basophils Absolute: 0.1 10*3/uL (ref 0.0–0.2)
Basos: 1 %
EOS (ABSOLUTE): 0.2 10*3/uL (ref 0.0–0.4)
Eos: 4 %
Hematocrit: 39.3 % (ref 34.0–46.6)
Hemoglobin: 13.1 g/dL (ref 11.1–15.9)
Immature Grans (Abs): 0 10*3/uL (ref 0.0–0.1)
Immature Granulocytes: 0 %
Lymphocytes Absolute: 1.1 10*3/uL (ref 0.7–3.1)
Lymphs: 22 %
MCH: 31.5 pg (ref 26.6–33.0)
MCHC: 33.3 g/dL (ref 31.5–35.7)
MCV: 95 fL (ref 79–97)
Monocytes Absolute: 0.5 10*3/uL (ref 0.1–0.9)
Monocytes: 11 %
Neutrophils Absolute: 3.2 10*3/uL (ref 1.4–7.0)
Neutrophils: 62 %
Platelets: 217 10*3/uL (ref 150–450)
RBC: 4.16 x10E6/uL (ref 3.77–5.28)
RDW: 11.8 % (ref 11.7–15.4)
WBC: 5.1 10*3/uL (ref 3.4–10.8)

## 2023-10-03 LAB — COMPREHENSIVE METABOLIC PANEL
ALT: 27 IU/L (ref 0–32)
AST: 28 IU/L (ref 0–40)
Albumin: 4.4 g/dL (ref 3.6–4.6)
Alkaline Phosphatase: 98 IU/L (ref 44–121)
BUN/Creatinine Ratio: 19 (ref 12–28)
BUN: 13 mg/dL (ref 10–36)
Bilirubin Total: 0.4 mg/dL (ref 0.0–1.2)
CO2: 24 mmol/L (ref 20–29)
Calcium: 9.7 mg/dL (ref 8.7–10.3)
Chloride: 97 mmol/L (ref 96–106)
Creatinine, Ser: 0.7 mg/dL (ref 0.57–1.00)
Globulin, Total: 1.8 g/dL (ref 1.5–4.5)
Glucose: 86 mg/dL (ref 70–99)
Potassium: 4.1 mmol/L (ref 3.5–5.2)
Sodium: 133 mmol/L — ABNORMAL LOW (ref 134–144)
Total Protein: 6.2 g/dL (ref 6.0–8.5)
eGFR: 82 mL/min/{1.73_m2} (ref >59)

## 2023-10-03 LAB — LIPID PANEL
Chol/HDL Ratio: 1.9 ratio (ref 0.0–4.4)
Cholesterol, Total: 152 mg/dL (ref 100–199)
HDL: 82 mg/dL (ref >39)
LDL Chol Calc (NIH): 54 mg/dL (ref 0–99)
Triglycerides: 85 mg/dL (ref 0–149)
VLDL Cholesterol Cal: 16 mg/dL (ref 5–40)

## 2023-10-03 LAB — TSH RFX ON ABNORMAL TO FREE T4: TSH: 3.92 u[IU]/mL (ref 0.450–4.500)

## 2023-10-03 LAB — HEMOGLOBIN A1C
Est. average glucose Bld gHb Est-mCnc: 97 mg/dL
Hgb A1c MFr Bld: 5 % (ref 4.8–5.6)

## 2023-10-09 ENCOUNTER — Encounter: Payer: Medicare HMO | Admitting: Family Medicine

## 2023-10-14 ENCOUNTER — Ambulatory Visit (INDEPENDENT_AMBULATORY_CARE_PROVIDER_SITE_OTHER): Admitting: Family Medicine

## 2023-10-14 ENCOUNTER — Encounter: Payer: Self-pay | Admitting: Family Medicine

## 2023-10-14 VITALS — BP 126/70 | HR 61 | Ht 63.0 in | Wt 121.1 lb

## 2023-10-14 DIAGNOSIS — R32 Unspecified urinary incontinence: Secondary | ICD-10-CM | POA: Diagnosis not present

## 2023-10-14 DIAGNOSIS — G459 Transient cerebral ischemic attack, unspecified: Secondary | ICD-10-CM | POA: Diagnosis not present

## 2023-10-14 DIAGNOSIS — Z0000 Encounter for general adult medical examination without abnormal findings: Secondary | ICD-10-CM

## 2023-10-14 NOTE — Progress Notes (Unsigned)
   Annual physical  Subjective    Patient ID: KIRSTEN SPEARING, female    DOB: 09/06/1930  Age: 89 y.o. MRN: [id number]  No chief complaint on file.  HPI Nidia is a 89 y.o. old female here  for annual exam.   Lives with daughter.    Work:*** Relationship:*** Children:*** Tobacco:*** Alcohol:*** Recreational drugs:*** Menstruation:*** Diet:*** Exercise:***  Family history of breast, ovarian, endometrial, colorectal cancer:***  Advance directive:***  Other providers:cardiologist, neurology, urology, vein and vasc.     The patient has the following chronic health issues that are monitored on a routine basis: *** Labs-mild hyponatremia Meds-Plavix Donepezil Zetia and rosuvastatin  TIA/aphasia in January.  Sees neurology.  This is where she is on Plavix and rosuvastatin  Sleeping at night - overactive bladder - dr. Pete Glatter.  Botox in bladder. January. Every hour and half.    Pain in leg - hydrates to help  the pain.   Sciatica - left leg lower extremity pain a few years.  Water helps it.  Scoliosis.  Left leg.  Tingling ache.  neurologiest  Spots on arms.  - senile purpura.    HPI  Separate, acute concerns today: ***  The ASCVD Risk score (Arnett DK, et al., 2019) failed to calculate for the following reasons:   The 2019 ASCVD risk score is only valid for ages 64 to 86   Risk score cannot be calculated because patient has a medical history suggesting prior/existing ASCVD  Health Maintenance Due  Topic Date Due   Zoster Vaccines- Shingrix (1 of 2) Never done   COVID-19 Vaccine (4 - 2024-25 season) 03/23/2023      Objective:     There were no vitals taken for this visit. {Vitals History (Optional):23777}  Physical Exam   No results found for any visits on 10/14/23.      Assessment & Plan:   There are no diagnoses linked to this encounter.   No follow-ups on file.    Sandre Kitty, MD

## 2023-10-14 NOTE — Patient Instructions (Signed)
 It was nice to see you today,  We addressed the following topics today: -You can try taking magnesium oxide over-the-counter at 400 mg prior to bed to help with muscle cramps.  You can also take any over-the-counter topical pain reliever and try those.  They are safe as long as you use them as directed - Talk to your urologist about any medication options that may be appropriate before you try any procedures.  The side effects may not make them appropriate for your age.  Have a great day,  Frederic Jericho, MD

## 2023-10-15 ENCOUNTER — Telehealth: Payer: Self-pay | Admitting: Urology

## 2023-10-15 NOTE — Telephone Encounter (Signed)
 Pt daughter returned your call from 09/26/23 she stated it was concerning more options other then the Botox treatments. She stated she spoke to [facility] and he was looking into further options to see if her insurance would approve. Please Advise.

## 2023-10-16 NOTE — Assessment & Plan Note (Signed)
 Managed by urology.  Has received Botox injections in the past.  Nocturia may be worsened by her tendency to drink water to help with leg pain at night.

## 2023-10-16 NOTE — Assessment & Plan Note (Signed)
 Currently taking Zetia atorvastatin and clopidogrel.  Follows with neurology.  Continue current management.

## 2023-10-17 NOTE — Telephone Encounter (Signed)
 Left detailed msg regarding PTNS. No PA required with [facility]. Pt can be scheduled. Asked pt to return call.

## 2023-10-20 ENCOUNTER — Ambulatory Visit: Payer: Medicare HMO

## 2023-10-20 DIAGNOSIS — G459 Transient cerebral ischemic attack, unspecified: Secondary | ICD-10-CM | POA: Diagnosis not present

## 2023-10-20 LAB — CUP PACEART REMOTE DEVICE CHECK
Date Time Interrogation Session: 20250330231230
Implantable Pulse Generator Implant Date: 20231115

## 2023-10-22 ENCOUNTER — Telehealth: Payer: Self-pay | Admitting: Urology

## 2023-10-22 NOTE — Addendum Note (Signed)
 Addended by: Geralyn Flash D on: 10/22/2023 12:57 PM   Modules accepted: Orders

## 2023-10-22 NOTE — Progress Notes (Signed)
 [facility] Summary Report / Loop Recorder

## 2023-10-22 NOTE — Telephone Encounter (Signed)
 Pt called to make you aware she is on blood thinners she was wondering if this would compromise any of her upcoming procedures. Pt would like a phone call back at your earliest convince. Thanks.

## 2023-10-23 NOTE — Telephone Encounter (Signed)
 Spoke w pt, she is concerned about her Plavix. Pt states she will be on the medication for 6 months. Please advise.

## 2023-10-29 ENCOUNTER — Telehealth: Payer: Self-pay | Admitting: Urology

## 2023-10-29 NOTE — Telephone Encounter (Signed)
 Pt called about if she can do the PTNS  being on the plavix and about the paperwork that suppose to be sent out for her to fill out. Can you please advise. Thank You.

## 2023-10-29 NOTE — Telephone Encounter (Signed)
 Returned pt's call, all questions answered.

## 2023-11-04 ENCOUNTER — Telehealth: Payer: Self-pay

## 2023-11-04 NOTE — Telephone Encounter (Signed)
 Received call from [facility] requesting instructions on plavix bieng held for injection. They will fax form to Camilo Cella, NP as she saw last. Also reviewed Camilo Cella My chart message to patient.

## 2023-11-05 ENCOUNTER — Ambulatory Visit: Admitting: Urology

## 2023-11-05 DIAGNOSIS — N3941 Urge incontinence: Secondary | ICD-10-CM

## 2023-11-05 DIAGNOSIS — N3281 Overactive bladder: Secondary | ICD-10-CM | POA: Diagnosis not present

## 2023-11-05 NOTE — Patient Instructions (Signed)
Tracking Your Bladder Symptoms   Patient Name:___________________________________________________  Week Starting:____________________________________   Day Daytime  Voids Nighttime  Voids Urgency for the Day (none, mild, strong, severe) Number of Accidents/ Leaks Beverages Comments  Monday        Tuesday        Wednesday        Thursday        Friday        Saturday        Sunday         This week my symptoms were:  O much better O better O the same O worse

## 2023-11-05 NOTE — Progress Notes (Signed)
 PTNS  Session # 1  Health & Social Factors: NONE Caffeine: 1 per day Alcohol: 0 Daytime voids #per day: 4-5 Night-time voids #per night: 4-5 Urgency: Strong Incontinence Episodes #per day: 0 Ankle used: Right Treatment Setting: 8 Feeling/ Response: Sensory Comments: Pt tolerated well, no complications noted. Consent signed and questions answered.  Performed By: Everlina Hock CMA  Follow Up: RTC next week for PTNS #2.

## 2023-11-11 ENCOUNTER — Other Ambulatory Visit: Payer: Self-pay | Admitting: Internal Medicine
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
## 2023-11-12 ENCOUNTER — Ambulatory Visit: Payer: Self-pay | Admitting: Internal Medicine

## 2023-11-12 ENCOUNTER — Ambulatory Visit: Admitting: Urology

## 2023-11-12 DIAGNOSIS — N3281 Overactive bladder: Secondary | ICD-10-CM | POA: Diagnosis not present

## 2023-11-12 NOTE — Progress Notes (Signed)
   Established Patient Office Visit  Subjective   Patient ID: Cheryl Martin, female    DOB: 1931-02-04  Age: 89 y.o. MRN: [id number]  Chief Complaint  Patient presents with   PTNS    HPI    ROS    Objective:     There were no vitals taken for this visit.   Physical Exam   No results found for any visits on 11/12/23.    The ASCVD Risk score (Arnett DK, et al., 2019) failed to calculate for the following reasons:   The 2019 ASCVD risk score is only valid for ages 49 to 57   Risk score cannot be calculated because patient has a medical history suggesting prior/existing ASCVD    Assessment & Plan:   Problem List Items Addressed This Visit   None Visit Diagnoses       Overactive bladder    -  Primary   Relevant Orders   PTNS-Percutaneous Tibial Nerve Stimulati       No follow-ups on file.    Leonard Raker, LPN

## 2023-11-12 NOTE — Telephone Encounter (Signed)
 E2C2 Pulmonary Triage - Initial Assessment Questions "Chief Complaint (e.g., cough, sob, wheezing, fever, chills, sweat or additional symptoms) *Go to specific symptom protocol after initial questions. Patient calling with concerns of continuous runny nose. Patient states this is how pneumonia starts for her. Endorses that she is very susceptible. Patient states she typically will take Erythromycin. Patient takes she was in the yard yesterday and developed a runny nose. Patient denies any other symptoms currently-no sob, wheezing, chills, fever or any other symptoms besides runny nose. Patient states she does have some Erythromycin at home and did take two tablets yesterday.  Patient is wanting to speak to Dr.Wert about using the Erythromycin. Patient was started on Plavix  back in January 2025 and patient is concerned for possible interaction. Patient denies any other symptoms besides runny nose. Patient is asking for a follow up phone call.  "How long have symptoms been present?" Started yesterday  Have you tested for COVID or Flu? Note: If not, ask patient if a home test can be taken. If so, instruct patient to call back for positive results. No  MEDICINES:   "Have you used any OTC meds to help with symptoms?" No If yes, ask "What medications?"   "Have you used your inhalers/maintenance medication?" No If yes, "What medications?"   If inhaler, ask "How many puffs and how often?" Note: Review instructions on medication in the chart.   OXYGEN: "Do you wear supplemental oxygen?" No If yes, "How many liters are you supposed to use?"   "Do you monitor your oxygen levels?" No If yes, "What is your reading (oxygen level) today?" Patient doesn't monitor her oxygen levels.   "What is your usual oxygen saturation reading?"  (Note: Pulmonary O2 sats should be 90% or greater)    Copied from CRM [id number]. Topic: Clinical - Red Word Triage >> Nov 12, 2023  8:59 AM Rosamond Comes wrote: Red Word  that prompted transfer to Nurse Triage:  patient calling in has  pneumonia, patient asking if it's ok to Erythromycin. Patient is asking to talk with Dr Vernestine Gondola at [facility] Reason for Disposition  [1] Lots of yellow or green discharge from nose AND [2] present > 3 days  Answer Assessment - Initial Assessment Questions 1. ONSET: "When did the nasal discharge start?"      Started yesterday-had been in the yard  2. AMOUNT: "How much discharge is there?"      Continuous drainage 3. COUGH: "Do you have a cough?" If Yes, ask: "Describe the color of your sputum" (clear, white, yellow, green)     Dry cough at times 4. RESPIRATORY DISTRESS: "Describe your breathing."      Normal breathing 5. FEVER: "Do you have a fever?" If Yes, ask: "What is your temperature, how was it measured, and when did it start?"     no 6. SEVERITY: "Overall, how bad are you feeling right now?" (e.g., doesn't interfere with normal activities, staying home from school/work, staying in bed)      Feeling okay right now 7. OTHER SYMPTOMS: "Do you have any other symptoms?" (e.g., sore throat, earache, wheezing, vomiting)     no  Answer Assessment - Initial Assessment Questions 1. SYMPTOM: "What's the main symptom you're concerned about?" (e.g., runny nose, stuffiness, sneezing, itching)     Runny nose 2. SEVERITY: "How bad is it?" "What does it keep you from doing?" (e.g., sleeping, working)      7 out of 10 3. EYES: "  Are the eyes also red, watery, and itchy?"      no 4. TRIGGER: "What pollen or other allergic substance do you think is causing the symptoms?"      Possibly but patient states runny nose is the beginning sign of PNA 5. TREATMENT: "What medicine are you using?" "What medicine worked best in the past?"     Has used Erythromycin for PNA 6. OTHER SYMPTOMS: "Do you have any other symptoms?" (e.g., coughing, difficulty breathing, wheezing)     No other  symptoms  Protocols used: Common Cold-A-AH, Nasal Allergies (Hay Fever)-A-AH

## 2023-11-12 NOTE — Progress Notes (Signed)
 PTNS  Session # 2  Health & Social Factors: No Change Caffeine: 1 Alcohol: 0 Daytime voids #per day: 5 Night-time voids #per night: 5 Urgency: Strong Incontinence Episodes #per day: 4-5 Ankle used: Left Treatment Setting: 7 Feeling/ Response: Both (toe flex and sensory) Comments:   Performed By: Nila Barth, LPN  Follow Up: 1 week

## 2023-11-12 NOTE — Patient Instructions (Signed)
Tracking Your Bladder Symptoms   Patient Name:___________________________________________________  Week Starting:____________________________________   Day Daytime  Voids Nighttime  Voids Urgency for the Day (none, mild, strong, severe) Number of Accidents/ Leaks Beverages Comments  Monday        Tuesday        Wednesday        Thursday        Friday        Saturday        Sunday         This week my symptoms were:  O much better O better O the same O worse

## 2023-11-13 ENCOUNTER — Ambulatory Visit: Payer: Self-pay

## 2023-11-13 DIAGNOSIS — J309 Allergic rhinitis, unspecified: Secondary | ICD-10-CM

## 2023-11-13 NOTE — Telephone Encounter (Signed)
Ok to call in Zpak

## 2023-11-13 NOTE — Telephone Encounter (Signed)
 1st attempt, LVM requesting return call  Copied from CRM [id number]. Topic: Clinical - Medication Question >> Nov 13, 2023  8:08 AM Isabell A wrote: Reason for CRM: Patient has questions in regard to Erythromycin. >> Nov 13, 2023  8:10 AM Isabell A wrote: Patient also taking blood thinners.

## 2023-11-13 NOTE — Telephone Encounter (Signed)
  Chief Complaint: Duplicate Call Disposition: [] ED /[] Urgent Care (no appt availability in office) / [] Appointment(In office/virtual)/ []  [facility]/ [] Home Care/ [] Refused Recommended Disposition /[] [facility]/ [x]  Follow-up with PCP Additional Notes: The patient's daughter spoke with another triager, issue is currently in the hands of the appropriate, responsible party for handling.   Reason for Disposition . Caller has already spoken with another triager and has no further questions.  Protocols used: No Contact or Duplicate Contact Call-A-AH

## 2023-11-13 NOTE — Telephone Encounter (Signed)
 Dr Waymond Hailey- please advise on msg from triage nurse with E2C2   Signed     Copied from CRM [id number]. Topic: Clinical - Medication Question >> Nov 13, 2023  8:08 AM Isabell A wrote: Reason for CRM: Patient has questions in regard to Erythromycin. >> Nov 13, 2023  8:10 AM Isabell A wrote: Patient also taking blood thinners.      Patient's daughter called to follow up on phone call yesterday about the patient's prescriptions for Erythromycin, and if it is safe to take with Plavix . Patient also needs a refill of the Erythromycin if that is still what Dr. Waymond Hailey want's her to take for when she begins to have symptoms of pneumonia, and to be able to be picked up today if possible.    Please call the patient back with a response to her questions.

## 2023-11-13 NOTE — Telephone Encounter (Signed)
 As long as mucus is clear it is very unlikely it will need abx but if so ok to call in zpak if she took it safely previously while on plavix  - if not would hold off

## 2023-11-13 NOTE — Telephone Encounter (Signed)
 Copied from CRM [id number]. Topic: Clinical - Medication Question >> Nov 13, 2023  8:08 AM Isabell A wrote: Reason for CRM: Patient has questions in regard to Erythromycin. >> Nov 13, 2023  8:10 AM Isabell A wrote: Patient also taking blood thinners.    Patient's daughter called to follow up on phone call yesterday about the patient's prescriptions for Erythromycin, and if it is safe to take with Plavix . Patient also needs a refill of the Erythromycin if that is still what Dr. Waymond Hailey want's her to take for when she begins to have symptoms of pneumonia, and to be able to be picked up today if possible.   Please call the patient back with a response to her questions.    Reason for Disposition  [1] Caller has URGENT medicine question about med that PCP or specialist prescribed AND [2] triager unable to answer question  Answer Assessment - Initial Assessment Questions 1. NAME of MEDICINE: "What medicine(s) are you calling about?"     Erythromycin and Plavix   2. QUESTION: "What is your question?" (e.g., double dose of medicine, side effect)     Should the patient be taking them together  3. PRESCRIBER: "Who prescribed the medicine?" Reason: if prescribed by specialist, call should be referred to that group.     Dr. Waymond Hailey  Protocols used: Medication Question Call-A-AH

## 2023-11-14 ENCOUNTER — Other Ambulatory Visit: Payer: Self-pay

## 2023-11-14 DIAGNOSIS — J309 Allergic rhinitis, unspecified: Secondary | ICD-10-CM

## 2023-11-14 MED ORDER — AZITHROMYCIN 250 MG PO TABS: ORAL_TABLET | ORAL | 0 refills | Status: DC

## 2023-11-14 MED ORDER — AZITHROMYCIN 250 MG PO TABS: 250.0000 mg | ORAL_TABLET | Freq: Every day | ORAL | 0 refills | Status: DC

## 2023-11-14 NOTE — Addendum Note (Signed)
 Addended by: Goldie Later on: 11/14/2023 10:10 AM   Modules accepted: Orders

## 2023-11-14 NOTE — Telephone Encounter (Signed)
 Spoke with patient and relayed message from Dr. Waymond Hailey. She already has zpak and took one tablet this morning, reports she is already feeling better. Advised her to contact the provider who prescribes her Plavix  to make sure it is ok for her to continue to take the zpak. She will contact them for advice. Nothing further needed at this time.

## 2023-11-14 NOTE — Telephone Encounter (Signed)
 ATC x1 Sheryle Donning to let her know per Dr.Wert ok to send Zpack to patient's pharmacy.Nothing else further needed.

## 2023-11-14 NOTE — Addendum Note (Signed)
 Addended by: Goldie Later on: 11/14/2023 10:00 AM   Modules accepted: Orders

## 2023-11-14 NOTE — Telephone Encounter (Signed)
 ATC x1 LVM for patient regarding prior message . Will send patient a mychart message.

## 2023-11-20 ENCOUNTER — Ambulatory Visit: Admitting: Urology

## 2023-11-20 ENCOUNTER — Encounter: Payer: Self-pay | Admitting: Urology

## 2023-11-20 DIAGNOSIS — N3281 Overactive bladder: Secondary | ICD-10-CM

## 2023-11-20 NOTE — Patient Instructions (Signed)
Tracking Your Bladder Symptoms   Patient Name:___________________________________________________  Week Starting:____________________________________   Day Daytime  Voids Nighttime  Voids Urgency for the Day (none, mild, strong, severe) Number of Accidents/ Leaks Beverages Comments  Monday        Tuesday        Wednesday        Thursday        Friday        Saturday        Sunday         This week my symptoms were:  O much better O better O the same O worse

## 2023-11-20 NOTE — Progress Notes (Signed)
 PTNS  Session # 3  Health & Social Factors: No change Caffeine: 0 Alcohol: 0 Daytime voids #per day: 4 avg Night-time voids #per night: 4 avg Urgency: mild Incontinence Episodes #per day: 0 (wears pads) Ankle used: right Treatment Setting: 14 Feeling/ Response: Toe flex and sensory Comments: pt tolerated well, no complications noted.   Performed By: Crysta CMA

## 2023-11-24 ENCOUNTER — Ambulatory Visit (INDEPENDENT_AMBULATORY_CARE_PROVIDER_SITE_OTHER): Payer: Medicare HMO

## 2023-11-24 DIAGNOSIS — G459 Transient cerebral ischemic attack, unspecified: Secondary | ICD-10-CM | POA: Diagnosis not present

## 2023-11-24 LAB — CUP PACEART REMOTE DEVICE CHECK
Date Time Interrogation Session: 20250504232245
Implantable Pulse Generator Implant Date: 20231115

## 2023-11-26 ENCOUNTER — Ambulatory Visit: Admitting: Urology

## 2023-11-26 DIAGNOSIS — M419 Scoliosis, unspecified: Secondary | ICD-10-CM | POA: Diagnosis not present

## 2023-11-26 DIAGNOSIS — N3281 Overactive bladder: Secondary | ICD-10-CM | POA: Diagnosis not present

## 2023-11-26 DIAGNOSIS — Z1330 Encounter for screening examination for mental health and behavioral disorders, unspecified: Secondary | ICD-10-CM | POA: Diagnosis not present

## 2023-11-26 DIAGNOSIS — M5416 Radiculopathy, lumbar region: Secondary | ICD-10-CM | POA: Diagnosis not present

## 2023-11-26 NOTE — Patient Instructions (Signed)
Tracking Your Bladder Symptoms   Patient Name:___________________________________________________  Week Starting:____________________________________   Day Daytime  Voids Nighttime  Voids Urgency for the Day (none, mild, strong, severe) Number of Accidents/ Leaks Beverages Comments  Monday        Tuesday        Wednesday        Thursday        Friday        Saturday        Sunday         This week my symptoms were:  O much better O better O the same O worse

## 2023-11-26 NOTE — Progress Notes (Signed)
 PTNS  Session # 4     Health & Social Factors: No Change Caffeine: 1 Alcohol: 0 Daytime voids #per day: 4 Night-time voids #per night: 4 Urgency: Mild  Incontinence Episodes #per day: 3 Ankle used: Left  Treatment Setting: 14 Feeling/ Response: Toe Flex and Feeling Comments:   Performed By: Nila Barth, LPN  Follow Up: Next week

## 2023-12-03 ENCOUNTER — Ambulatory Visit: Admitting: Urology

## 2023-12-03 DIAGNOSIS — N3281 Overactive bladder: Secondary | ICD-10-CM | POA: Diagnosis not present

## 2023-12-03 NOTE — Patient Instructions (Signed)
Tracking Your Bladder Symptoms   Patient Name:___________________________________________________  Week Starting:____________________________________   Day Daytime  Voids Nighttime  Voids Urgency for the Day (none, mild, strong, severe) Number of Accidents/ Leaks Beverages Comments  Monday        Tuesday        Wednesday        Thursday        Friday        Saturday        Sunday         This week my symptoms were:  O much better O better O the same O worse

## 2023-12-03 NOTE — Progress Notes (Addendum)
 PTNS  Session # 5  Health & Social Factors: No Change Caffeine: 1 Alcohol: 0 Daytime voids #per day: 5 Night-time voids #per night: 3 Urgency: None Incontinence Episodes #per day: 3 Ankle used: Right   Treatment Setting: 13 Feeling/ Response: Both Comments:   Performed By: Nila Barth, LPN  I agree with the above note, having attended to the patient that day.

## 2023-12-04 NOTE — Progress Notes (Signed)
 [facility] Summary Report / Loop Recorder

## 2023-12-10 ENCOUNTER — Ambulatory Visit: Admitting: Urology

## 2023-12-10 DIAGNOSIS — N3941 Urge incontinence: Secondary | ICD-10-CM | POA: Diagnosis not present

## 2023-12-10 NOTE — Progress Notes (Addendum)
 PTNS  Session # 6  Health & Social Factors: Change Caffeine: 1 Alcohol: 0 Daytime voids #per day: 3 Night-time voids #per night: 3 Urgency: Mild Incontinence Episodes #per day: 2 Ankle used: Left Treatment Setting: 12 Feeling/ Response: Feeling Comments: Pt states she has been going to the bathroom less. She also has noticed that she is urinating less in amounts each visit to the bathroom.  Performed By: C.Candee Cha, LPN  I attended the patient's visit

## 2023-12-10 NOTE — Patient Instructions (Signed)
Tracking Your Bladder Symptoms   Patient Name:___________________________________________________  Week Starting:____________________________________   Day Daytime  Voids Nighttime  Voids Urgency for the Day (none, mild, strong, severe) Number of Accidents/ Leaks Beverages Comments  Monday        Tuesday        Wednesday        Thursday        Friday        Saturday        Sunday         This week my symptoms were:  O much better O better O the same O worse

## 2023-12-16 ENCOUNTER — Encounter: Payer: Self-pay | Admitting: Family Medicine

## 2023-12-17 ENCOUNTER — Ambulatory Visit: Admitting: Urology

## 2023-12-17 ENCOUNTER — Other Ambulatory Visit: Payer: Self-pay | Admitting: Family Medicine

## 2023-12-17 DIAGNOSIS — N3281 Overactive bladder: Secondary | ICD-10-CM

## 2023-12-17 MED ORDER — ROSUVASTATIN CALCIUM 20 MG PO TABS: 20.0000 mg | ORAL_TABLET | Freq: Every day | ORAL | 3 refills | Status: AC

## 2023-12-17 NOTE — Progress Notes (Signed)
 PTNS  Session # 7  Health & Social Factors: Change. Decrease in frequency. Increase in urine output. Caffeine: 1 Alcohol: 0 Daytime voids #per day: 3 Night-time voids #per night: 4 Urgency: Mild Incontinence Episodes #per day: 0 Ankle used: Right Treatment Setting: 6 Feeling/ Response: Both Comments: Pt is pleased with positive results  Performed By: C. Candee Cha, LPN   Follow Up: 1 week  I attended this visit and agree with above note

## 2023-12-17 NOTE — Patient Instructions (Signed)
Tracking Your Bladder Symptoms   Patient Name:___________________________________________________  Week Starting:____________________________________   Day Daytime  Voids Nighttime  Voids Urgency for the Day (none, mild, strong, severe) Number of Accidents/ Leaks Beverages Comments  Monday        Tuesday        Wednesday        Thursday        Friday        Saturday        Sunday         This week my symptoms were:  O much better O better O the same O worse

## 2023-12-24 ENCOUNTER — Ambulatory Visit: Admitting: Urology

## 2023-12-25 ENCOUNTER — Ambulatory Visit (INDEPENDENT_AMBULATORY_CARE_PROVIDER_SITE_OTHER)

## 2023-12-25 DIAGNOSIS — G459 Transient cerebral ischemic attack, unspecified: Secondary | ICD-10-CM

## 2023-12-25 LAB — CUP PACEART REMOTE DEVICE CHECK
Date Time Interrogation Session: 20250604231958
Implantable Pulse Generator Implant Date: 20231115

## 2023-12-26 ENCOUNTER — Ambulatory Visit
Admission: RE | Admit: 2023-12-26 | Discharge: 2023-12-26 | Disposition: A | Discharge status: Home / Self Care | Source: Ambulatory Visit | Attending: Emergency Medicine | Admitting: Emergency Medicine

## 2023-12-26 ENCOUNTER — Ambulatory Visit (INDEPENDENT_AMBULATORY_CARE_PROVIDER_SITE_OTHER): Admitting: Radiology

## 2023-12-26 VITALS — BP 108/63 | HR 57 | Temp 97.5°F | Resp 16

## 2023-12-26 DIAGNOSIS — M7732 Calcaneal spur, left foot: Secondary | ICD-10-CM | POA: Diagnosis not present

## 2023-12-26 DIAGNOSIS — M79672 Pain in left foot: Secondary | ICD-10-CM | POA: Diagnosis not present

## 2023-12-26 DIAGNOSIS — M7742 Metatarsalgia, left foot: Secondary | ICD-10-CM | POA: Diagnosis not present

## 2023-12-26 DIAGNOSIS — M2012 Hallux valgus (acquired), left foot: Secondary | ICD-10-CM | POA: Diagnosis not present

## 2023-12-26 NOTE — Discharge Instructions (Signed)
 Thank you for letting me be part of your care today.  X-rays are unremarkable.  You can use topical Voltaren  gel to the area pain.  Wearing metatarsal pads can help relieve some pressure.  Follow-up with orthopedic if symptoms persist.

## 2023-12-26 NOTE — ED Triage Notes (Signed)
 Pt states left ankle/foot pain that radiates up to her knee for the past 3 days.  States she took tylenol  and tumeric with some relief.
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
## 2023-12-27 DIAGNOSIS — M5416 Radiculopathy, lumbar region: Secondary | ICD-10-CM | POA: Diagnosis not present

## 2023-12-27 DIAGNOSIS — M419 Scoliosis, unspecified: Secondary | ICD-10-CM | POA: Diagnosis not present

## 2023-12-27 DIAGNOSIS — M4186 Other forms of scoliosis, lumbar region: Secondary | ICD-10-CM | POA: Diagnosis not present

## 2023-12-27 DIAGNOSIS — M48061 Spinal stenosis, lumbar region without neurogenic claudication: Secondary | ICD-10-CM | POA: Diagnosis not present

## 2023-12-27 DIAGNOSIS — M4807 Spinal stenosis, lumbosacral region: Secondary | ICD-10-CM | POA: Diagnosis not present

## 2023-12-31 ENCOUNTER — Ambulatory Visit: Admitting: Urology

## 2023-12-31 DIAGNOSIS — N3281 Overactive bladder: Secondary | ICD-10-CM | POA: Diagnosis not present

## 2023-12-31 NOTE — Progress Notes (Signed)
 PTNS  Session # 8  Health & Social Factors: Change Caffeine: 0 Alcohol: 0 Daytime voids #per day: 3 Night-time voids #per night: 3 Urgency: Mild  Incontinence Episodes #per day: 0 Ankle used: Left Treatment Setting: 12 Feeling/ Response: Feeling Comments: Pt was super happy, she has not had any nightly incontinence this week! Pt also stated that her urgency has gotten much better.;  Performed By: C. Candee Cha, LPN   Follow Up: 2 weeks, pt out of town next week

## 2023-12-31 NOTE — Patient Instructions (Signed)
Tracking Your Bladder Symptoms   Patient Name:___________________________________________________  Week Starting:____________________________________   Day Daytime  Voids Nighttime  Voids Urgency for the Day (none, mild, strong, severe) Number of Accidents/ Leaks Beverages Comments  Monday        Tuesday        Wednesday        Thursday        Friday        Saturday        Sunday         This week my symptoms were:  O much better O better O the same O worse

## 2024-01-01 DIAGNOSIS — M5416 Radiculopathy, lumbar region: Secondary | ICD-10-CM | POA: Diagnosis not present

## 2024-01-01 DIAGNOSIS — M4316 Spondylolisthesis, lumbar region: Secondary | ICD-10-CM | POA: Diagnosis not present

## 2024-01-02 ENCOUNTER — Ambulatory Visit: Payer: Self-pay | Admitting: Cardiology

## 2024-01-12 NOTE — Progress Notes (Signed)
 [facility] Summary Report / Loop Recorder

## 2024-01-14 ENCOUNTER — Ambulatory Visit: Admitting: Urology

## 2024-01-21 ENCOUNTER — Ambulatory Visit: Admitting: Urology

## 2024-01-21 DIAGNOSIS — N3281 Overactive bladder: Secondary | ICD-10-CM | POA: Diagnosis not present

## 2024-01-21 DIAGNOSIS — N3941 Urge incontinence: Secondary | ICD-10-CM

## 2024-01-21 NOTE — Progress Notes (Signed)
 PTNS  Session # 9  Health & Social Factors: no changes Caffeine: 0 Alcohol: 0 Daytime voids #per day: 4 Night-time voids #per night: 2 Urgency: Mild Incontinence Episodes #per day: 0 Ankle used: right Treatment Setting: 12 Feeling/ Response: Toe flex & sensory Comments: Pt tolerated well, no complications noted.   Performed By: Roselee CMA  Follow Up: RTC in 1 week for PTNS #10.

## 2024-01-21 NOTE — Patient Instructions (Signed)
Tracking Your Bladder Symptoms   Patient Name:___________________________________________________  Week Starting:____________________________________   Day Daytime  Voids Nighttime  Voids Urgency for the Day (none, mild, strong, severe) Number of Accidents/ Leaks Beverages Comments  Monday        Tuesday        Wednesday        Thursday        Friday        Saturday        Sunday         This week my symptoms were:  O much better O better O the same O worse

## 2024-01-22 ENCOUNTER — Ambulatory Visit: Payer: Medicare HMO

## 2024-01-22 DIAGNOSIS — Z0000 Encounter for general adult medical examination without abnormal findings: Secondary | ICD-10-CM | POA: Diagnosis not present

## 2024-01-22 NOTE — Patient Instructions (Signed)
 [facility] , Thank you for taking time out of your busy schedule to complete your Annual Wellness Visit with me. I enjoyed our conversation and look forward to speaking with you again next year. I, as well as your care team,  appreciate your ongoing commitment to your health goals. Please review the following plan we discussed and let me know if I can assist you in the future. Your Game plan/ To Do List    Referrals: If you haven't heard from the office you've been referred to, please reach out to them at the phone provided.  N/a Follow up Visits: Next Medicare AWV with our clinical staff: 03/10/2025 at 3:00   Have you seen your provider in the last 6 months (3 months if uncontrolled diabetes)? Yes Next Office Visit with your provider: 04/15/2024 at 10:10  Clinician Recommendations:  Aim for 30 minutes of exercise or brisk walking, 6-8 glasses of water, and 5 servings of fruits and vegetables each day.       This is a list of the screening recommended for you and due dates:  Health Maintenance  Topic Date Due   Zoster (Shingles) Vaccine (1 of 2) Never done   COVID-19 Vaccine (4 - 2024-25 season) 03/23/2023   Pneumococcal Vaccine for age over 7 (1 of 2 - PCV) 04/06/2024*   Flu Shot  02/20/2024   Medicare Annual Wellness Visit  01/21/2025   DTaP/Tdap/Td vaccine (2 - Td or Tdap) 03/14/2026   DEXA scan (bone density measurement)  Completed   Hepatitis B Vaccine  Aged Out   HPV Vaccine  Aged Out   Meningitis B Vaccine  Aged Out  *Topic was postponed. The date shown is not the original due date.    Advanced directives: (In Chart) A copy of your advanced directives are scanned into your chart should your provider ever need it. Advance Care Planning is important because it:  [x]  Makes sure you receive the medical care that is consistent with your values, goals, and preferences  [x]  It provides guidance to your family and loved ones and reduces their decisional burden about whether or not  they are making the right decisions based on your wishes.  Follow the link provided in your after visit summary or read over the paperwork we have mailed to you to help you started getting your Advance Directives in place. If you need assistance in completing these, please reach out to us  so that we can help you!  See attachments for Preventive Care and Fall Prevention Tips.
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
## 2024-01-26 ENCOUNTER — Ambulatory Visit (INDEPENDENT_AMBULATORY_CARE_PROVIDER_SITE_OTHER)

## 2024-01-26 DIAGNOSIS — G459 Transient cerebral ischemic attack, unspecified: Secondary | ICD-10-CM

## 2024-01-26 LAB — CUP PACEART REMOTE DEVICE CHECK
Date Time Interrogation Session: 20250706233108
Implantable Pulse Generator Implant Date: 20231115

## 2024-01-28 ENCOUNTER — Ambulatory Visit: Admitting: Urology

## 2024-02-04 ENCOUNTER — Ambulatory Visit: Admitting: Urology

## 2024-02-04 DIAGNOSIS — N3941 Urge incontinence: Secondary | ICD-10-CM

## 2024-02-04 NOTE — Patient Instructions (Signed)
Tracking Your Bladder Symptoms   Patient Name:___________________________________________________  Week Starting:____________________________________   Day Daytime  Voids Nighttime  Voids Urgency for the Day (none, mild, strong, severe) Number of Accidents/ Leaks Beverages Comments  Monday        Tuesday        Wednesday        Thursday        Friday        Saturday        Sunday         This week my symptoms were:  O much better O better O the same O worse

## 2024-02-04 NOTE — Progress Notes (Addendum)
 PTNS  Session # 10  Health & Social Factors: Change Caffeine: 1 Alcohol: 0 Daytime voids #per day: 5 Night-time voids #per night: 2-3 Urgency: Strong Incontinence Episodes #per day: 2 Ankle used: Left  Treatment Setting: 7 Feeling/ Response: Feeling Comments: Pt feels as though she has had some improvement over all  Performed By: C. Koleen, LPN

## 2024-02-08 ENCOUNTER — Ambulatory Visit
Admission: EM | Admit: 2024-02-08 | Discharge: 2024-02-08 | Disposition: A | Discharge status: Home / Self Care | Attending: Physician Assistant | Admitting: Physician Assistant

## 2024-02-08 ENCOUNTER — Other Ambulatory Visit: Payer: Self-pay | Admitting: Neurology

## 2024-02-08 DIAGNOSIS — R300 Dysuria: Secondary | ICD-10-CM

## 2024-02-08 DIAGNOSIS — R21 Rash and other nonspecific skin eruption: Secondary | ICD-10-CM | POA: Diagnosis not present

## 2024-02-08 LAB — POCT URINALYSIS DIP (MANUAL ENTRY)
Bilirubin, UA: NEGATIVE
Glucose, UA: NEGATIVE mg/dL
Ketones, POC UA: NEGATIVE mg/dL
Leukocytes, UA: NEGATIVE
Nitrite, UA: NEGATIVE
Spec Grav, UA: 1.02 (ref 1.010–1.025)
Urobilinogen, UA: 0.2 U/dL
pH, UA: 6.5 (ref 5.0–8.0)

## 2024-02-08 MED ORDER — NYSTATIN 100000 UNIT/GM EX CREA
1.0000 | TOPICAL_CREAM | Freq: Two times a day (BID) | CUTANEOUS | 0 refills | Status: AC
Start: 2024-02-08 — End: ?
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
## 2024-02-08 NOTE — ED Triage Notes (Signed)
 I am having painful urination about a week, it is actually a chafed area due to wearing depends for leakage. No fever.

## 2024-02-09 ENCOUNTER — Ambulatory Visit (HOSPITAL_COMMUNITY): Payer: Self-pay

## 2024-02-09 DIAGNOSIS — M4316 Spondylolisthesis, lumbar region: Secondary | ICD-10-CM | POA: Diagnosis not present

## 2024-02-09 DIAGNOSIS — M5416 Radiculopathy, lumbar region: Secondary | ICD-10-CM | POA: Diagnosis not present

## 2024-02-09 LAB — URINE CULTURE: Culture: NO GROWTH

## 2024-02-11 ENCOUNTER — Telehealth: Payer: Self-pay

## 2024-02-11 ENCOUNTER — Ambulatory Visit: Admitting: Urology

## 2024-02-11 DIAGNOSIS — N3281 Overactive bladder: Secondary | ICD-10-CM | POA: Diagnosis not present

## 2024-02-11 NOTE — Progress Notes (Signed)
 PTNS  Session # 11  Health & Social Factors: Change Caffeine: 1 Alcohol: 0 Daytime voids #per day: 5 Night-time voids #per night: 3 Urgency: Severe Incontinence Episodes #per day: 3 Ankle used: Right Treatment Setting: 11 Feeling/ Response: Feeling Comments: Pt c/o now soaking 3 large pads during the night. Pt requested referral to PT for bladder.   Performed By: C. Koleen, LPN

## 2024-02-11 NOTE — Progress Notes (Signed)
 [facility] Summary Report / Loop Recorder

## 2024-02-11 NOTE — Telephone Encounter (Signed)
 Pt came in today for PTNS #11. Pt stated over the last week or so she has started soaking 3 large pads during the night. Pt requested bladder exercises. Reinforced with pt that PT has a section for bladder training. Pt requested a referral. Pt doesn't feel like PTNS has helped as she had hoped. Please advise.

## 2024-02-11 NOTE — Patient Instructions (Signed)
Tracking Your Bladder Symptoms   Patient Name:___________________________________________________  Week Starting:____________________________________   Day Daytime  Voids Nighttime  Voids Urgency for the Day (none, mild, strong, severe) Number of Accidents/ Leaks Beverages Comments  Monday        Tuesday        Wednesday        Thursday        Friday        Saturday        Sunday         This week my symptoms were:  O much better O better O the same O worse

## 2024-02-12 NOTE — Telephone Encounter (Signed)
 Can you help me with this? Im not sure I know how to put in a pelvic floor therapy referral.

## 2024-02-15 ENCOUNTER — Other Ambulatory Visit: Payer: Self-pay | Admitting: Urology

## 2024-02-15 DIAGNOSIS — N3941 Urge incontinence: Secondary | ICD-10-CM

## 2024-02-15 DIAGNOSIS — N3281 Overactive bladder: Secondary | ICD-10-CM

## 2024-02-18 ENCOUNTER — Encounter: Payer: Self-pay | Admitting: Urology

## 2024-02-18 ENCOUNTER — Ambulatory Visit: Admitting: Urology

## 2024-02-18 DIAGNOSIS — N3281 Overactive bladder: Secondary | ICD-10-CM | POA: Diagnosis not present

## 2024-02-18 NOTE — Patient Instructions (Signed)
Tracking Your Bladder Symptoms   Patient Name:___________________________________________________  Week Starting:____________________________________   Day Daytime  Voids Nighttime  Voids Urgency for the Day (none, mild, strong, severe) Number of Accidents/ Leaks Beverages Comments  Monday        Tuesday        Wednesday        Thursday        Friday        Saturday        Sunday         This week my symptoms were:  O much better O better O the same O worse

## 2024-02-18 NOTE — Progress Notes (Signed)
 PTNS  Session # 12  Health & Social Factors: Change/No Change- both Caffeine: 1 Alcohol: 0 Daytime voids #per day: 6-8 Night-time voids #per night: 3 Urgency: Mild/Strong Incontinence Episodes #per day: 6 Ankle used: Left  Treatment Setting: 19 Feeling/ Response: Both Comments: Pt was very weepy today. Pt voiced concern of always being in pain and incontinence. Pt states she feels like her urgency is more controlled, but that's it.   Performed By: C. Koleen, LPN   Follow Up: 1 mo with dr. Visit

## 2024-02-21 ENCOUNTER — Ambulatory Visit: Payer: Self-pay | Admitting: Cardiology

## 2024-02-26 ENCOUNTER — Ambulatory Visit: Payer: Self-pay | Admitting: Cardiology

## 2024-02-26 ENCOUNTER — Ambulatory Visit

## 2024-02-26 DIAGNOSIS — G459 Transient cerebral ischemic attack, unspecified: Secondary | ICD-10-CM | POA: Diagnosis not present

## 2024-02-26 LAB — CUP PACEART REMOTE DEVICE CHECK
Date Time Interrogation Session: 20250806232518
Implantable Pulse Generator Implant Date: 20231115

## 2024-03-01 DIAGNOSIS — H04123 Dry eye syndrome of bilateral lacrimal glands: Secondary | ICD-10-CM | POA: Diagnosis not present

## 2024-03-01 DIAGNOSIS — H18593 Other hereditary corneal dystrophies, bilateral: Secondary | ICD-10-CM | POA: Diagnosis not present

## 2024-03-01 DIAGNOSIS — H31101 Choroidal degeneration, unspecified, right eye: Secondary | ICD-10-CM | POA: Diagnosis not present

## 2024-03-01 DIAGNOSIS — H35373 Puckering of macula, bilateral: Secondary | ICD-10-CM | POA: Diagnosis not present

## 2024-03-01 DIAGNOSIS — Z961 Presence of intraocular lens: Secondary | ICD-10-CM | POA: Diagnosis not present

## 2024-03-03 ENCOUNTER — Other Ambulatory Visit: Payer: Self-pay | Admitting: Internal Medicine

## 2024-03-03 ENCOUNTER — Ambulatory Visit
Admission: EM | Admit: 2024-03-03 | Discharge: 2024-03-03 | Disposition: A | Discharge status: Home / Self Care | Attending: Family Medicine | Admitting: Family Medicine

## 2024-03-03 ENCOUNTER — Ambulatory Visit (INDEPENDENT_AMBULATORY_CARE_PROVIDER_SITE_OTHER)

## 2024-03-03 ENCOUNTER — Encounter: Payer: Self-pay | Admitting: Emergency Medicine

## 2024-03-03 DIAGNOSIS — K449 Diaphragmatic hernia without obstruction or gangrene: Secondary | ICD-10-CM | POA: Diagnosis not present

## 2024-03-03 DIAGNOSIS — R051 Acute cough: Secondary | ICD-10-CM | POA: Diagnosis not present

## 2024-03-03 DIAGNOSIS — J309 Allergic rhinitis, unspecified: Secondary | ICD-10-CM

## 2024-03-03 DIAGNOSIS — R5383 Other fatigue: Secondary | ICD-10-CM | POA: Diagnosis not present

## 2024-03-03 DIAGNOSIS — R509 Fever, unspecified: Secondary | ICD-10-CM | POA: Diagnosis not present

## 2024-03-03 DIAGNOSIS — U071 COVID-19: Secondary | ICD-10-CM

## 2024-03-03 DIAGNOSIS — R059 Cough, unspecified: Secondary | ICD-10-CM | POA: Diagnosis not present

## 2024-03-03 LAB — POC COVID19/FLU A&B COMBO
Covid Antigen, POC: POSITIVE — AB
Influenza A Antigen, POC: NEGATIVE
Influenza B Antigen, POC: NEGATIVE

## 2024-03-03 MED ORDER — HYDROCODONE BIT-HOMATROP MBR 5-1.5 MG/5ML PO SOLN: 2.5000 mL | Freq: Four times a day (QID) | ORAL | 0 refills | Status: AC | PRN

## 2024-03-03 NOTE — ED Triage Notes (Signed)
 Pt here with her daughter and reports nasal congestion, fevers, productive cough, fatigue, and sore throat that started last night. Max temp: 100.1 earlier today. Pt has Z-Pak today and yesterday (regularly prescribed by PCP due to recurrent pneumonia). No sick contacts. No testing completed at home.

## 2024-03-03 NOTE — Discharge Instructions (Signed)
 Be aware, your cough medication may cause drowsiness. Please do not drive, operate heavy machinery or make important decisions while on this medication, it can cloud your judgement.

## 2024-03-04 ENCOUNTER — Ambulatory Visit: Payer: Self-pay | Admitting: Internal Medicine

## 2024-03-04 ENCOUNTER — Ambulatory Visit (HOSPITAL_COMMUNITY): Payer: Self-pay

## 2024-03-04 NOTE — Telephone Encounter (Signed)
 FYI Only or Action Required?: Action required by provider: medication refill request.  Patient is followed in Pulmonology for bronchiectasis and nodules, last seen on 04/24/2023 by Darlean Ozell NOVAK, MD.  Called Nurse Triage reporting Medication Refill.  Symptoms began several days ago.  Interventions attempted: Other: was seen at urgent care.  Symptoms are: unchanged.  Triage Disposition: Call PCP When Office is Open  Patient/caregiver understands and will follow disposition?: Yes       Copied from CRM #[id number]. Topic: Clinical - Medical Advice >> Mar 04, 2024  1:01 PM Nathanel DEL wrote: Reason for CRM: Daughter Niels calling to follow up on a request for a zpak.  She has covid and thought Dr Darlean was going to call it in.        Reason for Disposition  [1] Prescription refill request for NON-ESSENTIAL medicine (i.e., no harm to patient if med not taken) AND [2] triager unable to refill per department policy  Answer Assessment - Initial Assessment Questions Patient believed she was getting pneumonia and started taking a Z-Pak. Patient's daughter is calling to request a refill to have on hand which Dr. Darlean has done in the past. Please advise.       1. DRUG NAME: What medicine do you need to have refilled?     Z-Pak 2. REFILLS REMAINING: How many refills are remaining? Notes: The label on the medicine or pill bottle will show how many refills are remaining. If there are no refills remaining, then a renewal may be needed.     None 4. PRESCRIBER: Who prescribed it? Note: The prescribing doctor or group is responsible for refill approvals..     Dr. Darlean  5. PHARMACY: Have you contacted your pharmacy (drugstore)? Note: Some pharmacies will contact the doctor (or NP/PA).       [id number] [address] [place], [place] - [address] [place] [address]   6. SYMPTOMS: Do you have any symptoms?     Patient was diagnosed with COVID  yesterday  Protocols used: Medication Refill and Renewal Call-A-AH
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
## 2024-03-09 MED ORDER — AZITHROMYCIN 250 MG PO TABS: 250.0000 mg | ORAL_TABLET | Freq: Every day | ORAL | 11 refills | Status: AC

## 2024-03-09 NOTE — Addendum Note (Signed)
 Addended byBETHA FRIES, Anysa Tacey A on: 03/09/2024 11:22 AM   Modules accepted: Orders

## 2024-03-09 NOTE — Telephone Encounter (Signed)
 Rx has been sent and patient is aware. Nothing further needed.

## 2024-03-15 DIAGNOSIS — M5416 Radiculopathy, lumbar region: Secondary | ICD-10-CM | POA: Diagnosis not present

## 2024-03-15 DIAGNOSIS — M4310 Spondylolisthesis, site unspecified: Secondary | ICD-10-CM | POA: Diagnosis not present

## 2024-03-25 ENCOUNTER — Encounter: Payer: Self-pay | Admitting: Urology

## 2024-03-25 ENCOUNTER — Ambulatory Visit: Admitting: Urology

## 2024-03-25 VITALS — BP 132/97 | HR 121 | Ht 63.0 in | Wt 119.0 lb

## 2024-03-25 DIAGNOSIS — N3941 Urge incontinence: Secondary | ICD-10-CM | POA: Diagnosis not present

## 2024-03-25 DIAGNOSIS — N3281 Overactive bladder: Secondary | ICD-10-CM

## 2024-03-25 LAB — URINALYSIS, ROUTINE W REFLEX MICROSCOPIC
Bilirubin, UA: NEGATIVE
Glucose, UA: NEGATIVE
Leukocytes,UA: NEGATIVE
Nitrite, UA: NEGATIVE
RBC, UA: NEGATIVE
Specific Gravity, UA: 1.02 (ref 1.005–1.030)
Urobilinogen, Ur: 0.2 mg/dL (ref 0.2–1.0)
pH, UA: 5.5 (ref 5.0–7.5)

## 2024-03-25 LAB — MICROSCOPIC EXAMINATION
Casts: POSITIVE /LPF — AB
Mucus, UA: POSITIVE — AB
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
## 2024-03-29 ENCOUNTER — Ambulatory Visit: Payer: Self-pay | Admitting: Cardiology

## 2024-03-29 ENCOUNTER — Ambulatory Visit

## 2024-03-29 DIAGNOSIS — G459 Transient cerebral ischemic attack, unspecified: Secondary | ICD-10-CM

## 2024-03-29 LAB — CUP PACEART REMOTE DEVICE CHECK
Date Time Interrogation Session: 20250906231602
Implantable Pulse Generator Implant Date: 20231115

## 2024-04-04 ENCOUNTER — Other Ambulatory Visit: Payer: Self-pay | Admitting: Internal Medicine

## 2024-04-07 DIAGNOSIS — N3941 Urge incontinence: Secondary | ICD-10-CM | POA: Diagnosis not present

## 2024-04-07 DIAGNOSIS — M6289 Other specified disorders of muscle: Secondary | ICD-10-CM | POA: Diagnosis not present

## 2024-04-07 DIAGNOSIS — R351 Nocturia: Secondary | ICD-10-CM | POA: Diagnosis not present

## 2024-04-07 DIAGNOSIS — M6281 Muscle weakness (generalized): Secondary | ICD-10-CM | POA: Diagnosis not present

## 2024-04-07 DIAGNOSIS — R3915 Urgency of urination: Secondary | ICD-10-CM | POA: Diagnosis not present

## 2024-04-08 NOTE — Progress Notes (Signed)
 Remote Loop Recorder Transmission

## 2024-04-15 ENCOUNTER — Encounter: Payer: Self-pay | Admitting: Family Medicine

## 2024-04-15 ENCOUNTER — Ambulatory Visit (INDEPENDENT_AMBULATORY_CARE_PROVIDER_SITE_OTHER): Admitting: Family Medicine

## 2024-04-15 VITALS — BP 110/68 | HR 62 | Ht 63.0 in | Wt 123.1 lb

## 2024-04-15 DIAGNOSIS — L249 Irritant contact dermatitis, unspecified cause: Secondary | ICD-10-CM | POA: Diagnosis not present

## 2024-04-15 DIAGNOSIS — M778 Other enthesopathies, not elsewhere classified: Secondary | ICD-10-CM | POA: Diagnosis not present

## 2024-04-15 DIAGNOSIS — R32 Unspecified urinary incontinence: Secondary | ICD-10-CM | POA: Diagnosis not present

## 2024-04-15 NOTE — Patient Instructions (Addendum)
 It was nice to see you today,  We addressed the following topics today: -I am sending in a referral to someone who may be able to help determine how much you would have to pay for more expensive medications for overactive bladder that you have not been able to afford in the past - I have provided you with some exercises you can do for your shoulder.  I would recommend using heat, topical pain relievers and avoid heavy lifting until your shoulder improves. - I would try Vaseline as a barrier cream.  Apply it after every time you have to change your incontinence pads.  Have a great day,  Rolan Slain, MD

## 2024-04-15 NOTE — Progress Notes (Signed)
 Established Patient Office Visit  Subjective   Patient ID: Cheryl Martin, female    DOB: 02-04-1931  Age: 89 y.o. MRN: [id number]  Chief Complaint  Patient presents with   Medical Management of Chronic Issues    HPI Subjective - Right arm pain and bruising for one week. Reports bumping it but does not recall the specific incident. Describes the pain as a sore, aching feeling. Difficulty holding her cat. Reports lifting a heavy bucket of cat litter. Pain has diminished some since onset. Bruising is running down the arm. - Skin chafing and irritation in the groin area from incontinence pads. Reports the area is pink. This is a chronic issue. - Urinary incontinence, ongoing. Reports urgency and frequency, going to the bathroom every two hours, including at night. Attending pelvic floor physical therapy for incontinence, which is helping. Exercises are helping more than other treatments. Learned techniques to quiet down the urgency.  Medications Tylenol  as needed for sciatica, turmeric. Past trials of oxybutynin  (caused excessive dryness), mirabegron  (Myrbetriq , too expensive but effective), and possibly Vesicare (mentioned in 2017 notes, unclear if taken). Nystatin  antifungal cream was prescribed in July for chafing. Also tried Desitin, castor oil, and other over-the-counter creams for chafing with some temporary relief. Currently using over-the-counter ointments and wet wipes.  PMH, PSH, FH, Social Hx PMHx: Sciatica, urinary incontinence, COVID-19 one month ago (tested negative for UTI at urgent care visit). History of overactive bladder treatments including acupuncture, Botox  to the bladder (a while ago), and percutaneous tibial nerve stimulation (PTNS). Social Hx: Accompanied by caregiver, Niels, who helps with memory. Drinks three large glasses of water per day.  ROS General: Reports not feeling well one month ago, diagnosed with COVID-19. Skin: Reports bruising on arm and pink  irritation in the groin. No other unusual bruising. Denies rash on inner thighs. GU: Reports urinary frequency, urgency, and nocturia.   The ASCVD Risk score (Arnett DK, et al., 2019) failed to calculate for the following reasons:   The 2019 ASCVD risk score is only valid for ages 19 to 64   Risk score cannot be calculated because patient has a medical history suggesting prior/existing ASCVD  Health Maintenance Due  Topic Date Due   Zoster Vaccines- Shingrix  (1 of 2) Never done   Pneumococcal Vaccine: 50+ Years (1 of 1 - PCV) Never done   Influenza Vaccine  Never done   COVID-19 Vaccine (4 - 2025-26 season) 03/22/2024      Objective:     BP 110/68   Pulse 62   Ht 5' 3 (1.6 m)   Wt 123 lb 1.9 oz (55.8 kg)   SpO2 98%   BMI 21.81 kg/m    Physical Exam Gen: alert, oriented Pulm: no respiratory distress Psych: pleasant affect - Right arm: Bruising noted. Tenderness to palpation over the upper arm. No pain on resisted flexion or abduction of the shoulder. Pain elicited with internal rotation. Resisted external rotation and adduction were painful. Full strength on elbow flexion/extension and wrist movements.   No results found for any visits on 04/15/24.      Assessment & Plan:   Urinary incontinence, unspecified type Assessment & Plan: - Chronic issue with significant impact on quality of life. Has tried multiple therapies including PT, acupuncture, PTNS, and Botox . Previous medication trials include oxybutynin  (intolerable side effects) and mirabegron  (effective but cost-prohibitive). - Continue with physical therapy exercises. - Will defer starting new oral medication at this time pending interest in a new implantable device  mentioned by her physical therapist. - Referral to pharmacy services to discuss medication costs and coverage, specifically for medications like Myrbetriq  or Gemtesa. Pharmacist will call to discuss options and potential costs based on Medicare Part D  deductible.  Orders: -     AMB Referral [facility] Care Management  Irritant dermatitis Assessment & Plan: - erythematous rash in groin due to constant moisture from incontinence pads. Has tried multiple barrier creams and an antifungal cream (nystatin ) with limited success. - Counseled to use Vaseline as a barrier cream after each time changing incontinence pad. Advised on its effectiveness as a barrier and low risk of allergic reaction.   Right shoulder tendinitis Assessment & Plan: - Likely rotator cuff tendinitis or muscle strain secondary to trauma (bumping arm) and exacerbated by heavy lifting (bucket of cat litter). Pain is primarily over the rotator cuff insertion. Bruising is resolving. - Continue rest and avoid heavy lifting. - Apply topical analgesics like Biofreeze, Icy Hot, or Voltaren  gel as needed. - Use heat as needed. - Provided with home exercise handout for stretching and strengthening.    I spent 30 min in the mgmt of this patient  Return in about 6 months (around 10/13/2024) for oab, pain, chaffing.    Toribio MARLA Slain, MD

## 2024-04-17 NOTE — Progress Notes (Signed)
 Remote Loop Recorder Transmission

## 2024-04-18 DIAGNOSIS — L249 Irritant contact dermatitis, unspecified cause: Secondary | ICD-10-CM | POA: Insufficient documentation

## 2024-04-18 DIAGNOSIS — M778 Other enthesopathies, not elsewhere classified: Secondary | ICD-10-CM | POA: Insufficient documentation

## 2024-04-18 NOTE — Assessment & Plan Note (Signed)
-   Chronic issue with significant impact on quality of life. Has tried multiple therapies including PT, acupuncture, PTNS, and Botox . Previous medication trials include oxybutynin  (intolerable side effects) and mirabegron  (effective but cost-prohibitive). - Continue with physical therapy exercises. - Will defer starting new oral medication at this time pending interest in a new implantable device mentioned by her physical therapist. - Referral to pharmacy services to discuss medication costs and coverage, specifically for medications like Myrbetriq  or Gemtesa. Pharmacist will call to discuss options and potential costs based on Medicare Part D deductible.

## 2024-04-18 NOTE — Assessment & Plan Note (Signed)
-   erythematous rash in groin due to constant moisture from incontinence pads. Has tried multiple barrier creams and an antifungal cream (nystatin ) with limited success. - Counseled to use Vaseline as a barrier cream after each time changing incontinence pad. Advised on its effectiveness as a barrier and low risk of allergic reaction.

## 2024-04-18 NOTE — Assessment & Plan Note (Signed)
-   Likely rotator cuff tendinitis or muscle strain secondary to trauma (bumping arm) and exacerbated by heavy lifting (bucket of cat litter). Pain is primarily over the rotator cuff insertion. Bruising is resolving. - Continue rest and avoid heavy lifting. - Apply topical analgesics like Biofreeze, Icy Hot, or Voltaren  gel as needed. - Use heat as needed. - Provided with home exercise handout for stretching and strengthening.

## 2024-04-20 ENCOUNTER — Telehealth: Payer: Self-pay | Admitting: *Deleted

## 2024-04-20 NOTE — Progress Notes (Unsigned)
 Care Guide Pharmacy Note  04/20/2024 Name: Cheryl Martin MRN: [id number] DOB: December 21, 1930  Referred By: Chandra Toribio POUR, MD Reason for referral: Call Attempt #1 (Outreach to schedule referral with pharmacist )   Cheryl Martin is a 89 y.o. year old female who is a primary care patient of Chandra Toribio POUR, MD.  Cheryl Martin was referred to the pharmacist for assistance related to: mybetric   An unsuccessful telephone outreach was attempted today to contact the patient who was referred to the pharmacy team for assistance with medication assistance. Additional attempts will be made to contact the patient.  Thedford Franks, CMA [place]  [facility], [facility] Guide Direct Dial: [phone]  Fax: [phone] Website: [email or web address]

## 2024-04-21 NOTE — Progress Notes (Signed)
 Care Guide Pharmacy Note  04/21/2024 Name: [name] MRN: [id number] DOB: January 25, 1931  Referred By: Chandra Toribio POUR, MD Reason for referral: Call Attempt #1 (Outreach to schedule referral with pharmacist )   Cheryl Martin is a 89 y.o. year old female who is a primary care patient of Chandra Toribio POUR, MD.  [name] was referred to the pharmacist for assistance related to: mybetric   Successful contact was made with the patient to discuss pharmacy services including being ready for the pharmacist to call at least 5 minutes before the scheduled appointment time and to have medication bottles and any blood pressure readings ready for review. The patient agreed to meet with the pharmacist via telephone visit on 04/29/2024  Thedford Franks, CMA [place]  [facility], [facility] Guide Direct Dial: [phone]  Fax: [phone] Website: [email or web address]

## 2024-04-26 ENCOUNTER — Ambulatory Visit: Admitting: Urology

## 2024-04-26 ENCOUNTER — Other Ambulatory Visit: Payer: Self-pay

## 2024-04-26 DIAGNOSIS — R32 Unspecified urinary incontinence: Secondary | ICD-10-CM

## 2024-04-26 MED ORDER — GEMTESA 75 MG PO TABS: 1.0000 | ORAL_TABLET | Freq: Every day | ORAL | 3 refills | Status: DC

## 2024-04-26 NOTE — Progress Notes (Signed)
   04/26/2024 Name: RYLEI MASELLA MRN: [id number] DOB: 1930-07-26  Chief Complaint  Patient presents with   Medication Assistance    Referred by PCP to discuss urinary incontinence medications including Gemtesa and Myrbetriq  and there associated costs/possible medication assistance programs.   Insurance Plan Review for [employer] Extra PPO Medicare Advantage Prescription Drug Plan [phone] version of current plan). Plan ID: [id number] - Drug Deductible $500 (for Medication Tiers 3-5)  - Gemtesa and Myrbetriq  are both tier 3 medications and require a 22% coinsurance once $500 deductible is met  Estimated 30 day cosinsurance got Gemtesa $111.12 and $121.56 foMyrbetriq    Patient Assistance Review: Available for Gemtesa, historically has been difficult to gain approval - patient is aware and is willing to try. Will initiate application - spoke with representative with new PAP program for Gemtesa ([employer] PAP). Stated to apply by sending in eRx to [facility] then they will contact patient - order for Gemtesa pended to PCP.   Open enrollment for 2026 Plan Year Starts May 05 2024 Consider talking with a Medicare Counselor through [place] [facility]II[facility] Program to review alternative plans that might better accommodate your medical and prescription needs. See information below to set up appointment with Medicare Counselor.   -Seniors? Health Insurance Information Program [facility]). Contact Medicare Counselor / set up an appointment by calling [phone]. Hours are Monday through Friday from 8am to 5pm.  Consider Applying for Medicare Part D Extra Help Program to help lower prescription drug costs  -You would need to make $17,600 or less in income annually to qualify.  -You can apply on the [employer] below.  [email or web address]   Please contact me with any questions and let me know of approval status for Gemtesa Patient  Assistance - my direct line is [phone].Thank you.   Future Appointments  Date Time Provider Department Center  04/28/2024 10:45 AM Roseann Adine PARAS., MD [facility] None  04/29/2024  9:15 AM CVD HVT DEVICE REMOTES [facility] H&V  05/31/2024  9:15 AM CVD HVT DEVICE REMOTES [facility] H&V  07/01/2024  9:15 AM CVD HVT DEVICE REMOTES [facility] H&V  07/07/2024 11:45 AM Gregg Lek, MD [facility] None  08/02/2024  9:15 AM CVD HVT DEVICE REMOTES [facility] H&V  10/13/2024 10:10 AM Chandra Toribio POUR, MD [facility]  03/10/2025  3:00 PM PCFO-ANNUAL WELLNESS VISIT [facility]    Lang Sieve, PharmD, [facility] Clinical Pharmacist  [phone]

## 2024-04-28 ENCOUNTER — Ambulatory Visit: Admitting: Urology
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
## 2024-04-29 ENCOUNTER — Ambulatory Visit (INDEPENDENT_AMBULATORY_CARE_PROVIDER_SITE_OTHER)

## 2024-04-29 ENCOUNTER — Encounter

## 2024-04-29 DIAGNOSIS — G459 Transient cerebral ischemic attack, unspecified: Secondary | ICD-10-CM

## 2024-04-29 LAB — CUP PACEART REMOTE DEVICE CHECK
Date Time Interrogation Session: 20251008232451
Implantable Pulse Generator Implant Date: 20231115

## 2024-05-02 ENCOUNTER — Ambulatory Visit: Payer: Self-pay | Admitting: Cardiology

## 2024-05-03 NOTE — Progress Notes (Signed)
 Remote Loop Recorder Transmission

## 2024-05-05 ENCOUNTER — Other Ambulatory Visit: Payer: Self-pay | Admitting: Internal Medicine

## 2024-05-05 NOTE — Progress Notes (Signed)
 Remote Loop Recorder Transmission

## 2024-05-11 DIAGNOSIS — M5416 Radiculopathy, lumbar region: Secondary | ICD-10-CM | POA: Diagnosis not present

## 2024-05-12 DIAGNOSIS — N3941 Urge incontinence: Secondary | ICD-10-CM | POA: Diagnosis not present

## 2024-05-17 ENCOUNTER — Ambulatory Visit: Admitting: Urology

## 2024-05-24 ENCOUNTER — Encounter: Payer: Self-pay | Admitting: Family Medicine

## 2024-05-24 DIAGNOSIS — R202 Paresthesia of skin: Secondary | ICD-10-CM

## 2024-05-25 NOTE — Telephone Encounter (Signed)
 Pt is schedule

## 2024-05-27 ENCOUNTER — Other Ambulatory Visit

## 2024-05-27 DIAGNOSIS — R202 Paresthesia of skin: Secondary | ICD-10-CM

## 2024-05-28 ENCOUNTER — Ambulatory Visit: Payer: Self-pay

## 2024-05-28 LAB — BASIC METABOLIC PANEL WITH GFR
BUN/Creatinine Ratio: 20 (ref 12–28)
BUN: 14 mg/dL (ref 10–36)
CO2: 22 mmol/L (ref 20–29)
Calcium: 9.5 mg/dL (ref 8.7–10.3)
Chloride: 98 mmol/L (ref 96–106)
Creatinine, Ser: 0.7 mg/dL (ref 0.57–1.00)
Glucose: 86 mg/dL (ref 70–99)
Potassium: 4.7 mmol/L (ref 3.5–5.2)
Sodium: 132 mmol/L — ABNORMAL LOW (ref 134–144)
eGFR: 82 mL/min/1.73 (ref >59)

## 2024-05-28 LAB — B12 AND FOLATE PANEL
Folate: >20 ng/mL (ref >3.0)
Vitamin B-12: 1311 pg/mL — ABNORMAL HIGH (ref 232–1245)

## 2024-05-28 LAB — MAGNESIUM: Magnesium: 2.1 mg/dL (ref 1.6–2.3)

## 2024-05-29 ENCOUNTER — Ambulatory Visit
Admission: RE | Admit: 2024-05-29 | Discharge: 2024-05-29 | Disposition: A | Discharge status: Home / Self Care | Source: Ambulatory Visit | Attending: Internal Medicine | Admitting: Internal Medicine

## 2024-05-29 ENCOUNTER — Ambulatory Visit (INDEPENDENT_AMBULATORY_CARE_PROVIDER_SITE_OTHER)

## 2024-05-29 VITALS — BP 113/58 | HR 65 | Temp 97.7°F | Resp 18 | Wt 122.0 lb

## 2024-05-29 DIAGNOSIS — M5442 Lumbago with sciatica, left side: Secondary | ICD-10-CM

## 2024-05-29 DIAGNOSIS — M4186 Other forms of scoliosis, lumbar region: Secondary | ICD-10-CM | POA: Diagnosis not present

## 2024-05-29 DIAGNOSIS — M5136 Other intervertebral disc degeneration, lumbar region with discogenic back pain only: Secondary | ICD-10-CM | POA: Diagnosis not present

## 2024-05-29 DIAGNOSIS — M5441 Lumbago with sciatica, right side: Secondary | ICD-10-CM | POA: Diagnosis not present

## 2024-05-29 DIAGNOSIS — M7989 Other specified soft tissue disorders: Secondary | ICD-10-CM | POA: Diagnosis not present

## 2024-05-29 DIAGNOSIS — M47816 Spondylosis without myelopathy or radiculopathy, lumbar region: Secondary | ICD-10-CM | POA: Diagnosis not present

## 2024-05-29 DIAGNOSIS — M25562 Pain in left knee: Secondary | ICD-10-CM | POA: Diagnosis not present

## 2024-05-29 DIAGNOSIS — Z96652 Presence of left artificial knee joint: Secondary | ICD-10-CM | POA: Diagnosis not present

## 2024-05-29 NOTE — Discharge Instructions (Addendum)
 X-ray done today of the left knee and lower back.  Final evaluation by the radiologist shows no acute findings on the left knee and no acute process in the lower back.  Symptoms and physical exam findings are most consistent with muscular strain and inflammation.  There is also resolving bruising to the left knee.  Recommend light stretching, moist heat for the back and cool compresses for the knee and keeping follow-up appointment as scheduled with your spine specialist.  If symptoms worsen then may need to follow-up at the emergency room where more advanced imaging can be performed.
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
## 2024-05-29 NOTE — ED Triage Notes (Signed)
 Patient states about a week ago she took a fall forward with pain and bruising to left knee and lower back pain.  Taking OTC Tylenol  with some relief

## 2024-05-30 ENCOUNTER — Ambulatory Visit (INDEPENDENT_AMBULATORY_CARE_PROVIDER_SITE_OTHER)

## 2024-05-30 DIAGNOSIS — G459 Transient cerebral ischemic attack, unspecified: Secondary | ICD-10-CM | POA: Diagnosis not present

## 2024-05-30 LAB — CUP PACEART REMOTE DEVICE CHECK
Date Time Interrogation Session: 20251108231534
Implantable Pulse Generator Implant Date: 20231115

## 2024-05-31 ENCOUNTER — Telehealth: Payer: Self-pay | Admitting: Cardiology

## 2024-05-31 ENCOUNTER — Encounter

## 2024-05-31 NOTE — Telephone Encounter (Signed)
 Pt c/o of Chest Pain: STAT if active (IN THIS MOMENT) CP, including tightness, pressure, jaw pain, shoulder/upper arm/back pain, SOB, nausea, and vomiting.  1. Are you having CP right now (tightness, pressure, or discomfort)? No   2. Are you experiencing any other symptoms (ex. SOB, nausea, vomiting, sweating)? No but she states she fell 10/22 and hit her chest really hard. She is concerned her having the sharp pain may be related to the fall.   She also states she thought she was supposed to get her device changed soon, please advise.   3. How long have you been experiencing CP? Occurred twice (Saturday and Sunday)   4. Is your CP continuous or coming and going? Coming and going sharp pain   5. Have you taken Nitroglycerin ? No   6. If CP returns before callback, please consider calling 911. ?

## 2024-05-31 NOTE — Telephone Encounter (Signed)
 Left voice message to call back 11/10

## 2024-06-01 ENCOUNTER — Ambulatory Visit: Payer: Self-pay | Admitting: Cardiology

## 2024-06-01 DIAGNOSIS — N3281 Overactive bladder: Secondary | ICD-10-CM | POA: Diagnosis not present

## 2024-06-01 DIAGNOSIS — R351 Nocturia: Secondary | ICD-10-CM | POA: Diagnosis not present

## 2024-06-01 DIAGNOSIS — R350 Frequency of micturition: Secondary | ICD-10-CM | POA: Diagnosis not present

## 2024-06-01 NOTE — Telephone Encounter (Signed)
 Remote transmission checked today 06/01/24. Shows normal device function. No alerts triggered during patients symptoms.   Device was implanted 06/05/2022, so still has approx. Several years of battery life remaining. Do not have exact time frame d/t device does not provide specific time.

## 2024-06-01 NOTE — Progress Notes (Unsigned)
 Cardiology Office Note:   Date:  06/03/2024  ID:  Cheryl Martin, DOB 07-04-1931, MRN [id number] PCP:  Cheryl Toribio POUR, MD  [facility] Providers Cardiologist:  Cheryl Haws, MD Referring MD: Cheryl Toribio POUR, MD  Chief Complaint/Reason for Referral: Follow-up coronary artery calcification ASSESSMENT:    1. Lightheadedness   2. Coronary artery calcification   3. Hyperlipidemia LDL goal <55   4. Aortic atherosclerosis   5. TIA (transient ischemic attack)     PLAN:   In order of problems listed above: Lightheadedness: Orthostatic vital signs not consistent with POTS or orthostatic hypotension.  Monitor reassuring.  BMP reassuring recently. Coronary artery calcification: Continue Plavix  75 mg, Zetia  10 mg, rosuvastatin  20 mg. Hyperlipidemia: Continue Zetia  10 mg, rosuvastatin  20 mg . LDL in March ws 54. Aortic atherosclerosis: Continue Plavix  75 mg, rosuvastatin  20 mg TIA: Continue Plavix  75 mg, rosuvastatin  20 mg, monitor demonstrates no atrial fibrillation.  Patient requests Linq monitor to be extracted.  Will refer to EP for this purpose.            Dispo:  Return in about 1 year (around 06/03/2025).       I spent 33 minutes reviewing all clinical data during and prior to this visit including all relevant imaging studies, laboratories, clinical information from other health systems and prior notes from both Cardiology and other specialties, interviewing the patient, conducting a complete physical examination, and coordinating care in order to formulate a comprehensive and personalized evaluation and treatment plan.   History of Present Illness:    FOCUSED PROBLEM LIST:   Coronary artery calcification Chest CT 2021 Hyperlipidemia Aortic atherosclerosis Chest CT 2021 History of CVA TIA May 2023 Negative bubble study echocardiogram Linq monitor in place No atrial fibrillation seen TIA January 2025 MRI negative >> discharged on Plavix  for 3 weeks  May 2023:   The patient is a 89 y.o. female with the indicated medical history here for cardiology follow-up.  She was seen in our office in May after hospitalization for TIA.  She had been on dual and platelet therapy at that time.  She was also started on Crestor  20 mg.  She was referred for a 30-day event monitor which demonstrated no atrial fibrillation.  A Linq monitor was eventually placed.   July 2024: The patient had her ILR monitor interrogated a few days ago which demonstrated no atrial fibrillation.  The patient is doing well.  She denies any chest pain or shortness of breath.  She is diagnosed signs or symptoms of recurrent TIA or stroke.  She is here with her daughter.  They are worried about the long-term effects of statin medications and would like to avoid these.  She is no longer on her Crestor .  Plan: Start Zetia  10 mg.  November 2025:  Patient consents to use of AI scribe. The patient returns for routine follow-up.  In the interim she was seen and hospitalized in January 2025 with what was likely a TIA.  Her MRI was negative and she was discharged home on 3 weeks of Plavix .  She reached out to our office recently due to an episode of low blood pressure and lightheadedness.  It passed quickly.     Current Medications: Current Meds  Medication Sig   acetaminophen  (TYLENOL ) 500 MG tablet Take 2 tablets (1,000 mg total) by mouth every 8 (eight) hours as needed for mild pain (pain score 1-3) or moderate pain (pain score 4-6).   azithromycin  (ZITHROMAX ) 250 MG tablet  Take 1 tablet (250 mg total) by mouth daily.   Cholecalciferol (VITAMIN D -3 PO) Take 1 tablet by mouth daily at 12 noon.   clopidogrel  (PLAVIX ) 75 MG tablet Take 1 tablet (75 mg total) by mouth daily.   Cyanocobalamin (VITAMIN B-12 SL) Place 1 tablet under the tongue daily at 12 noon.   donepezil  (ARICEPT ) 5 MG tablet TAKE 1 TABLET BY MOUTH EVERYDAY AT BEDTIME   ezetimibe  (ZETIA ) 10 MG tablet TAKE 1 TABLET BY MOUTH EVERY DAY    fluticasone  (FLONASE ) 50 MCG/ACT nasal spray Place 2 sprays into both nostrils daily.   HYDROcodone  bit-homatropine (HYCODAN) 5-1.5 MG/5ML syrup Take 2.5-5 mLs by mouth every 6 (six) hours as needed for cough.   Multiple Vitamins-Minerals (MULTIVITAMIN WOMEN 50+) TABS Take 1 tablet by mouth daily at 12 noon.   nitroGLYCERIN  (NITROSTAT ) 0.4 MG SL tablet Place 1 tablet (0.4 mg total) under the tongue every 5 (five) minutes as needed for chest pain.   nystatin  cream (MYCOSTATIN ) Apply 1 Application topically 2 (two) times daily.   Omega-3 Fatty Acids (OMEGA-3 FISH OIL PO) Take 1 capsule by mouth daily at 12 noon.   rosuvastatin  (CRESTOR ) 20 MG tablet Take 1 tablet (20 mg total) by mouth daily.   Vibegron (GEMTESA) 75 MG TABS Take 1 tablet (75 mg total) by mouth daily.     Review of Systems:   Please see the history of present illness.    All other systems reviewed and are negative.     EKGs/Labs/Other Test Reviewed:   EKG: 2025 sinus rhythm  EKG Interpretation Date/Time:  Thursday June 03 2024 15:11:34 EST Ventricular Rate:  71 PR Interval:  150 QRS Duration:  72 QT Interval:  398 QTC Calculation: 432 R Axis:   79  Text Interpretation: Normal sinus rhythm Normal ECG When compared with ECG of 06-Aug-2023 13:01, PREVIOUS ECG IS PRESENT Confirmed by Cheryl Martin ([id number]) on 06/03/2024 3:33:35 PM        CARDIAC STUDIES: Refer to CV Procedures and Imaging Tabs   Risk Assessment/Calculations:          Physical Exam:   VS:  BP 134/60 (BP Location: Left Arm, Patient Position: Sitting, Cuff Size: Normal)   Pulse 74   Resp 16   Ht 5' 3 (1.6 m)   Wt 130 lb 9.6 oz (59.2 kg)   SpO2 98%   BMI 23.13 kg/m        Wt Readings from Last 3 Encounters:  06/03/24 130 lb 9.6 oz (59.2 kg)  05/29/24 122 lb (55.3 kg)  04/15/24 123 lb 1.9 oz (55.8 kg)      GENERAL:  No apparent distress, AOx3 HEENT:  No carotid bruits, +2 carotid impulses, no scleral icterus CAR: RRR no murmurs,  gallops, rubs, or thrills RES:  Clear to auscultation bilaterally ABD:  Soft, nontender, nondistended, positive bowel sounds x 4 VASC:  +2 radial pulses, +2 carotid pulses NEURO:  CN 2-12 grossly intact; motor and sensory grossly intact PSYCH:  No active depression or anxiety EXT:  No edema, ecchymosis, or cyanosis  Signed, Terrica Duecker K Kristilyn Coltrane, MD  06/03/2024 3:53 PM    [facility] [address] [place], [place], [place]  [address] Phone: [phone]; Fax: [phone]   Note:  This document was prepared using Dragon voice recognition software and may include unintentional dictation errors.

## 2024-06-01 NOTE — Telephone Encounter (Signed)
Pt returning nurses call from yesterday. Please advise 

## 2024-06-01 NOTE — Telephone Encounter (Signed)
 Call to patient, 2 identifiers used. Patient reports she fell on 05/12/24 when she tripped in a parking lot. She states that she broke her fall with both arms and  her knee, but only sustained bruising to her knee. She states that on both Saturday Nov. 1 and Sunday Nov. 2, she had brief sharp pain around her loop recorder. She had no other symptoms. Then, last week, she had a blood pressure of 80/63 but she doesn't remember what time of day it was or which day. She reports one episode of lightheadedness a few days ago while standing at the sink doing dishes. She states it passed quickly and she did not experience any syncope or near syncope. Patient is overdue for follow up with Dr. Wendel and would like to see Dr. Wendel to get caught up on her follow up.    She is also wondering if her loop recorder could have been damaged when she fell and would like an appointment with Dr. Inocencio to check her loop recorder and to see when her battery is due to be replaced.

## 2024-06-02 NOTE — Progress Notes (Signed)
 Remote Loop Recorder Transmission

## 2024-06-02 NOTE — Telephone Encounter (Signed)
 Spoke with pt on 06/01/24 and offered her an appt with Dr. Wendel on 06/03/24. Pt stated she was not at her calendar but she would call and let us  know if the appt did not work and to go ahead and put her down for the appt slot.

## 2024-06-03 ENCOUNTER — Encounter: Payer: Self-pay | Admitting: Internal Medicine

## 2024-06-03 ENCOUNTER — Ambulatory Visit: Attending: Internal Medicine | Admitting: Internal Medicine

## 2024-06-03 VITALS — BP 134/60 | HR 74 | Resp 16 | Ht 63.0 in | Wt 130.6 lb

## 2024-06-03 DIAGNOSIS — G459 Transient cerebral ischemic attack, unspecified: Secondary | ICD-10-CM

## 2024-06-03 DIAGNOSIS — I700 Atherosclerosis of aorta: Secondary | ICD-10-CM

## 2024-06-03 DIAGNOSIS — R42 Dizziness and giddiness: Secondary | ICD-10-CM | POA: Diagnosis not present

## 2024-06-03 DIAGNOSIS — I2510 Atherosclerotic heart disease of native coronary artery without angina pectoris: Secondary | ICD-10-CM

## 2024-06-03 DIAGNOSIS — E785 Hyperlipidemia, unspecified: Secondary | ICD-10-CM | POA: Diagnosis not present

## 2024-06-03 NOTE — Patient Instructions (Signed)
 Medication Instructions:  No medication changes were made at this visit. Continue current regimen.   *If you need a refill on your cardiac medications before your next appointment, please call your pharmacy*  Lab Work: None ordered today. If you have labs (blood work) drawn today and your tests are completely normal, you will receive your results only by: MyChart Message (if you have MyChart) OR A paper copy in the mail If you have any lab test that is abnormal or we need to change your treatment, we will call you to review the results.  Testing/Procedures: None ordered today.  Follow-Up: At [facility], you and your health needs are our priority.  As part of our continuing mission to provide you with exceptional heart care, our providers are all part of one team.  This team includes your primary Cardiologist (physician) and Advanced Practice Providers or APPs (Physician Assistants and Nurse Practitioners) who all work together to provide you with the care you need, when you need it.  Your next appointment:   Next available with Dr. Inocencio  1 year with APP

## 2024-06-04 ENCOUNTER — Encounter (HOSPITAL_COMMUNITY): Payer: Self-pay | Admitting: Emergency Medicine

## 2024-06-04 ENCOUNTER — Emergency Department (HOSPITAL_COMMUNITY): Admission: EM | Admit: 2024-06-04 | Discharge: 2024-06-04 | Disposition: A | Discharge status: Home / Self Care

## 2024-06-04 ENCOUNTER — Emergency Department (HOSPITAL_COMMUNITY)

## 2024-06-04 ENCOUNTER — Other Ambulatory Visit: Payer: Self-pay

## 2024-06-04 DIAGNOSIS — G8929 Other chronic pain: Secondary | ICD-10-CM | POA: Insufficient documentation

## 2024-06-04 DIAGNOSIS — M5442 Lumbago with sciatica, left side: Secondary | ICD-10-CM | POA: Insufficient documentation

## 2024-06-04 DIAGNOSIS — Z7902 Long term (current) use of antithrombotics/antiplatelets: Secondary | ICD-10-CM | POA: Diagnosis not present

## 2024-06-04 DIAGNOSIS — M5441 Lumbago with sciatica, right side: Secondary | ICD-10-CM | POA: Diagnosis not present

## 2024-06-04 DIAGNOSIS — M545 Low back pain, unspecified: Secondary | ICD-10-CM | POA: Diagnosis present

## 2024-06-04 DIAGNOSIS — M4316 Spondylolisthesis, lumbar region: Secondary | ICD-10-CM | POA: Diagnosis not present

## 2024-06-04 LAB — CBC
HCT: 33 % — ABNORMAL LOW (ref 36.0–46.0)
Hemoglobin: 11.3 g/dL — ABNORMAL LOW (ref 12.0–15.0)
MCH: 31.4 pg (ref 26.0–34.0)
MCHC: 34.2 g/dL (ref 30.0–36.0)
MCV: 91.7 fL (ref 80.0–100.0)
Platelets: 174 K/uL (ref 150–400)
RBC: 3.6 MIL/uL — ABNORMAL LOW (ref 3.87–5.11)
RDW: 12.7 % (ref 11.5–15.5)
WBC: 4.2 K/uL (ref 4.0–10.5)
nRBC: 0 % (ref 0.0–0.2)

## 2024-06-04 LAB — URINALYSIS, ROUTINE W REFLEX MICROSCOPIC
Bilirubin Urine: NEGATIVE
Glucose, UA: NEGATIVE mg/dL
Hgb urine dipstick: NEGATIVE
Ketones, ur: NEGATIVE mg/dL
Leukocytes,Ua: NEGATIVE
Nitrite: NEGATIVE
Protein, ur: NEGATIVE mg/dL
Specific Gravity, Urine: 1.005 (ref 1.005–1.030)
pH: 8 (ref 5.0–8.0)

## 2024-06-04 LAB — BASIC METABOLIC PANEL WITH GFR
Anion gap: 7 (ref 5–15)
BUN: 14 mg/dL (ref 8–23)
CO2: 25 mmol/L (ref 22–32)
Calcium: 9.6 mg/dL (ref 8.9–10.3)
Chloride: 100 mmol/L (ref 98–111)
Creatinine, Ser: 0.64 mg/dL (ref 0.44–1.00)
GFR, Estimated: >60 mL/min (ref >60)
Glucose, Bld: 90 mg/dL (ref 70–99)
Potassium: 4.3 mmol/L (ref 3.5–5.1)
Sodium: 132 mmol/L — ABNORMAL LOW (ref 135–145)

## 2024-06-04 MED ORDER — LIDOCAINE 5 % EX PTCH
1.0000 | MEDICATED_PATCH | CUTANEOUS | 0 refills | Status: AC
Start: 2024-06-04 — End: ?

## 2024-06-04 MED ORDER — TRAMADOL HCL 50 MG PO TABS
50.0000 mg | ORAL_TABLET | Freq: Once | ORAL | Status: AC
  Administered 2024-06-04: 50 mg via ORAL
  Filled 2024-06-04: qty 1

## 2024-06-04 MED ORDER — DEXAMETHASONE 4 MG PO TABS
4.0000 mg | ORAL_TABLET | Freq: Once | ORAL | Status: AC
  Administered 2024-06-04: 4 mg via ORAL
  Filled 2024-06-04: qty 1

## 2024-06-04 MED ORDER — LIDOCAINE 5 % EX PTCH
1.0000 | MEDICATED_PATCH | Freq: Once | CUTANEOUS | Status: DC
  Administered 2024-06-04: 1 via TRANSDERMAL
  Filled 2024-06-04: qty 1

## 2024-06-04 MED ORDER — TRAMADOL HCL 50 MG PO TABS: 50.0000 mg | ORAL_TABLET | Freq: Four times a day (QID) | ORAL | 0 refills | Status: DC | PRN

## 2024-06-04 MED ORDER — GABAPENTIN 300 MG PO CAPS
300.0000 mg | ORAL_CAPSULE | Freq: Once | ORAL | Status: AC
  Administered 2024-06-04: 300 mg via ORAL
  Filled 2024-06-04: qty 1

## 2024-06-04 MED ORDER — GABAPENTIN 100 MG PO CAPS: 100.0000 mg | ORAL_CAPSULE | Freq: Three times a day (TID) | ORAL | 0 refills | Status: AC

## 2024-06-04 MED ORDER — ACETAMINOPHEN 325 MG PO TABS
650.0000 mg | ORAL_TABLET | Freq: Once | ORAL | Status: AC
  Administered 2024-06-04: 650 mg via ORAL
  Filled 2024-06-04: qty 2

## 2024-06-04 NOTE — ED Triage Notes (Signed)
 Pt via [facility] from home. Pt fell on Oct 22 and has had progressively worsening pain down her back and both legs down to her knees. She was not evaluated immediately after the fall. Pt says she is now having difficulty with ambulation; ambulatory w/o assistive devices at baseline. Pain is sharp/stabbing with occasional tingling. Hx sciatica and scoliosis with appt scheduled for 11/17 for steroid injections. OTC meds at home w/o relief. A/O x 4. No visible injury noted.

## 2024-06-04 NOTE — Discharge Instructions (Signed)
 Please follow-up with your primary doctor and your spinal surgeon.  Return for fevers, chills, severe pain, numbness in your genital area, weakness in your legs, lightheadedness, passout, bladder or bladder incontinence or any new or worsening symptoms that are concerning to you.

## 2024-06-04 NOTE — ED Provider Notes (Signed)
 [place] EMERGENCY DEPARTMENT AT [facility] Provider Note   CSN: [id number] Arrival date & time: 06/04/24  8171     Patient presents with: Cheryl Martin is a 89 y.o. female.   This is a 89 year old female presenting emergency department for back pain.  Reports a fall at the end of October went to urgent care several days later and had negative lumbar x-ray.  Has progressive worsening of her low back pain since that time.  She does have chronic back pain for which she sees Ortho and has had prior injections.  She denies fevers, chills, weakness, saddle anesthesia.  She was particularly more active yesterday, the pain today worse.  Able to ambulate with aid of walker, but notes she typically does not ambulate with a walker.   Fall       Prior to Admission medications   Medication Sig Start Date End Date Taking? Authorizing Provider  gabapentin  (NEURONTIN ) 100 MG capsule Take 1 capsule (100 mg total) by mouth 3 (three) times daily for 7 days. 06/04/24 06/11/24 Yes Nguyen Todorov, Caron PARAS, DO  lidocaine  (LIDODERM ) 5 % Place 1 patch onto the skin daily. Remove & Discard patch within 12 hours or as directed by MD 06/04/24  Yes Neysa Caron PARAS, DO  traMADol  (ULTRAM ) 50 MG tablet Take 1 tablet (50 mg total) by mouth every 6 (six) hours as needed. 06/04/24  Yes Neysa Caron PARAS, DO  acetaminophen  (TYLENOL ) 500 MG tablet Take 2 tablets (1,000 mg total) by mouth every 8 (eight) hours as needed for mild pain (pain score 1-3) or moderate pain (pain score 4-6). 06/30/23   Jerral Meth, MD  azithromycin  (ZITHROMAX ) 250 MG tablet Take 1 tablet (250 mg total) by mouth daily. 03/09/24   Darlean Ozell KATHEE, MD  Cholecalciferol (VITAMIN D -3 PO) Take 1 tablet by mouth daily at 12 noon.    [provider]  clopidogrel  (PLAVIX ) 75 MG tablet Take 1 tablet (75 mg total) by mouth daily. 08/07/23 08/06/24  Drusilla Sabas RAMAN, MD  Cyanocobalamin (VITAMIN B-12 SL) Place 1 tablet under the  tongue daily at 12 noon.    [provider]  donepezil  (ARICEPT ) 5 MG tablet TAKE 1 TABLET BY MOUTH EVERYDAY AT BEDTIME 02/09/24   Gregg Lek, MD  ezetimibe  (ZETIA ) 10 MG tablet TAKE 1 TABLET BY MOUTH EVERY DAY 05/07/24   Thukkani, Arun K, MD  fluticasone  (FLONASE ) 50 MCG/ACT nasal spray Place 2 sprays into both nostrils daily. 08/19/23   Wallace Joesph LABOR, PA  HYDROcodone  bit-homatropine (HYCODAN) 5-1.5 MG/5ML syrup Take 2.5-5 mLs by mouth every 6 (six) hours as needed for cough. 03/03/24   Rolinda Rogue, MD  Multiple Vitamins-Minerals (MULTIVITAMIN WOMEN 50+) TABS Take 1 tablet by mouth daily at 12 noon.    [provider]  nitroGLYCERIN  (NITROSTAT ) 0.4 MG SL tablet Place 1 tablet (0.4 mg total) under the tongue every 5 (five) minutes as needed for chest pain. 10/22/18 06/03/24  Maranda Leim DEL, MD  nystatin  cream (MYCOSTATIN ) Apply 1 Application topically 2 (two) times daily. 02/08/24   Billy Asberry FALCON, PA-C  Omega-3 Fatty Acids (OMEGA-3 FISH OIL PO) Take 1 capsule by mouth daily at 12 noon.    [provider]  rosuvastatin  (CRESTOR ) 20 MG tablet Take 1 tablet (20 mg total) by mouth daily. 12/17/23   Chandra Toribio POUR, MD  Vibegron (GEMTESA) 75 MG TABS Take 1 tablet (75 mg total) by mouth daily. 04/26/24   Gayle Saddie FALCON, PA-C  Allergies: Levofloxacin , Bee pollen, Bovine (beef) protein-containing drug products, Cyclobenzaprine , Ditropan  [oxybutynin ], Pollen extract, Silicone, Tape, Trichophyton, and Wound dressing adhesive    Review of Systems  Updated Vital Signs BP 134/74 (BP Location: Left Arm)   Pulse 61   Temp (!) 97.5 F (36.4 C) (Oral)   Resp 14   Ht 5' 3 (1.6 m)   Wt 55.8 kg   SpO2 97%   BMI 21.79 kg/m   Physical Exam Vitals and nursing note reviewed.  Constitutional:      General: She is not in acute distress.    Appearance: She is not toxic-appearing.  HENT:     Nose: Nose normal.     Mouth/Throat:     Mouth: Mucous membranes are moist.   Cardiovascular:     Rate and Rhythm: Normal rate and regular rhythm.  Pulmonary:     Effort: Pulmonary effort is normal.     Breath sounds: Normal breath sounds.  Abdominal:     General: Abdomen is flat. There is no distension.     Palpations: Abdomen is soft.     Tenderness: There is no abdominal tenderness. There is no guarding or rebound.  Musculoskeletal:        General: Normal range of motion.     Comments: Equal strength in lower extremities.  4-5.  Able to somewhat lift legs off of bed bilaterally.  Negative straight leg raise.  Normal sensation.  Equal pulses.  Does have some tenderness near the SI joint.  Skin:    General: Skin is warm and dry.     Capillary Refill: Capillary refill takes less than 2 seconds.  Neurological:     Mental Status: She is alert and oriented to person, place, and time.  Psychiatric:        Mood and Affect: Mood normal.        Behavior: Behavior normal.     (all labs ordered are listed, but only abnormal results are displayed) Labs Reviewed  CBC - Abnormal; Notable for the following components:      Result Value   RBC 3.60 (*)    Hemoglobin 11.3 (*)    HCT 33.0 (*)    All other components within normal limits  BASIC METABOLIC PANEL WITH GFR - Abnormal; Notable for the following components:   Sodium 132 (*)    All other components within normal limits  URINALYSIS, ROUTINE W REFLEX MICROSCOPIC - Abnormal; Notable for the following components:   Color, Urine STRAW (*)    All other components within normal limits    EKG: None  Radiology: CT Lumbar Spine Wo Contrast Result Date: 06/04/2024 EXAM: CT OF THE LUMBAR SPINE WITHOUT CONTRAST 06/04/2024 08:31:05 PM TECHNIQUE: CT of the lumbar spine was performed without the administration of intravenous contrast. Multiplanar reformatted images are provided for review. Automated exposure control, iterative reconstruction, and/or weight based adjustment of the mA/kV was utilized to reduce the  radiation dose to as low as reasonably achievable. COMPARISON: None available. CLINICAL HISTORY: Low back pain, increased fracture risk. FINDINGS: BONES AND ALIGNMENT: Normal vertebral body heights. No acute fracture or suspicious bone lesion. Upper lumbar levoscoliosis. Grade 1 anterolisthesis of L4 on L5. DEGENERATIVE CHANGES: Mild degenerative changes of the mid lumbar spine. SOFT TISSUES: Vascular calcifications. Status post cholecystectomy. Moderate colonic stool burden suggesting constipation. IMPRESSION: 1. Mild degenerative changes of the mid lumbar spine. 2. Moderate colonic stool burden, suggesting mild constipation. Electronically signed by: Pinkie Pebbles MD 06/04/2024 08:39 PM EST RP Workstation:  [id number]     Procedures   Medications Ordered in the ED  lidocaine  (LIDODERM ) 5 % 1 patch (1 patch Transdermal Patch Applied 06/04/24 2042)  acetaminophen  (TYLENOL ) tablet 650 mg (650 mg Oral Given 06/04/24 2042)  traMADol  (ULTRAM ) tablet 50 mg (50 mg Oral Given 06/04/24 2041)  dexamethasone  (DECADRON ) tablet 4 mg (4 mg Oral Given 06/04/24 2041)  gabapentin  (NEURONTIN ) capsule 300 mg (300 mg Oral Given 06/04/24 2042)    Clinical Course as of 06/04/24 2254  Fri Jun 04, 2024  2056 CT Lumbar Spine Wo Contrast IMPRESSION: 1. Mild degenerative changes of the mid lumbar spine. 2. Moderate colonic stool burden, suggesting mild constipation.   [TY]    Clinical Course User Index [TY] Neysa Caron PARAS, DO                                 Medical Decision Making 89 year old female presenting emergency department for back pain.  She is afebrile nontachycardic, slightly hypertensive.  Physical exam reassuring.  Does not appear to be in acute distress.  Able to easily sit up in the bed for examination of her spine.  Equal strength in lower extremities.  She does not have evidence of acute cord compression.  She does follow with Ortho at [facility], had MRI last year with some stenosis.  Patient's  big screening labs with no leukocytosis to suggest infectious process.  No significant metabolic derangements.  UA no evidence of UTI.  Treated with multimodal pain medications with some improvement of her pain.  Offered TOC/PT OT evaluation and placement, but patient states she would like to go home and follow-up with her spine surgeon.  Lives with her daughter.  Patient and daughter are both agreeable to plan.  Will discharge in stable condition.  Amount and/or Complexity of Data Reviewed External Data Reviewed:     Details: MRI 12/27/23 at [facility] shows: 1. Mild right neuroforaminal stenosis L1-2. 2. Mild bilateral neuroforaminal stenosis L2-3. 3. Moderate left lateral recess stenosis, severe left neuroforaminal stenosis and mild right neuroforaminal stenosis L3-4. 4. Mild central canal stenosis, moderate bilateral lateral recess stenosis, severe left neuroforaminal stenosis and mild right neuroforaminal stenosis L4-5. 5. Mild central canal stenosis, mild bilateral lateral recess stenosis and mild bilateral neuroforaminal stenosis L5-S1.   Labs: ordered. Radiology: ordered. Decision-making details documented in ED Course.  Risk OTC drugs. Prescription drug management.       Final diagnoses:  Chronic bilateral low back pain with sciatica, sciatica laterality unspecified    ED Discharge Orders          Ordered    lidocaine  (LIDODERM ) 5 %  Every 24 hours        06/04/24 2239    gabapentin  (NEURONTIN ) 100 MG capsule  3 times daily        06/04/24 2239    traMADol  (ULTRAM ) 50 MG tablet  Every 6 hours PRN        06/04/24 2239               Neysa Caron PARAS, DO 06/04/24 2254

## 2024-06-14 ENCOUNTER — Other Ambulatory Visit: Payer: Self-pay | Admitting: Internal Medicine

## 2024-06-22 DIAGNOSIS — M461 Sacroiliitis, not elsewhere classified: Secondary | ICD-10-CM | POA: Diagnosis not present

## 2024-06-22 DIAGNOSIS — M791 Myalgia, unspecified site: Secondary | ICD-10-CM | POA: Diagnosis not present

## 2024-06-22 DIAGNOSIS — M5116 Intervertebral disc disorders with radiculopathy, lumbar region: Secondary | ICD-10-CM | POA: Diagnosis not present

## 2024-06-30 ENCOUNTER — Encounter

## 2024-07-01 ENCOUNTER — Encounter

## 2024-07-01 DIAGNOSIS — R351 Nocturia: Secondary | ICD-10-CM | POA: Diagnosis not present

## 2024-07-01 DIAGNOSIS — R350 Frequency of micturition: Secondary | ICD-10-CM | POA: Diagnosis not present

## 2024-07-01 DIAGNOSIS — N3941 Urge incontinence: Secondary | ICD-10-CM | POA: Diagnosis not present

## 2024-07-02 ENCOUNTER — Telehealth: Payer: Self-pay

## 2024-07-02 ENCOUNTER — Ambulatory Visit: Attending: Cardiology

## 2024-07-02 DIAGNOSIS — G459 Transient cerebral ischemic attack, unspecified: Secondary | ICD-10-CM

## 2024-07-02 NOTE — Telephone Encounter (Signed)
 Copied from CRM #[id number]. Topic: Referral - Request for Referral >> Jul 02, 2024  9:57 AM [name] wrote: Did the patient discuss referral with their provider in the last year? No (If No - schedule appointment) (If Yes - send message)  Appointment offered? Yes, but Urologist recommended to contact PCP to get a GI Dr.  [name] of order/referral and detailed reason for visit: Patient states that Urologist recommended to contact PCP to get referral to GI specialist. She states her colon has shrunk and experiences constipation frequently  Preference of office, provider, location: No preference, just wants a really good doctor-maybe Dr. [name]  If referral order, have you been seen by this specialty before? She thinks so, years ago Dr. Vivien (If Yes, this issue or another issue? When? Where?  Can we respond through MyChart? No  Patient callback is [phone] (home) and if she does not answer, please leave vm.

## 2024-07-03 LAB — CUP PACEART REMOTE DEVICE CHECK
Date Time Interrogation Session: 20251211231437
Implantable Pulse Generator Implant Date: 20231115

## 2024-07-05 NOTE — Telephone Encounter (Signed)
 Can you see if the pt would like to schedule an appt with me to discuss it first? She last saw GI in 2023 for this issue and so she shouldn't need a new referral.

## 2024-07-05 NOTE — Telephone Encounter (Signed)
 Spoke to pt daughter and they will come tomorrow 07/06/24 at 10:30.

## 2024-07-06 ENCOUNTER — Encounter: Payer: Self-pay | Admitting: Family Medicine

## 2024-07-06 ENCOUNTER — Ambulatory Visit: Admitting: Family Medicine

## 2024-07-06 VITALS — BP 106/61 | HR 74 | Ht 63.0 in | Wt 121.4 lb

## 2024-07-06 DIAGNOSIS — R195 Other fecal abnormalities: Secondary | ICD-10-CM | POA: Diagnosis not present

## 2024-07-06 NOTE — Progress Notes (Unsigned)
° °  Acute Office Visit  Subjective:     Patient ID: Cheryl Martin, female    DOB: 11/30/1930, 89 y.o.   MRN: [id number]  Chief Complaint  Patient presents with   Medical Management of Chronic Issues    HPI Patient is in today for   Subjective - Reports change in bowel habits, specifically very thin, ribbon-like stools. Describes them as thinner than a cord, but solid, not diarrhea. Has noticed them getting progressively smaller over time. - Reports daily bowel movements, sometimes twice a day. - Uses Miralax  nightly to keep bowels loose to avoid pain. If stools become hard, uses an over-the-counter stool softener ([employer] brand, red and white capsule, likely colace). - Last week, experienced constipation with some associated stomach pain. Used a suppository and an enema for relief. - Denies any blood in the stool, either red or dark/black. - Denies nausea or new stomach pains, other than the recent episode of constipation.  Medications - Solifenacin, recently started by another provider, reports it is working well. - Miralax  nightly. - Over-the-counter stool softener as needed.  PMHx, PSH, FH, Social Hx - Small bowel obstruction (SBO) in September 2024, requiring surgery and a 10-day hospitalization. Advised to avoid constipation post-operatively. - Last colonoscopy was in 2018, reported as normal with no significant findings mentioned. - Last seen by gastroenterology in 2023 for constipation.  ROS - GI: Reports change in stool caliber to very thin consistency. Reports constipation with associated abdominal pain last week, which resolved with a suppository and enema. Denies nausea, vomiting, or other abdominal pain. Denies hematochezia or melena. - GU: Reports taking Solifenacin for urinary symptoms, which is effective. - All other systems reviewed and are negative.   ROS      Objective:    BP 106/61   Pulse 74   Ht 5' 3 (1.6 m)   Wt 121 lb 6.4 oz (55.1 kg)   SpO2  99%   BMI 21.51 kg/m    Physical Exam Gen: alert, oriented Pulm: no respiratory distress Gi: soft, nontender. Nbs.   Psych: pleasant affect   No results found for any visits on 07/06/24.      Assessment & Plan:   Decreased stool caliber Assessment & Plan: The patient presents with a primary complaint of progressively thinning stools, like the diameter of a pencil. She has a history of a small bowel obstruction in 2024 and a normal colonoscopy in 2018. The change in stool caliber is concerning for anal stenosis, hemorrhoids, and constipation. malignancy is less likely given the recent normal colonoscopy.   - Plan:     - Trial of Metamucil once daily for one week to increase stool bulk and assess for improvement in stool caliber.     - If symptoms do not improve, will proceed with imaging such as barium enema     - If symptoms persist or worsen, will refer to Gastroenterology for further evaluation.      Return for on file.  Toribio MARLA Slain, MD

## 2024-07-06 NOTE — Patient Instructions (Signed)
 It was nice to see you today,  We addressed the following topics today: - Please try taking one scoop of Metamucil mixed with water once a day for one week to see if it helps make your stools larger and softer. - I will send in a order for the imaging.  Someone will call you to schedule this.  - The imaging is to look for any narrowing or blockages in your colon that could be causing the thin stools.  Have a great day,  Rolan Slain, MD

## 2024-07-07 ENCOUNTER — Encounter: Payer: Self-pay | Admitting: Neurology

## 2024-07-07 ENCOUNTER — Ambulatory Visit: Payer: Medicare HMO | Admitting: Neurology

## 2024-07-07 VITALS — BP 118/68 | HR 60

## 2024-07-07 DIAGNOSIS — G3184 Mild cognitive impairment, so stated: Secondary | ICD-10-CM | POA: Diagnosis not present

## 2024-07-07 DIAGNOSIS — G309 Alzheimer's disease, unspecified: Secondary | ICD-10-CM | POA: Diagnosis not present

## 2024-07-07 MED ORDER — DONEPEZIL HCL 10 MG PO TABS: 10.0000 mg | ORAL_TABLET | Freq: Every day | ORAL | 3 refills | Status: AC

## 2024-07-07 NOTE — Patient Instructions (Addendum)
 Increase Aricept  to 10 mg nightly  Continue your other medications Increase physical activities Follow-up in 1 year or sooner if worse
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
## 2024-07-08 DIAGNOSIS — R195 Other fecal abnormalities: Secondary | ICD-10-CM | POA: Insufficient documentation

## 2024-07-08 NOTE — Assessment & Plan Note (Signed)
 The patient presents with a primary complaint of progressively thinning stools, like the diameter of a pencil. She has a history of a small bowel obstruction in 2024 and a normal colonoscopy in 2018. The change in stool caliber is concerning for anal stenosis, hemorrhoids, and constipation. malignancy is less likely given the recent normal colonoscopy.   - Plan:     - Trial of Metamucil once daily for one week to increase stool bulk and assess for improvement in stool caliber.     - If symptoms do not improve, will proceed with imaging such as barium enema     - If symptoms persist or worsen, will refer to Gastroenterology for further evaluation.

## 2024-07-09 NOTE — Progress Notes (Addendum)
 Remote Loop Recorder Transmission

## 2024-07-12 DIAGNOSIS — M5116 Intervertebral disc disorders with radiculopathy, lumbar region: Secondary | ICD-10-CM | POA: Diagnosis not present

## 2024-07-12 DIAGNOSIS — M479 Spondylosis, unspecified: Secondary | ICD-10-CM | POA: Diagnosis not present

## 2024-07-12 DIAGNOSIS — M5416 Radiculopathy, lumbar region: Secondary | ICD-10-CM | POA: Diagnosis not present

## 2024-07-19 ENCOUNTER — Ambulatory Visit: Payer: Self-pay | Admitting: Cardiology

## 2024-07-20 ENCOUNTER — Other Ambulatory Visit: Payer: Self-pay | Admitting: Family Medicine

## 2024-07-20 ENCOUNTER — Telehealth: Payer: Self-pay

## 2024-07-20 MED ORDER — GEMTESA 75 MG PO TABS: 1.0000 | ORAL_TABLET | Freq: Every day | ORAL | 3 refills | Status: AC

## 2024-07-20 NOTE — Telephone Encounter (Signed)
 Copied from CRM [id number]. Topic: Clinical - Order For Equipment >> Jul 20, 2024  9:52 AM Antony RAMAN wrote: Reason for CRM: wanting to see if insurance would cover a shower chair, sent after jan 1

## 2024-07-20 NOTE — Telephone Encounter (Signed)
 Copied from CRM [id number]. Topic: Clinical - Medication Refill >> Jul 20, 2024  9:50 AM [name] wrote: Medication: Vibegron  (GEMTESA ) 75 MG TABS  Has the patient contacted their pharmacy? Yes (Agent: If no, request that the patient contact the pharmacy for the refill. If patient does not wish to contact the pharmacy document the reason why and proceed with request.) (Agent: If yes, when and what did the pharmacy advise?)  This is the patient's preferred pharmacy:  [id number] [place], [place] - [address] [place]  [address] Phone: [phone] Fax: [phone]  Is this the correct pharmacy for this prescription? Yes If no, delete pharmacy and type the correct one.   Has the prescription been filled recently? No  Is the patient out of the medication? Yes  Has the patient been seen for an appointment in the last year OR does the patient have an upcoming appointment? Yes  Can we respond through MyChart? No  Agent: Please be advised that Rx refills may take up to 3 business days. We ask that you follow-up with your pharmacy.

## 2024-07-23 ENCOUNTER — Other Ambulatory Visit: Payer: Self-pay | Admitting: Family Medicine

## 2024-07-23 ENCOUNTER — Telehealth: Payer: Self-pay

## 2024-07-23 DIAGNOSIS — M1712 Unilateral primary osteoarthritis, left knee: Secondary | ICD-10-CM

## 2024-07-23 MED ORDER — TRAMADOL HCL 50 MG PO TABS: 50.0000 mg | ORAL_TABLET | Freq: Four times a day (QID) | ORAL | 0 refills | Status: AC | PRN

## 2024-07-23 NOTE — Telephone Encounter (Signed)
 Copied from CRM #[id number]. Topic: Referral - Request for Referral >> Jul 23, 2024  9:00 AM Emylou G wrote: Daughter called.. in [place] to discussion for colonoscopy on moms appt w/Dr Chandra on 12/16 - was told she would get a phone to call to discuss colon issues?  Referral?  Pls call patient / mom back

## 2024-07-23 NOTE — Telephone Encounter (Signed)
 Copied from CRM #[id number]. Topic: Clinical - Medication Refill >> Jul 23, 2024  8:54 AM Emylou G wrote: Medication: traMADol  (ULTRAM ) 50 MG tablet  Has the patient contacted their pharmacy? No (Agent: If no, request that the patient contact the pharmacy for the refill. If patient does not wish to contact the pharmacy document the reason why and proceed with request.) (Agent: If yes, when and what did the pharmacy advise?)  This is the patient's preferred pharmacy:  [id number] [place], [place] - [address] [place] [address] Phone: [phone] Fax: [phone]   Is this the correct pharmacy for this prescription? Yes If no, delete pharmacy and type the correct one.   Has the prescription been filled recently? No  Is the patient out of the medication? Yes  Has the patient been seen for an appointment in the last year OR does the patient have an upcoming appointment? Yes  Can we respond through MyChart? No  Agent: Please be advised that Rx refills may take up to 3 business days. We ask that you follow-up with your pharmacy.

## 2024-07-23 NOTE — Telephone Encounter (Signed)
 Copied from CRM [id number]. Topic: Clinical - Medication Refill >> Jul 20, 2024  9:50 AM [name] wrote: Medication: Vibegron  (GEMTESA ) 75 MG TABS  Has the patient contacted their pharmacy? Yes (Agent: If no, request that the patient contact the pharmacy for the refill. If patient does not wish to contact the pharmacy document the reason why and proceed with request.) (Agent: If yes, when and what did the pharmacy advise?)  This is the patient's preferred pharmacy:  [id number] [place], [place] - [address] [place]  [address] Phone: [phone] Fax: [phone]  Is this the correct pharmacy for this prescription? Yes If no, delete pharmacy and type the correct one.   Has the prescription been filled recently? No  Is the patient out of the medication? Yes  Has the patient been seen for an appointment in the last year OR does the patient have an upcoming appointment? Yes  Can we respond through MyChart? No  Agent: Please be advised that Rx refills may take up to 3 business days. We ask that you follow-up with your pharmacy.

## 2024-07-23 NOTE — Telephone Encounter (Signed)
 Called patient spoke to daughter she stated that its a shower chair not tub and also a GI referral because her stools are sometimes ok and other days not

## 2024-07-23 NOTE — Telephone Encounter (Signed)
 Please call the pt and let them know I have sent in the order for the shower tub now that it is after jan 1st and also ask if her issues with her stool size have improved or not.

## 2024-07-25 ENCOUNTER — Other Ambulatory Visit: Payer: Self-pay | Admitting: Family Medicine

## 2024-07-28 ENCOUNTER — Encounter: Payer: Self-pay | Admitting: Cardiology

## 2024-07-31 ENCOUNTER — Encounter

## 2024-08-02 ENCOUNTER — Ambulatory Visit

## 2024-08-02 ENCOUNTER — Encounter

## 2024-08-03 ENCOUNTER — Telehealth: Payer: Self-pay

## 2024-08-03 NOTE — Telephone Encounter (Signed)
 Copied from CRM [id number]. Topic: Referral - Status >> Aug 03, 2024  8:19 AM Olam RAMAN wrote: Reason for CRM: Calling if Dr can ref pt to a dr for stomach pain. Dghter is calling upset she has been waiting for a gastro dr for pt and has not gotten any update. Pt has not gotten any better with stomach issues and might be worse. Dghtr will want someone to call her back asap for a Gastro Dr for pt and stated she has been calling for the past month with no response. Cb [phone]  Niels Dawn. Dhter stated if no referral, could at least give her some recommendations to someone she can call herself

## 2024-08-04 ENCOUNTER — Other Ambulatory Visit: Payer: Self-pay | Admitting: Family Medicine

## 2024-08-04 DIAGNOSIS — R195 Other fecal abnormalities: Secondary | ICD-10-CM

## 2024-08-04 DIAGNOSIS — R1012 Left upper quadrant pain: Secondary | ICD-10-CM

## 2024-08-04 NOTE — Telephone Encounter (Signed)
 Please let the pt know I have ordered a CT scan for her and sent in a referral to the gastroenterologist.  Someone will call regarding scheduling each of these.

## 2024-08-04 NOTE — Telephone Encounter (Signed)
 Called patient spoke to daughter she stated that she mention this two many and the issue has not been address so she made an appt to a GI doctor herself                                                                                                            .

## 2024-08-10 ENCOUNTER — Telehealth: Payer: Self-pay

## 2024-08-10 NOTE — Telephone Encounter (Signed)
 Pt returned Cheryl Martin call asking about Flu shot. Pt declines at this time

## 2024-08-10 NOTE — Telephone Encounter (Signed)
 Copied from CRM #[id number]. Topic: General - Other >> Aug 09, 2024  3:28 PM Fonda T wrote: Reason for CRM: Pt states she is returning call to office.   Per chart review, no notes seen in regards to a call from office today.  Pt states she is sure call came from primary care office.  Requesting a follow up call to [phone]  Aware of call back.

## 2024-08-22 ENCOUNTER — Encounter: Payer: Self-pay | Admitting: Family Medicine

## 2024-08-23 ENCOUNTER — Other Ambulatory Visit: Payer: Self-pay | Admitting: Family Medicine

## 2024-08-23 MED ORDER — CLOPIDOGREL BISULFATE 75 MG PO TABS: 75.0000 mg | ORAL_TABLET | Freq: Every day | ORAL | 3 refills | Status: AC

## 2024-08-31 ENCOUNTER — Encounter

## 2024-08-31 ENCOUNTER — Ambulatory Visit: Admitting: Cardiology

## 2024-09-02 ENCOUNTER — Ambulatory Visit

## 2024-09-09 ENCOUNTER — Ambulatory Visit: Admitting: Physician Assistant

## 2024-10-01 ENCOUNTER — Encounter

## 2024-10-03 ENCOUNTER — Ambulatory Visit

## 2024-10-12 ENCOUNTER — Ambulatory Visit: Admitting: Gastroenterology

## 2024-10-13 ENCOUNTER — Encounter: Admitting: Family Medicine

## 2024-11-01 ENCOUNTER — Encounter

## 2024-11-03 ENCOUNTER — Ambulatory Visit

## 2024-12-02 ENCOUNTER — Encounter

## 2024-12-04 ENCOUNTER — Ambulatory Visit

## 2025-03-10 ENCOUNTER — Ambulatory Visit

## 2025-07-07 ENCOUNTER — Ambulatory Visit: Admitting: Neurology
